# Patient Record
Sex: Female | Born: 1950
Health system: Southern US, Community
[De-identification: ages and names within clinical notes are randomized; demographics above are authoritative.]

## PROBLEM LIST (undated history)

## (undated) DIAGNOSIS — K5730 Diverticulosis of large intestine without perforation or abscess without bleeding: Secondary | ICD-10-CM

## (undated) DIAGNOSIS — F411 Generalized anxiety disorder: Secondary | ICD-10-CM

## (undated) DIAGNOSIS — R7989 Other specified abnormal findings of blood chemistry: Secondary | ICD-10-CM

## (undated) DIAGNOSIS — I341 Nonrheumatic mitral (valve) prolapse: Secondary | ICD-10-CM

## (undated) DIAGNOSIS — M2550 Pain in unspecified joint: Secondary | ICD-10-CM

## (undated) DIAGNOSIS — F3289 Other specified depressive episodes: Secondary | ICD-10-CM

## (undated) DIAGNOSIS — G4700 Insomnia, unspecified: Secondary | ICD-10-CM

## (undated) DIAGNOSIS — E7800 Pure hypercholesterolemia, unspecified: Secondary | ICD-10-CM

## (undated) DIAGNOSIS — M545 Low back pain, unspecified: Secondary | ICD-10-CM

## (undated) DIAGNOSIS — M1990 Unspecified osteoarthritis, unspecified site: Secondary | ICD-10-CM

## (undated) DIAGNOSIS — S82409A Unspecified fracture of shaft of unspecified fibula, initial encounter for closed fracture: Secondary | ICD-10-CM

## (undated) DIAGNOSIS — F329 Major depressive disorder, single episode, unspecified: Secondary | ICD-10-CM

## (undated) DIAGNOSIS — R609 Edema, unspecified: Secondary | ICD-10-CM

## (undated) DIAGNOSIS — M7660 Achilles tendinitis, unspecified leg: Secondary | ICD-10-CM

## (undated) DIAGNOSIS — K279 Peptic ulcer, site unspecified, unspecified as acute or chronic, without hemorrhage or perforation: Secondary | ICD-10-CM

## (undated) DIAGNOSIS — M797 Fibromyalgia: Secondary | ICD-10-CM

## (undated) DIAGNOSIS — H8109 Meniere's disease, unspecified ear: Secondary | ICD-10-CM

## (undated) HISTORY — DX: Unspecified fracture of shaft of unspecified fibula, initial encounter for closed fracture: S82.409A

## (undated) HISTORY — DX: Other specified depressive episodes: F32.89

## (undated) HISTORY — DX: Major depressive disorder, single episode, unspecified: F32.9

## (undated) HISTORY — DX: Generalized anxiety disorder: F41.1

## (undated) HISTORY — PX: SPINE SURGERY: SHX786

## (undated) HISTORY — DX: Insomnia, unspecified: G47.00

## (undated) HISTORY — DX: Meniere's disease, unspecified ear: H81.09

## (undated) HISTORY — DX: Pure hypercholesterolemia, unspecified: E78.00

## (undated) HISTORY — PX: SALPINGECTOMY: SHX328

## (undated) HISTORY — DX: Edema, unspecified: R60.9

## (undated) HISTORY — DX: Other specified abnormal findings of blood chemistry: R79.89

## (undated) HISTORY — PX: BACK SURGERY: SHX140

## (undated) HISTORY — DX: Low back pain: M54.5

## (undated) HISTORY — DX: Unspecified osteoarthritis, unspecified site: M19.90

## (undated) HISTORY — DX: Pain in unspecified joint: M25.50

## (undated) HISTORY — PX: TONSILLECTOMY: SUR1361

## (undated) HISTORY — DX: Diverticulosis of large intestine without perforation or abscess without bleeding: K57.30

## (undated) HISTORY — PX: OOPHORECTOMY: SHX86

## (undated) HISTORY — DX: Low back pain, unspecified: M54.50

## (undated) HISTORY — DX: Achilles tendinitis, unspecified leg: M76.60

## (undated) HISTORY — DX: Fibromyalgia: M79.7

## (undated) HISTORY — DX: Nonrheumatic mitral (valve) prolapse: I34.1

## (undated) HISTORY — DX: Peptic ulcer, site unspecified, unspecified as acute or chronic, without hemorrhage or perforation: K27.9

---

## 1980-03-08 HISTORY — PX: ABDOMINAL HYSTERECTOMY: SHX81

## 1984-03-08 DIAGNOSIS — H8109 Meniere's disease, unspecified ear: Secondary | ICD-10-CM

## 1984-03-08 HISTORY — DX: Meniere's disease, unspecified ear: H81.09

## 2004-03-08 LAB — HM COLONOSCOPY

## 2005-11-11 ENCOUNTER — Ambulatory Visit: Payer: Self-pay | Admitting: Internal Medicine

## 2005-11-16 ENCOUNTER — Ambulatory Visit: Payer: Self-pay | Admitting: Internal Medicine

## 2006-01-11 ENCOUNTER — Ambulatory Visit: Payer: Self-pay | Admitting: General Surgery

## 2007-03-26 ENCOUNTER — Encounter: Admission: RE | Admit: 2007-03-26 | Discharge: 2007-03-26 | Payer: Self-pay | Admitting: Neurological Surgery

## 2007-05-09 ENCOUNTER — Observation Stay (HOSPITAL_COMMUNITY): Admission: RE | Admit: 2007-05-09 | Discharge: 2007-05-10 | Payer: Self-pay | Admitting: Neurological Surgery

## 2008-06-12 ENCOUNTER — Ambulatory Visit: Payer: Self-pay | Admitting: Otolaryngology

## 2009-03-08 LAB — HM PAP SMEAR

## 2010-02-26 ENCOUNTER — Emergency Department: Payer: Self-pay | Admitting: Emergency Medicine

## 2010-07-24 NOTE — Op Note (Signed)
NAMESOLACE, MANWARREN NO.:  [id number]   MEDICAL RECORD NO.:  [id number]          PATIENT TYPE:  OBV   LOCATION:  3534                         FACILITY:  [facility]   PHYSICIAN:  Stefani Dama, M.D.  DATE OF BIRTH:  1950/09/27   DATE OF PROCEDURE:  05/25/2007  DATE OF DISCHARGE:  05/10/2007                               OPERATIVE REPORT   PREOPERATIVE DIAGNOSIS:  Cervical spondylosis with myelopathy and  cervical radiculopathy C5-C6.   POSTOPERATIVE DIAGNOSIS:  Cervical spondylosis with myelopathy and  cervical radiculopathy C5-C6.   PROCEDURE:  Anterior cervical decompression C5-C6, arthrodesis with  structural allograft and Alphatec plate fixation with a Tressel plate.   SURGEON:  Stefani Dama, M.D.   FIRST ASSISTANT:  Payton Doughty, M.D.   ANESTHESIA:  General endotracheal.   INDICATIONS:  Tracy Estrada is a 60 year old individual who has had  significant problems with neck, shoulder and arm pain and weakness in  the proximal upper extremities and the biceps. She was found to have a  large extruded fragment of disc material at C5-C6 causing spinal cord  compromise and some intrinsic cord changes.  She was advised regarding  the need for urgent surgery.   DESCRIPTION OF PROCEDURE:  The patient was brought to the operating room  and placed on the table in a supine position. After the smooth induction  of general endotracheal anesthesia, she was placed in 5 pounds of halter  traction and the neck was then prepped with alcohol and DuraPrep and  draped in a sterile fashion.  An incision was made on the left side in  the transverse plane.  Dissection was carried down through the platysma.  The plane between the sternocleidomastoid and strap muscles was  dissected bluntly until the first prevertebral space was reached.  This  was confirmed on radiograph as being C5-C6. Then, by dissecting the  longus coli muscle off either side of the midline, self-retaining  Caspar  retractor was placed in the wound and a discectomy was performed using a  15 blade to open the anterior longitudinal ligament and a combination of  curettes and rongeurs to remove significant quantity of severely  degenerated and desiccated material.  As the region of the posterior  longitudinal ligament was reached, there was noted to be a breech in the  ligament which was thickened and this breech was taken up with a 2 mm  Kerrison punch and yielded some significant free fragments of disc  material from the central portion of the canal. Dissection was carried  out laterally and some uncinate spurring was encountered, more on the  right side than on the left side.  This was removed. Dissection was  carried out completely to release any ingrowth onto the exiting nerve  roots at C6 at this level.  Once both nerve roots were well  decompressed, hemostasis in the soft tissues and the prevertebral space  was achieved with some small pledgets of Gelfoam soaked in thrombin  which were later irrigated away. The endplates of the disc space were  then smoothed with a 5 mm oval  bur.  Once this was prepared, then an 8  mm transgraft was prepared by shaving the ends to appropriate size and  dimension and filling the interspace with some demineralized bone  matrix. The graft was then inserted into the interspace.  Traction was  relieved and then a small 16 mm plate was affixed to the ventral aspect  of the vertebral bodies with four locking 4 x 14 mm variable angle  screws.  Hemostasis in the soft tissues was obtained.  The area was  irrigated copiously with antibiotic  irrigating solution.  The platysma was closed with 3-0 Vicryl in an  interrupted fashion, 3-0 Vicryl was used subcuticularly to close the  skin.  The patient tolerated the procedure well and was returned to the  recovery room in stable condition.      Stefani Dama, M.D.  Electronically Signed     HJE/MEDQ  D:   05/25/2007  T:  05/25/2007  Job:  [id number]

## 2010-11-27 LAB — CBC
HCT: 41.5
Hemoglobin: 14.2
MCHC: 34.2
MCV: 97.1
RBC: 4.27
RDW: 12

## 2010-11-30 LAB — C-REACTIVE PROTEIN: CRP: 0.3 — ABNORMAL LOW (ref <0.6)

## 2011-01-14 ENCOUNTER — Ambulatory Visit: Payer: Self-pay | Admitting: Family Medicine

## 2011-09-13 LAB — HM MAMMOGRAPHY: HM Mammogram: NEGATIVE

## 2011-11-30 ENCOUNTER — Encounter: Payer: Self-pay | Admitting: *Deleted

## 2011-11-30 ENCOUNTER — Encounter: Payer: Self-pay | Admitting: Family Medicine

## 2013-06-06 DIAGNOSIS — M1710 Unilateral primary osteoarthritis, unspecified knee: Secondary | ICD-10-CM | POA: Diagnosis not present

## 2013-06-06 DIAGNOSIS — IMO0002 Reserved for concepts with insufficient information to code with codable children: Secondary | ICD-10-CM | POA: Diagnosis not present

## 2013-06-11 DIAGNOSIS — E559 Vitamin D deficiency, unspecified: Secondary | ICD-10-CM | POA: Diagnosis not present

## 2013-06-11 DIAGNOSIS — M1710 Unilateral primary osteoarthritis, unspecified knee: Secondary | ICD-10-CM | POA: Diagnosis not present

## 2013-06-11 DIAGNOSIS — M461 Sacroiliitis, not elsewhere classified: Secondary | ICD-10-CM | POA: Diagnosis not present

## 2013-06-11 DIAGNOSIS — R5383 Other fatigue: Secondary | ICD-10-CM | POA: Diagnosis not present

## 2013-06-11 DIAGNOSIS — IMO0001 Reserved for inherently not codable concepts without codable children: Secondary | ICD-10-CM | POA: Diagnosis not present

## 2013-06-11 DIAGNOSIS — Z79899 Other long term (current) drug therapy: Secondary | ICD-10-CM | POA: Diagnosis not present

## 2013-06-11 DIAGNOSIS — R5381 Other malaise: Secondary | ICD-10-CM | POA: Diagnosis not present

## 2013-06-16 ENCOUNTER — Ambulatory Visit (INDEPENDENT_AMBULATORY_CARE_PROVIDER_SITE_OTHER): Payer: Medicare Other | Admitting: Family Medicine

## 2013-06-16 VITALS — BP 118/62 | HR 70 | Temp 98.0°F | Resp 18 | Ht 61.0 in | Wt 125.8 lb

## 2013-06-16 DIAGNOSIS — R102 Pelvic and perineal pain: Secondary | ICD-10-CM

## 2013-06-16 DIAGNOSIS — N949 Unspecified condition associated with female genital organs and menstrual cycle: Secondary | ICD-10-CM

## 2013-06-16 DIAGNOSIS — R55 Syncope and collapse: Secondary | ICD-10-CM | POA: Diagnosis not present

## 2013-06-16 DIAGNOSIS — R110 Nausea: Secondary | ICD-10-CM | POA: Diagnosis not present

## 2013-06-16 DIAGNOSIS — R300 Dysuria: Secondary | ICD-10-CM | POA: Diagnosis not present

## 2013-06-16 LAB — POCT CBC
GRANULOCYTE PERCENT: 63 % (ref 37–80)
HCT, POC: 40.4 % (ref 37.7–47.9)
Hemoglobin: 12.8 g/dL (ref 12.2–16.2)
Lymph, poc: 1.3 (ref 0.6–3.4)
MCH: 32 pg — AB (ref 27–31.2)
MCHC: 31.7 g/dL — AB (ref 31.8–35.4)
MCV: 101 fL — AB (ref 80–97)
MID (CBC): 0.3 (ref 0–0.9)
MPV: 10.4 fL (ref 0–99.8)
PLATELET COUNT, POC: 188 10*3/uL (ref 142–424)
POC Granulocyte: 2.8 (ref 2–6.9)
POC LYMPH PERCENT: 29.4 %L (ref 10–50)
POC MID %: 7.6 % (ref 0–12)
RBC: 4 M/uL — AB (ref 4.04–5.48)
RDW, POC: 12.9 %
WBC: 4.4 10*3/uL — AB (ref 4.6–10.2)

## 2013-06-16 LAB — POCT URINALYSIS DIPSTICK
Bilirubin, UA: NEGATIVE
Blood, UA: NEGATIVE
Glucose, UA: NEGATIVE
Ketones, UA: NEGATIVE
Nitrite, UA: NEGATIVE
PH UA: 5.5
PROTEIN UA: NEGATIVE
UROBILINOGEN UA: 0.2

## 2013-06-16 LAB — POCT UA - MICROSCOPIC ONLY
CASTS, UR, LPF, POC: NEGATIVE
CRYSTALS, UR, HPF, POC: NEGATIVE
Mucus, UA: POSITIVE
YEAST UA: NEGATIVE

## 2013-06-16 LAB — COMPREHENSIVE METABOLIC PANEL
ALK PHOS: 39 U/L (ref 39–117)
ALT: 15 U/L (ref 0–35)
AST: 16 U/L (ref 0–37)
Albumin: 4.1 g/dL (ref 3.5–5.2)
BILIRUBIN TOTAL: 0.5 mg/dL (ref 0.2–1.2)
BUN: 18 mg/dL (ref 6–23)
CO2: 28 meq/L (ref 19–32)
CREATININE: 0.63 mg/dL (ref 0.50–1.10)
Calcium: 9.9 mg/dL (ref 8.4–10.5)
Chloride: 103 mEq/L (ref 96–112)
Glucose, Bld: 77 mg/dL (ref 70–99)
Potassium: 4.3 mEq/L (ref 3.5–5.3)
SODIUM: 139 meq/L (ref 135–145)
TOTAL PROTEIN: 6.4 g/dL (ref 6.0–8.3)

## 2013-06-16 LAB — GLUCOSE, POCT (MANUAL RESULT ENTRY): POC Glucose: 77 mg/dl (ref 70–99)

## 2013-06-16 MED ORDER — CIPROFLOXACIN HCL 500 MG PO TABS: 500.0000 mg | ORAL_TABLET | Freq: Two times a day (BID) | ORAL | Status: DC

## 2013-06-16 NOTE — Patient Instructions (Signed)
Syncope  Syncope is a fainting spell. This means the person loses consciousness and drops to the ground. The person is generally unconscious for less than 5 minutes. The person may have some muscle twitches for up to 15 seconds before waking up and returning to normal. Syncope occurs more often in elderly people, but it can happen to anyone. While most causes of syncope are not dangerous, syncope can be a sign of a serious medical problem. It is important to seek medical care.   CAUSES   Syncope is caused by a sudden decrease in blood flow to the brain. The specific cause is often not determined. Factors that can trigger syncope include:   Taking medicines that lower blood pressure.   Sudden changes in posture, such as standing up suddenly.   Taking more medicine than prescribed.   Standing in one place for too long.   Seizure disorders.   Dehydration and excessive exposure to heat.   Low blood sugar (hypoglycemia).   Straining to have a bowel movement.   Heart disease, irregular heartbeat, or other circulatory problems.   Fear, emotional distress, seeing blood, or severe pain.  SYMPTOMS   Right before fainting, you may:   Feel dizzy or lightheaded.   Feel nauseous.   See all white or all black in your field of vision.   Have cold, clammy skin.  DIAGNOSIS   Your caregiver will ask about your symptoms, perform a physical exam, and perform electrocardiography (ECG) to record the electrical activity of your heart. Your caregiver may also perform other heart or blood tests to determine the cause of your syncope.  TREATMENT   In most cases, no treatment is needed. Depending on the cause of your syncope, your caregiver may recommend changing or stopping some of your medicines.  HOME CARE INSTRUCTIONS   Have someone stay with you until you feel stable.   Do not drive, operate machinery, or play sports until your caregiver says it is okay.   Keep all follow-up appointments as directed by your  caregiver.   Lie down right away if you start feeling like you might faint. Breathe deeply and steadily. Wait until all the symptoms have passed.   Drink enough fluids to keep your urine clear or pale yellow.   If you are taking blood pressure or heart medicine, get up slowly, taking several minutes to sit and then stand. This can reduce dizziness.  SEEK IMMEDIATE MEDICAL CARE IF:    You have a severe headache.   You have unusual pain in the chest, abdomen, or back.   You are bleeding from the mouth or rectum, or you have black or tarry stool.   You have an irregular or very fast heartbeat.   You have pain with breathing.   You have repeated fainting or seizure-like jerking during an episode.   You faint when sitting or lying down.   You have confusion.   You have difficulty walking.   You have severe weakness.   You have vision problems.  If you fainted, call your local emergency services (911 in [place] Do not drive yourself to the hospital.   MAKE SURE YOU:   Understand these instructions.   Will watch your condition.   Will get help right away if you are not doing well or get worse.  Document Released: 02/22/2005 Document Revised: 08/24/2011 Document Reviewed: 04/23/2011  [employer] Patient Information 2014 [employer].

## 2013-06-16 NOTE — Progress Notes (Addendum)
Subjective:    Patient ID: Breland Elders, female    DOB: 1950-10-16, 63 y.o.   MRN: [id number]  Loss of Consciousness Associated symptoms include headaches. Pertinent negatives include no abdominal pain, chest pain, diaphoresis, dizziness, fever, light-headedness, nausea, palpitations, vomiting or weakness.  Urinary Frequency  Associated symptoms include frequency. Pertinent negatives include no chills, hematuria, nausea, urgency or vomiting.   Chief Complaint  Patient presents with  . Loss of Consciousness  . Urinary Frequency    burning and odor   This chart was scribed for Wardell Honour, MD by Thea Alken, ED Scribe. This patient was seen in room 14 and the patient's care was started at 2:34 PM.  HPI Comments: Bethany Hirt is a 63 y.o. female who presents to the [facility] complaining of syncope onset 2 day ago. Pt reports that she was using the restroom when she past out. Pt reports that she felt fine prior to syncope episode. Pt reports HA and wooziness prior to syncope episode. She denies hitting her head. Pt denies dizziness, numbness, tingling, CP, SOB, or nausea prior/post syncope episode. Pt denies congestion and sinus pressure. Pt was driven to clinic by husband.  Pt reports that she has suffered syncope in the past.  Pt also complains of a UTI with associted minor dysuria onset 1 month. Pt reports left groin pain onset 2 weeks ago. Pt reports that her urine has an odor. Pt denies blood in urine. Pt reports that she has minor vaginal discharge. Pt states that she has about 3 BM a day.   Pt has h/o hysterectomy. Pt reports she stills has her ovaries. Pt reports mother is 69. Pt reports that her mother has difficulty walking. She denies her mother having HTN. Pt reports father died at 55 from MI. Pt reports that he passed in 1995 and had his first MI in 1969. Pt reports sister who passed from breast cancer. Pt denies brother. Pt denies smoking but states she is  a social drinker.     Past Medical History  Diagnosis Date  . Lumbago   . Edema   . Pure hypercholesterolemia   . Closed fracture of unspecified part of fibula   . Insomnia, unspecified   . Anxiety state, unspecified   . Depressive disorder, not elsewhere classified   . Achilles bursitis or tendinitis   . Pain in joint, site unspecified   . MVP (mitral valve prolapse)   . Fibromyalgia   . Peptic ulcer     no testing  . Arthritis   . Meniere's disease 1986  . Diverticulosis of colon (without mention of hemorrhage)   . TSH elevation    Allergies  Allergen Reactions  . Demerol [Meperidine] Nausea And Vomiting   Prior to Admission medications   Medication Sig Start Date End Date Taking? Authorizing Provider  buPROPion (WELLBUTRIN XL) 150 MG 24 hr tablet Take 150 mg by mouth daily.   Yes Historical Provider, MD  calcium-vitamin D (OSCAL WITH D) 500-200 MG-UNIT per tablet Take 2 tablets by mouth daily.   Yes Historical Provider, MD  cholecalciferol (VITAMIN D) 1000 UNITS tablet Take 1,000 Units by mouth 2 (two) times daily.   Yes Historical Provider, MD  cyclobenzaprine (FLEXERIL) 10 MG tablet Take 10 mg by mouth daily.   Yes Historical Provider, MD  diclofenac sodium (VOLTAREN) 1 % GEL Apply topically 4 (four) times daily.   Yes Historical Provider, MD  etodolac (LODINE) 300 MG capsule Take 300 mg  by mouth every 8 (eight) hours.   Yes Historical Provider, MD  fluticasone (FLONASE) 50 MCG/ACT nasal spray Place 2 sprays into the nose daily.   Yes Historical Provider, MD  Ginger, Zingiber officinalis, (GINGER ROOT) 500 MG CAPS Take 500 mg by mouth.   Yes Historical Provider, MD  meloxicam (MOBIC) 15 MG tablet Take 15 mg by mouth daily.   Yes Historical Provider, MD  Misc Natural Products (TART CHERRY ADVANCED PO) Take by mouth.   Yes Historical Provider, MD  Multiple Vitamins-Minerals (MULTIVITAMIN WITH MINERALS) tablet Take 1 tablet by mouth daily.   Yes Historical Provider, MD    Omega-3 Fatty Acids (FISH OIL) 1000 MG CAPS Take 1,000 mg by mouth.   Yes Historical Provider, MD  omeprazole (PRILOSEC) 40 MG capsule Take 40 mg by mouth daily.   Yes Historical Provider, MD  traZODone (DESYREL) 50 MG tablet Take 50 mg by mouth at bedtime.   Yes Historical Provider, MD   Review of Systems  Constitutional: Negative for fever, chills, diaphoresis, activity change, appetite change, fatigue and unexpected weight change.  HENT: Negative for congestion, ear pain, rhinorrhea, sinus pressure and sore throat.   Eyes: Negative for photophobia and visual disturbance.  Respiratory: Negative for cough, shortness of breath, wheezing and stridor.   Cardiovascular: Positive for syncope. Negative for chest pain, palpitations and leg swelling.  Gastrointestinal: Negative for nausea, vomiting, abdominal pain, diarrhea, constipation, blood in stool, abdominal distention, anal bleeding and rectal pain.  Genitourinary: Positive for dysuria, frequency and vaginal discharge. Negative for urgency, hematuria, vaginal bleeding and vaginal pain.  Skin: Negative for color change and rash.  Neurological: Positive for syncope and headaches. Negative for dizziness, tremors, seizures, facial asymmetry, speech difficulty, weakness, light-headedness and numbness.      Objective:   Physical Exam  Constitutional: She is oriented to person, place, and time. She appears well-developed and well-nourished. No distress.  HENT:  Head: Normocephalic and atraumatic.  Right Ear: External ear normal.  Left Ear: External ear normal.  Nose: Nose normal.  Mouth/Throat: Oropharynx is clear and moist.  Eyes: Conjunctivae and EOM are normal. Pupils are equal, round, and reactive to light.  Neck: Normal range of motion. Neck supple. Carotid bruit is not present. No thyromegaly present.  Cardiovascular: Normal rate, regular rhythm, normal heart sounds and intact distal pulses.  Exam reveals no gallop and no friction rub.    No murmur heard. Pulmonary/Chest: Effort normal and breath sounds normal. No respiratory distress. She has no wheezes. She has no rales.  Abdominal: Soft. Bowel sounds are normal. She exhibits no distension and no mass. There is tenderness in the suprapubic area and left lower quadrant. There is no rebound and no guarding.  Genitourinary: There is no rash, tenderness or lesion on the right labia. There is no rash, tenderness or lesion on the left labia. Right adnexum displays no mass, no tenderness and no fullness. Left adnexum displays no mass, no tenderness and no fullness. Vaginal discharge found.  Very minimal vaginal discharge.  Musculoskeletal: Normal range of motion.  Lymphadenopathy:    She has no cervical adenopathy.  Neurological: She is alert and oriented to person, place, and time. She has normal reflexes. No cranial nerve deficit. She exhibits normal muscle tone. Coordination normal.  Skin: Skin is warm and dry. No rash noted. She is not diaphoretic. No erythema. No pallor.  Psychiatric: She has a normal mood and affect. Her behavior is normal. Judgment and thought content normal.  Nursing note and  vitals reviewed.  Results for orders placed in visit on 06/16/13  POCT CBC      Result Value Ref Range   WBC 4.4 (*) 4.6 - 10.2 K/uL   Lymph, poc 1.3  0.6 - 3.4   POC LYMPH PERCENT 29.4  10 - 50 %L   MID (cbc) 0.3  0 - 0.9   POC MID % 7.6  0 - 12 %M   POC Granulocyte 2.8  2 - 6.9   Granulocyte percent 63.0  37 - 80 %G   RBC 4.00 (*) 4.04 - 5.48 M/uL   Hemoglobin 12.8  12.2 - 16.2 g/dL   HCT, POC 40.4  37.7 - 47.9 %   MCV 101.0 (*) 80 - 97 fL   MCH, POC 32.0 (*) 27 - 31.2 pg   MCHC 31.7 (*) 31.8 - 35.4 g/dL   RDW, POC 12.9     Platelet Count, POC 188  142 - 424 K/uL   MPV 10.4  0 - 99.8 fL  GLUCOSE, POCT (MANUAL RESULT ENTRY)      Result Value Ref Range   POC Glucose 77  70 - 99 mg/dl  POCT URINALYSIS DIPSTICK      Result Value Ref Range   Color, UA yellow     Clarity,  UA slightly cloudy     Glucose, UA neg     Bilirubin, UA neg     Ketones, UA neg     Spec Grav, UA >=1.030     Blood, UA neg     pH, UA 5.5     Protein, UA neg     Urobilinogen, UA 0.2     Nitrite, UA neg     Leukocytes, UA Trace    POCT UA - MICROSCOPIC ONLY      Result Value Ref Range   WBC, Ur, HPF, POC 4-8     RBC, urine, microscopic 2-3     Bacteria, U Microscopic 4+     Mucus, UA pos     Epithelial cells, urine per micros 1-2     Crystals, Ur, HPF, POC neg     Casts, Ur, LPF, POC neg     Yeast, UA neg     EKG: NSR; no arrhythmia; no ST changes.    Assessment & Plan:    1. Syncope   2. Dysuria   3. Nausea alone   4. Acute pelvic pain, female     1. Syncopal event:  New. No associated symptom; most consistent with vasovagal event.  Due to age, refer to cardiology to rule out arrhythmia or primary cardiac etiology.  To ED for recurrence.   Encourage hydration due to concentrated urine. 2.  Dysuria: New. Send urine culture; treat with Cipro while awaiting results. 3.  Nausea: New.  Obtain labs; BRAT diet, hydration.  If persists or worsens, RTC. 4.   Pelvic pain: New.  Associated with dysuria; contact office if persists after treating UTI.   Meds ordered this encounter  Medications  . Misc Natural Products (TART CHERRY ADVANCED PO)    Sig: Take by mouth.  Marland Kitchen omeprazole (PRILOSEC) 40 MG capsule    Sig: Take 40 mg by mouth daily.  . diclofenac sodium (VOLTAREN) 1 % GEL    Sig: Apply topically 4 (four) times daily.  Marland Kitchen etodolac (LODINE) 300 MG capsule    Sig: Take 300 mg by mouth every 8 (eight) hours.  . Ginger, Zingiber officinalis, (GINGER ROOT) 500 MG CAPS  Sig: Take 500 mg by mouth.  . Omega-3 Fatty Acids (FISH OIL) 1000 MG CAPS    Sig: Take 1,000 mg by mouth.  . ciprofloxacin (CIPRO) 500 MG tablet    Sig: Take 1 tablet (500 mg total) by mouth 2 (two) times daily.    Dispense:  20 tablet    Refill:  0   I personally performed the services described in  this documentation, which was scribed in my presence.  The recorded information has been reviewed and is accurate.  [name], M.D.  [facility] [address] [place], [place]  [address] [phone] phone [phone] fax

## 2013-06-19 LAB — URINE CULTURE: Colony Count: >=100000

## 2013-06-21 ENCOUNTER — Encounter: Payer: Self-pay | Admitting: Family Medicine

## 2013-06-21 DIAGNOSIS — R55 Syncope and collapse: Secondary | ICD-10-CM | POA: Diagnosis not present

## 2013-06-22 ENCOUNTER — Telehealth: Payer: Self-pay | Admitting: Family Medicine

## 2013-06-22 NOTE — Telephone Encounter (Signed)
Patient states that MyChart indicates that she overdue for a shingles shot however patient states that she had that done in July 2013 (former [place] patient). Please update if necessary.

## 2013-06-22 NOTE — Telephone Encounter (Signed)
Updated Zostavax in patient's chart.

## 2013-07-09 DIAGNOSIS — R079 Chest pain, unspecified: Secondary | ICD-10-CM | POA: Diagnosis not present

## 2013-07-10 DIAGNOSIS — R079 Chest pain, unspecified: Secondary | ICD-10-CM | POA: Diagnosis not present

## 2013-09-11 ENCOUNTER — Encounter: Payer: Self-pay | Admitting: Family Medicine

## 2013-09-11 MED ORDER — BUPROPION HCL ER (XL) 150 MG PO TB24: 150.0000 mg | ORAL_TABLET | Freq: Every day | ORAL | Status: DC

## 2013-10-29 DIAGNOSIS — L821 Other seborrheic keratosis: Secondary | ICD-10-CM | POA: Diagnosis not present

## 2013-10-29 DIAGNOSIS — D485 Neoplasm of uncertain behavior of skin: Secondary | ICD-10-CM | POA: Diagnosis not present

## 2013-10-29 DIAGNOSIS — L570 Actinic keratosis: Secondary | ICD-10-CM | POA: Diagnosis not present

## 2013-11-19 DIAGNOSIS — Z23 Encounter for immunization: Secondary | ICD-10-CM | POA: Diagnosis not present

## 2014-01-08 DIAGNOSIS — M25562 Pain in left knee: Secondary | ICD-10-CM | POA: Diagnosis not present

## 2014-01-08 DIAGNOSIS — E559 Vitamin D deficiency, unspecified: Secondary | ICD-10-CM | POA: Diagnosis not present

## 2014-01-08 DIAGNOSIS — Z79899 Other long term (current) drug therapy: Secondary | ICD-10-CM | POA: Diagnosis not present

## 2014-01-08 DIAGNOSIS — M797 Fibromyalgia: Secondary | ICD-10-CM | POA: Diagnosis not present

## 2014-01-08 DIAGNOSIS — M170 Bilateral primary osteoarthritis of knee: Secondary | ICD-10-CM | POA: Diagnosis not present

## 2014-01-08 DIAGNOSIS — M25561 Pain in right knee: Secondary | ICD-10-CM | POA: Diagnosis not present

## 2014-01-08 DIAGNOSIS — R5381 Other malaise: Secondary | ICD-10-CM | POA: Diagnosis not present

## 2014-01-08 DIAGNOSIS — R300 Dysuria: Secondary | ICD-10-CM | POA: Diagnosis not present

## 2014-02-07 ENCOUNTER — Ambulatory Visit (INDEPENDENT_AMBULATORY_CARE_PROVIDER_SITE_OTHER): Payer: Medicare Other

## 2014-02-07 ENCOUNTER — Ambulatory Visit (INDEPENDENT_AMBULATORY_CARE_PROVIDER_SITE_OTHER): Payer: Medicare Other | Admitting: Family Medicine

## 2014-02-07 ENCOUNTER — Encounter: Payer: Self-pay | Admitting: Family Medicine

## 2014-02-07 VITALS — BP 116/70 | HR 68 | Temp 98.1°F | Resp 16 | Ht 63.5 in | Wt 126.8 lb

## 2014-02-07 DIAGNOSIS — M545 Low back pain, unspecified: Secondary | ICD-10-CM

## 2014-02-07 DIAGNOSIS — L237 Allergic contact dermatitis due to plants, except food: Secondary | ICD-10-CM

## 2014-02-07 DIAGNOSIS — Z87440 Personal history of urinary (tract) infections: Secondary | ICD-10-CM

## 2014-02-07 DIAGNOSIS — Z1239 Encounter for other screening for malignant neoplasm of breast: Secondary | ICD-10-CM

## 2014-02-07 LAB — POCT UA - MICROSCOPIC ONLY
BACTERIA, U MICROSCOPIC: NEGATIVE
CRYSTALS, UR, HPF, POC: NEGATIVE
Casts, Ur, LPF, POC: NEGATIVE
MUCUS UA: NEGATIVE
RBC, URINE, MICROSCOPIC: NEGATIVE
WBC, UR, HPF, POC: NEGATIVE
Yeast, UA: NEGATIVE

## 2014-02-07 LAB — POCT URINALYSIS DIPSTICK
BILIRUBIN UA: NEGATIVE
Blood, UA: NEGATIVE
Glucose, UA: NEGATIVE
KETONES UA: NEGATIVE
LEUKOCYTES UA: NEGATIVE
Nitrite, UA: NEGATIVE
PH UA: 5.5
PROTEIN UA: NEGATIVE
SPEC GRAV UA: 1.02
Urobilinogen, UA: 0.2

## 2014-02-07 MED ORDER — CIPROFLOXACIN HCL 500 MG PO TABS: 500.0000 mg | ORAL_TABLET | Freq: Two times a day (BID) | ORAL | Status: DC

## 2014-02-07 MED ORDER — PREDNISONE 20 MG PO TABS: ORAL_TABLET | ORAL | Status: DC

## 2014-02-07 NOTE — Progress Notes (Signed)
Subjective:    Patient ID: Tracy Estrada, female    DOB: 07/28/1950, 63 y.o.   MRN: [id number]  02/07/2014  Rash and Back Pain   HPI This 63 y.o. female presents for evaluation of rash.  Developed two days ago.  B anterior neck, hands, forearms.    Lower back:  OA hands, knees, feet.  Has been taking AZO with improvement in lower back pain.  Onset one month ago.  Worse with movement and lying down at night.  No dysuria, urgency, hematuria, nocturia.  No fever/chill/sweats. +nausea but no vomiting.   No previous lumbar spine xrays by [facility].  Known fibromyalgia and known OA in multiple joints; concerned about DDD lumbar spine. Denies radiation into legs; no n/t/w.  No saddle paresthesias; no b/b dysfunction.  No recurrent syncopal events since last visit.  Requesting referral for  Mammogram.   Review of Systems  Constitutional: Negative for fever, chills, diaphoresis and fatigue.  Respiratory: Negative for cough and shortness of breath.   Gastrointestinal: Positive for nausea. Negative for vomiting, abdominal pain, diarrhea and constipation.  Genitourinary: Positive for flank pain. Negative for dysuria, urgency, frequency, hematuria, vaginal discharge, enuresis, difficulty urinating, genital sores, vaginal pain and dyspareunia.  Musculoskeletal: Positive for back pain.  Skin: Positive for rash.  Neurological: Negative for weakness and numbness.    Past Medical History  Diagnosis Date  . Lumbago   . Edema   . Pure hypercholesterolemia   . Closed fracture of unspecified part of fibula   . Insomnia, unspecified   . Anxiety state, unspecified   . Depressive disorder, not elsewhere classified   . Achilles bursitis or tendinitis   . Pain in joint, site unspecified   . MVP (mitral valve prolapse)   . Fibromyalgia   . Peptic ulcer     no testing  . Arthritis   . Meniere's disease 1986  . Diverticulosis of colon (without mention of hemorrhage)   . TSH elevation    Past  Surgical History  Procedure Laterality Date  . Abdominal hysterectomy  03/08/1980    fibroids; ovaries intact.   Allergies  Allergen Reactions  . Demerol [Meperidine] Nausea And Vomiting   Current Outpatient Prescriptions  Medication Sig Dispense Refill  . buPROPion (WELLBUTRIN XL) 150 MG 24 hr tablet Take 1 tablet (150 mg total) by mouth daily. 30 tablet 5  . calcium-vitamin D (OSCAL WITH D) 500-200 MG-UNIT per tablet Take 2 tablets by mouth daily.    . diclofenac sodium (VOLTAREN) 1 % GEL Apply topically 4 (four) times daily.    . fluticasone (FLONASE) 50 MCG/ACT nasal spray Place 2 sprays into the nose daily.    . Ginger, Zingiber officinalis, (GINGER ROOT) 500 MG CAPS Take 500 mg by mouth.    . Misc Natural Products (TART CHERRY ADVANCED PO) Take by mouth.    . Multiple Vitamins-Minerals (MULTIVITAMIN WITH MINERALS) tablet Take 1 tablet by mouth daily.    . Omega-3 Fatty Acids (FISH OIL) 1000 MG CAPS Take 1,000 mg by mouth.    . traZODone (DESYREL) 50 MG tablet Take 50 mg by mouth at bedtime.    . cholecalciferol (VITAMIN D) 1000 UNITS tablet Take 1,000 Units by mouth 2 (two) times daily.    . ciprofloxacin (CIPRO) 500 MG tablet Take 1 tablet (500 mg total) by mouth 2 (two) times daily. 14 tablet 0  . predniSONE (DELTASONE) 20 MG tablet Three tablets daily x 2 days, then two tablets daily x 5 days, then one  tablet daily x 3 days 19 tablet 0   No current facility-administered medications for this visit.       Objective:    BP 116/70 mmHg  Pulse 68  Temp(Src) 98.1 F (36.7 C) (Oral)  Resp 16  Ht 5' 3.5" (1.613 m)  Wt 126 lb 12.8 oz (57.516 kg)  BMI 22.11 kg/m2  SpO2 99% Physical Exam  Constitutional: She is oriented to person, place, and time. She appears well-developed and well-nourished. No distress.  HENT:  Head: Normocephalic and atraumatic.  Eyes: Conjunctivae are normal. Pupils are equal, round, and reactive to light.  Neck: Normal range of motion. Neck supple.    Cardiovascular: Normal rate, regular rhythm and normal heart sounds.  Exam reveals no gallop and no friction rub.   No murmur heard. Pulmonary/Chest: Effort normal and breath sounds normal. She has no wheezes. She has no rales.  Abdominal: Soft. Bowel sounds are normal. She exhibits no distension. There is no tenderness. There is no rebound.  Musculoskeletal:       Lumbar back: She exhibits normal range of motion, no tenderness, no bony tenderness, no pain and no spasm.  Lumbar spine: straight leg raises negative; toe and heel walking intact; marching intact.  Non-tender midline or paraspinal muscles B.  Neurological: She is alert and oriented to person, place, and time.  Skin: She is not diaphoretic.  Psychiatric: She has a normal mood and affect. Her behavior is normal.  Nursing note and vitals reviewed.  Results for orders placed or performed in visit on 02/07/14  POCT UA - Microscopic Only  Result Value Ref Range   WBC, Ur, HPF, POC neg    RBC, urine, microscopic neg    Bacteria, U Microscopic neg    Mucus, UA neg    Epithelial cells, urine per micros 0-2    Crystals, Ur, HPF, POC neg    Casts, Ur, LPF, POC neg    Yeast, UA neg   POCT urinalysis dipstick  Result Value Ref Range   Color, UA yellow    Clarity, UA clear    Glucose, UA neg    Bilirubin, UA neg    Ketones, UA neg    Spec Grav, UA 1.020    Blood, UA neg    pH, UA 5.5    Protein, UA neg    Urobilinogen, UA 0.2    Nitrite, UA neg    Leukocytes, UA Negative    [facility] reading (PRIMARY) by  Dr. Tamala Julian.  Lumbar spine: NAD      Assessment & Plan:   1. Bilateral low back pain without sciatica   2. Hx: UTI (urinary tract infection)   3. Breast cancer screening   4. Poison ivy dermatitis     1. B lower back pain: New.  Consistent with musculoskeletal etiology yet patient insists that AZO improved lower back pain.  Rx for Prednisone provided; home exercises provided to perform daily.    2.  History of UTI: with  back pain and improvement with AZO; treat with Cipro for seven days.  3.  Poison Ivy Dermatitis:  New. rx for Prednisone provided.  Supportive care with Benadryl qhs. 4.  Breast cancer screening: refer for mammogram.   Meds ordered this encounter  Medications  . predniSONE (DELTASONE) 20 MG tablet    Sig: Three tablets daily x 2 days, then two tablets daily x 5 days, then one tablet daily x 3 days    Dispense:  19 tablet  Refill:  0  . ciprofloxacin (CIPRO) 500 MG tablet    Sig: Take 1 tablet (500 mg total) by mouth 2 (two) times daily.    Dispense:  14 tablet    Refill:  0    No Follow-up on file.    [name], M.D.  [facility] [address] [place], [place]  [address] [phone] phone [phone] fax

## 2014-02-07 NOTE — Patient Instructions (Signed)
Low Back Sprain with Rehab  A sprain is an injury in which a ligament is torn. The ligaments of the lower back are vulnerable to sprains. However, they are strong and require great force to be injured. These ligaments are important for stabilizing the spinal column. Sprains are classified into three categories. Grade 1 sprains cause pain, but the tendon is not lengthened. Grade 2 sprains include a lengthened ligament, due to the ligament being stretched or partially ruptured. With grade 2 sprains there is still function, although the function may be decreased. Grade 3 sprains involve a complete tear of the tendon or muscle, and function is usually impaired. SYMPTOMS   Severe pain in the lower back.  Sometimes, a feeling of a "pop," "snap," or tear, at the time of injury.  Tenderness and sometimes swelling at the injury site.  Uncommonly, bruising (contusion) within 48 hours of injury.  Muscle spasms in the back. CAUSES  Low back sprains occur when a force is placed on the ligaments that is greater than they can handle. Common causes of injury include:  Performing a stressful act while off-balance.  Repetitive stressful activities that involve movement of the lower back.  Direct hit (trauma) to the lower back. RISK INCREASES WITH:  Contact sports (football, wrestling).  Collisions (major skiing accidents).  Sports that require throwing or lifting (baseball, weightlifting).  Sports involving twisting of the spine (gymnastics, diving, tennis, golf).  Poor strength and flexibility.  Inadequate protection.  Previous back injury or surgery (especially fusion). PREVENTION  Wear properly fitted and padded protective equipment.  Warm up and stretch properly before activity.  Allow for adequate recovery between workouts.  Maintain physical fitness:  Strength, flexibility, and endurance.  Cardiovascular fitness.  Maintain a healthy body weight. PROGNOSIS  If treated  properly, low back sprains usually heal with non-surgical treatment. The length of time for healing depends on the severity of the injury.  RELATED COMPLICATIONS   Recurring symptoms, resulting in a chronic problem.  Chronic inflammation and pain in the low back.  Delayed healing or resolution of symptoms, especially if activity is resumed too soon.  Prolonged impairment.  Unstable or arthritic joints of the low back. TREATMENT  Treatment first involves the use of ice and medicine, to reduce pain and inflammation. The use of strengthening and stretching exercises may help reduce pain with activity. These exercises may be performed at home or with a therapist. Severe injuries may require referral to a therapist for further evaluation and treatment, such as ultrasound. Your caregiver may advise that you wear a back brace or corset, to help reduce pain and discomfort. Often, prolonged bed rest results in greater harm then benefit. Corticosteroid injections may be recommended. However, these should be reserved for the most serious cases. It is important to avoid using your back when lifting objects. At night, sleep on your back on a firm mattress, with a pillow placed under your knees. If non-surgical treatment is unsuccessful, surgery may be needed.  MEDICATION   If pain medicine is needed, nonsteroidal anti-inflammatory medicines (aspirin and ibuprofen), or other minor pain relievers (acetaminophen), are often advised.  Do not take pain medicine for 7 days before surgery.  Prescription pain relievers may be given, if your caregiver thinks they are needed. Use only as directed and only as much as you need.  Ointments applied to the skin may be helpful.  Corticosteroid injections may be given by your caregiver. These injections should be reserved for the most serious cases,   because they may only be given a certain number of times. HEAT AND COLD  Cold treatment (icing) should be applied for 10  to 15 minutes every 2 to 3 hours for inflammation and pain, and immediately after activity that aggravates your symptoms. Use ice packs or an ice massage.  Heat treatment may be used before performing stretching and strengthening activities prescribed by your caregiver, physical therapist, or athletic trainer. Use a heat pack or a warm water soak. SEEK MEDICAL CARE IF:   Symptoms get worse or do not improve in 2 to 4 weeks, despite treatment.  You develop numbness or weakness in either leg.  You lose bowel or bladder function.  Any of the following occur after surgery: fever, increased pain, swelling, redness, drainage of fluids, or bleeding in the affected area.  New, unexplained symptoms develop. (Drugs used in treatment may produce side effects.) EXERCISES  RANGE OF MOTION (ROM) AND STRETCHING EXERCISES - Low Back Sprain Most people with lower back pain will find that their symptoms get worse with excessive bending forward (flexion) or arching at the lower back (extension). The exercises that will help resolve your symptoms will focus on the opposite motion.  Your physician, physical therapist or athletic trainer will help you determine which exercises will be most helpful to resolve your lower back pain. Do not complete any exercises without first consulting with your caregiver. Discontinue any exercises which make your symptoms worse, until you speak to your caregiver. If you have pain, numbness or tingling which travels down into your buttocks, leg or foot, the goal of the therapy is for these symptoms to move closer to your back and eventually resolve. Sometimes, these leg symptoms will get better, but your lower back pain may worsen. This is often an indication of progress in your rehabilitation. Be very alert to any changes in your symptoms and the activities in which you participated in the 24 hours prior to the change. Sharing this information with your caregiver will allow him or her to  most efficiently treat your condition. These exercises may help you when beginning to rehabilitate your injury. Your symptoms may resolve with or without further involvement from your physician, physical therapist or athletic trainer. While completing these exercises, remember:   Restoring tissue flexibility helps normal motion to return to the joints. This allows healthier, less painful movement and activity.  An effective stretch should be held for at least 30 seconds.  A stretch should never be painful. You should only feel a gentle lengthening or release in the stretched tissue. FLEXION RANGE OF MOTION AND STRETCHING EXERCISES: STRETCH - Flexion, Single Knee to Chest   Lie on a firm bed or floor with both legs extended in front of you.  Keeping one leg in contact with the floor, bring your opposite knee to your chest. Hold your leg in place by either grabbing behind your thigh or at your knee.  Pull until you feel a gentle stretch in your low back. Hold __________ seconds.  Slowly release your grasp and repeat the exercise with the opposite side. Repeat __________ times. Complete this exercise __________ times per day.  STRETCH - Flexion, Double Knee to Chest  Lie on a firm bed or floor with both legs extended in front of you.  Keeping one leg in contact with the floor, bring your opposite knee to your chest.  Tense your stomach muscles to support your back and then lift your other knee to your chest. Hold your legs   in place by either grabbing behind your thighs or at your knees.  Pull both knees toward your chest until you feel a gentle stretch in your low back. Hold __________ seconds.  Tense your stomach muscles and slowly return one leg at a time to the floor. Repeat __________ times. Complete this exercise __________ times per day.  STRETCH - Low Trunk Rotation  Lie on a firm bed or floor. Keeping your legs in front of you, bend your knees so they are both pointed toward the  ceiling and your feet are flat on the floor.  Extend your arms out to the side. This will stabilize your upper body by keeping your shoulders in contact with the floor.  Gently and slowly drop both knees together to one side until you feel a gentle stretch in your low back. Hold for __________ seconds.  Tense your stomach muscles to support your lower back as you bring your knees back to the starting position. Repeat the exercise to the other side. Repeat __________ times. Complete this exercise __________ times per day  EXTENSION RANGE OF MOTION AND FLEXIBILITY EXERCISES: STRETCH - Extension, Prone on Elbows   Lie on your stomach on the floor, a bed will be too soft. Place your palms about shoulder width apart and at the height of your head.  Place your elbows under your shoulders. If this is too painful, stack pillows under your chest.  Allow your body to relax so that your hips drop lower and make contact more completely with the floor.  Hold this position for __________ seconds.  Slowly return to lying flat on the floor. Repeat __________ times. Complete this exercise __________ times per day.  RANGE OF MOTION - Extension, Prone Press Ups  Lie on your stomach on the floor, a bed will be too soft. Place your palms about shoulder width apart and at the height of your head.  Keeping your back as relaxed as possible, slowly straighten your elbows while keeping your hips on the floor. You may adjust the placement of your hands to maximize your comfort. As you gain motion, your hands will come more underneath your shoulders.  Hold this position __________ seconds.  Slowly return to lying flat on the floor. Repeat __________ times. Complete this exercise __________ times per day.  RANGE OF MOTION- Quadruped, Neutral Spine   Assume a hands and knees position on a firm surface. Keep your hands under your shoulders and your knees under your hips. You may place padding under your knees for  comfort.  Drop your head and point your tailbone toward the ground below you. This will round out your lower back like an angry cat. Hold this position for __________ seconds.  Slowly lift your head and release your tail bone so that your back sags into a large arch, like an old horse.  Hold this position for __________ seconds.  Repeat this until you feel limber in your low back.  Now, find your "sweet spot." This will be the most comfortable position somewhere between the two previous positions. This is your neutral spine. Once you have found this position, tense your stomach muscles to support your low back.  Hold this position for __________ seconds. Repeat __________ times. Complete this exercise __________ times per day.  STRENGTHENING EXERCISES - Low Back Sprain These exercises may help you when beginning to rehabilitate your injury. These exercises should be done near your "sweet spot." This is the neutral, low-back arch, somewhere between fully rounded   and fully arched, that is your least painful position. When performed in this safe range of motion, these exercises can be used for people who have either a flexion or extension based injury. These exercises may resolve your symptoms with or without further involvement from your physician, physical therapist or athletic trainer. While completing these exercises, remember:   Muscles can gain both the endurance and the strength needed for everyday activities through controlled exercises.  Complete these exercises as instructed by your physician, physical therapist or athletic trainer. Increase the resistance and repetitions only as guided.  You may experience muscle soreness or fatigue, but the pain or discomfort you are trying to eliminate should never worsen during these exercises. If this pain does worsen, stop and make certain you are following the directions exactly. If the pain is still present after adjustments, discontinue the  exercise until you can discuss the trouble with your caregiver. STRENGTHENING - Deep Abdominals, Pelvic Tilt   Lie on a firm bed or floor. Keeping your legs in front of you, bend your knees so they are both pointed toward the ceiling and your feet are flat on the floor.  Tense your lower abdominal muscles to press your low back into the floor. This motion will rotate your pelvis so that your tail bone is scooping upwards rather than pointing at your feet or into the floor. With a gentle tension and even breathing, hold this position for __________ seconds. Repeat __________ times. Complete this exercise __________ times per day.  STRENGTHENING - Abdominals, Crunches   Lie on a firm bed or floor. Keeping your legs in front of you, bend your knees so they are both pointed toward the ceiling and your feet are flat on the floor. Cross your arms over your chest.  Slightly tip your chin down without bending your neck.  Tense your abdominals and slowly lift your trunk high enough to just clear your shoulder blades. Lifting higher can put excessive stress on the lower back and does not further strengthen your abdominal muscles.  Control your return to the starting position. Repeat __________ times. Complete this exercise __________ times per day.  STRENGTHENING - Quadruped, Opposite UE/LE Lift   Assume a hands and knees position on a firm surface. Keep your hands under your shoulders and your knees under your hips. You may place padding under your knees for comfort.  Find your neutral spine and gently tense your abdominal muscles so that you can maintain this position. Your shoulders and hips should form a rectangle that is parallel with the floor and is not twisted.  Keeping your trunk steady, lift your right hand no higher than your shoulder and then your left leg no higher than your hip. Make sure you are not holding your breath. Hold this position for __________ seconds.  Continuing to keep  your abdominal muscles tense and your back steady, slowly return to your starting position. Repeat with the opposite arm and leg. Repeat __________ times. Complete this exercise __________ times per day.  STRENGTHENING - Abdominals and Quadriceps, Straight Leg Raise   Lie on a firm bed or floor with both legs extended in front of you.  Keeping one leg in contact with the floor, bend the other knee so that your foot can rest flat on the floor.  Find your neutral spine, and tense your abdominal muscles to maintain your spinal position throughout the exercise.  Slowly lift your straight leg off the floor about 6 inches for a count   of 15, making sure to not hold your breath.  Still keeping your neutral spine, slowly lower your leg all the way to the floor. Repeat this exercise with each leg __________ times. Complete this exercise __________ times per day. POSTURE AND BODY MECHANICS CONSIDERATIONS - Low Back Sprain Keeping correct posture when sitting, standing or completing your activities will reduce the stress put on different body tissues, allowing injured tissues a chance to heal and limiting painful experiences. The following are general guidelines for improved posture. Your physician or physical therapist will provide you with any instructions specific to your needs. While reading these guidelines, remember:  The exercises prescribed by your provider will help you have the flexibility and strength to maintain correct postures.  The correct posture provides the best environment for your joints to work. All of your joints have less wear and tear when properly supported by a spine with good posture. This means you will experience a healthier, less painful body.  Correct posture must be practiced with all of your activities, especially prolonged sitting and standing. Correct posture is as important when doing repetitive low-stress activities (typing) as it is when doing a single heavy-load  activity (lifting). RESTING POSITIONS Consider which positions are most painful for you when choosing a resting position. If you have pain with flexion-based activities (sitting, bending, stooping, squatting), choose a position that allows you to rest in a less flexed posture. You would want to avoid curling into a fetal position on your side. If your pain worsens with extension-based activities (prolonged standing, working overhead), avoid resting in an extended position such as sleeping on your stomach. Most people will find more comfort when they rest with their spine in a more neutral position, neither too rounded nor too arched. Lying on a non-sagging bed on your side with a pillow between your knees, or on your back with a pillow under your knees will often provide some relief. Keep in mind, being in any one position for a prolonged period of time, no matter how correct your posture, can still lead to stiffness. PROPER SITTING POSTURE In order to minimize stress and discomfort on your spine, you must sit with correct posture. Sitting with good posture should be effortless for a healthy body. Returning to good posture is a gradual process. Many people can work toward this most comfortably by using various supports until they have the flexibility and strength to maintain this posture on their own. When sitting with proper posture, your ears will fall over your shoulders and your shoulders will fall over your hips. You should use the back of the chair to support your upper back. Your lower back will be in a neutral position, just slightly arched. You may place a small pillow or folded towel at the base of your lower back for  support.  When working at a desk, create an environment that supports good, upright posture. Without extra support, muscles tire, which leads to excessive strain on joints and other tissues. Keep these recommendations in mind: CHAIR:  A chair should be able to slide under your desk  when your back makes contact with the back of the chair. This allows you to work closely.  The chair's height should allow your eyes to be level with the upper part of your monitor and your hands to be slightly lower than your elbows. BODY POSITION  Your feet should make contact with the floor. If this is not possible, use a foot rest.  Keep your   ears over your shoulders. This will reduce stress on your neck and low back. INCORRECT SITTING POSTURES  If you are feeling tired and unable to assume a healthy sitting posture, do not slouch or slump. This puts excessive strain on your back tissues, causing more damage and pain. Healthier options include:  Using more support, like a lumbar pillow.  Switching tasks to something that requires you to be upright or walking.  Talking a brief walk.  Lying down to rest in a neutral-spine position. PROLONGED STANDING WHILE SLIGHTLY LEANING FORWARD  When completing a task that requires you to lean forward while standing in one place for a long time, place either foot up on a stationary 2-4 inch high object to help maintain the best posture. When both feet are on the ground, the lower back tends to lose its slight inward curve. If this curve flattens (or becomes too large), then the back and your other joints will experience too much stress, tire more quickly, and can cause pain. CORRECT STANDING POSTURES Proper standing posture should be assumed with all daily activities, even if they only take a few moments, like when brushing your teeth. As in sitting, your ears should fall over your shoulders and your shoulders should fall over your hips. You should keep a slight tension in your abdominal muscles to brace your spine. Your tailbone should point down to the ground, not behind your body, resulting in an over-extended swayback posture.  INCORRECT STANDING POSTURES  Common incorrect standing postures include a forward head, locked knees and/or an excessive  swayback. WALKING Walk with an upright posture. Your ears, shoulders and hips should all line-up. PROLONGED ACTIVITY IN A FLEXED POSITION When completing a task that requires you to bend forward at your waist or lean over a low surface, try to find a way to stabilize 3 out of 4 of your limbs. You can place a hand or elbow on your thigh or rest a knee on the surface you are reaching across. This will provide you more stability, so that your muscles do not tire as quickly. By keeping your knees relaxed, or slightly bent, you will also reduce stress across your lower back. CORRECT LIFTING TECHNIQUES DO :  Assume a wide stance. This will provide you more stability and the opportunity to get as close as possible to the object which you are lifting.  Tense your abdominals to brace your spine. Bend at the knees and hips. Keeping your back locked in a neutral-spine position, lift using your leg muscles. Lift with your legs, keeping your back straight.  Test the weight of unknown objects before attempting to lift them.  Try to keep your elbows locked down at your sides in order get the best strength from your shoulders when carrying an object.  Always ask for help when lifting heavy or awkward objects. INCORRECT LIFTING TECHNIQUES DO NOT:   Lock your knees when lifting, even if it is a small object.  Bend and twist. Pivot at your feet or move your feet when needing to change directions.  Assume that you can safely pick up even a paperclip without proper posture. Document Released: 02/22/2005 Document Revised: 05/17/2011 Document Reviewed: 06/06/2008 [employer] Patient Information 2015 [employer]. This information is not intended to replace advice given to you by your health care provider. Make sure you discuss any questions you have with your health care provider.  

## 2014-02-11 NOTE — Progress Notes (Signed)
Was speaking to patient to schedule a CPE appt with Dr. Tamala Julian in June 2016 and got disconnected.  Called back but reached patient's VM.  Left message for pt to CB to schedule appt.

## 2014-02-12 ENCOUNTER — Encounter: Payer: Self-pay | Admitting: Family Medicine

## 2014-02-16 ENCOUNTER — Telehealth: Payer: Self-pay

## 2014-02-16 MED ORDER — BETAMETHASONE DIPROPIONATE 0.05 % EX OINT: TOPICAL_OINTMENT | Freq: Two times a day (BID) | CUTANEOUS | Status: DC

## 2014-02-16 NOTE — Telephone Encounter (Signed)
Pt notified that this has been sent to pharm.

## 2014-02-16 NOTE — Telephone Encounter (Signed)
Patient was recently here treated for posion ivy. Per patient it has cleared up some but she feels she may have re expose herself again. Stated she believes her dog has it as well and gave it to her again. Patient tried a cream her friend had and states it has helped her a lot. Patient is requesting this creme " Betamethesone biportianate .05%" to be sent to [facility]. Patients call back number is [phone]

## 2014-02-16 NOTE — Telephone Encounter (Signed)
Meds ordered this encounter  Medications  . betamethasone dipropionate (DIPROLENE) 0.05 % ointment    Sig: Apply topically 2 (two) times daily.    Dispense:  50 g    Refill:  0    Order Specific Question:  Supervising Provider    Answer:  DOOLITTLE, ROBERT P [[id number]]

## 2014-02-20 DIAGNOSIS — L239 Allergic contact dermatitis, unspecified cause: Secondary | ICD-10-CM | POA: Diagnosis not present

## 2014-03-14 ENCOUNTER — Other Ambulatory Visit: Payer: Self-pay | Admitting: Family Medicine

## 2014-03-14 NOTE — Telephone Encounter (Signed)
Dr Tamala Julian, you saw pt in Dec and had her sched a CPE in June, but I don't see depression/anx discussed. Do you want to RF until then?

## 2014-03-16 NOTE — Telephone Encounter (Signed)
Refill approved for six months.

## 2014-04-10 DIAGNOSIS — M170 Bilateral primary osteoarthritis of knee: Secondary | ICD-10-CM | POA: Diagnosis not present

## 2014-04-17 DIAGNOSIS — M170 Bilateral primary osteoarthritis of knee: Secondary | ICD-10-CM | POA: Diagnosis not present

## 2014-04-24 DIAGNOSIS — M170 Bilateral primary osteoarthritis of knee: Secondary | ICD-10-CM | POA: Diagnosis not present

## 2014-04-29 DIAGNOSIS — L821 Other seborrheic keratosis: Secondary | ICD-10-CM | POA: Diagnosis not present

## 2014-04-29 DIAGNOSIS — Z1283 Encounter for screening for malignant neoplasm of skin: Secondary | ICD-10-CM | POA: Diagnosis not present

## 2014-04-29 DIAGNOSIS — D1801 Hemangioma of skin and subcutaneous tissue: Secondary | ICD-10-CM | POA: Diagnosis not present

## 2014-04-29 DIAGNOSIS — Z872 Personal history of diseases of the skin and subcutaneous tissue: Secondary | ICD-10-CM | POA: Diagnosis not present

## 2014-05-01 DIAGNOSIS — M170 Bilateral primary osteoarthritis of knee: Secondary | ICD-10-CM | POA: Diagnosis not present

## 2014-05-08 DIAGNOSIS — M170 Bilateral primary osteoarthritis of knee: Secondary | ICD-10-CM | POA: Diagnosis not present

## 2014-08-09 ENCOUNTER — Encounter: Payer: Self-pay | Admitting: *Deleted

## 2014-08-19 ENCOUNTER — Telehealth: Payer: Self-pay

## 2014-08-19 ENCOUNTER — Encounter: Admitting: Family Medicine

## 2014-08-19 NOTE — Telephone Encounter (Signed)
Referrals? Im not sure who to note this to.

## 2014-08-19 NOTE — Telephone Encounter (Signed)
Pt states she received a call regarding her mammogram and she wanted it noted she is to have one with [facility] next week. Please call [phone] if needed

## 2014-08-26 DIAGNOSIS — Z1231 Encounter for screening mammogram for malignant neoplasm of breast: Secondary | ICD-10-CM | POA: Diagnosis not present

## 2014-08-26 DIAGNOSIS — Z803 Family history of malignant neoplasm of breast: Secondary | ICD-10-CM | POA: Diagnosis not present

## 2014-08-29 NOTE — Telephone Encounter (Signed)
Phoned [facility] & patient completed mammo 08/26/14 & is faxing me report.  Will update health maintenance once received.

## 2014-09-02 ENCOUNTER — Telehealth: Payer: Self-pay

## 2014-09-02 NOTE — Telephone Encounter (Signed)
Pt needs an order to [facility] for a DEXA scan. Her appt is on July 6 at 12:30 but needs an order faxed to them. Their fax number is [phone]

## 2014-09-11 ENCOUNTER — Other Ambulatory Visit: Payer: Self-pay

## 2014-09-11 DIAGNOSIS — Z13820 Encounter for screening for osteoporosis: Secondary | ICD-10-CM

## 2014-09-11 DIAGNOSIS — M810 Age-related osteoporosis without current pathological fracture: Secondary | ICD-10-CM | POA: Diagnosis not present

## 2014-09-11 DIAGNOSIS — Z1231 Encounter for screening mammogram for malignant neoplasm of breast: Secondary | ICD-10-CM

## 2014-09-12 ENCOUNTER — Other Ambulatory Visit: Payer: Self-pay

## 2014-09-12 DIAGNOSIS — E2839 Other primary ovarian failure: Secondary | ICD-10-CM

## 2014-09-17 ENCOUNTER — Encounter: Payer: Self-pay | Admitting: *Deleted

## 2014-10-11 ENCOUNTER — Encounter: Payer: Self-pay | Admitting: Family Medicine

## 2014-11-06 ENCOUNTER — Encounter: Payer: Self-pay | Admitting: Family Medicine

## 2014-11-06 ENCOUNTER — Ambulatory Visit (INDEPENDENT_AMBULATORY_CARE_PROVIDER_SITE_OTHER): Payer: Medicare Other | Admitting: Family Medicine

## 2014-11-06 VITALS — BP 112/66 | HR 86 | Temp 98.3°F | Resp 16 | Ht 61.5 in | Wt 129.0 lb

## 2014-11-06 DIAGNOSIS — Z1211 Encounter for screening for malignant neoplasm of colon: Secondary | ICD-10-CM

## 2014-11-06 DIAGNOSIS — Z23 Encounter for immunization: Secondary | ICD-10-CM

## 2014-11-06 DIAGNOSIS — E785 Hyperlipidemia, unspecified: Secondary | ICD-10-CM

## 2014-11-06 DIAGNOSIS — Z0000 Encounter for general adult medical examination without abnormal findings: Secondary | ICD-10-CM | POA: Diagnosis not present

## 2014-11-06 DIAGNOSIS — M797 Fibromyalgia: Secondary | ICD-10-CM | POA: Diagnosis not present

## 2014-11-06 DIAGNOSIS — Z131 Encounter for screening for diabetes mellitus: Secondary | ICD-10-CM | POA: Diagnosis not present

## 2014-11-06 DIAGNOSIS — Z1159 Encounter for screening for other viral diseases: Secondary | ICD-10-CM | POA: Diagnosis not present

## 2014-11-06 DIAGNOSIS — J301 Allergic rhinitis due to pollen: Secondary | ICD-10-CM

## 2014-11-06 DIAGNOSIS — F32A Depression, unspecified: Secondary | ICD-10-CM

## 2014-11-06 DIAGNOSIS — F329 Major depressive disorder, single episode, unspecified: Secondary | ICD-10-CM

## 2014-11-06 DIAGNOSIS — Z803 Family history of malignant neoplasm of breast: Secondary | ICD-10-CM | POA: Diagnosis not present

## 2014-11-06 DIAGNOSIS — F419 Anxiety disorder, unspecified: Secondary | ICD-10-CM

## 2014-11-06 DIAGNOSIS — G4700 Insomnia, unspecified: Secondary | ICD-10-CM | POA: Diagnosis not present

## 2014-11-06 DIAGNOSIS — F418 Other specified anxiety disorders: Secondary | ICD-10-CM

## 2014-11-06 LAB — COMPREHENSIVE METABOLIC PANEL
ALBUMIN: 4.9 g/dL (ref 3.6–5.1)
ALT: 16 U/L (ref 6–29)
AST: 17 U/L (ref 10–35)
Alkaline Phosphatase: 49 U/L (ref 33–130)
BUN: 18 mg/dL (ref 7–25)
CALCIUM: 10.2 mg/dL (ref 8.6–10.4)
CHLORIDE: 101 mmol/L (ref 98–110)
CO2: 31 mmol/L (ref 20–31)
Creat: 0.6 mg/dL (ref 0.50–0.99)
Glucose, Bld: 86 mg/dL (ref 65–99)
Potassium: 4.3 mmol/L (ref 3.5–5.3)
Sodium: 140 mmol/L (ref 135–146)
Total Bilirubin: 0.6 mg/dL (ref 0.2–1.2)
Total Protein: 7 g/dL (ref 6.1–8.1)

## 2014-11-06 LAB — LIPID PANEL
CHOL/HDL RATIO: 3.6 ratio (ref <=5.0)
Cholesterol: 218 mg/dL — ABNORMAL HIGH (ref 125–200)
HDL: 60 mg/dL (ref >=46)
LDL CALC: 139 mg/dL — AB (ref <130)
TRIGLYCERIDES: 93 mg/dL (ref <150)
VLDL: 19 mg/dL (ref <30)

## 2014-11-06 LAB — CBC WITH DIFFERENTIAL/PLATELET
Basophils Absolute: 0 10*3/uL (ref 0.0–0.1)
Basophils Relative: 0 % (ref 0–1)
EOS PCT: 1 % (ref 0–5)
Eosinophils Absolute: 0.1 10*3/uL (ref 0.0–0.7)
HEMATOCRIT: 42.4 % (ref 36.0–46.0)
HEMOGLOBIN: 14.4 g/dL (ref 12.0–15.0)
Lymphocytes Relative: 30 % (ref 12–46)
Lymphs Abs: 1.7 10*3/uL (ref 0.7–4.0)
MCH: 33 pg (ref 26.0–34.0)
MCHC: 34 g/dL (ref 30.0–36.0)
MCV: 97.2 fL (ref 78.0–100.0)
MONOS PCT: 8 % (ref 3–12)
MPV: 11.3 fL (ref 8.6–12.4)
Monocytes Absolute: 0.4 10*3/uL (ref 0.1–1.0)
NEUTROS ABS: 3.4 10*3/uL (ref 1.7–7.7)
Neutrophils Relative %: 61 % (ref 43–77)
Platelets: 223 10*3/uL (ref 150–400)
RBC: 4.36 MIL/uL (ref 3.87–5.11)
RDW: 12.7 % (ref 11.5–15.5)
WBC: 5.5 10*3/uL (ref 4.0–10.5)

## 2014-11-06 LAB — POCT URINALYSIS DIPSTICK
Bilirubin, UA: NEGATIVE
Glucose, UA: NEGATIVE
Nitrite, UA: NEGATIVE
Protein, UA: NEGATIVE
RBC UA: NEGATIVE
Spec Grav, UA: 1.015
Urobilinogen, UA: 0.2
pH, UA: 7

## 2014-11-06 MED ORDER — BUPROPION HCL ER (XL) 300 MG PO TB24: 300.0000 mg | ORAL_TABLET | Freq: Every day | ORAL | Status: DC

## 2014-11-06 MED ORDER — TRAZODONE HCL 50 MG PO TABS: 50.0000 mg | ORAL_TABLET | Freq: Every evening | ORAL | Status: DC | PRN

## 2014-11-06 NOTE — Patient Instructions (Signed)

## 2014-11-06 NOTE — Progress Notes (Signed)
Subjective:    Patient ID: Tracy Estrada, female    DOB: [birth year], 64 y.o.   MRN: [id number]  11/06/2014  Annual Exam; Anxiety; and Hyperlipidemia   HPI This 64 y.o. female presents for Annual Wellness Examination.  Last physical:  2013 Pap smear:  2013 Mammogram:  08-26-14 Colonoscopy:  2006; due for colonoscopy.  Wants to see Medoff.  Fecal incontinence.   Bone density:  2016 TDAP: 2013 Pneumovax:never Zostavax:  2013 Influenza: requesting Eye exam: due; +glasses Dental exam:  Every six months.  Hypercholesterolemia:   Anxiety and depression: Patient reports good compliance with medication, good tolerance to medication, and good symptom control.   Admits to increased stressors for the past several months; mother with dementia; pt is only child at this point since sister died of breast cancer.  Mind races at night. Not sleeping well.  No SI/HI.  Fibromyalgia:  Followed by Deveschwar.  OA in knees, hands now. S/p five injections by [facility]eschwar with persistent pain.  Insomnia: taking Trazodone 50mg  one at bedtime.  Does not take every night.  Not working well; still wakes up several times per night.   Review of Systems  Constitutional: Positive for fatigue. Negative for fever, chills, diaphoresis, activity change, appetite change and unexpected weight change.  HENT: Positive for hearing loss. Negative for congestion, dental problem, drooling, ear discharge, ear pain, facial swelling, mouth sores, nosebleeds, postnasal drip, rhinorrhea, sinus pressure, sneezing, sore throat, tinnitus, trouble swallowing and voice change.   Eyes: Negative for photophobia, pain, discharge, redness, itching and visual disturbance.  Respiratory: Negative for apnea, cough, choking, chest tightness, shortness of breath, wheezing and stridor.   Cardiovascular: Negative for chest pain, palpitations and leg swelling.  Gastrointestinal: Negative for nausea, vomiting, abdominal pain, diarrhea, constipation,  blood in stool, abdominal distention, anal bleeding and rectal pain.  Endocrine: Negative for cold intolerance, heat intolerance, polydipsia, polyphagia and polyuria.  Genitourinary: Negative for dysuria, urgency, frequency, hematuria, flank pain, decreased urine volume, vaginal bleeding, vaginal discharge, enuresis, difficulty urinating, genital sores, vaginal pain, menstrual problem, pelvic pain and dyspareunia.  Musculoskeletal: Positive for myalgias and arthralgias. Negative for back pain, joint swelling, gait problem, neck pain and neck stiffness.  Skin: Negative for color change, pallor, rash and wound.  Allergic/Immunologic: Positive for environmental allergies. Negative for food allergies and immunocompromised state.  Neurological: Negative for dizziness, tremors, seizures, syncope, facial asymmetry, speech difficulty, weakness, light-headedness, numbness and headaches.  Hematological: Negative for adenopathy. Does not bruise/bleed easily.  Psychiatric/Behavioral: Positive for sleep disturbance, dysphoric mood and agitation. Negative for suicidal ideas, hallucinations, behavioral problems, confusion, self-injury and decreased concentration. The patient is not nervous/anxious and is not hyperactive.     Past Medical History  Diagnosis Date  . Lumbago   . Edema   . Pure hypercholesterolemia   . Closed fracture of unspecified part of fibula   . Insomnia, unspecified   . Anxiety state, unspecified   . Depressive disorder, not elsewhere classified   . Achilles bursitis or tendinitis   . Pain in joint, site unspecified   . MVP (mitral valve prolapse)   . Fibromyalgia   . Peptic ulcer     no testing  . Arthritis   . Meniere's disease 1986  . Diverticulosis of colon (without mention of hemorrhage)   . TSH elevation    Past Surgical History  Procedure Laterality Date  . Abdominal hysterectomy  03/08/1980    fibroids; ovaries intact.   Allergies  Allergen Reactions  . Demerol  [Meperidine] Nausea  And Vomiting    Social History   Social History  . Marital Status: Married    Spouse Name: N/A  . Number of Children: N/A  . Years of Education: N/A   Occupational History  . retired    Social History Main Topics  . Smoking status: Never Smoker   . Smokeless tobacco: Never Used  . Alcohol Use: 0.0 oz/week    0 Standard drinks or equivalent per week     Comment: 1 beers per week or wine  . Drug Use: No  . Sexual Activity: Yes   Other Topics Concern  . Not on file   Social History Narrative   Marital status:  Married x 13 years; second marriage;happily married,husband has PTSD.     Children: 2 daughters [name], [name]); 7 grandchildren; 2 gg      Lives: with husband      Tobacco: never      Alcohol: weekends; 3-4 glasses of wine per week or beer.      Drugs:  None      Exercise: walking 45 minutes per day.      Seatbelt: 100%      Guns: unloaded.     Caffeine not every day. 7 grandchildren and 2 GG. Organ donor NO.      Family History  Problem Relation Age of Onset  . Hyperlipidemia Mother   . Hypertension Mother   . Colon polyps Mother   . Cancer Sister     breast cancer  . Coronary artery disease Father      AMI age 20  . Heart disease Father 14    AMI x 2; first AMI in 76s  . Stroke Maternal Grandmother   . Heart disease Paternal Grandfather        Objective:    BP 112/66 mmHg  Pulse 86  Temp(Src) 98.3 F (36.8 C) (Oral)  Resp 16  Ht 5' 1.5" (1.562 m)  Wt 129 lb (58.514 kg)  BMI 23.98 kg/m2 Physical Exam  Constitutional: She is oriented to person, place, and time. She appears well-developed and well-nourished. No distress.  HENT:  Head: Normocephalic and atraumatic.  Right Ear: External ear normal.  Left Ear: External ear normal.  Nose: Nose normal.  Mouth/Throat: Oropharynx is clear and moist.  Eyes: Conjunctivae and EOM are normal. Pupils are equal, round, and reactive to light.  Neck: Normal range of motion and full  passive range of motion without pain. Neck supple. No JVD present. Carotid bruit is not present. No thyromegaly present.  Cardiovascular: Normal rate, regular rhythm and normal heart sounds.  Exam reveals no gallop and no friction rub.   No murmur heard. Pulmonary/Chest: Effort normal and breath sounds normal. She has no wheezes. She has no rales. Right breast exhibits no inverted nipple, no mass, no nipple discharge, no skin change and no tenderness. Left breast exhibits no inverted nipple, no mass, no nipple discharge, no skin change and no tenderness. Breasts are symmetrical.  Abdominal: Soft. Bowel sounds are normal. She exhibits no distension and no mass. There is no tenderness. There is no rebound and no guarding.  Genitourinary: Vagina normal. There is no rash, tenderness or lesion on the right labia. There is no rash, tenderness or lesion on the left labia. Right adnexum displays no mass, no tenderness and no fullness. Left adnexum displays no mass, no tenderness and no fullness.  Musculoskeletal:       Right shoulder: Normal.       Left  shoulder: Normal.       Cervical back: Normal.  Lymphadenopathy:    She has no cervical adenopathy.  Neurological: She is alert and oriented to person, place, and time. She has normal reflexes. No cranial nerve deficit. She exhibits normal muscle tone. Coordination normal.  Skin: Skin is warm and dry. No rash noted. She is not diaphoretic. No erythema. No pallor.  Psychiatric: She has a normal mood and affect. Her behavior is normal. Judgment and thought content normal.  Nursing note and vitals reviewed.  Results for orders placed or performed in visit on 11/06/14  POCT urinalysis dipstick  Result Value Ref Range   Color, UA yellow    Clarity, UA clear    Glucose, UA neg    Bilirubin, UA neg    Ketones, UA trace    Spec Grav, UA 1.015    Blood, UA neg    pH, UA 7.0    Protein, UA neg    Urobilinogen, UA 0.2    Nitrite, UA neg    Leukocytes, UA  small (1+) (A) Negative   PREVNAR-13 AND INFLUENZA VACCINES ADMINISTERED.    Assessment & Plan:   1. Encounter for Medicare annual wellness exam   2. Hyperlipidemia   3. Fibromyalgia   4. Allergic rhinitis due to pollen   5. Screening for diabetes mellitus   6. Need for hepatitis C screening test   7. Colon cancer screening   8. Family history of breast cancer in sister   74. Anxiety and depression   10. Insomnia     1.  Annual Wellness Examination; anticipatory guidance --- exercise, weight maintenance, ASA 81mg  daily, 1200mg  calcium daily.  No longer warrants pap smears due to hysterectomy status.  Mammogram and bone density UTD. Due for colonoscopy; referral placed.  Immunizations updated; s/p Prevnar-13 and influenza vaccines.  Independent with ADLs. Undergoing treatment for depression.  Low fall risk.  Hearing loss chronic in R ear.  No urinary incontinence.   2.  Hyperlipidemia: stable; obtain labs. 3.  Fibromyalgia: stable; followed by Deveschwar. 4.  Allergic Rhinitis: stable; continue medication. 5.  Screening DMII: obtain glucose. 6.  Colon cancer screening: refer to GI for colonoscopy. 7.  Need for Hepatitis C screening: obtain Hepatitis C ab. 8.  Family history of breast cancer in sister at age 19: refer for genetics testing. 9.  Anxiety and depression: worsening; increase Wellbutrin XL to 300mg  daily. 10. Insomnia: uncontrolled; increase Trazodone 50mg  to two qhs.   Orders Placed This Encounter  Procedures  . Pneumococcal conjugate vaccine 13-valent IM  . Flu Vaccine QUAD 36+ mos IM  . CBC with Differential/Platelet  . Comprehensive metabolic panel    Order Specific Question:  Has the patient fasted?    Answer:  Yes  . Lipid panel    Order Specific Question:  Has the patient fasted?    Answer:  Yes  . Hepatitis C antibody  . Ambulatory referral to Gastroenterology    Referral Priority:  Routine    Referral Type:  Consultation    Referral Reason:  Specialty  Services Required    Number of Visits Requested:  1  . Ambulatory referral to Genetics    Referral Priority:  Routine    Referral Type:  Consultation    Referral Reason:  Specialty Services Required    Number of Visits Requested:  1  . POCT urinalysis dipstick   Meds ordered this encounter  Medications  . OVER THE COUNTER MEDICATION  Sig: daily.  Marland Kitchen MAGNESIUM PO    Sig: Take 50 mg by mouth daily.  . TURMERIC PO    Sig: Take 720 mg by mouth daily.  Marland Kitchen OVER THE COUNTER MEDICATION    Sig: 1,000 mg daily.  Marland Kitchen buPROPion (WELLBUTRIN XL) 300 MG 24 hr tablet    Sig: Take 1 tablet (300 mg total) by mouth daily.    Dispense:  30 tablet    Refill:  11  . traZODone (DESYREL) 50 MG tablet    Sig: Take 1-2 tablets (50-100 mg total) by mouth at bedtime as needed for sleep.    Dispense:  60 tablet    Refill:  11    Return in about 6 months (around 05/06/2015) for recheck mood, insomnia.    Jerita Wimbush Elayne Guerin, M.D. [facility] [address] [place], [place]  [address] [phone] phone [phone] fax

## 2014-11-07 LAB — HEPATITIS C ANTIBODY: HCV AB: NEGATIVE

## 2014-11-11 ENCOUNTER — Encounter: Payer: Self-pay | Admitting: Family Medicine

## 2014-11-26 ENCOUNTER — Encounter: Payer: Self-pay | Admitting: Family Medicine

## 2014-11-26 ENCOUNTER — Other Ambulatory Visit: Payer: Self-pay | Admitting: Family Medicine

## 2014-11-26 MED ORDER — CEPHALEXIN 500 MG PO CAPS: 500.0000 mg | ORAL_CAPSULE | Freq: Three times a day (TID) | ORAL | Status: DC

## 2014-11-27 DIAGNOSIS — M79672 Pain in left foot: Secondary | ICD-10-CM | POA: Diagnosis not present

## 2014-11-27 DIAGNOSIS — S92515A Nondisplaced fracture of proximal phalanx of left lesser toe(s), initial encounter for closed fracture: Secondary | ICD-10-CM | POA: Diagnosis not present

## 2014-12-18 DIAGNOSIS — M79672 Pain in left foot: Secondary | ICD-10-CM | POA: Diagnosis not present

## 2014-12-18 DIAGNOSIS — S92515G Nondisplaced fracture of proximal phalanx of left lesser toe(s), subsequent encounter for fracture with delayed healing: Secondary | ICD-10-CM | POA: Diagnosis not present

## 2014-12-25 DIAGNOSIS — M19041 Primary osteoarthritis, right hand: Secondary | ICD-10-CM | POA: Diagnosis not present

## 2014-12-25 DIAGNOSIS — M7552 Bursitis of left shoulder: Secondary | ICD-10-CM | POA: Diagnosis not present

## 2014-12-25 DIAGNOSIS — M5030 Other cervical disc degeneration, unspecified cervical region: Secondary | ICD-10-CM | POA: Diagnosis not present

## 2014-12-25 DIAGNOSIS — R5381 Other malaise: Secondary | ICD-10-CM | POA: Diagnosis not present

## 2014-12-25 DIAGNOSIS — M791 Myalgia: Secondary | ICD-10-CM | POA: Diagnosis not present

## 2015-01-14 ENCOUNTER — Telehealth: Payer: Self-pay

## 2015-01-14 NOTE — Telephone Encounter (Signed)
pts mom just passed away and she has developed a cough,discolored drainage etc,wants  Dr Tamala Julian to call her in an antibiotic and cough med,cannot come in right now since she is In the midst of funeral arrangements  Best phone [phone]   [facility]

## 2015-01-16 MED ORDER — DM-GUAIFENESIN ER 30-600 MG PO TB12: 1.0000 | ORAL_TABLET | Freq: Two times a day (BID) | ORAL | Status: DC

## 2015-01-16 MED ORDER — AMOXICILLIN-POT CLAVULANATE 875-125 MG PO TABS: 1.0000 | ORAL_TABLET | Freq: Two times a day (BID) | ORAL | Status: DC

## 2015-01-16 NOTE — Telephone Encounter (Signed)
LMOM for patient to pick up medication at pharmacy and come in for OV for the SOB.

## 2015-01-16 NOTE — Telephone Encounter (Signed)
Call --- please advise patient that I am so sorry to hear about her mother.  I have sent in Augmentin and Mucinex DM for patient.  Please have her present to office for shortness of breath or worsening symptoms.

## 2015-02-05 ENCOUNTER — Telehealth: Payer: Self-pay

## 2015-02-05 DIAGNOSIS — Z803 Family history of malignant neoplasm of breast: Secondary | ICD-10-CM

## 2015-02-05 NOTE — Telephone Encounter (Signed)
The patient called to inquire about a referral placed at her last OV with Dr Tamala Julian.  The referral had been placed due to the patient's family history of breast cancer, and the referral was sent to the [facility].  It appears that the facility cancelled the referral because the patient's listed phone number was disconnected at that time.  This removed it from the work queue, so it wasn't able to be followed up on.  I can reopen the referral and make sure the patient's contact information is updated; however, I wanted to make sure that it still should be sent to the [facility] or if it should be sent to a genetics specialist.  Please advise, thank you.  Patient CB#: [phone] (phone # verified by pt)

## 2015-02-05 NOTE — Telephone Encounter (Signed)
Im thinking [facility]. Dr Tamala Julian?

## 2015-02-06 NOTE — Telephone Encounter (Signed)
I really wanted genetics but the EPIC referral for genetics only allows for pediatric genetics thus I chose oncology within the genetics referral.  Very confusing.

## 2015-02-20 ENCOUNTER — Other Ambulatory Visit: Payer: Self-pay

## 2015-02-20 ENCOUNTER — Encounter: Payer: Self-pay | Admitting: Genetic Counselor

## 2015-02-25 ENCOUNTER — Telehealth: Payer: Self-pay | Admitting: Genetic Counselor

## 2015-02-25 NOTE — Telephone Encounter (Signed)
Tracy Estrada called with some questions regarding her appt on 12/22.  I advised her that, yes, she can eat prior to having her blood work for genetic testing, since she has no other blood work that needs to be done that day for which she would need to be fasting.  Discussed that the hour-long appointment is the genetic counseling session which we always do prior to genetic testing.  Explained what this session would entail.  Discussed that she should come about 15-20 minutes early to check in prior to her appointment.  Tracy Estrada voiced her understanding and I will see her on December 22nd.

## 2015-02-27 ENCOUNTER — Other Ambulatory Visit: Payer: Medicare Other

## 2015-02-27 ENCOUNTER — Ambulatory Visit (HOSPITAL_BASED_OUTPATIENT_CLINIC_OR_DEPARTMENT_OTHER): Payer: Medicare Other | Admitting: Genetic Counselor

## 2015-02-27 ENCOUNTER — Encounter: Payer: Self-pay | Admitting: Genetic Counselor

## 2015-02-27 DIAGNOSIS — Z315 Encounter for genetic counseling: Secondary | ICD-10-CM

## 2015-02-27 DIAGNOSIS — Z800 Family history of malignant neoplasm of digestive organs: Secondary | ICD-10-CM | POA: Diagnosis not present

## 2015-02-27 DIAGNOSIS — Z801 Family history of malignant neoplasm of trachea, bronchus and lung: Secondary | ICD-10-CM

## 2015-02-27 DIAGNOSIS — Z803 Family history of malignant neoplasm of breast: Secondary | ICD-10-CM

## 2015-02-27 DIAGNOSIS — Z8041 Family history of malignant neoplasm of ovary: Secondary | ICD-10-CM | POA: Diagnosis not present

## 2015-02-27 NOTE — Progress Notes (Signed)
REFERRING PROVIDER: Wardell Honour, MD [address] [place], [place] [address]  PRIMARY PROVIDER:  [name], MD  PRIMARY REASON FOR VISIT:  1. Family history of breast cancer in sister   2. Family history of ovarian cancer   3. Family history of colon cancer   4. Family history of stomach cancer   5. Family history of lung cancer      HISTORY OF PRESENT ILLNESS:   Tracy Estrada, a 64 y.o. female, was seen for a [facility] genetics consultation at the request of Dr. Tamala Julian due to a family history of early-onset breast cancer, ovarian cancer, and other cancers.  Tracy Estrada presents to clinic today with her husband to discuss the possibility of a hereditary predisposition to cancer, genetic testing, and to further clarify her future cancer risks, as well as potential cancer risks for family members.   Tracy Estrada is a 64 y.o. female with no personal history of cancer.    CANCER HISTORY:   No history exists.     HORMONAL RISK FACTORS:  Menarche was at age 52.  First live birth at age 54.  OCP use for approximately 3 years.  Ovaries intact: yes.  Hysterectomy: yes - partial for cyst/fibroids. Menopausal status: postmenopausal.  HRT use: 0 years. Colonoscopy: yes; reports some polyps, but not many; is on a 10-year schedule. Mammogram within the last year: yes. Number of breast biopsies: 0; but has history of breast cyst aspiration Up to date with pelvic exams:  Yes; this that she had this year will be her last. Any excessive radiation exposure in the past:  no, but reports some secondhand smoke exposure  Past Medical History  Diagnosis Date  . Lumbago   . Edema   . Pure hypercholesterolemia   . Closed fracture of unspecified part of fibula   . Insomnia, unspecified   . Anxiety state, unspecified   . Depressive disorder, not elsewhere classified   . Achilles bursitis or tendinitis   . Pain in joint, site unspecified   . MVP (mitral valve prolapse)   .  Fibromyalgia   . Peptic ulcer     no testing  . Arthritis   . Meniere's disease 1986  . Diverticulosis of colon (without mention of hemorrhage)   . TSH elevation     Past Surgical History  Procedure Laterality Date  . Abdominal hysterectomy  03/08/1980    fibroids; ovaries intact.    Social History   Social History  . Marital Status: Married    Spouse Name: N/A  . Number of Children: N/A  . Years of Education: N/A   Occupational History  . retired    Social History Main Topics  . Smoking status: Never Smoker   . Smokeless tobacco: Never Used  . Alcohol Use: 0.0 oz/week    0 Standard drinks or equivalent per week     Comment: 3-4 beers per week or wine  . Drug Use: No  . Sexual Activity: Yes   Other Topics Concern  . None   Social History Narrative   Marital status:  Married x 13 years; second marriage;happily married,husband has PTSD.     Children: 2 daughters [name], [name]); 7 grandchildren; 2 gg      Lives: with husband      Tobacco: never      Alcohol: weekends; 3-4 glasses of wine per week or beer.      Drugs:  None      Exercise: walking 45 minutes per  day.      Seatbelt: 100%      Guns: unloaded.     Caffeine not every day. 7 grandchildren and 2 GG. Organ donor NO.        FAMILY HISTORY:  We obtained a detailed, 4-generation family history.  Significant diagnoses are listed below: Family History  Problem Relation Age of Onset  . Hyperlipidemia Mother   . Hypertension Mother   . Colon polyps Mother   . Breast cancer Sister 64    with metastasis to bone; smoker  . Coronary artery disease Father      AMI age 52  . Heart disease Father 70    AMI x 2; first AMI in 83s  . Stroke Maternal Grandmother   . Heart disease Paternal Grandfather   . Heart attack Paternal Grandfather   . Colon cancer Maternal Uncle     dx. >50  . Colon cancer Paternal Aunt     dx. 47s  . Heart attack Paternal Uncle   . Alzheimer's disease Paternal Grandmother   . Breast  cancer Cousin   . Ovarian cancer Cousin     dx. late 70s/dx. 60s  . Lung cancer Cousin     dx. 22s  . Stomach cancer Other     Tracy Estrada has two daughters--ages 8 and 22, who have never had cancer.  One of her daughters has a history of abnormal mammogram findings that have had normal follow-ups.  Her daughters are especially anxious to have a genetic test result.  Tracy Estrada has one full sister who died of metastatic breast cancer at 53.  She was initially diagnosed at the age of 72.    Tracy Estrada's mother recently passed away at the age of 40; she never had a colon cancer, but did have a history of an unspecified number of colon polyps (not requiring surgery).  Tracy Estrada mother had one full brother who has passed away.  He had a history of colon cancer, diagnosed at a age over the age of 16.  He has two daughters and one son.  One daughter has passed away, but none of these cousins have every been diagnosed with cancer.  Tracy Estrada maternal grandparents passed away in their 19s.  She has no further information for any maternal great aunts/uncles or great grandparents.  Tracy Estrada father had two full sisters and six full brothers.  One sister died of colon cancer in her 40s.  No other siblings had cancer--most passed away in their 44s or older.  One brother died of a heart attack in his 33s.  Several paternal first cousins have had cancer.  One (daughter of an uncle) was diagnosed with breast cancer at an unspecified age.  She has one sister who is a smoker and has a history of lung cancer.  Another paternal first cousin (the only daughter of another uncle) died of ovarian cancer in her 28s.  Another paternal first cousin had ovarian cancer in her late 40s (only daughter of an aunt).  Another paternal first cousin died of lung cancer in her 52s and she was also likely a smoker.  Tracy Estrada paternal grandmother died of Alzheimer's in her late 82s-early 80s.  Her paternal  grandfather died of a heart attack in his 25s.  She has no further information for any paternal great aunts/uncles or great grandparents.  Patient's maternal and paternal ancestors are of Caucasian descent. There is no reported Ashkenazi Jewish ancestry. There is no known  consanguinity.  GENETIC COUNSELING ASSESSMENT: Navil Kole is a 64 y.o. female with a family history of breast and ovarian cancer which is somewhat suggestive of a hereditary breast/ovarian cancer syndrome and predisposition to cancer. We, therefore, discussed and recommended the following at today's visit.   DISCUSSION: We reviewed the characteristics, features and inheritance patterns of hereditary cancer syndromes. We also discussed genetic testing, including the appropriate family members to test, the process of testing, insurance coverage and turn-around-time for results. We discussed the implications of a negative, positive and/or variant of uncertain significant result. We recommended Ms. Dirks pursue genetic testing for the 20-gene Breast/Ovarian Cancer Panel through [facility].  The Breast/Ovarian Cancer Panel offered by [facility] Hope Pigeon, MD) includes sequencing and deletion/duplication analysis for the following 19 genes:  ATM, BARD1, BRCA1, BRCA2, BRIP1, CDH1, CHEK2, FANCC, MLH1, MSH2, MSH6, NBN, PALB2, PMS2, PTEN, RAD51C, RAD51D, TP53, and XRCC2.  This panel also includes deletion/duplication analysis (without sequencing) for one gene, EPCAM.  Based on Ms. Leite's family history of cancer, she meets medical criteria for genetic testing. Despite that she meets criteria, she may still have an out of pocket cost. We discussed that if her out of pocket cost for testing is over $100, the laboratory will call and confirm whether she wants to proceed with testing.  If the out of pocket cost of testing is less than $100 she will be billed by the genetic testing laboratory.  Furthermore, we discussed  Medicare lack of coverage for genetic testing when the patient is unaffected.  If this is a problem for Ms. Brod's out-of-pocket cost, we discussed other affordable self-pay options, such as those offered by [employer] and [employer].  Regardless, Ms. Foglemen will be contacted by the lab if her OOP is over $100, and, at that point/if need be, we can reconsider our testing order.  PLAN: After considering the risks, benefits, and limitations, Ms. Fedor  provided informed consent to pursue genetic testing and the blood sample was sent to [facility] for analysis of the 20-gene Breast/Ovarian Cancer Panel test. Results should be available within approximately 2-3 weeks' time, at which point they will be disclosed by telephone to Ms. Glasby, as will any additional recommendations warranted by these results. Ms. Reliford will receive a summary of her genetic counseling visit and a copy of her results once available. This information will also be available in Epic. We encouraged Ms. Zeiger to remain in contact with cancer genetics annually so that we can continuously update the family history and inform her of any changes in cancer genetics and testing that may be of benefit for her family. Ms. Buccellato questions were answered to her satisfaction today. Our contact information was provided should additional questions or concerns arise.  Thank you for the referral and allowing [place] to share in the care of your patient.   Jeanine Luz, MS Genetic Counselor [email or web address] Phone: [phone]  The patient was seen for a total of 55 minutes in face-to-face genetic counseling.  This patient was discussed with Drs. Magrinat, Lindi Adie and/or Burr Medico who agrees with the above.    _______________________________________________________________________ For Office Staff:  Number of people involved in session: 2 Was an Intern/ student involved with case: no

## 2015-03-04 DIAGNOSIS — H6121 Impacted cerumen, right ear: Secondary | ICD-10-CM | POA: Diagnosis not present

## 2015-03-04 DIAGNOSIS — H93291 Other abnormal auditory perceptions, right ear: Secondary | ICD-10-CM | POA: Diagnosis not present

## 2015-03-08 ENCOUNTER — Encounter: Payer: Self-pay | Admitting: Family Medicine

## 2015-03-09 HISTORY — PX: COLONOSCOPY: SHX174

## 2015-03-21 ENCOUNTER — Telehealth: Payer: Self-pay | Admitting: Genetic Counselor

## 2015-03-21 ENCOUNTER — Ambulatory Visit: Payer: Self-pay | Admitting: Genetic Counselor

## 2015-03-21 DIAGNOSIS — Z800 Family history of malignant neoplasm of digestive organs: Secondary | ICD-10-CM

## 2015-03-21 DIAGNOSIS — Z1379 Encounter for other screening for genetic and chromosomal anomalies: Secondary | ICD-10-CM | POA: Insufficient documentation

## 2015-03-21 DIAGNOSIS — Z8041 Family history of malignant neoplasm of ovary: Secondary | ICD-10-CM

## 2015-03-21 DIAGNOSIS — Z803 Family history of malignant neoplasm of breast: Secondary | ICD-10-CM

## 2015-03-21 NOTE — Telephone Encounter (Signed)
Discussed with [name] that her genetic test results were negative for mutations within any of 20 genes that would cause her to be at an increased risk for breast, ovarian, or Lynch syndrome cancers.  Additionally, no variants of uncertain significance (VUSes) were found.  Discussed that this negative result, while potentially reassuring for [name]'s own cancer risks, is somewhat less reassuring for the familial cancer risks, since [name] herself has not been diagnosed with cancer.  It would be helpful to have other affected relatives genetically tested, however, the only living relative who has had breast/ovarian cancer is a paternal cousin who is in her 45s and has dementia, so this likely will not be possible.  Discussed that [name]'s nephew, whose mother was diagnosed with breast cancer at 53 could also likely have genetic counseling and testing.  He lives in [place], so [name] will let him know that he can use the [employer] website to look up a cancer genetic counselor in his area.  One thing that is reassuring in regards to this negative result for [name]'s cancer risks, is that she is currently 46 and has never been diagnosed with cancer.  We discussed that she should continue to follow her doctor's recommendations for breast and other cancer screening, and that her daughters should continue to have annual mammograms as well.  Encouraged her to call and update [place] if anyone in the family is diagnosed with cancer or if anyone else has genetic testing in the family.  [name] is welcome to call or email with any questions.

## 2015-03-21 NOTE — Progress Notes (Signed)
GENETIC TEST RESULT  HPI: Ms. Winslow was previously seen in the [facility] due to a family history of early-onset breast cancer, family history of ovarian cancer, colon, and other cancers and concerns regarding a hereditary predisposition to cancer. Please refer to our prior cancer genetics clinic note from February 27, 2015 for more information regarding Ms. Raimondi's medical, social and family histories, and our assessment and recommendations, at the time. Ms. Betterton recent genetic test results were disclosed to her, as were recommendations warranted by these results. These results and recommendations are discussed in more detail below.  GENETIC TEST RESULTS: At the time of Ms. Macdonnell's visit on 02/27/2015, we recommended she pursue genetic testing of the 20-gene Breast/Ovarian Cancer Panel with MSH2 Exons 1-7 Inversion Analysis through [employer].  The Breast/Ovarian Cancer Panel offered by [facility] Hope Pigeon, MD) includes sequencing and deletion/duplication analysis for the following 19 genes:  ATM, BARD1, BRCA1, BRCA2, BRIP1, CDH1, CHEK2, FANCC, MLH1, MSH2, MSH6, NBN, PALB2, PMS2, PTEN, RAD51C, RAD51D, TP53, and XRCC2.  This panel also includes deletion/duplication analysis (without sequencing) for one gene, EPCAM.  Those results are now back, the report dates for which are March 19, 2015 (Breast/Ovarian Cancer Panel) and March 08, 2015 (MSH2 Exons 1-7 Inversion Analysis).  Genetic testing was normal, and did not reveal a deleterious mutation in these genes.  Additionally, no variants of uncertain significance (VUSes) were found.  The test report will be scanned into EPIC and will be located under the Results Review tab in the Pathology>Molecular Pathology section.   We discussed with Ms. Cutshaw that since the current genetic testing is not perfect, it is possible there may be a gene mutation in one of these genes that current testing  cannot detect, but that chance is small. We also discussed, that it is possible that another gene that has not yet been discovered, or that we have not yet tested, is responsible for the cancer diagnoses in the family, and it is, therefore, important to remain in touch with cancer genetics in the future so that we can continue to offer Ms. Cortez the most up-to-date genetic testing.   CANCER SCREENING RECOMMENDATIONS: Thus, we still do not have an explanation for the family history of cancer, although this normal result likely is reassuring and indicates that Ms. Calderone does not likely have an increased risk of cancer due to a mutation in one of these genes.  Additionally, Ms. Leckey is 65 and has never been diagnosed with cancer.  We, therefore, recommended  Ms. Kessenich continue to follow the cancer screening guidelines provided by her primary healthcare providers.  Further familial genetic testing will be helpful to further elucidate the personal and familial cancer risks, particularly for the paternal family members.    RECOMMENDATIONS FOR FAMILY MEMBERS: Women in this family might be at some increased risk of developing cancer, over the general population risk, simply due to the family history of cancer. We recommended women in this family have a yearly mammogram beginning at age 25, or 78 years younger than the earliest onset of cancer, an an annual clinical breast exam, and perform monthly breast self-exams.  Ms. Wuellner daughters should continue to have annual mammogram screening.  Women in this family should also have a gynecological exam as recommended by their primary provider. All family members should have a colonoscopy by age 47.  All family members should make their primary care providers aware of the family history of cancer, so that they may  continue to receive the most appropriate cancer screening in the future, especially as screening guidelines continue to evolve.    Based on  Ms. Frayre's family history, we recommended her paternal first cousin, who was diagnosed with either breast or ovarian cancer, have genetic counseling and testing.  However, Ms. Bertagnolli reports that this cousin has dementia, so this may not be a possibility.  There are no additional affected paternal relatives still living who could undergo genetic testing.  We discussed that Ms. Duchemin's nephew, whose mother was diagnosed with breast cancer at 51, have genetic counseling and testing, if interested.  This nephew lives in [place], but Ms. Convery will pass along the website for the [employer] ([employer]), where he can search for a cancer genetic counselor by zipcode.  Ms. Turley will further let us know if we can be of any assistance in coordinating genetic counseling and/or testing for these family members.   FOLLOW-UP: Lastly, we discussed with Ms. Brundage that cancer genetics is a rapidly advancing field and it is possible that new genetic tests will be appropriate for her and/or her family members in the future. We encouraged her to remain in contact with cancer genetics on an annual basis so we can update her personal and family histories and let her know of advances in cancer genetics that may benefit this family.   Our contact number was provided. Ms. Amato questions were answered to her satisfaction, and she knows she is welcome to call us at anytime with additional questions or concerns.   Jeanine Luz, MS Genetic Counselor [email or web address] Phone: [phone]

## 2015-04-07 ENCOUNTER — Encounter: Payer: Self-pay | Admitting: Family Medicine

## 2015-04-07 DIAGNOSIS — K641 Second degree hemorrhoids: Secondary | ICD-10-CM | POA: Diagnosis not present

## 2015-04-07 DIAGNOSIS — K5730 Diverticulosis of large intestine without perforation or abscess without bleeding: Secondary | ICD-10-CM | POA: Diagnosis not present

## 2015-04-07 DIAGNOSIS — K648 Other hemorrhoids: Secondary | ICD-10-CM | POA: Diagnosis not present

## 2015-04-07 DIAGNOSIS — Z1211 Encounter for screening for malignant neoplasm of colon: Secondary | ICD-10-CM | POA: Diagnosis not present

## 2015-04-07 LAB — HM COLONOSCOPY

## 2015-04-21 ENCOUNTER — Encounter: Payer: Self-pay | Admitting: Family Medicine

## 2015-04-30 DIAGNOSIS — L738 Other specified follicular disorders: Secondary | ICD-10-CM | POA: Diagnosis not present

## 2015-04-30 DIAGNOSIS — R58 Hemorrhage, not elsewhere classified: Secondary | ICD-10-CM | POA: Diagnosis not present

## 2015-04-30 DIAGNOSIS — D1801 Hemangioma of skin and subcutaneous tissue: Secondary | ICD-10-CM | POA: Diagnosis not present

## 2015-04-30 DIAGNOSIS — Z872 Personal history of diseases of the skin and subcutaneous tissue: Secondary | ICD-10-CM | POA: Diagnosis not present

## 2015-04-30 DIAGNOSIS — D485 Neoplasm of uncertain behavior of skin: Secondary | ICD-10-CM | POA: Diagnosis not present

## 2015-04-30 DIAGNOSIS — Z1283 Encounter for screening for malignant neoplasm of skin: Secondary | ICD-10-CM | POA: Diagnosis not present

## 2015-04-30 DIAGNOSIS — L821 Other seborrheic keratosis: Secondary | ICD-10-CM | POA: Diagnosis not present

## 2015-05-06 DIAGNOSIS — H2513 Age-related nuclear cataract, bilateral: Secondary | ICD-10-CM | POA: Diagnosis not present

## 2015-05-07 DIAGNOSIS — K641 Second degree hemorrhoids: Secondary | ICD-10-CM | POA: Diagnosis not present

## 2015-05-09 ENCOUNTER — Ambulatory Visit: Payer: Self-pay | Admitting: Family Medicine

## 2015-05-20 ENCOUNTER — Ambulatory Visit: Payer: Medicare Other | Admitting: Family Medicine

## 2015-05-28 DIAGNOSIS — K641 Second degree hemorrhoids: Secondary | ICD-10-CM | POA: Diagnosis not present

## 2015-06-11 DIAGNOSIS — K641 Second degree hemorrhoids: Secondary | ICD-10-CM | POA: Diagnosis not present

## 2015-09-16 ENCOUNTER — Ambulatory Visit (INDEPENDENT_AMBULATORY_CARE_PROVIDER_SITE_OTHER): Payer: Medicare Other | Admitting: Family Medicine

## 2015-09-16 ENCOUNTER — Encounter: Payer: Self-pay | Admitting: Family Medicine

## 2015-09-16 VITALS — BP 126/62 | HR 75 | Temp 98.6°F | Resp 16 | Ht 61.25 in | Wt 126.8 lb

## 2015-09-16 DIAGNOSIS — Z8041 Family history of malignant neoplasm of ovary: Secondary | ICD-10-CM | POA: Diagnosis not present

## 2015-09-16 DIAGNOSIS — G4700 Insomnia, unspecified: Secondary | ICD-10-CM | POA: Diagnosis not present

## 2015-09-16 DIAGNOSIS — M797 Fibromyalgia: Secondary | ICD-10-CM | POA: Insufficient documentation

## 2015-09-16 DIAGNOSIS — Z800 Family history of malignant neoplasm of digestive organs: Secondary | ICD-10-CM | POA: Diagnosis not present

## 2015-09-16 DIAGNOSIS — E7800 Pure hypercholesterolemia, unspecified: Secondary | ICD-10-CM | POA: Diagnosis not present

## 2015-09-16 DIAGNOSIS — F418 Other specified anxiety disorders: Secondary | ICD-10-CM

## 2015-09-16 DIAGNOSIS — Z803 Family history of malignant neoplasm of breast: Secondary | ICD-10-CM | POA: Diagnosis not present

## 2015-09-16 DIAGNOSIS — F329 Major depressive disorder, single episode, unspecified: Secondary | ICD-10-CM

## 2015-09-16 DIAGNOSIS — F419 Anxiety disorder, unspecified: Secondary | ICD-10-CM

## 2015-09-16 DIAGNOSIS — F32A Depression, unspecified: Secondary | ICD-10-CM | POA: Insufficient documentation

## 2015-09-16 LAB — CBC WITH DIFFERENTIAL/PLATELET
BASOS ABS: 0 {cells}/uL (ref 0–200)
Basophils Relative: 0 %
EOS PCT: 1 %
Eosinophils Absolute: 59 cells/uL (ref 15–500)
HCT: 42.7 % (ref 35.0–45.0)
HEMOGLOBIN: 14.5 g/dL (ref 11.7–15.5)
LYMPHS ABS: 1475 {cells}/uL (ref 850–3900)
Lymphocytes Relative: 25 %
MCH: 33.3 pg — AB (ref 27.0–33.0)
MCHC: 34 g/dL (ref 32.0–36.0)
MCV: 97.9 fL (ref 80.0–100.0)
MONOS PCT: 9 %
MPV: 11.5 fL (ref 7.5–12.5)
Monocytes Absolute: 531 cells/uL (ref 200–950)
NEUTROS PCT: 65 %
Neutro Abs: 3835 cells/uL (ref 1500–7800)
PLATELETS: 207 10*3/uL (ref 140–400)
RBC: 4.36 MIL/uL (ref 3.80–5.10)
RDW: 12.8 % (ref 11.0–15.0)
WBC: 5.9 10*3/uL (ref 3.8–10.8)

## 2015-09-16 MED ORDER — BUPROPION HCL ER (XL) 150 MG PO TB24: 150.0000 mg | ORAL_TABLET | Freq: Every day | ORAL | Status: DC

## 2015-09-16 MED ORDER — MELOXICAM 7.5 MG PO TABS: 7.5000 mg | ORAL_TABLET | Freq: Every day | ORAL | Status: DC | PRN

## 2015-09-16 MED ORDER — DICLOFENAC SODIUM 1 % TD GEL: 2.0000 g | Freq: Four times a day (QID) | TRANSDERMAL | Status: DC

## 2015-09-16 MED ORDER — TRAZODONE HCL 50 MG PO TABS: 50.0000 mg | ORAL_TABLET | Freq: Every evening | ORAL | Status: DC | PRN

## 2015-09-16 NOTE — Progress Notes (Signed)
Subjective:    Patient ID: Tracy Estrada, female    DOB: 23-May-1950, 65 y.o.   MRN: [id number]  09/16/2015  Insomnia   HPI This 65 y.o. female presents for ten month follow-up of the following chronic medical conditions:   Insomnia: taking Trazodone two at bedtime.  Sleeping well.  Groggy in morning.    Anxiety and depression:  Not taking Wellbutrin.  Got so depressed that stopped everything.  Mohter died age 79 in Feb 05, 2015.  Mother had been declining.  Stayed with mother for two years ago; dementia at the end.   Was severely depressed; now much less depressed.  Had Fourth of July celebration every year.   Husband with PTSD; sees psychiatrist at [place].    Fibromyalgia: seeing [facility]; only believes in supplements.  Joints are really bad; pain moves.  When sleeping and gets up, very stiff every morning; get in tub of water every morning with a lot of relief.     Review of Systems  Constitutional: Negative for fever, chills, diaphoresis and fatigue.  Eyes: Negative for visual disturbance.  Respiratory: Negative for cough and shortness of breath.   Cardiovascular: Negative for chest pain, palpitations and leg swelling.  Gastrointestinal: Negative for nausea, vomiting, abdominal pain, diarrhea and constipation.  Endocrine: Negative for cold intolerance, heat intolerance, polydipsia, polyphagia and polyuria.  Musculoskeletal: Positive for arthralgias, joint swelling and myalgias.  Neurological: Negative for dizziness, tremors, seizures, syncope, facial asymmetry, speech difficulty, weakness, light-headedness, numbness and headaches.  Psychiatric/Behavioral: Positive for dysphoric mood. Negative for sleep disturbance. The patient is nervous/anxious.     Past Medical History:  Diagnosis Date  . Achilles bursitis or tendinitis   . Anxiety state, unspecified   . Arthritis   . Closed fracture of unspecified part of fibula   . Depressive disorder, not elsewhere classified   .  Diverticulosis of colon (without mention of hemorrhage)   . Edema   . Fibromyalgia   . Insomnia, unspecified   . Lumbago   . Meniere's disease 1986  . MVP (mitral valve prolapse)   . Pain in joint, site unspecified   . Peptic ulcer    no testing  . Pure hypercholesterolemia   . TSH elevation    Past Surgical History:  Procedure Laterality Date  . ABDOMINAL HYSTERECTOMY  03/08/1980   fibroids; ovaries intact.   Allergies  Allergen Reactions  . Demerol [Meperidine] Nausea And Vomiting    Social History   Social History  . Marital status: Married    Spouse name: N/A  . Number of children: N/A  . Years of education: N/A   Occupational History  . retired    Social History Main Topics  . Smoking status: Never Smoker  . Smokeless tobacco: Never Used  . Alcohol use 0.0 oz/week     Comment: 3-4 beers per week or wine  . Drug use: No  . Sexual activity: Yes   Other Topics Concern  . Not on file   Social History Narrative   Marital status:  Married x 13 years; second marriage;happily married,husband has PTSD.     Children: 2 daughters [name], [name]); 7 grandchildren; 2 gg      Lives: with husband      Tobacco: never      Alcohol: weekends; 3-4 glasses of wine per week or beer.      Drugs:  None      Exercise: walking 45 minutes per day.      Seatbelt: 100%  Guns: unloaded.     Caffeine not every day. 7 grandchildren and 2 GG. Organ donor NO.      Family History  Problem Relation Age of Onset  . Hyperlipidemia Mother   . Hypertension Mother   . Colon polyps Mother   . Breast cancer Sister 47    with metastasis to bone; smoker  . Coronary artery disease Father      AMI age 12  . Heart disease Father 5    AMI x 2; first AMI in 86s  . Stroke Maternal Grandmother   . Heart disease Paternal Grandfather   . Heart attack Paternal Grandfather   . Colon cancer Maternal Uncle     dx. >50  . Colon cancer Paternal Aunt     dx. 70s  . Heart attack Paternal Uncle     . Alzheimer's disease Paternal Grandmother   . Breast cancer Cousin   . Ovarian cancer Cousin     dx. late 70s/dx. 60s  . Lung cancer Cousin     dx. 51s  . Stomach cancer Other        Objective:    BP 126/62   Pulse 75   Temp 98.6 F (37 C) (Oral)   Resp 16   Ht 5' 1.25" (1.556 m)   Wt 126 lb 12.8 oz (57.5 kg)   SpO2 98%   BMI 23.76 kg/m  Physical Exam  Constitutional: She is oriented to person, place, and time. She appears well-developed and well-nourished. No distress.  HENT:  Head: Normocephalic and atraumatic.  Right Ear: External ear normal.  Left Ear: External ear normal.  Nose: Nose normal.  Mouth/Throat: Oropharynx is clear and moist.  Eyes: Conjunctivae and EOM are normal. Pupils are equal, round, and reactive to light.  Neck: Normal range of motion. Neck supple. Carotid bruit is not present. No thyromegaly present.  Cardiovascular: Normal rate, regular rhythm, normal heart sounds and intact distal pulses.  Exam reveals no gallop and no friction rub.   No murmur heard. Pulmonary/Chest: Effort normal and breath sounds normal. She has no wheezes. She has no rales.  Abdominal: Soft. Bowel sounds are normal. She exhibits no distension and no mass. There is no tenderness. There is no rebound and no guarding.  Lymphadenopathy:    She has no cervical adenopathy.  Neurological: She is alert and oriented to person, place, and time. No cranial nerve deficit.  Skin: Skin is warm and dry. No rash noted. She is not diaphoretic. No erythema. No pallor.  Psychiatric: She has a normal mood and affect. Her behavior is normal.  Nursing note and vitals reviewed.       Assessment & Plan:   1. Pure hypercholesterolemia   2. Fibromyalgia   3. Anxiety and depression   4. Insomnia   5. Family history of ovarian cancer   6. Family history of breast cancer in sister   42. Family history of colon cancer    -stable. -obtain labs. -refill of medications; rx for Diclofenac gel  and Mobic provided for PRN use for arthritic and fibromyalgia exacerbations. -refer to rheumatology per patient request. -restart Wellbutrin for anxiety and depression  Orders Placed This Encounter  Procedures  . Lipid panel    Order Specific Question:   Has the patient fasted?    Answer:   Yes  . Comprehensive metabolic panel    Order Specific Question:   Has the patient fasted?    Answer:   Yes  . CBC with  Differential/Platelet  . Ambulatory referral to Rheumatology    Referral Priority:   Routine    Referral Type:   Consultation    Referral Reason:   Specialty Services Required    Requested Specialty:   Rheumatology    Number of Visits Requested:   1   Meds ordered this encounter  Medications  . buPROPion (WELLBUTRIN XL) 150 MG 24 hr tablet    Sig: Take 1 tablet (150 mg total) by mouth daily.    Dispense:  90 tablet    Refill:  1  . traZODone (DESYREL) 50 MG tablet    Sig: Take 1-2 tablets (50-100 mg total) by mouth at bedtime as needed for sleep.    Dispense:  60 tablet    Refill:  11  . meloxicam (MOBIC) 7.5 MG tablet    Sig: Take 1 tablet (7.5 mg total) by mouth daily as needed for pain.    Dispense:  30 tablet    Refill:  1  . DISCONTD: diclofenac sodium (VOLTAREN) 1 % GEL    Sig: Apply 2 g topically 4 (four) times daily.    Dispense:  100 g    Refill:  1    Return in about 6 months (around 03/18/2016) for complete physical examiniation.    Galia Rahm Elayne Guerin, M.D. [facility] [address] [place], [place]  [address] [phone] phone [phone] fax

## 2015-09-16 NOTE — Patient Instructions (Signed)
     IF you received an x-ray today, you will receive an invoice from [facility]. Please contact [facility] at [phone] with questions or concerns regarding your invoice.   IF you received labwork today, you will receive an invoice from [facility]. Please contact [facility] at [phone] with questions or concerns regarding your invoice.   Our billing staff will not be able to assist you with questions regarding bills from these companies.  You will be contacted with the lab results as soon as they are available. The fastest way to get your results is to activate your My Chart account. Instructions are located on the last page of this paperwork. If you have not heard from us regarding the results in 2 weeks, please contact this office.      

## 2015-09-17 LAB — LIPID PANEL
CHOLESTEROL: 214 mg/dL — AB (ref 125–200)
HDL: 65 mg/dL (ref >=46)
LDL CALC: 132 mg/dL — AB (ref <130)
TRIGLYCERIDES: 85 mg/dL (ref <150)
Total CHOL/HDL Ratio: 3.3 Ratio (ref <=5.0)
VLDL: 17 mg/dL (ref <30)

## 2015-09-17 LAB — COMPREHENSIVE METABOLIC PANEL
ALK PHOS: 55 U/L (ref 33–130)
ALT: 23 U/L (ref 6–29)
AST: 24 U/L (ref 10–35)
Albumin: 4.6 g/dL (ref 3.6–5.1)
BILIRUBIN TOTAL: 0.6 mg/dL (ref 0.2–1.2)
BUN: 13 mg/dL (ref 7–25)
CALCIUM: 9.7 mg/dL (ref 8.6–10.4)
CO2: 27 mmol/L (ref 20–31)
Chloride: 102 mmol/L (ref 98–110)
Creat: 0.62 mg/dL (ref 0.50–0.99)
GLUCOSE: 79 mg/dL (ref 65–99)
POTASSIUM: 4 mmol/L (ref 3.5–5.3)
Sodium: 142 mmol/L (ref 135–146)
TOTAL PROTEIN: 6.9 g/dL (ref 6.1–8.1)

## 2015-09-22 ENCOUNTER — Encounter: Payer: Self-pay | Admitting: Family Medicine

## 2015-09-25 ENCOUNTER — Ambulatory Visit (INDEPENDENT_AMBULATORY_CARE_PROVIDER_SITE_OTHER): Payer: Medicare Other

## 2015-09-25 ENCOUNTER — Ambulatory Visit (INDEPENDENT_AMBULATORY_CARE_PROVIDER_SITE_OTHER): Payer: Medicare Other | Admitting: Family Medicine

## 2015-09-25 VITALS — BP 130/62 | HR 77 | Temp 98.9°F | Resp 16 | Ht 62.0 in | Wt 126.0 lb

## 2015-09-25 DIAGNOSIS — M5431 Sciatica, right side: Secondary | ICD-10-CM

## 2015-09-25 DIAGNOSIS — M5136 Other intervertebral disc degeneration, lumbar region: Secondary | ICD-10-CM | POA: Diagnosis not present

## 2015-09-25 MED ORDER — PREDNISONE 20 MG PO TABS: ORAL_TABLET | ORAL | Status: DC

## 2015-09-25 MED ORDER — TIZANIDINE HCL 4 MG PO CAPS: 4.0000 mg | ORAL_CAPSULE | Freq: Three times a day (TID) | ORAL | Status: DC | PRN

## 2015-09-25 NOTE — Progress Notes (Signed)
Subjective:    Patient ID: Tracy Estrada, female    DOB: 08-26-1950, 65 y.o.   MRN: [id number]  09/25/2015  Leg Pain (Right leg x 5 days); Buttocks pain (Right sided); and Numbness (Right leg)   HPI This 65 y.o. female presents for evaluation of leg pain, buttocks pain, numbness.  Onset five days ago. No injury; no overuse; no change in activity.  R leg pain that radiates into buttocks and into foot.  +numbness in R leg.  No saddle paresthesias. No bowel or bladder dysfunction.     Review of Systems  Constitutional: Negative for fever, chills, diaphoresis and fatigue.  Eyes: Negative for visual disturbance.  Respiratory: Negative for cough and shortness of breath.   Cardiovascular: Negative for chest pain, palpitations and leg swelling.  Gastrointestinal: Negative for nausea, vomiting, abdominal pain, diarrhea and constipation.  Endocrine: Negative for cold intolerance, heat intolerance, polydipsia, polyphagia and polyuria.  Genitourinary: Negative for decreased urine volume and difficulty urinating.  Musculoskeletal: Positive for back pain and myalgias. Negative for gait problem.  Neurological: Positive for numbness. Negative for dizziness, tremors, seizures, syncope, facial asymmetry, speech difficulty, weakness, light-headedness and headaches.    Past Medical History:  Diagnosis Date  . Achilles bursitis or tendinitis   . Anxiety state, unspecified   . Arthritis   . Closed fracture of unspecified part of fibula   . Depressive disorder, not elsewhere classified   . Diverticulosis of colon (without mention of hemorrhage)   . Edema   . Fibromyalgia   . Insomnia, unspecified   . Lumbago   . Meniere's disease 1986  . MVP (mitral valve prolapse)   . Pain in joint, site unspecified   . Peptic ulcer    no testing  . Pure hypercholesterolemia   . TSH elevation    Past Surgical History:  Procedure Laterality Date  . ABDOMINAL HYSTERECTOMY  03/08/1980   fibroids; ovaries intact.    Allergies  Allergen Reactions  . Demerol [Meperidine] Nausea And Vomiting    Social History   Social History  . Marital status: Married    Spouse name: N/A  . Number of children: N/A  . Years of education: N/A   Occupational History  . retired    Social History Main Topics  . Smoking status: Never Smoker  . Smokeless tobacco: Never Used  . Alcohol use 0.0 oz/week     Comment: 3-4 beers per week or wine  . Drug use: No  . Sexual activity: Yes   Other Topics Concern  . Not on file   Social History Narrative   Marital status:  Married x 13 years; second marriage;happily married,husband has PTSD.     Children: 2 daughters [name], [name]); 7 grandchildren; 2 gg      Lives: with husband      Tobacco: never      Alcohol: weekends; 3-4 glasses of wine per week or beer.      Drugs:  None      Exercise: walking 45 minutes per day.      Seatbelt: 100%      Guns: unloaded.     Caffeine not every day. 7 grandchildren and 2 GG. Organ donor NO.      Family History  Problem Relation Age of Onset  . Hyperlipidemia Mother   . Hypertension Mother   . Colon polyps Mother   . Breast cancer Sister 32    with metastasis to bone; smoker  . Coronary artery disease Father  AMI age 47  . Heart disease Father 15    AMI x 2; first AMI in 70s  . Stroke Maternal Grandmother   . Heart disease Paternal Grandfather   . Heart attack Paternal Grandfather   . Colon cancer Maternal Uncle     dx. >50  . Colon cancer Paternal Aunt     dx. 28s  . Heart attack Paternal Uncle   . Alzheimer's disease Paternal Grandmother   . Breast cancer Cousin   . Ovarian cancer Cousin     dx. late 70s/dx. 60s  . Lung cancer Cousin     dx. 87s  . Stomach cancer Other        Objective:    BP 130/62   Pulse 77   Temp 98.9 F (37.2 C) (Oral)   Resp 16   Ht 5\' 2"  (1.575 m)   Wt 126 lb (57.2 kg)   SpO2 97%   BMI 23.05 kg/m  Physical Exam  Constitutional: She is oriented to person, place, and  time. She appears well-developed and well-nourished. No distress.  HENT:  Head: Normocephalic and atraumatic.  Right Ear: External ear normal.  Left Ear: External ear normal.  Nose: Nose normal.  Mouth/Throat: Oropharynx is clear and moist.  Eyes: Conjunctivae and EOM are normal. Pupils are equal, round, and reactive to light.  Neck: Normal range of motion. Neck supple. Carotid bruit is not present. No thyromegaly present.  Cardiovascular: Normal rate, regular rhythm, normal heart sounds and intact distal pulses.  Exam reveals no gallop and no friction rub.   No murmur heard. Pulmonary/Chest: Effort normal and breath sounds normal. She has no wheezes. She has no rales.  Abdominal: Soft. Bowel sounds are normal. She exhibits no distension and no mass. There is no tenderness. There is no rebound and no guarding.  Musculoskeletal:       Right hip: Normal. She exhibits normal range of motion, normal strength, no tenderness and no bony tenderness.       Right knee: Normal. She exhibits normal range of motion. No tenderness found.       Right ankle: Normal. She exhibits normal range of motion. No tenderness. No lateral malleolus tenderness found.       Lumbar back: She exhibits pain. She exhibits normal range of motion, no tenderness, no bony tenderness, no spasm and normal pulse.  Lymphadenopathy:    She has no cervical adenopathy.  Neurological: She is alert and oriented to person, place, and time. No cranial nerve deficit.  Skin: Skin is warm and dry. No rash noted. She is not diaphoretic. No erythema. No pallor.  Psychiatric: She has a normal mood and affect. Her behavior is normal.        Assessment & Plan:   1. Right sided sciatica    -New. -Rx for Prednisone, Zanaflex. -home exercise program provided. -If no improvement in two weeks, call for ortho referral and/or PT referral.   Orders Placed This Encounter  Procedures  . DG Lumbar Spine Complete    Standing Status:   Future      Number of Occurrences:   1    Standing Expiration Date:   09/24/2016    Order Specific Question:   Reason for Exam (SYMPTOM  OR DIAGNOSIS REQUIRED)    Answer:   r sciatica    Order Specific Question:   Preferred imaging location?    Answer:   External   Meds ordered this encounter  Medications  . predniSONE (DELTASONE) 20  MG tablet    Sig: Three tablets daily x 2 days then two tablets daily x 5 days then one tablet daily x 5 days    Dispense:  21 tablet    Refill:  0  . tiZANidine (ZANAFLEX) 4 MG capsule    Sig: Take 1 capsule (4 mg total) by mouth 3 (three) times daily as needed for muscle spasms.    Dispense:  45 capsule    Refill:  0    No Follow-up on file.    Jerney Baksh Elayne Guerin, M.D. [facility] [address] [place], [place]  [address] [phone] phone [phone] fax

## 2015-09-25 NOTE — Patient Instructions (Addendum)
   IF you received an x-ray today, you will receive an invoice from [facility]. Please contact  Radiology at [phone] with questions or concerns regarding your invoice.   IF you received labwork today, you will receive an invoice from [facility]. Please contact [facility] at [phone] with questions or concerns regarding your invoice.   Our billing staff will not be able to assist you with questions regarding bills from these companies.  You will be contacted with the lab results as soon as they are available. The fastest way to get your results is to activate your My Chart account. Instructions are located on the last page of this paperwork. If you have not heard from us regarding the results in 2 weeks, please contact this office.      Sciatica With Rehab The sciatic nerve runs from the back down the leg and is responsible for sensation and control of the muscles in the back (posterior) side of the thigh, lower leg, and foot. Sciatica is a condition that is characterized by inflammation of this nerve.  SYMPTOMS   Signs of nerve damage, including numbness and/or weakness along the posterior side of the lower extremity.  Pain in the back of the thigh that may also travel down the leg.  Pain that worsens when sitting for long periods of time.  Occasionally, pain in the back or buttock. CAUSES  Inflammation of the sciatic nerve is the cause of sciatica. The inflammation is due to something irritating the nerve. Common sources of irritation include:  Sitting for long periods of time.  Direct trauma to the nerve.  Arthritis of the spine.  Herniated or ruptured disk.  Slipping of the vertebrae (spondylolisthesis).  Pressure from soft tissues, such as muscles or ligament-like tissue (fascia). RISK INCREASES WITH:  Sports that place pressure or stress on the spine (football or weightlifting).  Poor strength and  flexibility.  Failure to warm up properly before activity.  Family history of low back pain or disk disorders.  Previous back injury or surgery.  Poor body mechanics, especially when lifting, or poor posture. PREVENTION   Warm up and stretch properly before activity.  Maintain physical fitness:  Strength, flexibility, and endurance.  Cardiovascular fitness.  Learn and use proper technique, especially with posture and lifting. When possible, have coach correct improper technique.  Avoid activities that place stress on the spine. PROGNOSIS If treated properly, then sciatica usually resolves within 6 weeks. However, occasionally surgery is necessary.  RELATED COMPLICATIONS   Permanent nerve damage, including pain, numbness, tingle, or weakness.  Chronic back pain.  Risks of surgery: infection, bleeding, nerve damage, or damage to surrounding tissues. TREATMENT Treatment initially involves resting from any activities that aggravate your symptoms. The use of ice and medication may help reduce pain and inflammation. The use of strengthening and stretching exercises may help reduce pain with activity. These exercises may be performed at home or with referral to a therapist. A therapist may recommend further treatments, such as transcutaneous electronic nerve stimulation (TENS) or ultrasound. Your caregiver may recommend corticosteroid injections to help reduce inflammation of the sciatic nerve. If symptoms persist despite non-surgical (conservative) treatment, then surgery may be recommended. MEDICATION  If pain medication is necessary, then nonsteroidal anti-inflammatory medications, such as aspirin and ibuprofen, or other minor pain relievers, such as acetaminophen, are often recommended.  Do not take pain medication for 7 days before surgery.  Prescription pain relievers may be given if deemed necessary by your   caregiver. Use only as directed and only as much as you  need.  Ointments applied to the skin may be helpful.  Corticosteroid injections may be given by your caregiver. These injections should be reserved for the most serious cases, because they may only be given a certain number of times. HEAT AND COLD  Cold treatment (icing) relieves pain and reduces inflammation. Cold treatment should be applied for 10 to 15 minutes every 2 to 3 hours for inflammation and pain and immediately after any activity that aggravates your symptoms. Use ice packs or massage the area with a piece of ice (ice massage).  Heat treatment may be used prior to performing the stretching and strengthening activities prescribed by your caregiver, physical therapist, or athletic trainer. Use a heat pack or soak the injury in warm water. SEEK MEDICAL CARE IF:  Treatment seems to offer no benefit, or the condition worsens.  Any medications produce adverse side effects. EXERCISES  RANGE OF MOTION (ROM) AND STRETCHING EXERCISES - Sciatica Most people with sciatic will find that their symptoms worsen with either excessive bending forward (flexion) or arching at the low back (extension). The exercises which will help resolve your symptoms will focus on the opposite motion. Your physician, physical therapist or athletic trainer will help you determine which exercises will be most helpful to resolve your low back pain. Do not complete any exercises without first consulting with your clinician. Discontinue any exercises which worsen your symptoms until you speak to your clinician. If you have pain, numbness or tingling which travels down into your buttocks, leg or foot, the goal of the therapy is for these symptoms to move closer to your back and eventually resolve. Occasionally, these leg symptoms will get better, but your low back pain may worsen; this is typically an indication of progress in your rehabilitation. Be certain to be very alert to any changes in your symptoms and the activities in  which you participated in the 24 hours prior to the change. Sharing this information with your clinician will allow him/her to most efficiently treat your condition. These exercises may help you when beginning to rehabilitate your injury. Your symptoms may resolve with or without further involvement from your physician, physical therapist or athletic trainer. While completing these exercises, remember:   Restoring tissue flexibility helps normal motion to return to the joints. This allows healthier, less painful movement and activity.  An effective stretch should be held for at least 30 seconds.  A stretch should never be painful. You should only feel a gentle lengthening or release in the stretched tissue. FLEXION RANGE OF MOTION AND STRETCHING EXERCISES: STRETCH - Flexion, Single Knee to Chest   Lie on a firm bed or floor with both legs extended in front of you.  Keeping one leg in contact with the floor, bring your opposite knee to your chest. Hold your leg in place by either grabbing behind your thigh or at your knee.  Pull until you feel a gentle stretch in your low back. Hold __________ seconds.  Slowly release your grasp and repeat the exercise with the opposite side. Repeat __________ times. Complete this exercise __________ times per day.  STRETCH - Flexion, Double Knee to Chest  Lie on a firm bed or floor with both legs extended in front of you.  Keeping one leg in contact with the floor, bring your opposite knee to your chest.  Tense your stomach muscles to support your back and then lift your other knee to   your chest. Hold your legs in place by either grabbing behind your thighs or at your knees.  Pull both knees toward your chest until you feel a gentle stretch in your low back. Hold __________ seconds.  Tense your stomach muscles and slowly return one leg at a time to the floor. Repeat __________ times. Complete this exercise __________ times per day.  STRETCH - Low Trunk  Rotation   Lie on a firm bed or floor. Keeping your legs in front of you, bend your knees so they are both pointed toward the ceiling and your feet are flat on the floor.  Extend your arms out to the side. This will stabilize your upper body by keeping your shoulders in contact with the floor.  Gently and slowly drop both knees together to one side until you feel a gentle stretch in your low back. Hold for __________ seconds.  Tense your stomach muscles to support your low back as you bring your knees back to the starting position. Repeat the exercise to the other side. Repeat __________ times. Complete this exercise __________ times per day  EXTENSION RANGE OF MOTION AND FLEXIBILITY EXERCISES: STRETCH - Extension, Prone on Elbows  Lie on your stomach on the floor, a bed will be too soft. Place your palms about shoulder width apart and at the height of your head.  Place your elbows under your shoulders. If this is too painful, stack pillows under your chest.  Allow your body to relax so that your hips drop lower and make contact more completely with the floor.  Hold this position for __________ seconds.  Slowly return to lying flat on the floor. Repeat __________ times. Complete this exercise __________ times per day.  RANGE OF MOTION - Extension, Prone Press Ups  Lie on your stomach on the floor, a bed will be too soft. Place your palms about shoulder width apart and at the height of your head.  Keeping your back as relaxed as possible, slowly straighten your elbows while keeping your hips on the floor. You may adjust the placement of your hands to maximize your comfort. As you gain motion, your hands will come more underneath your shoulders.  Hold this position __________ seconds.  Slowly return to lying flat on the floor. Repeat __________ times. Complete this exercise __________ times per day.  STRENGTHENING EXERCISES - Sciatica  These exercises may help you when beginning to  rehabilitate your injury. These exercises should be done near your "sweet spot." This is the neutral, low-back arch, somewhere between fully rounded and fully arched, that is your least painful position. When performed in this safe range of motion, these exercises can be used for people who have either a flexion or extension based injury. These exercises may resolve your symptoms with or without further involvement from your physician, physical therapist or athletic trainer. While completing these exercises, remember:   Muscles can gain both the endurance and the strength needed for everyday activities through controlled exercises.  Complete these exercises as instructed by your physician, physical therapist or athletic trainer. Progress with the resistance and repetition exercises only as your caregiver advises.  You may experience muscle soreness or fatigue, but the pain or discomfort you are trying to eliminate should never worsen during these exercises. If this pain does worsen, stop and make certain you are following the directions exactly. If the pain is still present after adjustments, discontinue the exercise until you can discuss the trouble with your clinician. STRENGTHENING - Deep   Abdominals, Pelvic Tilt   Lie on a firm bed or floor. Keeping your legs in front of you, bend your knees so they are both pointed toward the ceiling and your feet are flat on the floor.  Tense your lower abdominal muscles to press your low back into the floor. This motion will rotate your pelvis so that your tail bone is scooping upwards rather than pointing at your feet or into the floor.  With a gentle tension and even breathing, hold this position for __________ seconds. Repeat __________ times. Complete this exercise __________ times per day.  STRENGTHENING - Abdominals, Crunches   Lie on a firm bed or floor. Keeping your legs in front of you, bend your knees so they are both pointed toward the ceiling and  your feet are flat on the floor. Cross your arms over your chest.  Slightly tip your chin down without bending your neck.  Tense your abdominals and slowly lift your trunk high enough to just clear your shoulder blades. Lifting higher can put excessive stress on the low back and does not further strengthen your abdominal muscles.  Control your return to the starting position. Repeat __________ times. Complete this exercise __________ times per day.  STRENGTHENING - Quadruped, Opposite UE/LE Lift  Assume a hands and knees position on a firm surface. Keep your hands under your shoulders and your knees under your hips. You may place padding under your knees for comfort.  Find your neutral spine and gently tense your abdominal muscles so that you can maintain this position. Your shoulders and hips should form a rectangle that is parallel with the floor and is not twisted.  Keeping your trunk steady, lift your right hand no higher than your shoulder and then your left leg no higher than your hip. Make sure you are not holding your breath. Hold this position __________ seconds.  Continuing to keep your abdominal muscles tense and your back steady, slowly return to your starting position. Repeat with the opposite arm and leg. Repeat __________ times. Complete this exercise __________ times per day.  STRENGTHENING - Abdominals and Quadriceps, Straight Leg Raise   Lie on a firm bed or floor with both legs extended in front of you.  Keeping one leg in contact with the floor, bend the other knee so that your foot can rest flat on the floor.  Find your neutral spine, and tense your abdominal muscles to maintain your spinal position throughout the exercise.  Slowly lift your straight leg off the floor about 6 inches for a count of 15, making sure to not hold your breath.  Still keeping your neutral spine, slowly lower your leg all the way to the floor. Repeat this exercise with each leg __________  times. Complete this exercise __________ times per day. POSTURE AND BODY MECHANICS CONSIDERATIONS - Sciatica Keeping correct posture when sitting, standing or completing your activities will reduce the stress put on different body tissues, allowing injured tissues a chance to heal and limiting painful experiences. The following are general guidelines for improved posture. Your physician or physical therapist will provide you with any instructions specific to your needs. While reading these guidelines, remember:  The exercises prescribed by your provider will help you have the flexibility and strength to maintain correct postures.  The correct posture provides the optimal environment for your joints to work. All of your joints have less wear and tear when properly supported by a spine with good posture. This means you will experience   a healthier, less painful body.  Correct posture must be practiced with all of your activities, especially prolonged sitting and standing. Correct posture is as important when doing repetitive low-stress activities (typing) as it is when doing a single heavy-load activity (lifting). RESTING POSITIONS Consider which positions are most painful for you when choosing a resting position. If you have pain with flexion-based activities (sitting, bending, stooping, squatting), choose a position that allows you to rest in a less flexed posture. You would want to avoid curling into a fetal position on your side. If your pain worsens with extension-based activities (prolonged standing, working overhead), avoid resting in an extended position such as sleeping on your stomach. Most people will find more comfort when they rest with their spine in a more neutral position, neither too rounded nor too arched. Lying on a non-sagging bed on your side with a pillow between your knees, or on your back with a pillow under your knees will often provide some relief. Keep in mind, being in any one  position for a prolonged period of time, no matter how correct your posture, can still lead to stiffness. PROPER SITTING POSTURE In order to minimize stress and discomfort on your spine, you must sit with correct posture Sitting with good posture should be effortless for a healthy body. Returning to good posture is a gradual process. Many people can work toward this most comfortably by using various supports until they have the flexibility and strength to maintain this posture on their own. When sitting with proper posture, your ears will fall over your shoulders and your shoulders will fall over your hips. You should use the back of the chair to support your upper back. Your low back will be in a neutral position, just slightly arched. You may place a small pillow or folded towel at the base of your low back for support.  When working at a desk, create an environment that supports good, upright posture. Without extra support, muscles fatigue and lead to excessive strain on joints and other tissues. Keep these recommendations in mind: CHAIR:   A chair should be able to slide under your desk when your back makes contact with the back of the chair. This allows you to work closely.  The chair's height should allow your eyes to be level with the upper part of your monitor and your hands to be slightly lower than your elbows. BODY POSITION  Your feet should make contact with the floor. If this is not possible, use a foot rest.  Keep your ears over your shoulders. This will reduce stress on your neck and low back. INCORRECT SITTING POSTURES   If you are feeling tired and unable to assume a healthy sitting posture, do not slouch or slump. This puts excessive strain on your back tissues, causing more damage and pain. Healthier options include:  Using more support, like a lumbar pillow.  Switching tasks to something that requires you to be upright or walking.  Talking a brief walk.  Lying down to  rest in a neutral-spine position. PROLONGED STANDING WHILE SLIGHTLY LEANING FORWARD  When completing a task that requires you to lean forward while standing in one place for a long time, place either foot up on a stationary 2-4 inch high object to help maintain the best posture. When both feet are on the ground, the low back tends to lose its slight inward curve. If this curve flattens (or becomes too large), then the back and your other   joints will experience too much stress, fatigue more quickly and can cause pain.  CORRECT STANDING POSTURES Proper standing posture should be assumed with all daily activities, even if they only take a few moments, like when brushing your teeth. As in sitting, your ears should fall over your shoulders and your shoulders should fall over your hips. You should keep a slight tension in your abdominal muscles to brace your spine. Your tailbone should point down to the ground, not behind your body, resulting in an over-extended swayback posture.  INCORRECT STANDING POSTURES  Common incorrect standing postures include a forward head, locked knees and/or an excessive swayback. WALKING Walk with an upright posture. Your ears, shoulders and hips should all line-up. PROLONGED ACTIVITY IN A FLEXED POSITION When completing a task that requires you to bend forward at your waist or lean over a low surface, try to find a way to stabilize 3 of 4 of your limbs. You can place a hand or elbow on your thigh or rest a knee on the surface you are reaching across. This will provide you more stability so that your muscles do not fatigue as quickly. By keeping your knees relaxed, or slightly bent, you will also reduce stress across your low back. CORRECT LIFTING TECHNIQUES DO :   Assume a wide stance. This will provide you more stability and the opportunity to get as close as possible to the object which you are lifting.  Tense your abdominals to brace your spine; then bend at the knees and  hips. Keeping your back locked in a neutral-spine position, lift using your leg muscles. Lift with your legs, keeping your back straight.  Test the weight of unknown objects before attempting to lift them.  Try to keep your elbows locked down at your sides in order get the best strength from your shoulders when carrying an object.  Always ask for help when lifting heavy or awkward objects. INCORRECT LIFTING TECHNIQUES DO NOT:   Lock your knees when lifting, even if it is a small object.  Bend and twist. Pivot at your feet or move your feet when needing to change directions.  Assume that you cannot safely pick up a paperclip without proper posture.   This information is not intended to replace advice given to you by your health care provider. Make sure you discuss any questions you have with your health care provider.   Document Released: 02/22/2005 Document Revised: 07/09/2014 Document Reviewed: 06/06/2008 [employer] Interactive Patient Education 2016 [employer]  

## 2015-10-13 DIAGNOSIS — Z872 Personal history of diseases of the skin and subcutaneous tissue: Secondary | ICD-10-CM | POA: Diagnosis not present

## 2015-10-13 DIAGNOSIS — Z1283 Encounter for screening for malignant neoplasm of skin: Secondary | ICD-10-CM | POA: Diagnosis not present

## 2015-10-13 DIAGNOSIS — L821 Other seborrheic keratosis: Secondary | ICD-10-CM | POA: Diagnosis not present

## 2015-10-15 ENCOUNTER — Ambulatory Visit (INDEPENDENT_AMBULATORY_CARE_PROVIDER_SITE_OTHER): Payer: Medicare Other | Admitting: Family Medicine

## 2015-10-15 VITALS — BP 126/82 | HR 126 | Temp 98.6°F | Resp 18 | Ht 62.0 in | Wt 131.0 lb

## 2015-10-15 DIAGNOSIS — M5431 Sciatica, right side: Secondary | ICD-10-CM | POA: Diagnosis not present

## 2015-10-15 MED ORDER — GABAPENTIN 100 MG PO CAPS: 100.0000 mg | ORAL_CAPSULE | Freq: Four times a day (QID) | ORAL | 0 refills | Status: DC

## 2015-10-15 MED ORDER — TRAMADOL HCL 50 MG PO TABS: 50.0000 mg | ORAL_TABLET | Freq: Four times a day (QID) | ORAL | 0 refills | Status: DC | PRN

## 2015-10-15 NOTE — Progress Notes (Signed)
Patient ID: Tracy Estrada, female   DOB: Apr 25, 1950, 65 y.o.   MRN: [id number]   Subjective:  By signing my name below, I, Tracy Estrada, attest that this documentation has been prepared under the direction and in the presence of Tracy Forts, MD. Electronically Signed: Moises Estrada, [place]. 10/15/2015, 4:52 PM .  Patient was seen in Room 2 .   Patient ID: Tracy Estrada, female    DOB: December 28, 1950, 65 y.o.   MRN: [id number]  10/15/2015  Follow-up ([facility])   HPI  I saw patient on 7/20 for right sciatica. Patient had xray of L-spine, which showed mild degenerative disc space narrowing centered at L1-2 and L2-3 without compression fracture or spondylolisthesis. She was prescribed prednisone and zanaflex, as well as exercises to do at home.   Patient returns to the clinic for continuous right sciatic pain with some tingling and burning. She's taken all of her prednisone that was prescribed without relief. She states the zanaflex made her really sleepy. She's tried to do some of the exercises advised, but not every day. She states it hurts to stand, hurts to sit, hurt to lay down and difficult to turn over in bed. She's also been applying voltaren gel over the area, as well as taking her meloxicam. She denies any urinary symptoms, diarrhea, constipation, numbness or weakness.   She's planning to travel to [place] with her family next week.   Review of Systems  Constitutional: Negative for chills, fatigue and fever.  Gastrointestinal: Negative for constipation, diarrhea, nausea and vomiting.  Genitourinary: Negative for decreased urine volume, difficulty urinating, dysuria and frequency.  Musculoskeletal: Positive for arthralgias, back pain and myalgias.  Skin: Negative for rash and wound.  Neurological: Negative for weakness and numbness.    Past Medical History:  Diagnosis Date  . Achilles bursitis or tendinitis   . Anxiety state, unspecified   . Arthritis   . Closed fracture of  unspecified part of fibula   . Depressive disorder, not elsewhere classified   . Diverticulosis of colon (without mention of hemorrhage)   . Edema   . Fibromyalgia   . Insomnia, unspecified   . Lumbago   . Meniere's disease 1986  . MVP (mitral valve prolapse)   . Pain in joint, site unspecified   . Peptic ulcer    no testing  . Pure hypercholesterolemia   . TSH elevation    Past Surgical History:  Procedure Laterality Date  . ABDOMINAL HYSTERECTOMY  03/08/1980   fibroids; ovaries intact.   Allergies  Allergen Reactions  . Demerol [Meperidine] Nausea And Vomiting    Social History   Social History  . Marital status: Married    Spouse name: N/A  . Number of children: N/A  . Years of education: N/A   Occupational History  . retired    Social History Main Topics  . Smoking status: Never Smoker  . Smokeless tobacco: Never Used  . Alcohol use 0.0 oz/week     Comment: 3-4 beers per week or wine  . Drug use: No  . Sexual activity: Yes   Other Topics Concern  . Not on file   Social History Narrative   Marital status:  Married x 13 years; second marriage;happily married,husband has PTSD.     Children: 2 daughters [name], [name]); 7 grandchildren; 2 gg      Lives: with husband      Tobacco: never      Alcohol: weekends; 3-4 glasses of wine per week or beer.  Drugs:  None      Exercise: walking 45 minutes per day.      Seatbelt: 100%      Guns: unloaded.     Caffeine not every day. 7 grandchildren and 2 GG. Organ donor NO.      Family History  Problem Relation Age of Onset  . Hyperlipidemia Mother   . Hypertension Mother   . Colon polyps Mother   . Breast cancer Sister 78    with metastasis to bone; smoker  . Coronary artery disease Father      AMI age 67  . Heart disease Father 16    AMI x 2; first AMI in 68s  . Stroke Maternal Grandmother   . Heart disease Paternal Grandfather   . Heart attack Paternal Grandfather   . Colon cancer Maternal Uncle     dx.  >50  . Colon cancer Paternal Aunt     dx. 44s  . Heart attack Paternal Uncle   . Alzheimer's disease Paternal Grandmother   . Breast cancer Cousin   . Ovarian cancer Cousin     dx. late 70s/dx. 60s  . Lung cancer Cousin     dx. 22s  . Stomach cancer Other        Objective:    BP 126/82 (BP Location: Right Arm, Patient Position: Sitting, Cuff Size: Small)   Pulse (!) 126   Temp 98.6 F (37 C) (Oral)   Resp 18   Ht 5\' 2"  (1.575 m)   Wt 131 lb (59.4 kg)   SpO2 95%   BMI 23.96 kg/m   Physical Exam  Constitutional: She is oriented to person, place, and time. She appears well-developed and well-nourished. No distress.  HENT:  Head: Normocephalic and atraumatic.  Eyes: Conjunctivae and EOM are normal. Pupils are equal, round, and reactive to light.  Neck: Normal range of motion. Neck supple.  Cardiovascular: Normal rate, regular rhythm and normal heart sounds.  Exam reveals no gallop and no friction rub.   No murmur heard. Pulmonary/Chest: Effort normal and breath sounds normal. No respiratory distress. She has no wheezes. She has no rales.  Musculoskeletal: Normal range of motion.  Full ROM of lumbar spine, pain reproducible in all directions; marching was intact and heel-toe walk intact; motor 5/5. Right hip without pain; full ROM hip.  Neurological: She is alert and oriented to person, place, and time.  Skin: Skin is warm and dry. She is not diaphoretic.  Psychiatric: She has a normal mood and affect. Her behavior is normal.  Nursing note and vitals reviewed.       Assessment & Plan:   1. Right sided sciatica    -persistent. -refer to ortho. -rx for Gabapentin 100mg  qid and Tramadol provided. -continue home exercise program but increase to daily.   Orders Placed This Encounter  Procedures  . Ambulatory referral to Orthopedic Surgery    Referral Priority:   Routine    Referral Type:   Surgical    Referral Reason:   Specialty Services Required    Requested  Specialty:   Orthopedic Surgery    Number of Visits Requested:   1   Meds ordered this encounter  Medications  . gabapentin (NEURONTIN) 100 MG capsule    Sig: Take 1 capsule (100 mg total) by mouth 4 (four) times daily.    Dispense:  90 capsule    Refill:  0  . traMADol (ULTRAM) 50 MG tablet    Sig: Take 1 tablet (50  mg total) by mouth every 6 (six) hours as needed.    Dispense:  45 tablet    Refill:  0    No Follow-up on file.   I personally performed the services described in this documentation, which was scribed in my presence. The recorded information has been reviewed and considered.  Kristi Elayne Guerin, M.D. [facility] [address] [place], [place]  [address] [phone] phone [phone] fax

## 2015-10-15 NOTE — Patient Instructions (Signed)
     IF you received an x-ray today, you will receive an invoice from [facility]. Please contact [facility] at [phone] with questions or concerns regarding your invoice.   IF you received labwork today, you will receive an invoice from [facility]. Please contact [facility] at [phone] with questions or concerns regarding your invoice.   Our billing staff will not be able to assist you with questions regarding bills from these companies.  You will be contacted with the lab results as soon as they are available. The fastest way to get your results is to activate your My Chart account. Instructions are located on the last page of this paperwork. If you have not heard from us regarding the results in 2 weeks, please contact this office.      

## 2015-10-17 ENCOUNTER — Telehealth: Payer: Self-pay

## 2015-10-17 DIAGNOSIS — M5431 Sciatica, right side: Secondary | ICD-10-CM

## 2015-10-17 NOTE — Telephone Encounter (Signed)
Pt is wanting to go ahead with referral to [facility] in [place] number [phone]

## 2015-10-20 ENCOUNTER — Telehealth: Payer: Self-pay

## 2015-10-20 DIAGNOSIS — M5431 Sciatica, right side: Secondary | ICD-10-CM

## 2015-10-20 NOTE — Telephone Encounter (Signed)
Did you guys talk about this at the last visit?

## 2015-10-20 NOTE — Telephone Encounter (Signed)
The patient was given an orthopedic referral for a consult for a nerve block with Dr Marry Guan at [facility].  She does not want this referral at this time.  Instead she wants to know if she can get a cortisone shot from Dr Tamala Julian, or a referral to someone who does them.  The patient would also like to get an MRI order placed because she wants to know the root of the issue before going to orthopedics or PT.  Please advise.  CB#: [phone]

## 2015-10-21 ENCOUNTER — Encounter: Payer: Self-pay | Admitting: Family Medicine

## 2015-10-21 NOTE — Telephone Encounter (Signed)
Yes.  I gave her an option for referral to physical therapy and/or orthopedics.  I have placed order for physical therapy.  Referrals, please refer to [facility] in [place].

## 2015-10-23 ENCOUNTER — Encounter: Payer: Self-pay | Admitting: Family Medicine

## 2015-10-24 NOTE — Telephone Encounter (Signed)
Please call patient --- 1.  She has an upcoming appointment with physical therapy.  2. I have also referred her to Dr. Mina Marble of [facility] who can order MRI of lumbar spine and also performs injections if indicated.

## 2015-10-24 NOTE — Telephone Encounter (Signed)
Spoke with pt, advised her of Dr. Thompson Caul message. Pt understood. She states she will try the PT first and if that doesn't work then she will get the injection.

## 2015-10-24 NOTE — Addendum Note (Signed)
Addended by: Wardell Honour on: 10/24/2015 12:48 AM   Modules accepted: Orders

## 2015-10-29 DIAGNOSIS — M545 Low back pain: Secondary | ICD-10-CM | POA: Diagnosis not present

## 2015-10-31 ENCOUNTER — Telehealth: Payer: Self-pay | Admitting: Family Medicine

## 2015-10-31 DIAGNOSIS — M545 Low back pain: Secondary | ICD-10-CM | POA: Diagnosis not present

## 2015-10-31 DIAGNOSIS — M5431 Sciatica, right side: Secondary | ICD-10-CM

## 2015-10-31 DIAGNOSIS — M5432 Sciatica, left side: Secondary | ICD-10-CM

## 2015-10-31 NOTE — Telephone Encounter (Signed)
Pt calling to let Dr Tamala Julian know that the PT didn't help with her pain and if you could please schedule her an MRI please call patient

## 2015-11-03 ENCOUNTER — Other Ambulatory Visit: Payer: Self-pay | Admitting: Family Medicine

## 2015-11-03 ENCOUNTER — Telehealth: Payer: Self-pay | Admitting: Family Medicine

## 2015-11-03 NOTE — Telephone Encounter (Signed)
Pt calling to see if [facility] had faxed any information on her about not doing well with the pain and not doing the injection

## 2015-11-05 NOTE — Telephone Encounter (Signed)
Rx for Gabapentin approved.

## 2015-11-05 NOTE — Telephone Encounter (Signed)
Do you want to refill? 

## 2015-11-12 ENCOUNTER — Ambulatory Visit (HOSPITAL_COMMUNITY)
Admission: RE | Admit: 2015-11-12 | Discharge: 2015-11-12 | Disposition: A | Discharge status: Home / Self Care | Payer: Medicare Other | Source: Ambulatory Visit | Attending: Family Medicine | Admitting: Family Medicine

## 2015-11-12 DIAGNOSIS — M5126 Other intervertebral disc displacement, lumbar region: Secondary | ICD-10-CM | POA: Diagnosis not present

## 2015-11-12 DIAGNOSIS — M5136 Other intervertebral disc degeneration, lumbar region: Secondary | ICD-10-CM | POA: Insufficient documentation

## 2015-11-12 DIAGNOSIS — M5431 Sciatica, right side: Secondary | ICD-10-CM | POA: Diagnosis not present

## 2015-11-12 DIAGNOSIS — M4806 Spinal stenosis, lumbar region: Secondary | ICD-10-CM | POA: Diagnosis not present

## 2015-11-12 DIAGNOSIS — M5127 Other intervertebral disc displacement, lumbosacral region: Secondary | ICD-10-CM | POA: Insufficient documentation

## 2015-11-13 ENCOUNTER — Telehealth: Payer: Self-pay

## 2015-11-13 ENCOUNTER — Ambulatory Visit (HOSPITAL_COMMUNITY): Payer: Medicare Other

## 2015-11-13 ENCOUNTER — Telehealth: Payer: Self-pay | Admitting: *Deleted

## 2015-11-13 NOTE — Telephone Encounter (Signed)
Patient would like for [place] to release MRI results to my chart, but they have not been reviewed.  Can you review and release?

## 2015-11-13 NOTE — Telephone Encounter (Signed)
Patient received email from [facility] stating that her results are ready from a test on 9/6. Was unable to view results & is requesting a call to deliver the results when possible. Please advise.  [phone]

## 2015-11-13 NOTE — Telephone Encounter (Signed)
Looks like patient has reviewed.

## 2015-11-17 ENCOUNTER — Other Ambulatory Visit: Payer: Self-pay | Admitting: Family Medicine

## 2015-11-17 DIAGNOSIS — M5126 Other intervertebral disc displacement, lumbar region: Secondary | ICD-10-CM

## 2015-11-17 DIAGNOSIS — M5431 Sciatica, right side: Secondary | ICD-10-CM

## 2015-11-19 NOTE — Telephone Encounter (Signed)
MRI results released and communication via MyChart with patient. Referral to [facility] placed due to worsening MRI lumbar spine findings.

## 2015-11-26 ENCOUNTER — Encounter: Payer: Self-pay | Admitting: Family Medicine

## 2015-11-28 ENCOUNTER — Encounter: Payer: Self-pay | Admitting: Family Medicine

## 2015-11-28 DIAGNOSIS — M5126 Other intervertebral disc displacement, lumbar region: Secondary | ICD-10-CM | POA: Diagnosis not present

## 2015-11-28 DIAGNOSIS — I10 Essential (primary) hypertension: Secondary | ICD-10-CM | POA: Diagnosis not present

## 2015-12-01 ENCOUNTER — Other Ambulatory Visit: Payer: Self-pay | Admitting: Family Medicine

## 2015-12-01 MED ORDER — MELOXICAM 7.5 MG PO TABS: 7.5000 mg | ORAL_TABLET | Freq: Every day | ORAL | 1 refills | Status: DC | PRN

## 2015-12-05 DIAGNOSIS — M5416 Radiculopathy, lumbar region: Secondary | ICD-10-CM | POA: Diagnosis not present

## 2015-12-05 DIAGNOSIS — M4726 Other spondylosis with radiculopathy, lumbar region: Secondary | ICD-10-CM | POA: Diagnosis not present

## 2015-12-05 DIAGNOSIS — M5136 Other intervertebral disc degeneration, lumbar region: Secondary | ICD-10-CM | POA: Diagnosis not present

## 2015-12-05 DIAGNOSIS — M4806 Spinal stenosis, lumbar region: Secondary | ICD-10-CM | POA: Diagnosis not present

## 2015-12-22 DIAGNOSIS — R030 Elevated blood-pressure reading, without diagnosis of hypertension: Secondary | ICD-10-CM | POA: Diagnosis not present

## 2015-12-22 DIAGNOSIS — M5126 Other intervertebral disc displacement, lumbar region: Secondary | ICD-10-CM | POA: Diagnosis not present

## 2015-12-26 DIAGNOSIS — M5116 Intervertebral disc disorders with radiculopathy, lumbar region: Secondary | ICD-10-CM | POA: Diagnosis not present

## 2015-12-26 DIAGNOSIS — M4726 Other spondylosis with radiculopathy, lumbar region: Secondary | ICD-10-CM | POA: Diagnosis not present

## 2016-01-17 DIAGNOSIS — Z23 Encounter for immunization: Secondary | ICD-10-CM | POA: Diagnosis not present

## 2016-03-20 ENCOUNTER — Other Ambulatory Visit: Payer: Self-pay | Admitting: Family Medicine

## 2016-04-07 ENCOUNTER — Ambulatory Visit (INDEPENDENT_AMBULATORY_CARE_PROVIDER_SITE_OTHER): Payer: Medicare Other | Admitting: Family Medicine

## 2016-04-07 ENCOUNTER — Encounter: Payer: Self-pay | Admitting: Family Medicine

## 2016-04-07 ENCOUNTER — Ambulatory Visit (INDEPENDENT_AMBULATORY_CARE_PROVIDER_SITE_OTHER): Payer: Medicare Other

## 2016-04-07 VITALS — BP 126/78 | HR 84 | Temp 98.2°F | Resp 16 | Ht 62.0 in | Wt 131.0 lb

## 2016-04-07 DIAGNOSIS — A09 Infectious gastroenteritis and colitis, unspecified: Secondary | ICD-10-CM | POA: Diagnosis not present

## 2016-04-07 DIAGNOSIS — Z131 Encounter for screening for diabetes mellitus: Secondary | ICD-10-CM | POA: Diagnosis not present

## 2016-04-07 DIAGNOSIS — F5104 Psychophysiologic insomnia: Secondary | ICD-10-CM | POA: Diagnosis not present

## 2016-04-07 DIAGNOSIS — R102 Pelvic and perineal pain: Secondary | ICD-10-CM

## 2016-04-07 DIAGNOSIS — M797 Fibromyalgia: Secondary | ICD-10-CM | POA: Diagnosis not present

## 2016-04-07 DIAGNOSIS — E7800 Pure hypercholesterolemia, unspecified: Secondary | ICD-10-CM | POA: Diagnosis not present

## 2016-04-07 DIAGNOSIS — F419 Anxiety disorder, unspecified: Secondary | ICD-10-CM

## 2016-04-07 DIAGNOSIS — Z800 Family history of malignant neoplasm of digestive organs: Secondary | ICD-10-CM

## 2016-04-07 DIAGNOSIS — M25552 Pain in left hip: Secondary | ICD-10-CM

## 2016-04-07 DIAGNOSIS — F329 Major depressive disorder, single episode, unspecified: Secondary | ICD-10-CM

## 2016-04-07 DIAGNOSIS — F418 Other specified anxiety disorders: Secondary | ICD-10-CM

## 2016-04-07 DIAGNOSIS — Z8041 Family history of malignant neoplasm of ovary: Secondary | ICD-10-CM

## 2016-04-07 DIAGNOSIS — Z0000 Encounter for general adult medical examination without abnormal findings: Secondary | ICD-10-CM

## 2016-04-07 DIAGNOSIS — R1032 Left lower quadrant pain: Secondary | ICD-10-CM

## 2016-04-07 DIAGNOSIS — F32A Depression, unspecified: Secondary | ICD-10-CM

## 2016-04-07 DIAGNOSIS — Z23 Encounter for immunization: Secondary | ICD-10-CM | POA: Diagnosis not present

## 2016-04-07 DIAGNOSIS — R197 Diarrhea, unspecified: Secondary | ICD-10-CM

## 2016-04-07 DIAGNOSIS — Z803 Family history of malignant neoplasm of breast: Secondary | ICD-10-CM | POA: Diagnosis not present

## 2016-04-07 LAB — POCT URINALYSIS DIP (MANUAL ENTRY)
BILIRUBIN UA: NEGATIVE
BILIRUBIN UA: NEGATIVE
Glucose, UA: NEGATIVE
Leukocytes, UA: NEGATIVE
NITRITE UA: NEGATIVE
PH UA: 7.5
Protein Ur, POC: NEGATIVE
RBC UA: NEGATIVE
SPEC GRAV UA: 1.015
Urobilinogen, UA: 0.2

## 2016-04-07 MED ORDER — MELOXICAM 7.5 MG PO TABS: 7.5000 mg | ORAL_TABLET | Freq: Every day | ORAL | 1 refills | Status: DC | PRN

## 2016-04-07 MED ORDER — TRAZODONE HCL 50 MG PO TABS: 50.0000 mg | ORAL_TABLET | Freq: Every evening | ORAL | 3 refills | Status: DC | PRN

## 2016-04-07 MED ORDER — BUPROPION HCL ER (XL) 150 MG PO TB24: 150.0000 mg | ORAL_TABLET | Freq: Every day | ORAL | 3 refills | Status: DC

## 2016-04-07 NOTE — Patient Instructions (Signed)
PLEASE BRING DR. Steffanie Dunn Avangeline Stockburger A COPY OF YOUR LIVING WILL.    IF you received an x-ray today, you will receive an invoice from [facility]. Please contact [facility] at [phone] with questions or concerns regarding your invoice.   IF you received labwork today, you will receive an invoice from [facility]. Please contact [facility] at [phone] with questions or concerns regarding your invoice.   Our billing staff will not be able to assist you with questions regarding bills from these companies.  You will be contacted with the lab results as soon as they are available. The fastest way to get your results is to activate your My Chart account. Instructions are located on the last page of this paperwork. If you have not heard from [place] regarding the results in 2 weeks, please contact this office.     Keeping You Healthy  Get These Tests  Blood Pressure- Have your blood pressure checked by your healthcare provider at least once a year.  Normal blood pressure is 120/80.  Weight- Have your body mass index (BMI) calculated to screen for obesity.  BMI is a measure of body fat based on height and weight.  You can calculate your own BMI at [employer].it  Cholesterol- Have your cholesterol checked every year.  Diabetes- Have your blood sugar checked every year if you have high blood pressure, high cholesterol, a family history of diabetes or if you are overweight.  Pap Test - Have a pap test every 1 to 5 years if you have been sexually active.  If you are older than 65 and recent pap tests have been normal you may not need additional pap tests.  In addition, if you have had a hysterectomy  for benign disease additional pap tests are not necessary.  Mammogram-Yearly mammograms are essential for early detection of breast cancer  Screening for Colon Cancer- Colonoscopy starting at age 62. Screening may begin sooner depending on your family history and other health  conditions.  Follow up colonoscopy as directed by your Gastroenterologist.  Screening for Osteoporosis- Screening begins at age 100 with bone density scanning, sooner if you are at higher risk for developing Osteoporosis.  Get these medicines  Calcium with Vitamin D- Your body requires 1200-1500 mg of Calcium a day and [phone] IU of Vitamin D a day.  You can only absorb 500 mg of Calcium at a time therefore Calcium must be taken in 2 or 3 separate doses throughout the day.  Hormones- Hormone therapy has been associated with increased risk for certain cancers and heart disease.  Talk to your healthcare provider about if you need relief from menopausal symptoms.  Aspirin- Ask your healthcare provider about taking Aspirin to prevent Heart Disease and Stroke.  Get these Immuniztions  Flu shot- Every fall  Pneumonia shot- Once after the age of 16; if you are younger ask your healthcare provider if you need a pneumonia shot.  Tetanus- Every ten years.  Zostavax- Once after the age of 31 to prevent shingles.  Take these steps  Don't smoke- Your healthcare provider can help you quit. For tips on how to quit, ask your healthcare provider or go to www.smokefree.gov or call 1-800 QUIT-NOW.  Be physically active- Exercise 5 days a week for a minimum of 30 minutes.  If you are not already physically active, start slow and gradually work up to 30 minutes of moderate physical activity.  Try walking, dancing, bike riding, swimming, etc.  Eat a healthy diet- Eat a  variety of healthy foods such as fruits, vegetables, whole grains, low fat milk, low fat cheeses, yogurt, lean meats, chicken, fish, eggs, dried beans, tofu, etc.  For more information go to www.thenutritionsource.org  Dental visit- Brush and floss teeth twice daily; visit your dentist twice a year.  Eye exam- Visit your Optometrist or Ophthalmologist yearly.  Drink alcohol in moderation- Limit alcohol intake to one drink or less a day.   Never drink and drive.  Depression- Your emotional health is as important as your physical health.  If you're feeling down or losing interest in things you normally enjoy, please talk to your healthcare provider.  Seat Belts- can save your life; always wear one  Smoke/Carbon Monoxide detectors- These detectors need to be installed on the appropriate level of your home.  Replace batteries at least once a year.  Violence- If anyone is threatening or hurting you, please tell your healthcare provider.  Living Will/ Health care power of attorney- Discuss with your healthcare provider and family.

## 2016-04-07 NOTE — Progress Notes (Signed)
Subjective:    Patient ID: Tracy Estrada, female    DOB: 1950-06-13, 66 y.o.   MRN: [id number]  04/07/2016  Flank Pain (left thigh and groin and radiates to her back)   HPI This 66 y.o. female presents for Annual Wellness Examination and evaluation of L flank pain radiating to L thigh and groin and back.  [place] three days after being released by surgeon; released January 14, 2016.  [place] on January 18, 2016; rollled ankle; landed on L knee. Now having L groin pain and lateral hip.  Ankle hurt in November.  Gradual pain in L groin region for past month. [place] last weekend as well; careless; went over dogs gate; had boots on and buckle got caught on gait.Pain does radiate into back a little bit.  Has appointment on 04-15-16.  L groin pain; L hip region/swelling; worried about hernia. Pain with walking; some days pain is less. No radiation into leg.  Pain isolated to groin but can radiate to lower back.  Also hurts top of leg as well.  No limping.  No nightime awakening with Trazodone 100mg  qhs; sleeps well; goes to bed at 8:30pm.  Wakes up at 5:00am.  No n/v/d; worried about IBS; will have diarrhea; no constipation; has been having diarrhea since L groin pain developed. S/p colonoscopy in 03/2015.    S/p lumbar surgery; R sided pain improved.  Last physical:  11-06-2014 Pap smear:  hysterectomy Mammogram: 2016 Colonoscopy: 2017 Bone density:  2016 Eye exam:  overdue Dental exam:  overdue  Immunization History  Administered Date(s) Administered  . Influenza,inj,Quad PF,36+ Mos 11/06/2014  . Influenza-Unspecified 01/07/2016  . Pneumococcal Conjugate-13 11/06/2014  . Pneumococcal Polysaccharide-23 04/07/2016  . Zoster 09/06/2011   BP Readings from Last 3 Encounters:  04/07/16 126/78  10/15/15 126/82  09/25/15 130/62   Wt Readings from Last 3 Encounters:  04/07/16 131 lb (59.4 kg)  10/15/15 131 lb (59.4 kg)  09/25/15 126 lb (57.2 kg)    Review of Systems  Constitutional: Negative for  activity change, appetite change, chills, diaphoresis, fatigue, fever and unexpected weight change.  HENT: Negative for congestion, dental problem, drooling, ear discharge, ear pain, facial swelling, hearing loss, mouth sores, nosebleeds, postnasal drip, rhinorrhea, sinus pressure, sneezing, sore throat, tinnitus, trouble swallowing and voice change.   Eyes: Negative for photophobia, pain, discharge, redness, itching and visual disturbance.  Respiratory: Negative for apnea, cough, choking, chest tightness, shortness of breath, wheezing and stridor.   Cardiovascular: Negative for chest pain, palpitations and leg swelling.  Gastrointestinal: Positive for diarrhea. Negative for abdominal distention, abdominal pain, anal bleeding, blood in stool, constipation, nausea, rectal pain and vomiting.  Endocrine: Negative for cold intolerance, heat intolerance, polydipsia, polyphagia and polyuria.  Genitourinary: Negative for decreased urine volume, difficulty urinating, dyspareunia, dysuria, enuresis, flank pain, frequency, genital sores, hematuria, menstrual problem, pelvic pain, urgency, vaginal bleeding, vaginal discharge and vaginal pain.  Musculoskeletal: Positive for back pain and myalgias. Negative for arthralgias, gait problem, joint swelling, neck pain and neck stiffness.  Skin: Negative for color change, pallor, rash and wound.  Allergic/Immunologic: Negative for environmental allergies, food allergies and immunocompromised state.  Neurological: Negative for dizziness, tremors, seizures, syncope, facial asymmetry, speech difficulty, weakness, light-headedness, numbness and headaches.  Hematological: Negative for adenopathy. Does not bruise/bleed easily.  Psychiatric/Behavioral: Negative for agitation, behavioral problems, confusion, decreased concentration, dysphoric mood, hallucinations, self-injury, sleep disturbance and suicidal ideas. The patient is not nervous/anxious and is not hyperactive.      Past Medical History:  Diagnosis Date  . Achilles bursitis or tendinitis   . Anxiety state, unspecified   . Arthritis   . Closed fracture of unspecified part of fibula   . Depressive disorder, not elsewhere classified   . Diverticulosis of colon (without mention of hemorrhage)   . Edema   . Fibromyalgia   . Insomnia, unspecified   . Lumbago   . Meniere's disease 1986  . MVP (mitral valve prolapse)   . Pain in joint, site unspecified   . Peptic ulcer    no testing  . Pure hypercholesterolemia   . TSH elevation    Past Surgical History:  Procedure Laterality Date  . ABDOMINAL HYSTERECTOMY  03/08/1980   fibroids; ovaries intact.  Marland Kitchen SPINE SURGERY     lumbar spine 11/2015; cervical spine 2009. [facility].   Allergies  Allergen Reactions  . Demerol [Meperidine] Nausea And Vomiting    Social History   Social History  . Marital status: Married    Spouse name: Neila Holts  . Number of children: 2  . Years of education: N/A   Occupational History  . retired     Network engineer   Social History Main Topics  . Smoking status: Never Smoker  . Smokeless tobacco: Never Used  . Alcohol use 0.0 oz/week     Comment: 3-4 beers per week or wine  . Drug use: No  . Sexual activity: Yes    Birth control/ protection: Surgical   Other Topics Concern  . Not on file   Social History Narrative   Marital status:  Married x 14 years; second marriage;happily married,husband has PTSD.     Children: 2 daughters [name], [name]); 7 grandchildren; 2 gg      Lives: with husband, dog/beagle      Employment: retired since 2013; Network engineer      Tobacco: never      Alcohol: weekends; 1-2 glasses of wine per week or beer.      Drugs:  None      Exercise: none in 2018      Seatbelt: 100%      Guns: unloaded.     Caffeine not every day. 7 grandchildren and 2 GG. Organ donor NO.      ADLs: independent with ADLs; drives      Advanced Directives: YES; FULL CODE no prolonged measures.      Family  History  Problem Relation Age of Onset  . Hyperlipidemia Mother   . Hypertension Mother   . Colon polyps Mother   . Breast cancer Sister 56    with metastasis to bone; smoker  . Cancer Sister 28    breast cancer  . Coronary artery disease Father      AMI age 41  . Heart disease Father 36    AMI x 2; first AMI in 64s  . Stroke Maternal Grandmother   . Heart disease Paternal Grandfather   . Heart attack Paternal Grandfather   . Colon cancer Maternal Uncle     dx. >50  . Colon cancer Paternal Aunt     dx. 58s  . Heart attack Paternal Uncle   . Alzheimer's disease Paternal Grandmother   . Breast cancer Cousin   . Ovarian cancer Cousin     dx. late 70s/dx. 60s  . Lung cancer Cousin     dx. 55s  . Stomach cancer Other        Objective:    BP 126/78 (BP Location: Right Arm, Patient Position: Sitting, Cuff Size:  Small)   Pulse 84   Temp 98.2 F (36.8 C) (Oral)   Resp 16   Ht 5\' 2"  (1.575 m)   Wt 131 lb (59.4 kg)   SpO2 96%   BMI 23.96 kg/m  Physical Exam  Constitutional: She is oriented to person, place, and time. She appears well-developed and well-nourished. No distress.  HENT:  Head: Normocephalic and atraumatic.  Right Ear: External ear normal.  Left Ear: External ear normal.  Nose: Nose normal.  Mouth/Throat: Oropharynx is clear and moist.  Eyes: Conjunctivae and EOM are normal. Pupils are equal, round, and reactive to light.  Neck: Normal range of motion. Neck supple. Carotid bruit is not present. No thyromegaly present.  Cardiovascular: Normal rate, regular rhythm, normal heart sounds and intact distal pulses.  Exam reveals no gallop and no friction rub.   No murmur heard. Pulmonary/Chest: Effort normal and breath sounds normal. She has no wheezes. She has no rales. Right breast exhibits no inverted nipple, no mass, no nipple discharge, no skin change and no tenderness. Left breast exhibits no inverted nipple, no mass, no nipple discharge, no skin change and no  tenderness. Breasts are symmetrical.  Abdominal: Soft. Bowel sounds are normal. She exhibits no distension and no mass. There is tenderness in the left lower quadrant. There is no rebound and no guarding. Hernia confirmed negative in the right inguinal area and confirmed negative in the left inguinal area.  Genitourinary: Vagina normal. There is no rash, tenderness, lesion or injury on the right labia. There is no rash, tenderness, lesion or injury on the left labia. Right adnexum displays no mass, no tenderness and no fullness. Left adnexum displays no mass and no tenderness.  Musculoskeletal:       Left hip: Normal. She exhibits normal range of motion, normal strength and no swelling.       Lumbar back: Normal. She exhibits normal range of motion, no tenderness, no bony tenderness, no swelling, no pain, no spasm and normal pulse.  Lymphadenopathy:    She has no cervical adenopathy.       Right: No inguinal adenopathy present.       Left: No inguinal adenopathy present.  Neurological: She is alert and oriented to person, place, and time. No cranial nerve deficit.  Skin: Skin is warm and dry. No rash noted. She is not diaphoretic. No erythema. No pallor.  Psychiatric: She has a normal mood and affect. Her behavior is normal.   Depression screen [facility] 2/9 04/07/2016 10/15/2015 09/25/2015 09/16/2015 11/06/2014  Decreased Interest 0 0 0 0 0  Down, Depressed, Hopeless 0 0 0 0 0  PHQ - 2 Score 0 0 0 0 0   Fall Risk  04/07/2016 10/15/2015 09/25/2015 09/16/2015 11/06/2014  Falls in the past year? Yes No No No Yes  Number falls in past yr: 1 - - - 1  Injury with Fall? No - - - No   Functional Status Survey: Is the patient deaf or have difficulty hearing?: No Does the patient have difficulty seeing, even when wearing glasses/contacts?: No Does the patient have difficulty concentrating, remembering, or making decisions?: No Does the patient have difficulty walking or climbing stairs?: No Does the patient have  difficulty dressing or bathing?: No Does the patient have difficulty doing errands alone such as visiting a doctor's office or shopping?: No      Assessment & Plan:   1. Encounter for Medicare annual wellness exam   2. Left hip pain   3.  Acute pain in female pelvis   4. Diarrhea of presumed infectious origin   5. LLQ pain   6. Need for prophylactic vaccination against Streptococcus pneumoniae (pneumococcus)   7. Family history of breast cancer in sister   46. Family history of ovarian cancer   9. Family history of colon cancer   10. Fibromyalgia   11. Anxiety and depression   12. Psychophysiological insomnia   13. Pure hypercholesterolemia   14. Screening for diabetes mellitus    -anticipatory guidance --- exercise, weight loss, low calorie food choices. -no hearing loss; no evidence of depression or anxiety.  Independent with ADLs. Discussed advanced directives. -moderate fall risk due to fibromyalgia and DDD lumbar. -new onset L hip pain, L pelvic pain, and diarrhea; obtain L hip xray; also obtain CT abd/pelvis. -obtain labs for chronic medical conditions as well as acute issues;  -refills of medications provided.   Orders Placed This Encounter  Procedures  . DG HIP UNILAT W OR W/O PELVIS 2-3 VIEWS LEFT    Standing Status:   Future    Number of Occurrences:   1    Standing Expiration Date:   04/07/2017    Order Specific Question:   Reason for Exam (SYMPTOM  OR DIAGNOSIS REQUIRED)    Answer:   L hip pain after falls    Order Specific Question:   Preferred imaging location?    Answer:   External  . CT Abdomen Pelvis W Contrast    Standing Status:   Future    Standing Expiration Date:   07/05/2017    Order Specific Question:   If indicated for the ordered procedure, I authorize the administration of contrast media per Radiology protocol    Answer:   Yes    Order Specific Question:   Reason for Exam (SYMPTOM  OR DIAGNOSIS REQUIRED)    Answer:   L pelvis/groin pain with  diarrhea    Order Specific Question:   Preferred imaging location?    Answer:   [facility]  . Pneumococcal polysaccharide vaccine 23-valent greater than or equal to 2yo subcutaneous/IM  . CBC with Differential/Platelet  . Comprehensive metabolic panel    Order Specific Question:   Has the patient fasted?    Answer:   Yes  . Lipid panel    Order Specific Question:   Has the patient fasted?    Answer:   Yes  . POCT urinalysis dipstick   Meds ordered this encounter  Medications  . co-enzyme Q-10 30 MG capsule    Sig: Take 60 mg by mouth 3 (three) times daily.  Javier Docker Oil 500 MG CAPS    Sig: Take 500 mg by mouth daily.  Marland Kitchen buPROPion (WELLBUTRIN XL) 150 MG 24 hr tablet    Sig: Take 1 tablet (150 mg total) by mouth daily.    Dispense:  90 tablet    Refill:  3  . traZODone (DESYREL) 50 MG tablet    Sig: Take 1-2 tablets (50-100 mg total) by mouth at bedtime as needed for sleep.    Dispense:  180 tablet    Refill:  3  . meloxicam (MOBIC) 7.5 MG tablet    Sig: Take 1 tablet (7.5 mg total) by mouth daily as needed for pain.    Dispense:  90 tablet    Refill:  1    No Follow-up on file.   Kristi Elayne Guerin, M.D. Primary Care at [facility] [place] [address] , [place]  [address] [phone] phone [phone] fax  

## 2016-04-08 LAB — LIPID PANEL
CHOL/HDL RATIO: 3.7 ratio (ref 0.0–4.4)
Cholesterol, Total: 190 mg/dL (ref 100–199)
HDL: 52 mg/dL (ref >39)
LDL Calculated: 119 mg/dL — ABNORMAL HIGH (ref 0–99)
Triglycerides: 93 mg/dL (ref 0–149)
VLDL Cholesterol Cal: 19 mg/dL (ref 5–40)

## 2016-04-08 LAB — CBC WITH DIFFERENTIAL/PLATELET
BASOS: 0 %
Basophils Absolute: 0 10*3/uL (ref 0.0–0.2)
EOS (ABSOLUTE): 0 10*3/uL (ref 0.0–0.4)
EOS: 1 %
HEMATOCRIT: 39.3 % (ref 34.0–46.6)
HEMOGLOBIN: 12.9 g/dL (ref 11.1–15.9)
IMMATURE GRANS (ABS): 0 10*3/uL (ref 0.0–0.1)
IMMATURE GRANULOCYTES: 0 %
LYMPHS: 38 %
Lymphocytes Absolute: 1.5 10*3/uL (ref 0.7–3.1)
MCH: 32 pg (ref 26.6–33.0)
MCHC: 32.8 g/dL (ref 31.5–35.7)
MCV: 98 fL — AB (ref 79–97)
Monocytes Absolute: 0.3 10*3/uL (ref 0.1–0.9)
Monocytes: 7 %
NEUTROS ABS: 2 10*3/uL (ref 1.4–7.0)
Neutrophils: 54 %
Platelets: 214 10*3/uL (ref 150–379)
RBC: 4.03 x10E6/uL (ref 3.77–5.28)
RDW: 12.4 % (ref 12.3–15.4)
WBC: 3.8 10*3/uL (ref 3.4–10.8)

## 2016-04-08 LAB — COMPREHENSIVE METABOLIC PANEL
A/G RATIO: 2 (ref 1.2–2.2)
ALBUMIN: 4.5 g/dL (ref 3.6–4.8)
ALT: 19 IU/L (ref 0–32)
AST: 16 IU/L (ref 0–40)
Alkaline Phosphatase: 54 IU/L (ref 39–117)
BILIRUBIN TOTAL: 0.4 mg/dL (ref 0.0–1.2)
BUN / CREAT RATIO: 27 (ref 12–28)
BUN: 16 mg/dL (ref 8–27)
CALCIUM: 9.7 mg/dL (ref 8.7–10.3)
CHLORIDE: 101 mmol/L (ref 96–106)
CO2: 29 mmol/L (ref 18–29)
Creatinine, Ser: 0.6 mg/dL (ref 0.57–1.00)
GFR, EST AFRICAN AMERICAN: 109 mL/min/{1.73_m2} (ref >59)
GFR, EST NON AFRICAN AMERICAN: 95 mL/min/{1.73_m2} (ref >59)
GLOBULIN, TOTAL: 2.2 g/dL (ref 1.5–4.5)
Glucose: 80 mg/dL (ref 65–99)
POTASSIUM: 4.3 mmol/L (ref 3.5–5.2)
SODIUM: 141 mmol/L (ref 134–144)
TOTAL PROTEIN: 6.7 g/dL (ref 6.0–8.5)

## 2016-04-10 ENCOUNTER — Telehealth: Payer: Self-pay

## 2016-04-10 NOTE — Telephone Encounter (Signed)
Pt called to ask about scheduling CT scan. Asked for [facility]. Faxed order 2/3

## 2016-04-14 ENCOUNTER — Encounter: Payer: Self-pay | Admitting: Family Medicine

## 2016-04-14 DIAGNOSIS — R102 Pelvic and perineal pain: Secondary | ICD-10-CM | POA: Diagnosis not present

## 2016-04-14 DIAGNOSIS — K5730 Diverticulosis of large intestine without perforation or abscess without bleeding: Secondary | ICD-10-CM | POA: Diagnosis not present

## 2016-04-14 DIAGNOSIS — N9489 Other specified conditions associated with female genital organs and menstrual cycle: Secondary | ICD-10-CM | POA: Diagnosis not present

## 2016-04-14 DIAGNOSIS — K5750 Diverticulosis of both small and large intestine without perforation or abscess without bleeding: Secondary | ICD-10-CM | POA: Diagnosis not present

## 2016-04-19 ENCOUNTER — Telehealth: Payer: Self-pay

## 2016-04-19 DIAGNOSIS — R102 Pelvic and perineal pain: Secondary | ICD-10-CM

## 2016-04-19 DIAGNOSIS — N83202 Unspecified ovarian cyst, left side: Secondary | ICD-10-CM

## 2016-04-19 NOTE — Telephone Encounter (Signed)
PT WANTS TO KNOW HER RESULTS OF HER CT SCAN  PLEASE ADVISE: [phone] OR [phone]

## 2016-04-20 NOTE — Telephone Encounter (Signed)
Report in your mailbox and patient would like a call today as she is leaving out of town tomorrow and still has pain.

## 2016-04-20 NOTE — Telephone Encounter (Signed)
[facility] is where scan was done

## 2016-04-21 NOTE — Telephone Encounter (Signed)
Pt advised  Pt does still have diarrhea and wants antibiotic to her pharmacy  Also would like u/s order so I can fax to [facility] and shell call to schedule.

## 2016-04-21 NOTE — Telephone Encounter (Signed)
Yes; I have reviewed the report.  CT scan shows: 2.7cm ovarian cyst.  Also minimal inflammation on L colon to suggest a possible mild diverticulitis.  Is she still having diarrhea?   Pelvic ultrasound recommended to better visualize the ovarian cyst.  If still having diarrhea, recommend starting antibiotic to treat an underlying diverticulitis.

## 2016-04-21 NOTE — Telephone Encounter (Signed)
Fy: Impression:  Minimal pericolonic stranding surrounding the sigmoid colon diverticulosis, question diverticulitis, no evidence of perferation. 2.7cm left cystic adnexal lesion,likely ovarian, with adjacent 66mm density which may represent a calcification or contrast filled colonic diverticulum. Recommend pelvic u/s for further characterization of the cystic lesion in this post menopausal patient

## 2016-04-21 NOTE — Telephone Encounter (Signed)
[facility]

## 2016-04-22 ENCOUNTER — Telehealth: Payer: Self-pay | Admitting: Emergency Medicine

## 2016-04-22 MED ORDER — CIPROFLOXACIN HCL 500 MG PO TABS: 500.0000 mg | ORAL_TABLET | Freq: Two times a day (BID) | ORAL | 0 refills | Status: DC

## 2016-04-22 NOTE — Telephone Encounter (Signed)
Cipro sent in to pharmacy.  Pelvic US ordered.

## 2016-04-22 NOTE — Telephone Encounter (Signed)
Verbal order given per Dr. Tamala Julian to call in Cipro 500 mg tab to take BID x 10 days  [facility] pt preference.

## 2016-04-24 NOTE — Telephone Encounter (Signed)
Faxed to [phone] [facility]

## 2016-04-26 ENCOUNTER — Other Ambulatory Visit: Payer: Self-pay | Admitting: Emergency Medicine

## 2016-04-26 DIAGNOSIS — N83202 Unspecified ovarian cyst, left side: Secondary | ICD-10-CM

## 2016-05-03 ENCOUNTER — Ambulatory Visit
Admission: RE | Admit: 2016-05-03 | Discharge: 2016-05-03 | Disposition: A | Discharge status: Home / Self Care | Payer: Medicare Other | Source: Ambulatory Visit | Attending: Family Medicine | Admitting: Family Medicine

## 2016-05-03 DIAGNOSIS — Z9889 Other specified postprocedural states: Secondary | ICD-10-CM | POA: Diagnosis not present

## 2016-05-03 DIAGNOSIS — Z8041 Family history of malignant neoplasm of ovary: Secondary | ICD-10-CM | POA: Insufficient documentation

## 2016-05-03 DIAGNOSIS — R102 Pelvic and perineal pain: Secondary | ICD-10-CM

## 2016-05-03 DIAGNOSIS — N83202 Unspecified ovarian cyst, left side: Secondary | ICD-10-CM

## 2016-05-05 NOTE — Telephone Encounter (Signed)
Patient is looking for results of ultrasound done on 05/03/16.  She states she is still feeling bad her stomach is swollen and she is in pain.

## 2016-05-07 ENCOUNTER — Encounter: Payer: Self-pay | Admitting: Family Medicine

## 2016-05-07 NOTE — Telephone Encounter (Signed)
MyChart message sent to patient with pelvic ultrasound results.  Please call pt to review results and confirm that she has checked her mychart message.

## 2016-05-07 NOTE — Telephone Encounter (Signed)
LMVM that Dr. Tamala Julian left her results in a my chart message.  She can access my chart to get the results.  If she has any further questions, please call us back

## 2016-05-09 ENCOUNTER — Encounter: Payer: Self-pay | Admitting: Family Medicine

## 2016-05-10 ENCOUNTER — Other Ambulatory Visit: Payer: Self-pay | Admitting: Family Medicine

## 2016-05-10 DIAGNOSIS — N83202 Unspecified ovarian cyst, left side: Secondary | ICD-10-CM

## 2016-05-10 DIAGNOSIS — Z8041 Family history of malignant neoplasm of ovary: Secondary | ICD-10-CM

## 2016-05-14 ENCOUNTER — Telehealth: Payer: Self-pay | Admitting: Emergency Medicine

## 2016-05-14 NOTE — Telephone Encounter (Signed)
-----   Message from Wardell Honour, MD sent at 05/07/2016 10:39 AM EST ----- Pelvic ultrasound shows 2.6cm complex cyst on LEFT ovary.  MRI recommended to further evaluate.  I also recommend consultation by a gynecologist considering your family history of ovarian cancer.   Who would you like to see?

## 2016-05-18 ENCOUNTER — Ambulatory Visit
Admission: RE | Admit: 2016-05-18 | Discharge: 2016-05-18 | Disposition: A | Discharge status: Home / Self Care | Payer: Medicare Other | Source: Ambulatory Visit | Attending: Family Medicine | Admitting: Family Medicine

## 2016-05-18 DIAGNOSIS — K5730 Diverticulosis of large intestine without perforation or abscess without bleeding: Secondary | ICD-10-CM | POA: Insufficient documentation

## 2016-05-18 DIAGNOSIS — N83292 Other ovarian cyst, left side: Secondary | ICD-10-CM | POA: Insufficient documentation

## 2016-05-18 DIAGNOSIS — R102 Pelvic and perineal pain: Secondary | ICD-10-CM | POA: Diagnosis not present

## 2016-05-18 DIAGNOSIS — N83202 Unspecified ovarian cyst, left side: Secondary | ICD-10-CM

## 2016-05-18 DIAGNOSIS — Z8041 Family history of malignant neoplasm of ovary: Secondary | ICD-10-CM

## 2016-05-18 MED ORDER — GADOBENATE DIMEGLUMINE 529 MG/ML IV SOLN
12.0000 mL | Freq: Once | INTRAVENOUS | Status: AC | PRN
Start: 2016-05-18 — End: 2016-05-18
  Administered 2016-05-18: 12 mL via INTRAVENOUS

## 2016-05-19 ENCOUNTER — Encounter: Payer: Self-pay | Admitting: Family Medicine

## 2016-05-26 ENCOUNTER — Encounter: Payer: Self-pay | Admitting: Obstetrics and Gynecology

## 2016-05-26 ENCOUNTER — Ambulatory Visit (INDEPENDENT_AMBULATORY_CARE_PROVIDER_SITE_OTHER): Payer: Medicare Other | Admitting: Obstetrics and Gynecology

## 2016-05-26 VITALS — BP 124/70 | Ht 61.0 in | Wt 130.0 lb

## 2016-05-26 DIAGNOSIS — R1909 Other intra-abdominal and pelvic swelling, mass and lump: Secondary | ICD-10-CM

## 2016-05-26 DIAGNOSIS — N839 Noninflammatory disorder of ovary, fallopian tube and broad ligament, unspecified: Secondary | ICD-10-CM

## 2016-05-26 DIAGNOSIS — N838 Other noninflammatory disorders of ovary, fallopian tube and broad ligament: Secondary | ICD-10-CM

## 2016-05-26 NOTE — Progress Notes (Signed)
Obstetrics & Gynecology Office Visit   Chief Complaint  Patient presents with  . Pelvic Pain  . Ovarian Cyst    History of Present Illness: The patient is a 66 y.o. female presenting for consultation at the request of Dr. [name] at [facility], concerning a incidental left adnexal cyst.  Initial presentation was prompted by pelvic pain.  Previous CT initially, followed by an MRI and pelvic ultrasound imaging demonstrated dimensions of 2.4cm x 2.3cm x 2.3cm.  Appearance was notable for complex appearance with a few thin internal septations, with a 36mm hypodense nodule that on ultrasound did not appear to have internal vascular flow. The patient endorses associated symptoms of pelvic pain.  The patient denies associated symptoms of  early satiety, weight gain, weight loss, bloating, constipation.  There is a notable family history of ovarian cancer, uterine cancer, breast cancer, or colon cancer.  However, she has undergone genetic testing for a panel of gene mutation in [place] (associated with ovarian and breast cancers) that was negative.    Review of Systems:  Review of Systems  Constitutional: Negative.  Negative for weight loss.  HENT: Negative.   Eyes: Negative.   Respiratory: Negative.   Cardiovascular: Negative.   Gastrointestinal: Positive for abdominal pain. Negative for blood in stool, constipation, melena, nausea and vomiting.  Genitourinary: Negative.  Negative for dysuria.  Musculoskeletal: Negative.   Skin: Negative.   Neurological: Negative.  Negative for weakness.  Psychiatric/Behavioral: Negative.    Past Medical History:  Diagnosis Date  . Achilles bursitis or tendinitis   . Anxiety state, unspecified   . Arthritis   . Closed fracture of unspecified part of fibula   . Depressive disorder, not elsewhere classified   . Diverticulosis of colon (without mention of hemorrhage)   . Edema   . Fibromyalgia   . Insomnia, unspecified   . Lumbago    . Meniere's disease 1986  . MVP (mitral valve prolapse)   . Pain in joint, site unspecified   . Peptic ulcer    no testing  . Pure hypercholesterolemia   . TSH elevation    Past Surgical History:  Procedure Laterality Date  . ABDOMINAL HYSTERECTOMY  03/08/1980   fibroids; ovaries intact.  [facility]land [facility] SPINE SURGERY     lumbar spine 11/2015; cervical spine 2009. [facility].    Gynecologic History: No LMP recorded. Patient has had a hysterectomy.  Obstetric History: No obstetric history on file.  Family History  Problem Relation Age of Onset  . Hyperlipidemia Mother   . Hypertension Mother   . Colon polyps Mother   . Breast cancer Sister 36    with metastasis to bone; smoker  . Cancer Sister 43    breast cancer  . Coronary artery disease Father      AMI age 42  . Heart disease Father 47    AMI x 2; first AMI in 72s  . Stroke Maternal Grandmother   . Heart disease Paternal Grandfather   . Heart attack Paternal Grandfather   . Colon cancer Maternal Uncle     dx. >50  . Colon cancer Paternal Aunt     dx. 75s  . Heart attack Paternal Uncle   . Alzheimer's disease Paternal Grandmother   . Breast cancer Cousin   . Ovarian cancer Cousin     dx. late 70s/dx. 60s  . Lung cancer Cousin     dx. 58s  . Stomach cancer Other    Social History  Social History  . Marital status: Married    Spouse name: Madlyn Crosby  . Number of children: 2  . Years of education: N/A   Occupational History  . retired     Network engineer   Social History Main Topics  . Smoking status: Never Smoker  . Smokeless tobacco: Never Used  . Alcohol use 0.0 oz/week     Comment: 3-4 beers per week or wine  . Drug use: No  . Sexual activity: Yes    Birth control/ protection: Surgical   Other Topics Concern  . Not on file   Social History Narrative   Marital status:  Married x 14 years; second marriage;happily married,husband has PTSD.     Children: 2 daughters [name], [name]); 7 grandchildren; 2 gg      Lives:  with husband, dog/beagle      Employment: retired since 2013; Network engineer      Tobacco: never      Alcohol: weekends; 1-2 glasses of wine per week or beer.      Drugs:  None      Exercise: none in 2018      Seatbelt: 100%      Guns: unloaded.     Caffeine not every day. 7 grandchildren and 2 GG. Organ donor NO.      ADLs: independent with ADLs; drives      Advanced Directives: YES; FULL CODE no prolonged measures.      Allergies  Allergen Reactions  . Demerol [Meperidine] Nausea And Vomiting   Medications:   Medication Sig Start Date End Date Taking? Authorizing Provider  buPROPion (WELLBUTRIN XL) 150 MG 24 hr tablet Take 1 tablet (150 mg total) by mouth daily. 04/07/16  Yes Wardell Honour, MD  calcium-vitamin D (OSCAL WITH D) 500-200 MG-UNIT per tablet Take 2 tablets by mouth daily.   Yes Historical Provider, MD  cholecalciferol (VITAMIN D) 1000 UNITS tablet Take 1,000 Units by mouth 2 (two) times daily.   Yes Historical Provider, MD  co-enzyme Q-10 30 MG capsule Take 60 mg by mouth 3 (three) times daily.   Yes Historical Provider, MD  fluticasone (FLONASE) 50 MCG/ACT nasal spray Place 2 sprays into the nose daily.   Yes Historical Provider, MD  Javier Docker Oil 500 MG CAPS Take 500 mg by mouth daily.   Yes Historical Provider, MD  meloxicam (MOBIC) 7.5 MG tablet Take 1 tablet (7.5 mg total) by mouth daily as needed for pain. 04/07/16  Yes Wardell Honour, MD  Multiple Vitamins-Minerals (MULTIVITAMIN WITH MINERALS) tablet Take 1 tablet by mouth daily.   Yes Historical Provider, MD  traZODone (DESYREL) 50 MG tablet Take 1-2 tablets (50-100 mg total) by mouth at bedtime as needed for sleep. 04/07/16  Yes Wardell Honour, MD  betamethasone dipropionate (DIPROLENE) 0.05 % ointment Apply topically 2 (two) times daily. 02/16/14   Harrison Mons, PA-C   Physical Exam BP 124/70   Ht 5\' 1"  (1.549 m)   Wt 130 lb (59 kg)   BMI 24.56 kg/m   No LMP recorded. Patient has had a hysterectomy.  General:  NAD HEENT: normocephalic, anicteric Thyroid: no enlargement, no palpable nodules Pulmonary: No increased work of breathing Cardiovascular: RRR, distal pulses 2+ Abdomen: NABS, soft, non-tender, non-distended.  Umbilicus without lesions.  No hepatomegaly, splenomegaly or masses palpable. No evidence of hernia  Genitourinary:  External: Normal external female genitalia.  Normal urethral meatus, normal  Bartholin's and Skene's glands.    Vagina: Normal vaginal mucosa, no evidence of  prolapse.    Cervix: Surgically absent  Uterus: surgically absent  Adnexa: ovaries non-enlarged, no adnexal fullness appreciated, mild left adnexal tenderness  Rectal: deferred  Lymphatic: no evidence of inguinal lymphadenopathy Extremities: no edema, erythema, or tenderness Neurologic: Grossly intact Psychiatric: mood appropriate, affect full  Female chaperone present for pelvic and breast  portions of the physical exam  Assessment: 66 y.o. female with a left ovarian mass with some complex features.  Plan: 1) POSTMENOPAUSAL The incidence and implication of adnexal masses and ovarian cysts were discussed with the patient in detail.  Prior imaging if available was reviewed at today's visit.  While adnexal masses and cysts are a less common imaging finding in postmenopausal women as compared to premenopausal women, the vast majority of these lesions are still benign.  Follow up imaging to determine stability in size and appearance is reasonable in order to provide additional reassurance.  In some cases symptoms, family history, or indeterminate or concerning findings may warrant surgical evaluation and referral to a Gynecology-Oncologist., or serum tumor markers.  After long discussion, patient prefers removal, surgically.  Give likely benign appearance of lesion will remove and if returns at malignant, she understands she may require additional surgery with Gyn-Oncology for further staging.  Will schedule for as soon  as possible.   2) CA-125 ordered  3) IOTA score showing a risk of 7.5% of malignancy.   Prentice Docker, MD 05/26/2016 5:16 PM    CC: Wardell Honour, MD [address] [place], [place] [address]

## 2016-05-27 ENCOUNTER — Telehealth: Payer: Self-pay

## 2016-05-27 LAB — CA 125: CA 125: 8.6 U/mL (ref 0.0–38.1)

## 2016-05-27 NOTE — Telephone Encounter (Signed)
Pt is calling to follow up about lab result please call back.

## 2016-05-27 NOTE — Telephone Encounter (Signed)
Called patient, phone rang three times, then got dial tone.  If she calls back tomorrow, I will of course return her call.

## 2016-05-27 NOTE — Telephone Encounter (Signed)
Pt calling for results of CA125 that was drawn yesterday.

## 2016-05-27 NOTE — Telephone Encounter (Signed)
Pt aware via vm SDJ will call with results when they are back

## 2016-05-28 ENCOUNTER — Telehealth: Payer: Self-pay | Admitting: Obstetrics and Gynecology

## 2016-05-28 NOTE — Telephone Encounter (Signed)
-----   Message from Will Bonnet, MD sent at 05/26/2016  5:29 PM EDT ----- Regarding: Schedule Surgery Surgery Booking Request Patient Full Name: Tracy Estrada MRN: [id number]   DOB: 1951/01/05   Surgeon: Prentice Docker, MD  Requested Surgery Date and Time: next few weeks Primary Diagnosis and Code:  1) other intra-abdominal mass (R19.09) 2) mass of left ovary (N83.9)  Secondary Diagnosis and Code: none Surgical Procedure: 1) laparoscopic left salpingo-oophorectomy, 2) possible cystoscopy Admission Status: same day surgery Length of Surgery: 1 hour Special Case Needs: none H&P: tbd (date) Phone Interview or Office Pre-Admit: tbd Interpreter: n/a Language: n/a Medical Clearance: n/a Special Scheduling Instructions: none

## 2016-05-28 NOTE — Telephone Encounter (Signed)
Patient returned your call,  She will be leaving to go out of town at lunch time.

## 2016-05-28 NOTE — Telephone Encounter (Signed)
Patient is aware of H&P on 06/01/16 @ 4:30pm with Pre-Admit to be scheduled, and OR on 06/18/16. Patient was given my ext.

## 2016-05-28 NOTE — Telephone Encounter (Signed)
Gave patient normal results and answered questions regarding the utility of CA125 in the setting of an ovarian cyst.

## 2016-06-01 ENCOUNTER — Ambulatory Visit: Payer: Medicare Other

## 2016-06-01 ENCOUNTER — Ambulatory Visit (INDEPENDENT_AMBULATORY_CARE_PROVIDER_SITE_OTHER): Payer: Medicare Other | Admitting: Obstetrics and Gynecology

## 2016-06-01 VITALS — BP 122/74 | Ht 61.0 in | Wt 136.0 lb

## 2016-06-01 DIAGNOSIS — R1909 Other intra-abdominal and pelvic swelling, mass and lump: Secondary | ICD-10-CM

## 2016-06-01 DIAGNOSIS — N838 Other noninflammatory disorders of ovary, fallopian tube and broad ligament: Secondary | ICD-10-CM

## 2016-06-01 DIAGNOSIS — N839 Noninflammatory disorder of ovary, fallopian tube and broad ligament, unspecified: Secondary | ICD-10-CM

## 2016-06-01 HISTORY — DX: Other noninflammatory disorders of ovary, fallopian tube and broad ligament: N83.8

## 2016-06-01 HISTORY — DX: Other intra-abdominal and pelvic swelling, mass and lump: R19.09

## 2016-06-01 NOTE — Progress Notes (Signed)
Preoperative History and Physical  Tracy Estrada is a 66 y.o. female here for preoperative visit for management of a left ovarian cyst.   History of Present Illness: The patient has had a CT scan, MRI, and ultrasound.  The size of the cyst is 2.4 x 2.3 x 2.3 cm with a few thin internal septations.  There is also a 82mm hypodense nodule that on ultrasound did not appear to have color doppler flow.  She has pelvic pain associated with her pain.   No significant preoperative concerns.  CA-125 one week ago was 8.6.   Proposed surgery: laparoscopic bilateral salpingo-oophorectomy, possible cystoscopy  Past Medical History:  Diagnosis Date  . Achilles bursitis or tendinitis   . Anxiety state, unspecified   . Arthritis   . Closed fracture of unspecified part of fibula   . Depressive disorder, not elsewhere classified   . Diverticulosis of colon (without mention of hemorrhage)   . Edema   . Fibromyalgia   . Insomnia, unspecified   . Lumbago   . Meniere's disease 1986  . MVP (mitral valve prolapse)   . Pain in joint, site unspecified   . Peptic ulcer    no testing  . Pure hypercholesterolemia   . TSH elevation    Past Surgical History:  Procedure Laterality Date  . ABDOMINAL HYSTERECTOMY  03/08/1980   fibroids; ovaries intact.  [facility] SPINE SURGERY     lumbar spine 11/2015; cervical spine 2009. [facility].   OB History  No data available  Patient denies any other pertinent gynecologic issues.   Current Outpatient Prescriptions on File Prior to Visit  Medication Sig Dispense Refill  . buPROPion (WELLBUTRIN XL) 300 MG 24 hr tablet Take 300 mg by mouth daily.    . Calcium Citrate (CITRACAL PO) Take 2 tablets by mouth daily.    . cholecalciferol (VITAMIN D) 1000 UNITS tablet Take 1,000 Units by mouth daily.     . Coenzyme Q10 60 MG TABS Take 60 mg by mouth daily.    . fluticasone (FLONASE) 50 MCG/ACT nasal spray Place 2 sprays into the nose daily as needed for allergies.     . Manganese 50 MG  TABS Take 50 mg by mouth daily.    . meloxicam (MOBIC) 7.5 MG tablet Take 1 tablet (7.5 mg total) by mouth daily as needed for pain. 90 tablet 1  . Multiple Vitamins-Minerals (MULTIVITAMIN WITH MINERALS) tablet Take 1 tablet by mouth daily. Centrum Silver for Women    . Omega-3 Fatty Acids (FISH OIL) 500 MG CAPS Take 500 mg by mouth daily.    . traZODone (DESYREL) 50 MG tablet Take 50-100 mg by mouth at bedtime as needed. for sleep  3   No current facility-administered medications on file prior to visit.    Allergies  Allergen Reactions  . Demerol [Meperidine] Nausea And Vomiting  . Kenalog [Triamcinolone Acetonide] Other (See Comments)    Cause vertigo/dizziness  . Adhesive [Tape] Rash and Other (See Comments)    PAPER TAPE OK    Social History:   reports that she has never smoked. She has never used smokeless tobacco. She reports that she drinks alcohol. She reports that she does not use drugs.  Family History  Problem Relation Age of Onset  . Hyperlipidemia Mother   . Hypertension Mother   . Colon polyps Mother   . Breast cancer Sister 55    with metastasis to bone; smoker  . Cancer Sister 24    breast cancer  .  Coronary artery disease Father      AMI age 49  . Heart disease Father 5    AMI x 2; first AMI in 78s  . Stroke Maternal Grandmother   . Heart disease Paternal Grandfather   . Heart attack Paternal Grandfather   . Colon cancer Maternal Uncle     dx. >50  . Colon cancer Paternal Aunt     dx. 18s  . Heart attack Paternal Uncle   . Alzheimer's disease Paternal Grandmother   . Breast cancer Cousin   . Ovarian cancer Cousin     dx. late 70s/dx. 60s  . Lung cancer Cousin     dx. 52s  . Stomach cancer Other     Review of Systems: Review of Systems  Constitutional: Negative.   HENT: Negative.   Eyes: Negative.   Respiratory: Negative.   Cardiovascular: Negative.   Gastrointestinal: Negative.   Genitourinary: Negative.   Musculoskeletal: Negative.   Skin:  Negative.   Neurological: Negative.   Psychiatric/Behavioral: Negative.      PHYSICAL EXAM: Blood pressure 122/74, height 5\' 1"  (1.549 m), weight 136 lb (61.7 kg). CONSTITUTIONAL: Well-developed, well-nourished female in no acute distress.  HENT:  Normocephalic, atraumatic, External right and left ear normal. Oropharynx is clear and moist EYES: Conjunctivae and EOM are normal. Pupils are equal, round, and reactive to light. No scleral icterus.  NECK: Normal range of motion, supple, no masses SKIN: Skin is warm and dry. No rash noted. Not diaphoretic. No erythema. No pallor. [place]: Alert and oriented to person, place, and time. Normal reflexes, muscle tone coordination. No cranial nerve deficit noted. PSYCHIATRIC: Normal mood and affect. Normal behavior. Normal judgment and thought content. CARDIOVASCULAR: Normal heart rate noted, regular rhythm RESPIRATORY: Effort and breath sounds normal, no problems with respiration noted ABDOMEN: Soft, nontender, nondistended. PELVIC: Deferred MUSCULOSKELETAL: Normal range of motion. No edema and no tenderness. 2+ distal pulses.  Labs: Results for orders placed or performed in visit on 05/26/16 (from the past 336 hour(s))  CA 125   Collection Time: 05/26/16  3:23 PM  Result Value Ref Range   CA 125 8.6 0.0 - 38.1 U/mL    Imaging Studies: Mr Pelvis W Wo Contrast  Result Date: 05/18/2016 CLINICAL DATA:  Pelvic pain for 2 months. Complex left adnexal cystic lesion seen on recent ultrasound. Family history of ovarian carcinoma . EXAM: MRI PELVIS WITHOUT AND WITH CONTRAST TECHNIQUE: Multiplanar multisequence MR imaging of the pelvis was performed both before and after administration of intravenous contrast. CONTRAST:  1mL MULTIHANCE GADOBENATE DIMEGLUMINE 529 MG/ML IV SOLN COMPARISON:  Ultrasound on 05/03/2016 FINDINGS: Urinary Tract: Unremarkable urinary bladder. Bowel: Sigmoid colonic diverticulosis noted, without evidence of diverticulitis.  Vascular/Lymphatic: No pathologically enlarged lymph nodes or other significant abnormality. Reproductive: Uterus:  Surgically absent. Right ovary: Appears normal.  No mass identified. Left ovary: A cystic lesion is seen in the left adnexa measuring 2.4 x 2.3 x 2.3 cm. This lesion shows a few thin internal septations, with a 7 mm T2 hypointense nodule without definite contrast enhancement. These characteristics are indeterminate but probably benign. Other: No abnormal free fluid. Musculoskeletal:  Unremarkable. IMPRESSION: 2.4 cm complex cystic lesion in left adnexa, with indeterminate but probably benign characteristics. This is suspicious for a cystic ovarian neoplasm in a postmenopausal female. Consider surgical evaluation or continued imaging followup with ultrasound in 6 months. Sigmoid diverticulosis. No radiographic evidence of diverticulitis . Electronically Signed   By: Earle Gell M.D.   On: 05/18/2016 10:10  US Transvaginal Non-ob  Result Date: 05/03/2016 CLINICAL DATA:  History of left ovarian cyst on previous CT with left lower quadrant pain EXAM: TRANSABDOMINAL AND TRANSVAGINAL ULTRASOUND OF PELVIS TECHNIQUE: Both transabdominal and transvaginal ultrasound examinations of the pelvis were performed. Transabdominal technique was performed for global imaging of the pelvis including uterus, ovaries, adnexal regions, and pelvic cul-de-sac. It was necessary to proceed with endovaginal exam following the transabdominal exam to visualize the ovaries. COMPARISON:  None FINDINGS: Uterus Surgically removed Right ovary Not visualized Left ovary Left ovary is not well visualized. There is a 2.6 cm complex cystic area in the left adnexa with some calcifications in nodularity. Other findings No abnormal free fluid. IMPRESSION: Postsurgical changes. 2.6 cm complex cystic area in the region of the left adnexae. No normal ovarian tissue is noted. This would correspond to that seen on prior CT by report. Given  patient's family history of ovarian carcinoma further characterization with MRI would be helpful. Electronically Signed   By: Inez Catalina M.D.   On: 05/03/2016 11:51   US Pelvis Complete  Result Date: 05/03/2016 CLINICAL DATA:  History of left ovarian cyst on previous CT with left lower quadrant pain EXAM: TRANSABDOMINAL AND TRANSVAGINAL ULTRASOUND OF PELVIS TECHNIQUE: Both transabdominal and transvaginal ultrasound examinations of the pelvis were performed. Transabdominal technique was performed for global imaging of the pelvis including uterus, ovaries, adnexal regions, and pelvic cul-de-sac. It was necessary to proceed with endovaginal exam following the transabdominal exam to visualize the ovaries. COMPARISON:  None FINDINGS: Uterus Surgically removed Right ovary Not visualized Left ovary Left ovary is not well visualized. There is a 2.6 cm complex cystic area in the left adnexa with some calcifications in nodularity. Other findings No abnormal free fluid. IMPRESSION: Postsurgical changes. 2.6 cm complex cystic area in the region of the left adnexae. No normal ovarian tissue is noted. This would correspond to that seen on prior CT by report. Given patient's family history of ovarian carcinoma further characterization with MRI would be helpful. Electronically Signed   By: Inez Catalina M.D.   On: 05/03/2016 11:51    Assessment: Left ovarian mass with complex features, pelvic pain  Plan: Patient will undergo surgical management with laparoscopic bilateral salpingo-oophorectomy, possible cystoscopy.   The risks of surgery were discussed in detail with the patient including but not limited to: bleeding which may require transfusion or reoperation; infection which may require antibiotics; injury to surrounding organs which may involve bowel, bladder, ureters ; need for additional procedures including laparoscopy or laparotomy; thromboembolic phenomenon, surgical site problems and other  postoperative/anesthesia complications. Likelihood of success in alleviating the patient's condition was discussed. Routine postoperative instructions will be reviewed with the patient and her family in detail after surgery.  The patient concurred with the proposed plan, giving informed written consent for the surgery.  Patient has been NPO since last night she will remain NPO for procedure.  Anesthesia and OR aware.  Preoperative prophylactic antibiotics and SCDs ordered on call to the OR.    Prentice Docker, MD 06/01/2016 4:56 PM

## 2016-06-03 ENCOUNTER — Encounter
Admission: RE | Admit: 2016-06-03 | Discharge: 2016-06-03 | Disposition: A | Discharge status: Home / Self Care | Payer: Medicare Other | Source: Ambulatory Visit | Attending: Obstetrics and Gynecology | Admitting: Obstetrics and Gynecology

## 2016-06-03 DIAGNOSIS — I498 Other specified cardiac arrhythmias: Secondary | ICD-10-CM | POA: Insufficient documentation

## 2016-06-03 DIAGNOSIS — Z01812 Encounter for preprocedural laboratory examination: Secondary | ICD-10-CM | POA: Diagnosis not present

## 2016-06-03 DIAGNOSIS — N839 Noninflammatory disorder of ovary, fallopian tube and broad ligament, unspecified: Secondary | ICD-10-CM | POA: Diagnosis not present

## 2016-06-03 DIAGNOSIS — R1909 Other intra-abdominal and pelvic swelling, mass and lump: Secondary | ICD-10-CM | POA: Diagnosis not present

## 2016-06-03 DIAGNOSIS — Z01810 Encounter for preprocedural cardiovascular examination: Secondary | ICD-10-CM | POA: Insufficient documentation

## 2016-06-03 LAB — CBC
HEMATOCRIT: 39.4 % (ref 35.0–47.0)
Hemoglobin: 13.6 g/dL (ref 12.0–16.0)
MCH: 33.1 pg (ref 26.0–34.0)
MCHC: 34.6 g/dL (ref 32.0–36.0)
MCV: 95.6 fL (ref 80.0–100.0)
PLATELETS: 185 10*3/uL (ref 150–440)
RBC: 4.12 MIL/uL (ref 3.80–5.20)
RDW: 12.5 % (ref 11.5–14.5)
WBC: 3.9 10*3/uL (ref 3.6–11.0)

## 2016-06-03 LAB — TYPE AND SCREEN
ABO/RH(D): B POS
Antibody Screen: NEGATIVE

## 2016-06-03 LAB — COMPREHENSIVE METABOLIC PANEL
ALBUMIN: 4.4 g/dL (ref 3.5–5.0)
ALT: 18 U/L (ref 14–54)
AST: 22 U/L (ref 15–41)
Alkaline Phosphatase: 45 U/L (ref 38–126)
Anion gap: 5 (ref 5–15)
BILIRUBIN TOTAL: 0.8 mg/dL (ref 0.3–1.2)
BUN: 14 mg/dL (ref 6–20)
CHLORIDE: 105 mmol/L (ref 101–111)
CO2: 30 mmol/L (ref 22–32)
Calcium: 10 mg/dL (ref 8.9–10.3)
Creatinine, Ser: 0.55 mg/dL (ref 0.44–1.00)
GFR calc Af Amer: >60 mL/min (ref >60)
Glucose, Bld: 71 mg/dL (ref 65–99)
POTASSIUM: 3.8 mmol/L (ref 3.5–5.1)
Sodium: 140 mmol/L (ref 135–145)
TOTAL PROTEIN: 7.1 g/dL (ref 6.5–8.1)

## 2016-06-03 NOTE — Patient Instructions (Signed)
Your procedure is scheduled on: 06/08/16 Tues Report to Same Day Surgery 2nd floor medical mall [facility] Entrance-take elevator on left to 2nd floor.  Check in with surgery information desk.) To find out your arrival time please call [phone] between 1PM - 3PM on 06/07/16 Mon  Remember: Instructions that are not followed completely may result in serious medical risk, up to and including death, or upon the discretion of your surgeon and anesthesiologist your surgery may need to be rescheduled.    _x___ 1. Do not eat food or drink liquids after midnight. No gum chewing or                              hard candies.     __x__ 2. No Alcohol for 24 hours before or after surgery.   __x__3. No Smoking for 24 prior to surgery.   ____  4. Bring all medications with you on the day of surgery if instructed.    __x__ 5. Notify your doctor if there is any change in your medical condition     (cold, fever, infections).     Do not wear jewelry, make-up, hairpins, clips or nail polish.  Do not wear lotions, powders, or perfumes. You may wear deodorant.  Do not shave 48 hours prior to surgery. Men may shave face and neck.  Do not bring valuables to the hospital.    [facility] is not responsible for any belongings or valuables.               Contacts, dentures or bridgework may not be worn into surgery.  Leave your suitcase in the car. After surgery it may be brought to your room.  For patients admitted to the hospital, discharge time is determined by your                       treatment team.   Patients discharged the day of surgery will not be allowed to drive home.  You will need someone to drive you home and stay with you the night of your procedure.    Please read over the following fact sheets that you were given:   [facility] Preparing for Surgery and or MRSA Information   _x___ Take anti-hypertensive (unless it includes a diuretic), cardiac, seizure, asthma,     anti-reflux and  psychiatric medicines. These include:  1. buPROPion (WELLBUTRIN XL  2.  3.  4.  5.  6.  ____Fleets enema or Magnesium Citrate as directed.   _x___ Use CHG Soap or sage wipes as directed on instruction sheet   ____ Use inhalers on the day of surgery and bring to hospital day of surgery  ____ Stop Metformin and Janumet 2 days prior to surgery.    ____ Take 1/2 of usual insulin dose the night before surgery and none on the morning     surgery.   _x___ Follow recommendations from Cardiologist, Pulmonologist or PCP regarding          stopping Aspirin, Coumadin, Pllavix ,Eliquis, Effient, or Pradaxa, and Pletal.  X____Stop Anti-inflammatories such as Advil, Aleve, Ibuprofen, Motrin, Naproxen, Naprosyn, Goodies powders or aspirin products. Stopped Meloxicam last week OK to take Tylenol and                          Celebrex.   _x___ Stop supplements until after surgery. Stopped Fish  oil last week.  But may continue Vitamin D, Vitamin B,       and multivitamin.   ____ Bring C-Pap to the hospital.

## 2016-06-08 ENCOUNTER — Ambulatory Visit
Admission: RE | Admit: 2016-06-08 | Discharge: 2016-06-08 | Disposition: A | Discharge status: Home / Self Care | Payer: Medicare Other | Source: Ambulatory Visit | Attending: Obstetrics and Gynecology | Admitting: Obstetrics and Gynecology

## 2016-06-08 ENCOUNTER — Encounter: Payer: Self-pay | Admitting: Anesthesiology

## 2016-06-08 ENCOUNTER — Encounter
Admission: RE | Disposition: A | Discharge status: Home / Self Care | Payer: Self-pay | Source: Ambulatory Visit | Attending: Obstetrics and Gynecology

## 2016-06-08 ENCOUNTER — Ambulatory Visit: Payer: Medicare Other | Admitting: Anesthesiology

## 2016-06-08 DIAGNOSIS — F329 Major depressive disorder, single episode, unspecified: Secondary | ICD-10-CM | POA: Diagnosis not present

## 2016-06-08 DIAGNOSIS — I341 Nonrheumatic mitral (valve) prolapse: Secondary | ICD-10-CM | POA: Diagnosis not present

## 2016-06-08 DIAGNOSIS — F419 Anxiety disorder, unspecified: Secondary | ICD-10-CM | POA: Diagnosis not present

## 2016-06-08 DIAGNOSIS — Z7951 Long term (current) use of inhaled steroids: Secondary | ICD-10-CM | POA: Insufficient documentation

## 2016-06-08 DIAGNOSIS — N83202 Unspecified ovarian cyst, left side: Secondary | ICD-10-CM | POA: Diagnosis not present

## 2016-06-08 DIAGNOSIS — Z8041 Family history of malignant neoplasm of ovary: Secondary | ICD-10-CM | POA: Diagnosis not present

## 2016-06-08 DIAGNOSIS — N838 Other noninflammatory disorders of ovary, fallopian tube and broad ligament: Secondary | ICD-10-CM | POA: Diagnosis present

## 2016-06-08 DIAGNOSIS — R1909 Other intra-abdominal and pelvic swelling, mass and lump: Secondary | ICD-10-CM | POA: Diagnosis not present

## 2016-06-08 DIAGNOSIS — Z79899 Other long term (current) drug therapy: Secondary | ICD-10-CM | POA: Diagnosis not present

## 2016-06-08 DIAGNOSIS — R1900 Intra-abdominal and pelvic swelling, mass and lump, unspecified site: Secondary | ICD-10-CM | POA: Diagnosis not present

## 2016-06-08 DIAGNOSIS — D271 Benign neoplasm of left ovary: Secondary | ICD-10-CM | POA: Diagnosis not present

## 2016-06-08 HISTORY — PX: CYSTOSCOPY: SHX5120

## 2016-06-08 HISTORY — PX: LAPAROSCOPIC SALPINGO OOPHERECTOMY: SHX5927

## 2016-06-08 HISTORY — PX: LAPAROSCOPIC LYSIS OF ADHESIONS: SHX5905

## 2016-06-08 LAB — ABO/RH: ABO/RH(D): B POS

## 2016-06-08 SURGERY — SALPINGO-OOPHORECTOMY, LAPAROSCOPIC
Anesthesia: General

## 2016-06-08 MED ORDER — ONDANSETRON HCL 4 MG/2ML IJ SOLN
INTRAMUSCULAR | Status: AC
  Filled 2016-06-08: qty 2

## 2016-06-08 MED ORDER — FENTANYL CITRATE (PF) 100 MCG/2ML IJ SOLN
INTRAMUSCULAR | Status: AC
  Filled 2016-06-08: qty 2

## 2016-06-08 MED ORDER — ONDANSETRON HCL 4 MG/2ML IJ SOLN
INTRAMUSCULAR | Status: AC
  Administered 2016-06-08: 4 mg via INTRAVENOUS
  Filled 2016-06-08: qty 2

## 2016-06-08 MED ORDER — MIDAZOLAM HCL 2 MG/2ML IJ SOLN
INTRAMUSCULAR | Status: AC
  Filled 2016-06-08: qty 2

## 2016-06-08 MED ORDER — FENTANYL CITRATE (PF) 100 MCG/2ML IJ SOLN
INTRAMUSCULAR | Status: DC | PRN
  Administered 2016-06-08 (×4): 25 ug via INTRAVENOUS
  Administered 2016-06-08: 50 ug via INTRAVENOUS

## 2016-06-08 MED ORDER — PROPOFOL 10 MG/ML IV BOLUS
INTRAVENOUS | Status: AC
  Filled 2016-06-08: qty 20

## 2016-06-08 MED ORDER — PROMETHAZINE HCL 25 MG/ML IJ SOLN
6.2500 mg | Freq: Once | INTRAMUSCULAR | Status: AC
  Administered 2016-06-08: 6.25 mg via INTRAVENOUS

## 2016-06-08 MED ORDER — NEOSTIGMINE METHYLSULFATE 5 MG/5ML IV SOSY
PREFILLED_SYRINGE | INTRAVENOUS | Status: AC
  Filled 2016-06-08: qty 5

## 2016-06-08 MED ORDER — FENTANYL CITRATE (PF) 100 MCG/2ML IJ SOLN: 25.0000 ug | INTRAMUSCULAR | Status: DC | PRN

## 2016-06-08 MED ORDER — PROPOFOL 10 MG/ML IV BOLUS
INTRAVENOUS | Status: DC | PRN
  Administered 2016-06-08: 20 mg via INTRAVENOUS
  Administered 2016-06-08: 150 mg via INTRAVENOUS

## 2016-06-08 MED ORDER — HYDROCODONE-ACETAMINOPHEN 5-325 MG PO TABS: 1.0000 | ORAL_TABLET | ORAL | 0 refills | Status: DC | PRN

## 2016-06-08 MED ORDER — FAMOTIDINE 20 MG PO TABS
20.0000 mg | ORAL_TABLET | Freq: Once | ORAL | Status: AC
  Administered 2016-06-08: 20 mg via ORAL

## 2016-06-08 MED ORDER — MIDAZOLAM HCL 2 MG/2ML IJ SOLN
INTRAMUSCULAR | Status: DC | PRN
  Administered 2016-06-08: 1 mg via INTRAVENOUS
  Administered 2016-06-08 (×2): 0.5 mg via INTRAVENOUS

## 2016-06-08 MED ORDER — SEVOFLURANE IN SOLN
RESPIRATORY_TRACT | Status: AC
  Filled 2016-06-08: qty 250

## 2016-06-08 MED ORDER — LACTATED RINGERS IV SOLN: INTRAVENOUS | Status: DC

## 2016-06-08 MED ORDER — OXYCODONE HCL 5 MG PO TABS: 5.0000 mg | ORAL_TABLET | Freq: Once | ORAL | Status: DC | PRN

## 2016-06-08 MED ORDER — ONDANSETRON HCL 4 MG/2ML IJ SOLN
INTRAMUSCULAR | Status: DC | PRN
  Administered 2016-06-08: 4 mg via INTRAVENOUS

## 2016-06-08 MED ORDER — LACTATED RINGERS IV SOLN
INTRAVENOUS | Status: DC
  Administered 2016-06-08 (×3): via INTRAVENOUS

## 2016-06-08 MED ORDER — NEOSTIGMINE METHYLSULFATE 10 MG/10ML IV SOLN
INTRAVENOUS | Status: DC | PRN
  Administered 2016-06-08: 3 mg via INTRAVENOUS

## 2016-06-08 MED ORDER — BUPIVACAINE HCL (PF) 0.5 % IJ SOLN
INTRAMUSCULAR | Status: DC | PRN
  Administered 2016-06-08: 10 mL

## 2016-06-08 MED ORDER — FAMOTIDINE 20 MG PO TABS
ORAL_TABLET | ORAL | Status: AC
  Filled 2016-06-08: qty 1

## 2016-06-08 MED ORDER — ROCURONIUM BROMIDE 50 MG/5ML IV SOLN
INTRAVENOUS | Status: AC
  Filled 2016-06-08: qty 1

## 2016-06-08 MED ORDER — GLYCOPYRROLATE 0.2 MG/ML IJ SOLN
INTRAMUSCULAR | Status: AC
  Filled 2016-06-08: qty 2

## 2016-06-08 MED ORDER — ONDANSETRON HCL 4 MG/2ML IJ SOLN
4.0000 mg | Freq: Once | INTRAMUSCULAR | Status: AC
  Administered 2016-06-08: 4 mg via INTRAVENOUS

## 2016-06-08 MED ORDER — BUPIVACAINE HCL (PF) 0.5 % IJ SOLN
INTRAMUSCULAR | Status: AC
  Filled 2016-06-08: qty 30

## 2016-06-08 MED ORDER — LIDOCAINE HCL (PF) 2 % IJ SOLN
INTRAMUSCULAR | Status: AC
  Filled 2016-06-08: qty 2

## 2016-06-08 MED ORDER — ROCURONIUM BROMIDE 100 MG/10ML IV SOLN
INTRAVENOUS | Status: DC | PRN
  Administered 2016-06-08: 10 mg via INTRAVENOUS
  Administered 2016-06-08: 30 mg via INTRAVENOUS
  Administered 2016-06-08 (×2): 10 mg via INTRAVENOUS

## 2016-06-08 MED ORDER — IBUPROFEN 600 MG PO TABS: 600.0000 mg | ORAL_TABLET | Freq: Four times a day (QID) | ORAL | 0 refills | Status: DC | PRN

## 2016-06-08 MED ORDER — PROMETHAZINE HCL 25 MG/ML IJ SOLN
INTRAMUSCULAR | Status: AC
  Administered 2016-06-08: 6.25 mg via INTRAVENOUS
  Filled 2016-06-08: qty 1

## 2016-06-08 MED ORDER — SUCCINYLCHOLINE CHLORIDE 20 MG/ML IJ SOLN
INTRAMUSCULAR | Status: AC
  Filled 2016-06-08: qty 1

## 2016-06-08 MED ORDER — OXYCODONE HCL 5 MG/5ML PO SOLN: 5.0000 mg | Freq: Once | ORAL | Status: DC | PRN

## 2016-06-08 MED ORDER — GLYCOPYRROLATE 0.2 MG/ML IJ SOLN
INTRAMUSCULAR | Status: DC | PRN
  Administered 2016-06-08: .5 mg via INTRAVENOUS

## 2016-06-08 MED ORDER — LIDOCAINE HCL (CARDIAC) 20 MG/ML IV SOLN
INTRAVENOUS | Status: DC | PRN
  Administered 2016-06-08: 30 mg via INTRAVENOUS

## 2016-06-08 SURGICAL SUPPLY — 56 items
ANCHOR TIS RET SYS 235ML (MISCELLANEOUS) ×4 IMPLANT
APPLICATOR ARISTA FLEXITIP XL (MISCELLANEOUS) ×4 IMPLANT
BAG URO DRAIN 2000ML W/SPOUT (MISCELLANEOUS) ×4 IMPLANT
BLADE SURG SZ11 CARB STEEL (BLADE) ×4 IMPLANT
CANISTER SUCT 1200ML W/VALVE (MISCELLANEOUS) ×4 IMPLANT
CATH FOL 2WAY LX 16X5 (CATHETERS) ×4 IMPLANT
CATH FOLEY 2WAY  5CC 16FR (CATHETERS) ×1
CATH ROBINSON RED A/P 16FR (CATHETERS) ×4 IMPLANT
CATH URTH 16FR FL 2W BLN LF (CATHETERS) ×3 IMPLANT
CHLORAPREP W/TINT 26ML (MISCELLANEOUS) ×4 IMPLANT
DEFOGGER SCOPE WARMER CLEARIFY (MISCELLANEOUS) ×4 IMPLANT
DRAPE LEGGINS SURG 28X43 STRL (DRAPES) ×4 IMPLANT
DRAPE SHEET LG 3/4 BI-LAMINATE (DRAPES) ×4 IMPLANT
DRAPE UNDER BUTTOCK W/FLU (DRAPES) ×4 IMPLANT
GLOVE BIO SURGEON STRL SZ7 (GLOVE) ×4 IMPLANT
GLOVE BIOGEL PI IND STRL 7.5 (GLOVE) ×3 IMPLANT
GLOVE BIOGEL PI INDICATOR 7.5 (GLOVE) ×1
GOWN STRL REUS W/ TWL LRG LVL3 (GOWN DISPOSABLE) ×9 IMPLANT
GOWN STRL REUS W/ TWL XL LVL3 (GOWN DISPOSABLE) ×3 IMPLANT
GOWN STRL REUS W/TWL LRG LVL3 (GOWN DISPOSABLE) ×3
GOWN STRL REUS W/TWL XL LVL3 (GOWN DISPOSABLE) ×1
HEMOSTAT ARISTA ABSORB 3G PWDR (MISCELLANEOUS) ×4 IMPLANT
IRRIGATION STRYKERFLOW (MISCELLANEOUS) ×3 IMPLANT
IRRIGATOR STRYKERFLOW (MISCELLANEOUS) ×4
IV LACTATED RINGERS 1000ML (IV SOLUTION) ×4 IMPLANT
KIT RM TURNOVER CYSTO AR (KITS) ×4 IMPLANT
LABEL OR SOLS (LABEL) ×4 IMPLANT
LIGASURE BLUNT 5MM 37CM (INSTRUMENTS) ×4 IMPLANT
LIQUID BAND (GAUZE/BANDAGES/DRESSINGS) ×4 IMPLANT
NEEDLE HYPO 25GX1X1/2 BEV (NEEDLE) ×4 IMPLANT
NS IRRIG 500ML POUR BTL (IV SOLUTION) ×4 IMPLANT
PACK LAP CHOLECYSTECTOMY (MISCELLANEOUS) ×4 IMPLANT
PAD OB MATERNITY 4.3X12.25 (PERSONAL CARE ITEMS) ×4 IMPLANT
PAD PREP 24X41 OB/GYN DISP (PERSONAL CARE ITEMS) ×4 IMPLANT
POUCH ENDO CATCH 10MM SPEC (MISCELLANEOUS) ×4 IMPLANT
SCISSORS METZENBAUM CVD 33 (INSTRUMENTS) ×4 IMPLANT
SET CYSTO W/LG BORE CLAMP LF (SET/KITS/TRAYS/PACK) ×4 IMPLANT
SHEARS HARMONIC ACE PLUS 36CM (ENDOMECHANICALS) IMPLANT
SLEEVE ENDOPATH XCEL 5M (ENDOMECHANICALS) ×8 IMPLANT
SOL PREP PVP 2OZ (MISCELLANEOUS) ×4
SOLUTION PREP PVP 2OZ (MISCELLANEOUS) ×3 IMPLANT
SPONGE XRAY 4X4 16PLY STRL (MISCELLANEOUS) IMPLANT
SURGILUBE 2OZ TUBE FLIPTOP (MISCELLANEOUS) ×4 IMPLANT
SUT MNCRL 3-0 UNDYED SH (SUTURE) IMPLANT
SUT MONOCRYL 3-0 UNDYED (SUTURE)
SUT VIC AB 0 CT1 36 (SUTURE) ×4 IMPLANT
SUT VIC AB 0 CT2 27 (SUTURE) ×4 IMPLANT
SUT VIC AB 2-0 UR6 27 (SUTURE) IMPLANT
SUT VIC AB 4-0 FS2 27 (SUTURE) ×4 IMPLANT
SUT VIC AB 4-0 PS2 18 (SUTURE) ×8 IMPLANT
SYR 50ML LL SCALE MARK (SYRINGE) ×4 IMPLANT
TOWEL OR 17X26 4PK STRL BLUE (TOWEL DISPOSABLE) ×4 IMPLANT
TROCAR ENDO BLADELESS 11MM (ENDOMECHANICALS) ×4 IMPLANT
TROCAR XCEL NON-BLD 5MMX100MML (ENDOMECHANICALS) ×4 IMPLANT
TUBING INSUFFLATOR HI FLOW (MISCELLANEOUS) ×4 IMPLANT
WATER STERILE IRR 3000ML UROMA (IV SOLUTION) IMPLANT

## 2016-06-08 NOTE — Anesthesia Procedure Notes (Signed)
Procedure Name: Intubation Date/Time: 06/08/2016 7:40 AM Performed by: Courtney Paris Pre-anesthesia Checklist: Patient identified, Patient being monitored, Timeout performed, Emergency Drugs available and Suction available Patient Re-evaluated:Patient Re-evaluated prior to inductionOxygen Delivery Method: Circle system utilized Preoxygenation: Pre-oxygenation with 100% oxygen Intubation Type: IV induction and Combination inhalational/ intravenous induction Ventilation: Mask ventilation without difficulty Laryngoscope Size: 3 and Miller Grade View: Grade II Tube type: Oral Tube size: 7.0 mm Number of attempts: 1 Airway Equipment and Method: Stylet Placement Confirmation: ETT inserted through vocal cords under direct vision,  positive ETCO2,  breath sounds checked- equal and bilateral and CO2 detector Secured at: 21 cm Tube secured with: Tape Dental Injury: Teeth and Oropharynx as per pre-operative assessment

## 2016-06-08 NOTE — H&P (Signed)
History and Physical Interval Note:  Tracy Estrada  has presented today for surgery, with the diagnosis of other intraabdominal mass,mass of left ovary  The various methods of treatment have been discussed with the patient and family. After consideration of risks, benefits and other options for treatment, the patient has consented to  Procedure(s): LAPAROSCOPIC SALPINGO OOPHORECTOMY (Bilateral) CYSTOSCOPY (N/A) as a surgical intervention .  The patient's history has been reviewed, patient examined, no change in status, stable for surgery.  I have reviewed the patient's chart and labs.  Questions were answered to the patient's satisfaction.    Prentice Docker, MD 06/08/2016 7:28 AM

## 2016-06-08 NOTE — Transfer of Care (Signed)
Immediate Anesthesia Transfer of Care Note  Patient: Tracy Estrada  Procedure(s) Performed: Procedure(s): LAPAROSCOPIC SALPINGO OOPHORECTOMY (Left) CYSTOSCOPY (N/A) LAPAROSCOPIC LYSIS OF ADHESIONS  Patient Location: PACU  Anesthesia Type:General  Level of Consciousness: awake and patient cooperative  Airway & Oxygen Therapy: Patient Spontanous Breathing and Patient connected to face mask oxygen  Post-op Assessment: Report given to RN and Post -op Vital signs reviewed and stable  Post vital signs: Reviewed and stable  Last Vitals:  Vitals:   06/08/16 0634  BP: (!) 140/96  Pulse: 87  Resp: 16  Temp: 36.2 C    Last Pain:  Vitals:   06/08/16 0634  TempSrc: Tympanic  PainSc: 2          Complications: No apparent anesthesia complications

## 2016-06-08 NOTE — Anesthesia Preprocedure Evaluation (Signed)
Anesthesia Evaluation  Patient identified by MRN, date of birth, ID band Patient awake    Reviewed: Allergy & Precautions, H&P , NPO status , Patient's Chart, lab work & pertinent test results  History of Anesthesia Complications Negative for: history of anesthetic complications  Airway Mallampati: III  TM Distance: >3 FB Neck ROM: limited    Dental  (+) Poor Dentition, Chipped, Caps   Pulmonary neg pulmonary ROS, neg shortness of breath,    Pulmonary exam normal breath sounds clear to auscultation       Cardiovascular Exercise Tolerance: Good (-) angina(-) Past MI and (-) DOE negative cardio ROS Normal cardiovascular exam Rhythm:regular Rate:Normal     Neuro/Psych PSYCHIATRIC DISORDERS Anxiety Depression negative neurological ROS     GI/Hepatic Neg liver ROS, PUD,   Endo/Other  negative endocrine ROS  Renal/GU      Musculoskeletal  (+) Arthritis , Fibromyalgia -  Abdominal   Peds  Hematology negative hematology ROS (+)   Anesthesia Other Findings Past Medical History: No date: Achilles bursitis or tendinitis No date: Anxiety state, unspecified No date: Arthritis No date: Closed fracture of unspecified part of fibula No date: Depressive disorder, not elsewhere classified No date: Diverticulosis of colon (without mention of he* No date: Edema No date: Fibromyalgia No date: Insomnia, unspecified No date: Lumbago 1986: Meniere's disease No date: MVP (mitral valve prolapse) No date: Pain in joint, site unspecified No date: Peptic ulcer     Comment: no testing No date: Pure hypercholesterolemia No date: TSH elevation  Past Surgical History: 03/08/1980: ABDOMINAL HYSTERECTOMY     Comment: fibroids; ovaries intact. No date: BACK SURGERY     Comment: x2 Cervical and lumbar No date: SPINE SURGERY     Comment: lumbar spine 11/2015; cervical spine 2009.               [facility].  BMI    Body Mass Index:  23.43  kg/m      Reproductive/Obstetrics negative OB ROS                             Anesthesia Physical Anesthesia Plan  ASA: III  Anesthesia Plan: General ETT   Post-op Pain Management:    Induction:   Airway Management Planned:   Additional Equipment:   Intra-op Plan:   Post-operative Plan:   Informed Consent: I have reviewed the patients History and Physical, chart, labs and discussed the procedure including the risks, benefits and alternatives for the proposed anesthesia with the patient or authorized representative who has indicated his/her understanding and acceptance.   Dental Advisory Given  Plan Discussed with: Anesthesiologist, CRNA and Surgeon  Anesthesia Plan Comments:         Anesthesia Quick Evaluation

## 2016-06-08 NOTE — Op Note (Signed)
Operative Note    Pre-Op Diagnosis: left ovarian mass  Post-Op Diagnosis: left ovarian mass  Procedures:  1. Operative laparoscopy 2. Lysis of adhesions (> 60 minutes) 3. Left salpingo-oophorectomy 4. Cystoscopy  Primary Surgeon: Prentice Docker, MD   EBL: 150 mL   IVF: 1,200  mL   Specimens:  1) pelvic washings 2) left ovary, fallopian tube, and mass  Drains: none  Complications: None   Disposition: PACU   Condition: Stable   Findings:   1) Dense adhesions of left and right ovary and fallopian tube to pelvic wall and bowels 2) no evidence of bladder injury and there was normal efflux of urine from the bilateral ureteral orifices 3) left ovarian cystic mass  Procedure Summary:  The patient was taken to the operating room where general anesthesia was administered and found to be adequate. She was placed in the dorsal supine lithotomy position in [place] stirrups and prepped and draped in usual sterile fashion. After a timeout was called an indwelling catheter was placed in her bladder.   Attention was turned to the abdomen where after injection of local anesthetic, a 5 mm infraumbilical incision was made with the scalpel. Entry into the abdomen was obtained via Optiview trocar technique (a blunt entry technique with camera visualization through the obturator upon entry). Verification of entry into the abdomen was obtained using opening pressures. The abdomen was insufflated with CO2. The camera was introduced through the trocar with verification of atraumatic entry. A survey of the pelvis was undertaken with the above-noted findings. Pelvic washings were taken at this time. There were adhesions of the mesentery and epiploica of the bowel to the right lower quadrant anterior abdominal wall. These were taken down carefully so that a 5 mm right lower quadrant port site could be created under direct intra-abdominal camera visualization. This occurred without incident. An 11 mm  suprapubic port site was created under direct intra-abdominal camera visualization without difficulty. The left ovary was visualized along with the cystic mass. Given the dense adhesions of bowel to ovary and difficulty in locating the infundibulopelvic ligament, the bowel was freed from the left pelvic sidewall at the pelvic inlet. The retroperitoneal space was entered and developed parallel and lateral to the infundibulopelvic ligament. The retroperitoneal space was developed in the pelvis until the ureter was identified and found to be well away from the area of interest. The ovary and fallopian tube were carefully dissected away from the bowel and pelvic wall structures until they were freed and the IP ligament could be freed, as well. The IP ligament was taken above the pelvic inlet with hemostasis noted and the fallopian tube ovary and cystic mass were removed through an Endo Catch bag through the suprapubic port.  The retroperitoneal space and the IP ligament was again verified to be hemostatic. In a similar fashion the right retroperitoneal space was opened and developed. However the right ovary was entombed in adhesions of bowel and pelvic sidewall. Attempt was made to identify the ureter on the right side. However, given the dense adhesions of the bowel to the right pelvic brim that could not be removed the ureter could not be definitively identified. Given that I could not definitely identify the right ureter, I could not continue to develop the retroperitoneal space and free the right fallopian tube and ovary and IP ligament. Given there was no concern for pathology on the right side and the right ovary and fallopian tube were to be taken as a prophylactic measure,  the benefit of leaving the ovary and fallopian tube in place was deemed to outweigh the risk of damage to nearby surgical structures.   At this point attention was turned to the cystoscopy. The cystoscope was introduced through the urethra  after removal of the catheter. The bladder was infused with 300 mL's of sterile saline. Bladder was found to be intact and reflux of urine was noted from each ureteral orifice. There was no evidence of bleeding or disruption to the bladder wall. The cystoscope was removed and the catheter was replaced.  Attention was returned to the laparoscopic portion of the case where the abdomen had been desufflated of CO2. The abdomen was reinsufflated and hemostasis was verified. As a prophylactic measure, 3 g of Arista was placed in the retroperitoneal space. The suprapubic trocar was removed and using a fascial closure device and a 0 Vicryl stitch the fascia was reapproximated. The abdomen was desufflated of CO2 and 5 deep breaths were given by anesthesia. The trochars were removed without difficulty. The subcutaneous tissue was reapproximated to reduce closure tension on the skin on all sites except the suprapubic. The skin was closed at all port sites using surgical skin glue. The suprapubic port skin was reapproximated using 4-0 Vicryl and then with surgical skin glue.  The catheter was then removed from the bladder.   The patient tolerated the procedure well.  Sponge, lap, needle, and instrument counts were correct x 2.  VTE prophylaxis: SCDs. Antibiotic prophylaxis: none indicated and none given. She was awakened in the operating room and was taken to the PACU in stable condition.   Prentice Docker, MD 06/08/2016 11:09 AM

## 2016-06-08 NOTE — Anesthesia Post-op Follow-up Note (Cosign Needed)
Anesthesia QCDR form completed.        

## 2016-06-08 NOTE — Discharge Instructions (Signed)
For medication usage, hold Meloxicam until you no longer need ibuprofen for surgical pain     AMBULATORY SURGERY  DISCHARGE INSTRUCTIONS   1) The drugs that you were given will stay in your system until tomorrow so for the next 24 hours you should not:  A) Drive an automobile B) Make any legal decisions C) Drink any alcoholic beverage   2) You may resume regular meals tomorrow.  Today it is better to start with liquids and gradually work up to solid foods.  You may eat anything you prefer, but it is better to start with liquids, then soup and crackers, and gradually work up to solid foods.   3) Please notify your doctor immediately if you have any unusual bleeding, trouble breathing, redness and pain at the surgery site, drainage, fever, or pain not relieved by medication.    4) Additional Instructions:        Please contact your physician with any problems or Same Day Surgery at [phone], Monday through Friday 6 am to 4 pm, or [place] at [facility] number at [phone].

## 2016-06-09 ENCOUNTER — Encounter: Payer: Self-pay | Admitting: Obstetrics and Gynecology

## 2016-06-09 LAB — CYTOLOGY - NON PAP

## 2016-06-09 LAB — SURGICAL PATHOLOGY

## 2016-06-10 NOTE — Anesthesia Postprocedure Evaluation (Signed)
Anesthesia Post Note  Patient: Tracy Estrada  Procedure(s) Performed: Procedure(s) (LRB): LAPAROSCOPIC SALPINGO OOPHORECTOMY (Left) CYSTOSCOPY (N/A) LAPAROSCOPIC LYSIS OF ADHESIONS  Patient location during evaluation: PACU Anesthesia Type: General Level of consciousness: awake and alert Pain management: pain level controlled Vital Signs Assessment: post-procedure vital signs reviewed and stable Respiratory status: spontaneous breathing, nonlabored ventilation, respiratory function stable and patient connected to nasal cannula oxygen Cardiovascular status: blood pressure returned to baseline and stable Postop Assessment: no signs of nausea or vomiting Anesthetic complications: no     Last Vitals:  Vitals:   06/08/16 1216 06/08/16 1344  BP: (!) 152/69 134/62  Pulse: 74   Resp: 18   Temp: (!) 36.1 C     Last Pain:  Vitals:   06/09/16 0819  TempSrc:   PainSc: 0-No pain                 Martha Clan

## 2016-06-15 ENCOUNTER — Ambulatory Visit (INDEPENDENT_AMBULATORY_CARE_PROVIDER_SITE_OTHER): Payer: Medicare Other | Admitting: Obstetrics and Gynecology

## 2016-06-15 ENCOUNTER — Encounter: Payer: Self-pay | Admitting: Obstetrics and Gynecology

## 2016-06-15 VITALS — BP 118/70 | Ht 61.0 in | Wt 130.0 lb

## 2016-06-15 DIAGNOSIS — N839 Noninflammatory disorder of ovary, fallopian tube and broad ligament, unspecified: Secondary | ICD-10-CM

## 2016-06-15 DIAGNOSIS — R1909 Other intra-abdominal and pelvic swelling, mass and lump: Secondary | ICD-10-CM

## 2016-06-15 DIAGNOSIS — N838 Other noninflammatory disorders of ovary, fallopian tube and broad ligament: Secondary | ICD-10-CM

## 2016-06-15 NOTE — Progress Notes (Signed)
   Postoperative Follow-up Patient presents post op from a left salpingo-oophrectomy 1weeks ago for adnexal mass.  Subjective: Patient reports  improvement in her preop symptoms. Eating a regular diet without difficulty. The patient is not having any pain.  Activity: normal activities of daily living. Denies fevers, chills, nausea, vomiting. Is voiding and having bowel movements without issues. Denies issues with incisions.   Objective: Vitals:   06/15/16 1347  BP: 118/70   Vital Signs: BP 118/70   Ht 5\' 1"  (1.549 m)   Wt 130 lb (59 kg)   BMI 24.56 kg/m  Constitutional: Well nourished, well developed female in no acute distress.  HEENT: normal Skin: Warm and dry.  Extremity: no edema  Abdomen: Soft, non-tender, normal bowel sounds; no bruits, organomegaly or masses. all incisions clean, dry, and intact.  No erythema, induration, warmth, or tenderness.    Assessment: 66 y.o. s/p laparoscopic left salpingo-oophorectomy  doing well  Plan: Patient has done well after surgery with no apparent complications.  I have discussed the post-operative course to date, and the expected progress moving forward.  The patient understands what complications to be concerned about.  I will see the patient in routine follow up, or sooner if needed.    Discussed pathology findings of serous cystadenofibroma and that this is a benign finding.   Activity plan: increase slowly as tolerated.  Prentice Docker 06/15/2016, 2:25 PM   CC: Wardell Honour, MD [address] [place], [place] [address]

## 2016-07-16 DIAGNOSIS — H2513 Age-related nuclear cataract, bilateral: Secondary | ICD-10-CM | POA: Diagnosis not present

## 2016-07-19 ENCOUNTER — Encounter: Payer: Self-pay | Admitting: Family Medicine

## 2016-07-19 DIAGNOSIS — Z1231 Encounter for screening mammogram for malignant neoplasm of breast: Secondary | ICD-10-CM | POA: Diagnosis not present

## 2016-07-19 DIAGNOSIS — Z803 Family history of malignant neoplasm of breast: Secondary | ICD-10-CM | POA: Diagnosis not present

## 2016-07-19 DIAGNOSIS — M810 Age-related osteoporosis without current pathological fracture: Secondary | ICD-10-CM | POA: Diagnosis not present

## 2016-07-19 DIAGNOSIS — M8589 Other specified disorders of bone density and structure, multiple sites: Secondary | ICD-10-CM | POA: Diagnosis not present

## 2016-07-30 ENCOUNTER — Telehealth: Payer: Self-pay | Admitting: Family Medicine

## 2016-07-30 NOTE — Telephone Encounter (Signed)
Pt is looking for the bone density test results she states that it has been two weeks now   Best number [phone]

## 2016-08-02 NOTE — Telephone Encounter (Signed)
I donot see this? Have you?

## 2016-08-09 MED ORDER — ALENDRONATE SODIUM 70 MG PO TABS: 70.0000 mg | ORAL_TABLET | ORAL | 11 refills | Status: DC

## 2016-08-09 NOTE — Addendum Note (Signed)
Addended by: Wardell Honour on: 08/09/2016 10:49 AM   Modules accepted: Orders

## 2016-08-25 ENCOUNTER — Encounter: Payer: Self-pay | Admitting: Family Medicine

## 2016-09-02 DIAGNOSIS — M5126 Other intervertebral disc displacement, lumbar region: Secondary | ICD-10-CM | POA: Diagnosis not present

## 2016-09-03 ENCOUNTER — Other Ambulatory Visit: Payer: Self-pay | Admitting: Neurological Surgery

## 2016-09-03 DIAGNOSIS — M5126 Other intervertebral disc displacement, lumbar region: Secondary | ICD-10-CM

## 2016-09-07 ENCOUNTER — Ambulatory Visit
Admission: RE | Admit: 2016-09-07 | Discharge: 2016-09-07 | Disposition: A | Discharge status: Home / Self Care | Payer: Medicare Other | Source: Ambulatory Visit | Attending: Neurological Surgery | Admitting: Neurological Surgery

## 2016-09-07 DIAGNOSIS — M5126 Other intervertebral disc displacement, lumbar region: Secondary | ICD-10-CM | POA: Diagnosis not present

## 2016-09-07 DIAGNOSIS — M5136 Other intervertebral disc degeneration, lumbar region: Secondary | ICD-10-CM | POA: Insufficient documentation

## 2016-09-07 DIAGNOSIS — M48061 Spinal stenosis, lumbar region without neurogenic claudication: Secondary | ICD-10-CM | POA: Insufficient documentation

## 2016-09-07 LAB — POCT I-STAT CREATININE: Creatinine, Ser: 0.6 mg/dL (ref 0.44–1.00)

## 2016-09-07 MED ORDER — GADOBENATE DIMEGLUMINE 529 MG/ML IV SOLN
10.0000 mL | Freq: Once | INTRAVENOUS | Status: AC | PRN
  Administered 2016-09-07: 10 mL via INTRAVENOUS

## 2016-10-06 DIAGNOSIS — M5126 Other intervertebral disc displacement, lumbar region: Secondary | ICD-10-CM | POA: Diagnosis not present

## 2016-10-18 DIAGNOSIS — M5116 Intervertebral disc disorders with radiculopathy, lumbar region: Secondary | ICD-10-CM | POA: Diagnosis not present

## 2016-10-18 DIAGNOSIS — Z9889 Other specified postprocedural states: Secondary | ICD-10-CM | POA: Diagnosis not present

## 2016-10-18 DIAGNOSIS — M5126 Other intervertebral disc displacement, lumbar region: Secondary | ICD-10-CM | POA: Diagnosis not present

## 2016-12-13 DIAGNOSIS — Z23 Encounter for immunization: Secondary | ICD-10-CM | POA: Diagnosis not present

## 2017-01-10 DIAGNOSIS — L570 Actinic keratosis: Secondary | ICD-10-CM | POA: Diagnosis not present

## 2017-01-10 DIAGNOSIS — L578 Other skin changes due to chronic exposure to nonionizing radiation: Secondary | ICD-10-CM | POA: Diagnosis not present

## 2017-01-10 DIAGNOSIS — L918 Other hypertrophic disorders of the skin: Secondary | ICD-10-CM | POA: Diagnosis not present

## 2017-01-10 DIAGNOSIS — L821 Other seborrheic keratosis: Secondary | ICD-10-CM | POA: Diagnosis not present

## 2017-01-10 DIAGNOSIS — L853 Xerosis cutis: Secondary | ICD-10-CM | POA: Diagnosis not present

## 2017-01-10 DIAGNOSIS — D1800 Hemangioma unspecified site: Secondary | ICD-10-CM | POA: Diagnosis not present

## 2017-02-14 ENCOUNTER — Ambulatory Visit: Payer: Medicare Other | Admitting: Primary Care

## 2017-06-06 ENCOUNTER — Telehealth: Payer: Self-pay | Admitting: Family Medicine

## 2017-06-06 NOTE — Telephone Encounter (Unsigned)
Copied from [place] [id number]. Topic: Quick Communication - Rx Refill/Question >> Jun 06, 2017  3:38 PM Carolyn Stare wrote: Medication  buPROPion (WELLBUTRIN XL) 300 MG 24 hr tablet   pt said she has been taking the 150 and that is what she prefers    Preferred Pharmacy    [facility]   Agent: Please be advised that RX refills may take up to 3 business days. We ask that you follow-up with your pharmacy.

## 2017-06-07 NOTE — Telephone Encounter (Signed)
Pt requesting bupropion. She has the 300 mg 24 hr tablet ordered. She is requesting a 150 mg tab.  Provider:  [name], MD  LOV  03/20/16 NOV  11/02/17  Pharm[facility] [place] [place]  Please review.

## 2017-06-07 NOTE — Telephone Encounter (Signed)
Pt has not been seen in over a year. Needs OV.

## 2017-06-08 NOTE — Telephone Encounter (Signed)
Called pt to let her know we would need to see her. She said that she had some of the 300 mg, she was just wanting the 150mg  so she could take 2 if needed. She said that she would be fine and would see Dr. Tamala Julian in August

## 2017-08-02 ENCOUNTER — Encounter: Payer: Self-pay | Admitting: Family Medicine

## 2017-08-16 DIAGNOSIS — L237 Allergic contact dermatitis due to plants, except food: Secondary | ICD-10-CM | POA: Diagnosis not present

## 2017-11-02 ENCOUNTER — Ambulatory Visit: Payer: Medicare Other | Admitting: Family Medicine

## 2017-12-09 DIAGNOSIS — Z23 Encounter for immunization: Secondary | ICD-10-CM | POA: Diagnosis not present

## 2017-12-13 ENCOUNTER — Telehealth: Payer: Self-pay | Admitting: Primary Care

## 2017-12-13 ENCOUNTER — Ambulatory Visit (INDEPENDENT_AMBULATORY_CARE_PROVIDER_SITE_OTHER): Payer: Medicare Other | Admitting: Primary Care

## 2017-12-13 ENCOUNTER — Encounter: Payer: Self-pay | Admitting: Primary Care

## 2017-12-13 VITALS — BP 134/80 | HR 71 | Temp 98.1°F | Ht 61.0 in | Wt 130.4 lb

## 2017-12-13 DIAGNOSIS — Z8249 Family history of ischemic heart disease and other diseases of the circulatory system: Secondary | ICD-10-CM

## 2017-12-13 DIAGNOSIS — Z1239 Encounter for other screening for malignant neoplasm of breast: Secondary | ICD-10-CM | POA: Diagnosis not present

## 2017-12-13 DIAGNOSIS — Z803 Family history of malignant neoplasm of breast: Secondary | ICD-10-CM

## 2017-12-13 DIAGNOSIS — F419 Anxiety disorder, unspecified: Secondary | ICD-10-CM

## 2017-12-13 DIAGNOSIS — F32A Depression, unspecified: Secondary | ICD-10-CM

## 2017-12-13 DIAGNOSIS — F329 Major depressive disorder, single episode, unspecified: Secondary | ICD-10-CM | POA: Diagnosis not present

## 2017-12-13 DIAGNOSIS — Z23 Encounter for immunization: Secondary | ICD-10-CM

## 2017-12-13 DIAGNOSIS — M797 Fibromyalgia: Secondary | ICD-10-CM

## 2017-12-13 DIAGNOSIS — F5104 Psychophysiologic insomnia: Secondary | ICD-10-CM

## 2017-12-13 LAB — LIPID PANEL
CHOL/HDL RATIO: 4
Cholesterol: 172 mg/dL (ref 0–200)
HDL: 44.9 mg/dL (ref >39.00)
LDL CALC: 107 mg/dL — AB (ref 0–99)
NONHDL: 127.33
Triglycerides: 102 mg/dL (ref 0.0–149.0)
VLDL: 20.4 mg/dL (ref 0.0–40.0)

## 2017-12-13 LAB — COMPREHENSIVE METABOLIC PANEL
ALBUMIN: 4.5 g/dL (ref 3.5–5.2)
ALT: 16 U/L (ref 0–35)
AST: 18 U/L (ref 0–37)
Alkaline Phosphatase: 39 U/L (ref 39–117)
BUN: 17 mg/dL (ref 6–23)
CHLORIDE: 102 meq/L (ref 96–112)
CO2: 32 meq/L (ref 19–32)
CREATININE: 0.6 mg/dL (ref 0.40–1.20)
Calcium: 10.2 mg/dL (ref 8.4–10.5)
GFR: 105.22 mL/min (ref >60.00)
GLUCOSE: 89 mg/dL (ref 70–99)
POTASSIUM: 3.6 meq/L (ref 3.5–5.1)
SODIUM: 140 meq/L (ref 135–145)
Total Bilirubin: 0.4 mg/dL (ref 0.2–1.2)
Total Protein: 7.4 g/dL (ref 6.0–8.3)

## 2017-12-13 MED ORDER — BUPROPION HCL ER (XL) 150 MG PO TB24: 150.0000 mg | ORAL_TABLET | Freq: Every day | ORAL | 0 refills | Status: DC

## 2017-12-13 MED ORDER — ZOSTER VAC RECOMB ADJUVANTED 50 MCG/0.5ML IM SUSR: 0.5000 mL | Freq: Once | INTRAMUSCULAR | 1 refills | Status: AC

## 2017-12-13 NOTE — Progress Notes (Signed)
Subjective:    Patient ID: Geet Hosking, female    DOB: 30-Nov-1950, 67 y.o.   MRN: [id number]  HPI  Ms. Revere is a 67 year old female who presents today to establish care and discuss the problems mentioned below. Will obtain old records.  1) Anxiety, Depression, Insomnia: Currently managed on Trazodone 50 mg, bupropion XL 300 mg. She mostly takes 150 mg of Wellbutrin daily but will increase to 300 mg as "needed". Her husband has a history of PTSD and so she finds that she needs extra doses during increased periods of stress.   2) Osteoporosis: Diagnosed in May 2018 and prescribed alendronate 70 mg weekly. She took one dose which caused vomiting so she's taking calcium and vitamin tablets and increasing intake of dietary calcium. She is interested in Prolia injections.   3) Fibromyalgia/Chronic Back Pain: Currently managed on diclofenac 1% gel for which she uses intermittently to her lower back. She experiences pain to the joints of her back, shoulders. She does wake up in the morning with lower extremity cramping. She does follow with rheumatology.    Review of Systems  Respiratory: Negative for shortness of breath.   Cardiovascular: Negative for chest pain.  Musculoskeletal:       Chronic myalgias and arthralgias   Psychiatric/Behavioral: Negative for sleep disturbance.       Feels well managed on Wellbutrin        Past Medical History:  Diagnosis Date  . Achilles bursitis or tendinitis   . Anxiety state, unspecified   . Arthritis   . Closed fracture of unspecified part of fibula   . Depressive disorder, not elsewhere classified   . Diverticulosis of colon (without mention of hemorrhage)   . Edema   . Fibromyalgia   . Insomnia, unspecified   . Lumbago   . Meniere's disease 1986  . MVP (mitral valve prolapse)   . Pain in joint, site unspecified   . Peptic ulcer    no testing  . Pure hypercholesterolemia   . TSH elevation      Social History   Socioeconomic  History  . Marital status: Married    Spouse name: Caelin Rosen  . Number of children: 2  . Years of education: Not on file  . Highest education level: Not on file  Occupational History  . Occupation: retired    Comment: Network engineer  Social Needs  . Financial resource strain: Not on file  . Food insecurity:    Worry: Not on file    Inability: Not on file  . Transportation needs:    Medical: Not on file    Non-medical: Not on file  Tobacco Use  . Smoking status: Never Smoker  . Smokeless tobacco: Never Used  Substance and Sexual Activity  . Alcohol use: Yes    Alcohol/week: 0.0 standard drinks    Comment: 3-4 beers per week or wine  . Drug use: No  . Sexual activity: Yes    Birth control/protection: Surgical  Lifestyle  . Physical activity:    Days per week: Not on file    Minutes per session: Not on file  . Stress: Not on file  Relationships  . Social connections:    Talks on phone: Not on file    Gets together: Not on file    Attends religious service: Not on file    Active member of club or organization: Not on file    Attends meetings of clubs or organizations: Not on file  Relationship status: Not on file  . Intimate partner violence:    Fear of current or ex partner: Not on file    Emotionally abused: Not on file    Physically abused: Not on file    Forced sexual activity: Not on file  Other Topics Concern  . Not on file  Social History Narrative   Marital status:  Married x 14 years; second marriage;happily married,husband has PTSD.     Children: 2 daughters [name], [name]); 7 grandchildren; 2 gg      Lives: with husband, dog/beagle      Employment: retired since 2013; Network engineer      Tobacco: never      Alcohol: weekends; 1-2 glasses of wine per week or beer.      Drugs:  None      Exercise: none in 2018      Seatbelt: 100%      Guns: unloaded.     Caffeine not every day. 7 grandchildren and 2 GG. Organ donor NO.      ADLs: independent with ADLs; drives        Advanced Directives: YES; FULL CODE no prolonged measures.    Past Surgical History:  Procedure Laterality Date  . ABDOMINAL HYSTERECTOMY  03/08/1980   fibroids; ovaries intact.  Marland Kitchen BACK SURGERY     x2 Cervical and lumbar  . CYSTOSCOPY N/A 06/08/2016   Procedure: CYSTOSCOPY;  Surgeon: Will Bonnet, MD;  Location: [facility] OR[facility];  Service: Gynecology;  Laterality: N/A;  . LAPAROSCOPIC LYSIS OF ADHESIONS  06/08/2016   Procedure: LAPAROSCOPIC LYSIS OF ADHESIONS;  Surgeon: Will Bonnet, MD;  Location: [facility] OR[facility];  Service: Gynecology;;  . LAPAROSCOPIC SALPINGO OOPHERECTOMY Left 06/08/2016   Procedure: LAPAROSCOPIC SALPINGO OOPHORECTOMY;  Surgeon: Will Bonnet, MD;  Location: [facility] OR[facility];  Service: Gynecology;  Laterality: Left;  . OOPHORECTOMY     left ovary  . SALPINGECTOMY     left  . SPINE SURGERY     lumbar spine 11/2015; cervical spine 2009. [facility].    Family History  Problem Relation Age of Onset  . Hyperlipidemia Mother   . Hypertension Mother   . Colon polyps Mother   . Breast cancer Sister 55       with metastasis to bone; smoker  . Cancer Sister 51       breast cancer  . Coronary artery disease Father         AMI age 50  . Heart disease Father 41       AMI x 2; first AMI in 82s  . Stroke Maternal Grandmother   . Heart disease Paternal Grandfather   . Heart attack Paternal Grandfather   . Colon cancer Maternal Uncle        dx. >50  . Colon cancer Paternal Aunt        dx. 72s  . Heart attack Paternal Uncle   . Alzheimer's disease Paternal Grandmother   . Breast cancer Cousin   . Ovarian cancer Cousin        dx. late 70s/dx. 60s  . Lung cancer Cousin        dx. 43s  . Stomach cancer Other     Allergies  Allergen Reactions  . Demerol [Meperidine] Nausea And Vomiting  . Kenalog [Triamcinolone Acetonide] Other (See Comments)    Cause vertigo/dizziness  . Adhesive [Tape] Rash and Other (See Comments)    PAPER TAPE OK    Current Outpatient Medications on  File Prior to Visit  Medication Sig Dispense Refill  . Calcium Citrate (CITRACAL PO) Take 2 tablets by mouth 2 (two) times daily.     . Cholecalciferol (VITAMIN D-1000 MAX ST) 1000 units tablet Take by mouth.    . diclofenac sodium (VOLTAREN) 1 % GEL Apply topically.    . fluticasone (FLONASE) 50 MCG/ACT nasal spray Place 2 sprays into the nose daily as needed for allergies.     Marland Kitchen ibuprofen (ADVIL,MOTRIN) 600 MG tablet Take 1 tablet (600 mg total) by mouth every 6 (six) hours as needed for mild pain or cramping. 30 tablet 0  . Multiple Vitamins-Minerals (MULTIVITAMIN WITH MINERALS) tablet Take 1 tablet by mouth daily. Centrum Silver for Women    . Omega-3 Fatty Acids (FISH OIL) 500 MG CAPS Take 500 mg by mouth daily.    . traZODone (DESYREL) 50 MG tablet Take 50-100 mg by mouth at bedtime as needed. for sleep  3   No current facility-administered medications on file prior to visit.     BP 134/80 (BP Location: Right Arm, Patient Position: Sitting, Cuff Size: Normal)   Pulse 71   Temp 98.1 F (36.7 C) (Oral)   Ht 5\' 1"  (1.549 m)   Wt 130 lb 6.4 oz (59.1 kg)   SpO2 96%   BMI 24.64 kg/m    Objective:   Physical Exam  Constitutional: She appears well-nourished.  Neck: Neck supple.  Cardiovascular: Normal rate and regular rhythm.  Respiratory: Effort normal and breath sounds normal.  Skin: Skin is warm and dry.  Psychiatric: She has a normal mood and affect.           Assessment & Plan:

## 2017-12-13 NOTE — Assessment & Plan Note (Signed)
Doing well on Trazodone, continue same. 

## 2017-12-13 NOTE — Assessment & Plan Note (Signed)
Chronic, following with rheumatology.

## 2017-12-13 NOTE — Patient Instructions (Addendum)
Increase intake of fiber to 25-30 grams daily. This can be in the form of pills, food, etc. Look around at the pharmacy.   Call the [facility] to schedule your mammogram.   We will be in touch regarding Prolia treatment.   Stop by the lab prior to leaving today. I will notify you of your results once received.   It was a pleasure to meet you today! Please don't hesitate to call or message me with any questions. Welcome to [facility]!

## 2017-12-13 NOTE — Assessment & Plan Note (Signed)
Repeat mammogram pending.

## 2017-12-13 NOTE — Assessment & Plan Note (Signed)
Doing well on Wellbutrin 150 mg dose, refill sent to pharmacy. Okay to use extra tablet when needed as she's done well on this in the past.

## 2017-12-13 NOTE — Telephone Encounter (Signed)
Patient would like to start on Prolia for osteoporosis. Bone density scan from May 2018 in chart. Calcium level pending. Will you please help patient get connected? Thanks!

## 2017-12-15 NOTE — Telephone Encounter (Signed)
Information has been submitted to pts insurance for verification of benefits. Awaiting response for coverage  

## 2018-01-09 DIAGNOSIS — Z1231 Encounter for screening mammogram for malignant neoplasm of breast: Secondary | ICD-10-CM | POA: Diagnosis not present

## 2018-01-09 DIAGNOSIS — Z803 Family history of malignant neoplasm of breast: Secondary | ICD-10-CM | POA: Diagnosis not present

## 2018-01-09 LAB — HM MAMMOGRAPHY

## 2018-01-10 ENCOUNTER — Encounter: Payer: Self-pay | Admitting: Primary Care

## 2018-01-16 DIAGNOSIS — Z872 Personal history of diseases of the skin and subcutaneous tissue: Secondary | ICD-10-CM | POA: Diagnosis not present

## 2018-01-16 DIAGNOSIS — Z1283 Encounter for screening for malignant neoplasm of skin: Secondary | ICD-10-CM | POA: Diagnosis not present

## 2018-01-16 DIAGNOSIS — L720 Epidermal cyst: Secondary | ICD-10-CM | POA: Diagnosis not present

## 2018-01-16 DIAGNOSIS — B372 Candidiasis of skin and nail: Secondary | ICD-10-CM | POA: Diagnosis not present

## 2018-01-16 DIAGNOSIS — L570 Actinic keratosis: Secondary | ICD-10-CM | POA: Diagnosis not present

## 2018-01-16 DIAGNOSIS — L821 Other seborrheic keratosis: Secondary | ICD-10-CM | POA: Diagnosis not present

## 2018-01-16 DIAGNOSIS — L578 Other skin changes due to chronic exposure to nonionizing radiation: Secondary | ICD-10-CM | POA: Diagnosis not present

## 2018-01-25 ENCOUNTER — Other Ambulatory Visit: Payer: Self-pay

## 2018-01-31 NOTE — Telephone Encounter (Signed)
Verification of benefits have been processed and an approval has been received for pts prolia injection. Pts estimated cost are appx $250. This is only an estimate and cannot be confirmed until benefits are paid. Please advise pt and schedule if needed. If scheduled, once the injection is received, pls contact me back with the date it was received so that I am able to update prolia folder. thanks  

## 2018-01-31 NOTE — Telephone Encounter (Signed)
Noted  

## 2018-01-31 NOTE — Telephone Encounter (Signed)
Spoke to pt who states she will contact office back with a decision once she has had a chance to speak with her husband regarding the costs.

## 2018-03-06 ENCOUNTER — Other Ambulatory Visit: Payer: Self-pay | Admitting: Primary Care

## 2018-03-06 DIAGNOSIS — F329 Major depressive disorder, single episode, unspecified: Secondary | ICD-10-CM

## 2018-03-06 DIAGNOSIS — F419 Anxiety disorder, unspecified: Principal | ICD-10-CM

## 2018-03-06 DIAGNOSIS — F32A Depression, unspecified: Secondary | ICD-10-CM

## 2018-04-13 ENCOUNTER — Ambulatory Visit (INDEPENDENT_AMBULATORY_CARE_PROVIDER_SITE_OTHER): Payer: Medicare Other | Admitting: Family Medicine

## 2018-04-13 ENCOUNTER — Encounter: Payer: Self-pay | Admitting: Family Medicine

## 2018-04-13 VITALS — BP 114/78 | HR 77 | Temp 98.1°F | Ht 61.0 in

## 2018-04-13 DIAGNOSIS — R21 Rash and other nonspecific skin eruption: Secondary | ICD-10-CM | POA: Diagnosis not present

## 2018-04-13 MED ORDER — HYDROXYZINE HCL 10 MG PO TABS: 10.0000 mg | ORAL_TABLET | Freq: Three times a day (TID) | ORAL | 0 refills | Status: DC | PRN

## 2018-04-13 MED ORDER — PREDNISONE 20 MG PO TABS: ORAL_TABLET | ORAL | 0 refills | Status: DC

## 2018-04-13 NOTE — Patient Instructions (Signed)
Stay cool  Avoid harsh chemicals/hot conditions  Keep nails short   Continue scent free products   Take prednisone as directed  Hydroxyzine as needed for itch with caution of sedation

## 2018-04-13 NOTE — Progress Notes (Signed)
Subjective:    Patient ID: Tracy Estrada, female    DOB: [birth year], 68 y.o.   MRN: [id number]  HPI 68 yo pt of NP Clark with rash   Saturday started on L hand   Has itched everywhere except face (except scalp)   Whelps all over   No new foods No new medicines  No new otc things   Was on a trip and she used a hotel soap last week  No concern of bed bugs (husband is not itching and has no rash)   No swelling in throat or mouth No swelling of eyes No wheezing   Soap-dove for sens  No perfumes  Detergent- all free and clear   Never had hives before   Patient Active Problem List   Diagnosis Date Noted  . Rash and nonspecific skin eruption 04/13/2018  . Other intra-abdominal and pelvic swelling, mass and lump 06/01/2016  . Mass of left ovary 06/01/2016  . Fibromyalgia 09/16/2015  . Anxiety and depression 09/16/2015  . Insomnia 09/16/2015  . Genetic testing 03/21/2015  . Family history of breast cancer in sister 02/27/2015  . Family history of ovarian cancer 02/27/2015  . Family history of colon cancer 02/27/2015   Past Medical History:  Diagnosis Date  . Achilles bursitis or tendinitis   . Anxiety state, unspecified   . Arthritis   . Closed fracture of unspecified part of fibula   . Depressive disorder, not elsewhere classified   . Diverticulosis of colon (without mention of hemorrhage)   . Edema   . Fibromyalgia   . Insomnia, unspecified   . Lumbago   . Meniere's disease 1986  . MVP (mitral valve prolapse)   . Pain in joint, site unspecified   . Peptic ulcer    no testing  . Pure hypercholesterolemia   . TSH elevation    Past Surgical History:  Procedure Laterality Date  . ABDOMINAL HYSTERECTOMY  03/08/1980   fibroids; ovaries intact.  Marland Kitchen BACK SURGERY     x2 Cervical and lumbar  . CYSTOSCOPY N/A 06/08/2016   Procedure: CYSTOSCOPY;  Surgeon: Will Bonnet, MD;  Location: [facility] ORS;  Service: Gynecology;  Laterality: N/A;  . LAPAROSCOPIC LYSIS OF  ADHESIONS  06/08/2016   Procedure: LAPAROSCOPIC LYSIS OF ADHESIONS;  Surgeon: Will Bonnet, MD;  Location: [facility] OR[facility];  Service: Gynecology;;  . LAPAROSCOPIC SALPINGO OOPHERECTOMY Left 06/08/2016   Procedure: LAPAROSCOPIC SALPINGO OOPHORECTOMY;  Surgeon: Will Bonnet, MD;  Location: [facility] OR[facility];  Service: Gynecology;  Laterality: Left;  . OOPHORECTOMY     left ovary  . SALPINGECTOMY     left  . SPINE SURGERY     lumbar spine 11/2015; cervical spine 2009. [facility].   Social History   Tobacco Use  . Smoking status: Never Smoker  . Smokeless tobacco: Never Used  Substance Use Topics  . Alcohol use: Yes    Alcohol/week: 0.0 standard drinks    Comment: 3-4 beers per week or wine  . Drug use: No   Family History  Problem Relation Age of Onset  . Hyperlipidemia Mother   . Hypertension Mother   . Colon polyps Mother   . Breast cancer Sister 63       with metastasis to bone; smoker  . Cancer Sister 13       breast cancer  . Coronary artery disease Father         AMI age 75  . Heart disease Father 75  AMI x 2; first AMI in 46s  . Stroke Maternal Grandmother   . Heart disease Paternal Grandfather   . Heart attack Paternal Grandfather   . Colon cancer Maternal Uncle        dx. >50  . Colon cancer Paternal Aunt        dx. 26s  . Heart attack Paternal Uncle   . Alzheimer's disease Paternal Grandmother   . Breast cancer Cousin   . Ovarian cancer Cousin        dx. late 70s/dx. 60s  . Lung cancer Cousin        dx. 13s  . Stomach cancer Other    Allergies  Allergen Reactions  . Demerol [Meperidine] Nausea And Vomiting  . Kenalog [Triamcinolone Acetonide] Other (See Comments)    Cause vertigo/dizziness  . Adhesive [Tape] Rash and Other (See Comments)    PAPER TAPE OK   Current Outpatient Medications on File Prior to Visit  Medication Sig Dispense Refill  . acetaminophen (TYLENOL) 325 MG tablet Take 650 mg by mouth every 6 (six) hours as needed.    Marland Kitchen buPROPion (WELLBUTRIN  XL) 150 MG 24 hr tablet TAKE 1-2 TABLETS (150-300 MG TOTAL) BY MOUTH DAILY. 180 tablet 1  . Calcium Citrate (CITRACAL PO) Take 2 tablets by mouth 2 (two) times daily.     . Cholecalciferol (VITAMIN D-1000 MAX ST) 1000 units tablet Take by mouth.    . diclofenac sodium (VOLTAREN) 1 % GEL Apply topically.    . fluticasone (FLONASE) 50 MCG/ACT nasal spray Place 2 sprays into the nose daily as needed for allergies.     . Multiple Vitamins-Minerals (MULTIVITAMIN WITH MINERALS) tablet Take 1 tablet by mouth daily. Centrum Silver for Women    . Omega-3 Fatty Acids (FISH OIL) 500 MG CAPS Take 500 mg by mouth daily.    . traZODone (DESYREL) 50 MG tablet Take 50-100 mg by mouth at bedtime as needed. for sleep  3   No current facility-administered medications on file prior to visit.       Review of Systems  Constitutional: Negative for activity change, appetite change, fatigue, fever and unexpected weight change.  HENT: Negative for congestion, ear pain, facial swelling, rhinorrhea, sinus pressure and sore throat.   Eyes: Negative for pain, redness, itching and visual disturbance.       No eye swelling   Respiratory: Negative for cough, shortness of breath and wheezing.   Cardiovascular: Negative for chest pain and palpitations.  Gastrointestinal: Negative for abdominal pain, blood in stool, constipation and diarrhea.  Endocrine: Negative for polydipsia and polyuria.  Genitourinary: Negative for dysuria, frequency and urgency.  Musculoskeletal: Negative for arthralgias, back pain and myalgias.  Skin: Positive for rash. Negative for pallor.       Rash with itching   Allergic/Immunologic: Negative for environmental allergies.  Neurological: Negative for dizziness, syncope and headaches.  Hematological: Negative for adenopathy. Does not bruise/bleed easily.  Psychiatric/Behavioral: Negative for decreased concentration and dysphoric mood. The patient is not nervous/anxious.        Objective:    Physical Exam Constitutional:      General: She is not in acute distress.    Appearance: Normal appearance. She is normal weight. She is not ill-appearing.  HENT:     Head: Normocephalic and atraumatic.     Comments: No facial swelling  No rash on face    Nose: Nose normal. No rhinorrhea.     Mouth/Throat:     Mouth: Mucous membranes  are moist.     Pharynx: Oropharynx is clear.     Comments: No swelling of throat or mouth Eyes:     General: No scleral icterus.       Right eye: No discharge.        Left eye: No discharge.     Conjunctiva/sclera: Conjunctivae normal.     Pupils: Pupils are equal, round, and reactive to light.  Neck:     Musculoskeletal: Normal range of motion.  Cardiovascular:     Rate and Rhythm: Normal rate and regular rhythm.     Heart sounds: Normal heart sounds.  Pulmonary:     Effort: Pulmonary effort is normal. No respiratory distress.     Breath sounds: Normal breath sounds. No stridor. No wheezing.  Lymphadenopathy:     Cervical: No cervical adenopathy.  Skin:    General: Skin is warm and dry.     Findings: Rash present.     Comments: Diffuse rash sparing head  Whelps of different sizes/some confluent and they do not blanche  ? Few healed vesicles on fingers  Rash does include palms and soles Worst on back  No excoriations    Neurological:     Mental Status: She is alert.     Cranial Nerves: No cranial nerve deficit.     Coordination: Coordination normal.     Deep Tendon Reflexes: Reflexes normal.  Psychiatric:        Mood and Affect: Mood normal.           Assessment & Plan:   Problem List Items Addressed This Visit      Musculoskeletal and Integument   Rash and nonspecific skin eruption - Primary    Rash resembles whelps - suspect allergic rxn (no angioedema) Pt states she had once before Px hydroxyzine for itch with caution of sedation  Prednisone taper Stay cool/avoid colors and fragrances Consider allergy eval if no imp or  recurrent  Meds ordered this encounter  Medications  . predniSONE (DELTASONE) 20 MG tablet    Sig: Take 3 pills by mouth once daily for 3 days, then 2 pills daily for 3 days then 1 pill daily for one day then stop    Dispense:  18 tablet    Refill:  0  . hydrOXYzine (ATARAX/VISTARIL) 10 MG tablet    Sig: Take 1 tablet (10 mg total) by mouth 3 (three) times daily as needed for itching. Caution of sedation    Dispense:  30 tablet    Refill:  0

## 2018-04-13 NOTE — Assessment & Plan Note (Signed)
Rash resembles whelps - suspect allergic rxn (no angioedema) Pt states she had once before Px hydroxyzine for itch with caution of sedation  Prednisone taper Stay cool/avoid colors and fragrances Consider allergy eval if no imp or recurrent  Meds ordered this encounter  Medications  . predniSONE (DELTASONE) 20 MG tablet    Sig: Take 3 pills by mouth once daily for 3 days, then 2 pills daily for 3 days then 1 pill daily for one day then stop    Dispense:  18 tablet    Refill:  0  . hydrOXYzine (ATARAX/VISTARIL) 10 MG tablet    Sig: Take 1 tablet (10 mg total) by mouth 3 (three) times daily as needed for itching. Caution of sedation    Dispense:  30 tablet    Refill:  0

## 2018-07-14 ENCOUNTER — Telehealth: Payer: Self-pay | Admitting: Primary Care

## 2018-07-14 NOTE — Telephone Encounter (Signed)
Noted. Please have her remind me, she's tried Fosamax, what reaction did she have? Has she tried the once monthly Boniva? Also, make sure she is taking calcium 1200 mg and vitmain D daily.

## 2018-07-14 NOTE — Telephone Encounter (Signed)
Noted  

## 2018-07-14 NOTE — Telephone Encounter (Signed)
Spoken to patient, she stated that yes, she did try Fosamax and the reaction was nausea/vomit. She did try Boniva but can not remember much about it because it was a long time ago. Yes, patient is taking calcium and vitamin D. Patient want to do Prolia but not now. She will call back when she is ready.

## 2018-07-14 NOTE — Telephone Encounter (Signed)
Spoke w/pt. She has discussed w/her husband and she would like to do Prolia but not at this time.  She has been anywhere since the start of the Covid virus and doesn't want to go out anytime soon.  She thinks she may need to have back surgery and wants to stay safe should she need to do surgery. Pt will c/b when she decides she wants to start Prolia.

## 2018-09-05 ENCOUNTER — Other Ambulatory Visit: Payer: Self-pay | Admitting: Primary Care

## 2018-09-05 DIAGNOSIS — F32A Depression, unspecified: Secondary | ICD-10-CM

## 2018-09-05 DIAGNOSIS — F329 Major depressive disorder, single episode, unspecified: Secondary | ICD-10-CM

## 2018-09-05 DIAGNOSIS — F419 Anxiety disorder, unspecified: Secondary | ICD-10-CM

## 2018-10-13 ENCOUNTER — Telehealth: Payer: Self-pay | Admitting: Primary Care

## 2018-10-13 NOTE — Telephone Encounter (Signed)
Called to check w/pt to see if she is ready to restart Prolia.  Pt states she is busy and can't talk right now.  She will c/b.

## 2018-11-24 ENCOUNTER — Telehealth: Payer: Self-pay | Admitting: Primary Care

## 2018-11-24 NOTE — Telephone Encounter (Signed)
Prolia benefit verification request sent to [employer] for Prolia.

## 2018-12-09 DIAGNOSIS — Z23 Encounter for immunization: Secondary | ICD-10-CM | POA: Diagnosis not present

## 2018-12-29 ENCOUNTER — Telehealth: Payer: Self-pay

## 2018-12-29 NOTE — Telephone Encounter (Signed)
Discussed Prolia benefits w/pt.  Pt would owe approximately $0.  Pt understands and agrees.  She wants to discuss Prolia@her  annual OV 01-25-2019.  Labs to be done 01-17-2019.  Will see what calcium level is then.

## 2019-01-15 ENCOUNTER — Other Ambulatory Visit: Payer: Self-pay | Admitting: Primary Care

## 2019-01-15 ENCOUNTER — Encounter: Payer: Self-pay | Admitting: Primary Care

## 2019-01-15 DIAGNOSIS — Z90710 Acquired absence of both cervix and uterus: Secondary | ICD-10-CM | POA: Diagnosis not present

## 2019-01-15 DIAGNOSIS — Z0000 Encounter for general adult medical examination without abnormal findings: Secondary | ICD-10-CM

## 2019-01-15 DIAGNOSIS — Z1231 Encounter for screening mammogram for malignant neoplasm of breast: Secondary | ICD-10-CM | POA: Diagnosis not present

## 2019-01-15 DIAGNOSIS — M810 Age-related osteoporosis without current pathological fracture: Secondary | ICD-10-CM | POA: Diagnosis not present

## 2019-01-15 DIAGNOSIS — Z803 Family history of malignant neoplasm of breast: Secondary | ICD-10-CM | POA: Diagnosis not present

## 2019-01-15 LAB — HM MAMMOGRAPHY

## 2019-01-16 ENCOUNTER — Telehealth: Payer: Self-pay

## 2019-01-16 DIAGNOSIS — M2550 Pain in unspecified joint: Secondary | ICD-10-CM

## 2019-01-16 NOTE — Addendum Note (Signed)
Addended by: [name] on: 01/16/2019 03:50 PM   Modules accepted: Orders

## 2019-01-16 NOTE — Telephone Encounter (Signed)
Spoken and notified patient of Kate Clark's comments. Patient verbalized understanding.  

## 2019-01-16 NOTE — Telephone Encounter (Signed)
Please notify patient that I ordered labs per her request. We can discuss her symptoms in greater detail next week.

## 2019-01-16 NOTE — Telephone Encounter (Signed)
Patient contacted our office saying that she has a lab appt scheduled tomorrow and a CPE next week with [name], and she is wanting to be tested for rheumatoid arthritis. Patient states that has a diagnoses of fibromyalgia which causes a lot of her pain, but she is wondering if she has RA because her joints and body feel very stiff and achey in the mornings - and she would like to be tested prior to her CPE.  [name], please advise.

## 2019-01-17 ENCOUNTER — Other Ambulatory Visit (INDEPENDENT_AMBULATORY_CARE_PROVIDER_SITE_OTHER): Payer: Medicare Other

## 2019-01-17 ENCOUNTER — Encounter: Payer: Self-pay | Admitting: Primary Care

## 2019-01-17 DIAGNOSIS — Z0000 Encounter for general adult medical examination without abnormal findings: Secondary | ICD-10-CM | POA: Diagnosis not present

## 2019-01-17 DIAGNOSIS — M2550 Pain in unspecified joint: Secondary | ICD-10-CM | POA: Diagnosis not present

## 2019-01-17 LAB — COMPREHENSIVE METABOLIC PANEL
ALT: 13 U/L (ref 0–35)
AST: 15 U/L (ref 0–37)
Albumin: 4.5 g/dL (ref 3.5–5.2)
Alkaline Phosphatase: 48 U/L (ref 39–117)
BUN: 20 mg/dL (ref 6–23)
CO2: 33 mEq/L — ABNORMAL HIGH (ref 19–32)
Calcium: 9.8 mg/dL (ref 8.4–10.5)
Chloride: 103 mEq/L (ref 96–112)
Creatinine, Ser: 0.76 mg/dL (ref 0.40–1.20)
GFR: 75.12 mL/min (ref >60.00)
Glucose, Bld: 89 mg/dL (ref 70–99)
Potassium: 3.7 mEq/L (ref 3.5–5.1)
Sodium: 140 mEq/L (ref 135–145)
Total Bilirubin: 0.6 mg/dL (ref 0.2–1.2)
Total Protein: 7.1 g/dL (ref 6.0–8.3)

## 2019-01-17 LAB — LIPID PANEL
Cholesterol: 210 mg/dL — ABNORMAL HIGH (ref 0–200)
HDL: 53.1 mg/dL (ref >39.00)
LDL Cholesterol: 137 mg/dL — ABNORMAL HIGH (ref 0–99)
NonHDL: 157.03
Total CHOL/HDL Ratio: 4
Triglycerides: 101 mg/dL (ref 0.0–149.0)
VLDL: 20.2 mg/dL (ref 0.0–40.0)

## 2019-01-17 LAB — CBC
HCT: 40 % (ref 36.0–46.0)
Hemoglobin: 13.7 g/dL (ref 12.0–15.0)
MCHC: 34.1 g/dL (ref 30.0–36.0)
MCV: 99.8 fl (ref 78.0–100.0)
Platelets: 191 10*3/uL (ref 150.0–400.0)
RBC: 4.01 Mil/uL (ref 3.87–5.11)
RDW: 12.3 % (ref 11.5–15.5)
WBC: 3.9 10*3/uL — ABNORMAL LOW (ref 4.0–10.5)

## 2019-01-17 LAB — SEDIMENTATION RATE: Sed Rate: 5 mm/hr (ref 0–30)

## 2019-01-17 LAB — C-REACTIVE PROTEIN: CRP: <1 mg/dL (ref 0.5–20.0)

## 2019-01-18 DIAGNOSIS — L918 Other hypertrophic disorders of the skin: Secondary | ICD-10-CM | POA: Diagnosis not present

## 2019-01-18 DIAGNOSIS — Z872 Personal history of diseases of the skin and subcutaneous tissue: Secondary | ICD-10-CM | POA: Diagnosis not present

## 2019-01-18 DIAGNOSIS — D1801 Hemangioma of skin and subcutaneous tissue: Secondary | ICD-10-CM | POA: Diagnosis not present

## 2019-01-18 DIAGNOSIS — L821 Other seborrheic keratosis: Secondary | ICD-10-CM | POA: Diagnosis not present

## 2019-01-18 DIAGNOSIS — L720 Epidermal cyst: Secondary | ICD-10-CM | POA: Diagnosis not present

## 2019-01-18 DIAGNOSIS — L578 Other skin changes due to chronic exposure to nonionizing radiation: Secondary | ICD-10-CM | POA: Diagnosis not present

## 2019-01-18 DIAGNOSIS — L570 Actinic keratosis: Secondary | ICD-10-CM | POA: Diagnosis not present

## 2019-01-18 LAB — RHEUMATOID FACTOR: Rheumatoid fact SerPl-aCnc: <14 IU/mL (ref <14)

## 2019-01-18 LAB — CYCLIC CITRUL PEPTIDE ANTIBODY, IGG: Cyclic Citrullin Peptide Ab: <16 UNITS

## 2019-01-25 ENCOUNTER — Telehealth: Payer: Self-pay

## 2019-01-25 ENCOUNTER — Encounter: Payer: Self-pay | Admitting: Primary Care

## 2019-01-25 ENCOUNTER — Other Ambulatory Visit: Payer: Self-pay

## 2019-01-25 ENCOUNTER — Ambulatory Visit (INDEPENDENT_AMBULATORY_CARE_PROVIDER_SITE_OTHER): Payer: Medicare Other | Admitting: Primary Care

## 2019-01-25 VITALS — BP 120/82 | HR 72 | Temp 97.9°F | Ht 61.0 in | Wt 129.0 lb

## 2019-01-25 DIAGNOSIS — Z0000 Encounter for general adult medical examination without abnormal findings: Secondary | ICD-10-CM | POA: Diagnosis not present

## 2019-01-25 DIAGNOSIS — F419 Anxiety disorder, unspecified: Secondary | ICD-10-CM | POA: Diagnosis not present

## 2019-01-25 DIAGNOSIS — F32A Depression, unspecified: Secondary | ICD-10-CM

## 2019-01-25 DIAGNOSIS — M797 Fibromyalgia: Secondary | ICD-10-CM

## 2019-01-25 DIAGNOSIS — F329 Major depressive disorder, single episode, unspecified: Secondary | ICD-10-CM | POA: Diagnosis not present

## 2019-01-25 DIAGNOSIS — F5104 Psychophysiologic insomnia: Secondary | ICD-10-CM

## 2019-01-25 DIAGNOSIS — M810 Age-related osteoporosis without current pathological fracture: Secondary | ICD-10-CM | POA: Insufficient documentation

## 2019-01-25 DIAGNOSIS — E785 Hyperlipidemia, unspecified: Secondary | ICD-10-CM | POA: Diagnosis not present

## 2019-01-25 MED ORDER — ROSUVASTATIN CALCIUM 5 MG PO TABS: 5.0000 mg | ORAL_TABLET | Freq: Every evening | ORAL | 3 refills | Status: DC

## 2019-01-25 NOTE — Addendum Note (Signed)
Addended by: Jacqualin Combes on: 01/25/2019 12:24 PM   Modules accepted: Orders

## 2019-01-25 NOTE — Assessment & Plan Note (Signed)
Recent LDL of 137 with ASCVD risk score of 8.6%. Discussed risks for CVD and stroke, she would like to proceed with statin therapy.  Rx for Crestor 5 mg sent to pharmacy. Repeat lipids and LFT's in 6 weeks.

## 2019-01-25 NOTE — Telephone Encounter (Signed)
LMOM.  Already discussed Prolia benefits.  Ok to schedule pt for Prolia injection when pt calls back.

## 2019-01-25 NOTE — Assessment & Plan Note (Signed)
Doing well on Wellbutrin XL, denies SI/HI.  Continue same.

## 2019-01-25 NOTE — Progress Notes (Addendum)
Patient ID: Tracy Estrada, female   DOB: 05-23-1950, 68 y.o.   MRN: [id number]  HPI: Tracy Estrada is a 68 year old female who presents today for [place] and follow up of chronic conditions.  Past Medical History:  Diagnosis Date  . Achilles bursitis or tendinitis   . Anxiety state, unspecified   . Arthritis   . Closed fracture of unspecified part of fibula   . Depressive disorder, not elsewhere classified   . Diverticulosis of colon (without mention of hemorrhage)   . Edema   . Fibromyalgia   . Insomnia, unspecified   . Lumbago   . Meniere's disease 1986  . MVP (mitral valve prolapse)   . Pain in joint, site unspecified   . Peptic ulcer    no testing  . Pure hypercholesterolemia   . TSH elevation     Current Outpatient Medications  Medication Sig Dispense Refill  . acetaminophen (TYLENOL) 325 MG tablet Take 650 mg by mouth every 6 (six) hours as needed.    Marland Kitchen buPROPion (WELLBUTRIN XL) 150 MG 24 hr tablet TAKE 1-2 TABLETS (150-300 MG TOTAL) BY MOUTH DAILY. 180 tablet 1  . Calcium Citrate (CITRACAL PO) Take 2 tablets by mouth 2 (two) times daily.     . Cholecalciferol (VITAMIN D-1000 MAX ST) 1000 units tablet Take by mouth.    . diclofenac sodium (VOLTAREN) 1 % GEL Apply topically.    . fluticasone (FLONASE) 50 MCG/ACT nasal spray Place 2 sprays into the nose daily as needed for allergies.     . hydrOXYzine (ATARAX/VISTARIL) 10 MG tablet Take 1 tablet (10 mg total) by mouth 3 (three) times daily as needed for itching. Caution of sedation 30 tablet 0  . Multiple Vitamins-Minerals (MULTIVITAMIN WITH MINERALS) tablet Take 1 tablet by mouth daily. Centrum Silver for Women    . Omega-3 Fatty Acids (FISH OIL) 500 MG CAPS Take 500 mg by mouth daily.    . traZODone (DESYREL) 50 MG tablet Take 50-100 mg by mouth at bedtime as needed. for sleep  3   No current facility-administered medications for this visit.     Allergies  Allergen Reactions  . Demerol [Meperidine] Nausea And Vomiting  .  Kenalog [Triamcinolone Acetonide] Other (See Comments)    Cause vertigo/dizziness  . Adhesive [Tape] Rash and Other (See Comments)    PAPER TAPE OK    Family History  Problem Relation Age of Onset  . Hyperlipidemia Mother   . Hypertension Mother   . Colon polyps Mother   . Breast cancer Sister 44       with metastasis to bone; smoker  . Cancer Sister 29       breast cancer  . Coronary artery disease Father         AMI age 60  . Heart disease Father 62       AMI x 2; first AMI in 67s  . Stroke Maternal Grandmother   . Heart disease Paternal Grandfather   . Heart attack Paternal Grandfather   . Colon cancer Maternal Uncle        dx. >50  . Colon cancer Paternal Aunt        dx. 53s  . Heart attack Paternal Uncle   . Alzheimer's disease Paternal Grandmother   . Breast cancer Cousin   . Ovarian cancer Cousin        dx. late 70s/dx. 60s  . Lung cancer Cousin        dx. 11s  . Stomach  cancer Other     Social History   Socioeconomic History  . Marital status: Married    Spouse name: Jocelin Barnes  . Number of children: 2  . Years of education: Not on file  . Highest education level: Not on file  Occupational History  . Occupation: retired    Comment: Network engineer  Social Needs  . Financial resource strain: Not on file  . Food insecurity    Worry: Not on file    Inability: Not on file  . Transportation needs    Medical: Not on file    Non-medical: Not on file  Tobacco Use  . Smoking status: Never Smoker  . Smokeless tobacco: Never Used  Substance and Sexual Activity  . Alcohol use: Yes    Alcohol/week: 0.0 standard drinks    Comment: 3-4 beers per week or wine  . Drug use: No  . Sexual activity: Yes    Birth control/protection: Surgical  Lifestyle  . Physical activity    Days per week: Not on file    Minutes per session: Not on file  . Stress: Not on file  Relationships  . Social Herbalist on phone: Not on file    Gets together: Not on file     Attends religious service: Not on file    Active member of club or organization: Not on file    Attends meetings of clubs or organizations: Not on file    Relationship status: Not on file  . Intimate partner violence    Fear of current or ex partner: Not on file    Emotionally abused: Not on file    Physically abused: Not on file    Forced sexual activity: Not on file  Other Topics Concern  . Not on file  Social History Narrative   Marital status:  Married x 14 years; second marriage;happily married,husband has PTSD.     Children: 2 daughters [name], [name]); 7 grandchildren; 2 gg      Lives: with husband, dog/beagle      Employment: retired since 2013; Network engineer      Tobacco: never      Alcohol: weekends; 1-2 glasses of wine per week or beer.      Drugs:  None      Exercise: none in 2018      Seatbelt: 100%      Guns: unloaded.     Caffeine not every day. 7 grandchildren and 2 GG. Organ donor NO.      ADLs: independent with ADLs; drives      Advanced Directives: YES; FULL CODE no prolonged measures.    Hospitiliaztions: None  Health Maintenance:    Flu: Completed this season   Pneumovax: Completed in 2018  Prevnar: Completed in 2016  Zostavax: Completed in 2013  Shingrix: Completed course  Bone Density: Completed in 2020, osteoporosis   Colonoscopy: Completed in 2017, due in 2027  Eye Doctor: Completes annually   Dental Exam: Completes semi-annually   Mammogram: Completed in 202  Hep C Screening: Negative     Providers: Alma Friendly, PCP; Eye doctor, Dentist, Dermatology.    I have personally reviewed and have noted: 1. The patient's medical and social history 2. Their use of alcohol, tobacco or illicit drugs 3. Their current medications and supplements 4. The patient's functional ability including ADL's, fall risks, home  safety risks and hearing or visual impairment. 5. Diet and physical activities 6. Evidence for depression or mood disorder  Subjective:  Review of Systems:   Constitutional: Denies fever, malaise, fatigue, headache or abrupt weight changes.  HEENT: Denies eye pain, eye redness, ear pain, ringing in the ears, wax buildup, runny nose, nasal congestion, bloody nose, or sore throat. Respiratory: Denies difficulty breathing, shortness of breath, cough or sputum production.   Cardiovascular: Denies chest pain, chest tightness, palpitations or swelling in the hands or feet.  Gastrointestinal: Denies abdominal pain, bloating, constipation, diarrhea or blood in the stool.  GU: Denies urgency, frequency, pain with urination, burning sensation, blood in urine, odor or discharge. Musculoskeletal: Joint aches with stiffness to entire body when waking. Improved once moving and going about her day.  Diagnosed with fibromyalgia.  Skin: Denies redness, rashes, lesions or ulcercations.  Neurological: Denies dizziness, difficulty with memory, difficulty with speech or problems with balance and coordination.  Psychiatric: Denies concerns for anxiety or depression.   No other specific complaints in a complete review of systems (except as listed in HPI above).  Objective:  PE:   BP 120/82   Pulse 72   Temp 97.9 F (36.6 C) (Temporal)   Ht 5\' 1"  (1.549 m)   Wt 129 lb (58.5 kg)   SpO2 95%   BMI 24.37 kg/m  Wt Readings from Last 3 Encounters:  01/25/19 129 lb (58.5 kg)  12/13/17 130 lb 6.4 oz (59.1 kg)  06/15/16 130 lb (59 kg)    General: Appears their stated age, well developed, well nourished in NAD. Skin: Warm, dry and intact. No rashes, lesions or ulcerations noted. HEENT: Head: normal shape and size; Eyes: sclera white, no icterus, conjunctiva pink, PERRLA and EOMs intact; Ears: Tm's gray and intact, normal light reflex; Nose: mucosa pink and moist, septum midline; Throat/Mouth: Teeth present, mucosa pink and moist, no exudate, lesions or ulcerations noted.  Neck: Normal range of motion. Neck supple, trachea midline. No massses,  lumps or thyromegaly present.  Cardiovascular: Normal rate and rhythm. S1,S2 noted.  No murmur, rubs or gallops noted. No JVD or BLE edema. No carotid bruits noted. Pulmonary/Chest: Normal effort and positive vesicular breath sounds. No respiratory distress. No wheezes, rales or ronchi noted.  Abdomen: Soft and nontender. Normal bowel sounds, no bruits noted. No distention or masses noted. Liver, spleen and kidneys non palpable. Musculoskeletal: Normal range of motion. No signs of joint swelling. No difficulty with gait.  Neurological: Alert and oriented. Cranial nerves II-XII intact. Coordination normal. +DTRs bilaterally. Psychiatric: Mood and affect normal. Behavior is normal. Judgment and thought content normal.    BMET    Component Value Date/Time   NA 140 01/17/2019 1050   NA 141 04/07/2016 1348   K 3.7 01/17/2019 1050   CL 103 01/17/2019 1050   CO2 33 (H) 01/17/2019 1050   GLUCOSE 89 01/17/2019 1050   BUN 20 01/17/2019 1050   BUN 16 04/07/2016 1348   CREATININE 0.76 01/17/2019 1050   CREATININE 0.62 09/16/2015 1245   CALCIUM 9.8 01/17/2019 1050   GFRNONAA >60 06/03/2016 1101   GFRAA >60 06/03/2016 1101    Lipid Panel     Component Value Date/Time   CHOL 210 (H) 01/17/2019 1050   CHOL 190 04/07/2016 1348   TRIG 101.0 01/17/2019 1050   HDL 53.10 01/17/2019 1050   HDL 52 04/07/2016 1348   CHOLHDL 4 01/17/2019 1050   VLDL 20.2 01/17/2019 1050   LDLCALC 137 (H) 01/17/2019 1050   LDLCALC 119 (H) 04/07/2016 1348    CBC    Component Value Date/Time   WBC 3.9 (L)  01/17/2019 1050   RBC 4.01 01/17/2019 1050   HGB 13.7 01/17/2019 1050   HGB 12.9 04/07/2016 1348   HCT 40.0 01/17/2019 1050   HCT 39.3 04/07/2016 1348   PLT 191.0 01/17/2019 1050   PLT 214 04/07/2016 1348   MCV 99.8 01/17/2019 1050   MCV 98 (H) 04/07/2016 1348   MCH 33.1 06/03/2016 1101   MCHC 34.1 01/17/2019 1050   RDW 12.3 01/17/2019 1050   RDW 12.4 04/07/2016 1348   LYMPHSABS 1.5 04/07/2016 1348    MONOABS 531 09/16/2015 1245   EOSABS 0.0 04/07/2016 1348   BASOSABS 0.0 04/07/2016 1348    Hgb A1C No results found for: HGBA1C  The 10-year ASCVD risk score Mikey Bussing DC Jr., et al., 2013) is: 8.6%   Values used to calculate the score:     Age: 22 years     Sex: Female     Is Non-Hispanic African American: No     Diabetic: No     Tobacco smoker: No     Systolic Blood Pressure: 123456 mmHg     Is BP treated: No     HDL Cholesterol: 53.1 mg/dL     Total Cholesterol: 210 mg/dL   Assessment and Plan:   Medicare Annual Wellness Visit:  Diet: She endorses a healthy diet.  Physical activity: Active in her home, no regular exercise.  Depression/mood screen: Negative Hearing: Intact to whispered voice Visual acuity: Grossly normal, performs annual eye exam  ADLs: Capable Fall risk: None Home safety: Good Cognitive evaluation: Intact to orientation, naming, recall and repetition EOL planning: Adv directives completed.   Preventative Medicine: Immunizations UTD. Bone density scan and mammogram UTD. Colonoscopy UTD, due in 2027. Encouraged weight bearing exercise, healthy diet. Exam today unremarkable. Labs reviewed. All recommendations provided at end of visit.  Next appointment: One year

## 2019-01-25 NOTE — Assessment & Plan Note (Signed)
Immunizations UTD. Bone density scan and mammogram UTD. Colonoscopy UTD, due in 2027. Encouraged weight bearing exercise, healthy diet. Exam today unremarkable. Labs reviewed. All recommendations provided at end of visit.  I have personally reviewed and have noted: 1. The patient's medical and social history 2. Their use of alcohol, tobacco or illicit drugs 3. Their current medications and supplements 4. The patient's functional ability including ADL's, fall risks, home  safety risks and hearing or visual impairment. 5. Diet and physical activities 6. Evidence for depression or mood disorder

## 2019-01-25 NOTE — Assessment & Plan Note (Signed)
She's describing more arthritis type symptoms rather than fibromyalgia. Continue Tylenol PRN.

## 2019-01-25 NOTE — Assessment & Plan Note (Signed)
Noted on recent Dexa scan. Patient opts for Prolia injections. Recent calcium WNL. Will refer her to [facility] to get her set up for injections.

## 2019-01-25 NOTE — Patient Instructions (Addendum)
Start rosuvastatin (Crestor) 5 mg tablets at bedtime for cholesterol.   We will contact you regarding the Prolia injections for osteoporosis.  Continue exercising. You should be getting 150 minutes of moderate intensity exercise weekly.  Continue to eat a healthy diet. Ensure you are consuming 64 ounces of water daily.  Schedule a lab only appointment for 6 weeks to recheck cholesterol.  It was a pleasure to see you today!

## 2019-01-25 NOTE — Assessment & Plan Note (Signed)
Resolved and no longer on Trazodone. She has transitioned to decaf coffee and is doing much better. Trazodone removed from medication list.

## 2019-01-30 ENCOUNTER — Ambulatory Visit (INDEPENDENT_AMBULATORY_CARE_PROVIDER_SITE_OTHER): Payer: Medicare Other | Admitting: *Deleted

## 2019-01-30 DIAGNOSIS — M810 Age-related osteoporosis without current pathological fracture: Secondary | ICD-10-CM

## 2019-01-30 MED ORDER — DENOSUMAB 60 MG/ML ~~LOC~~ SOSY
60.0000 mg | PREFILLED_SYRINGE | Freq: Once | SUBCUTANEOUS | Status: AC
  Administered 2019-01-30: 60 mg via SUBCUTANEOUS

## 2019-01-30 NOTE — Progress Notes (Signed)
Per orders of Allie Bossier injection of Prolia given by Lauralyn Primes. Patient tolerated injection well.

## 2019-03-13 ENCOUNTER — Other Ambulatory Visit: Payer: Medicare Other

## 2019-03-14 ENCOUNTER — Other Ambulatory Visit: Payer: Self-pay | Admitting: Primary Care

## 2019-03-14 DIAGNOSIS — E785 Hyperlipidemia, unspecified: Secondary | ICD-10-CM

## 2019-03-19 ENCOUNTER — Other Ambulatory Visit (INDEPENDENT_AMBULATORY_CARE_PROVIDER_SITE_OTHER): Payer: Medicare Other

## 2019-03-19 ENCOUNTER — Other Ambulatory Visit: Payer: Self-pay

## 2019-03-19 DIAGNOSIS — E785 Hyperlipidemia, unspecified: Secondary | ICD-10-CM

## 2019-03-19 LAB — HEPATIC FUNCTION PANEL
ALT: 24 U/L (ref 0–35)
AST: 21 U/L (ref 0–37)
Albumin: 4.4 g/dL (ref 3.5–5.2)
Alkaline Phosphatase: 40 U/L (ref 39–117)
Bilirubin, Direct: 0.1 mg/dL (ref 0.0–0.3)
Total Bilirubin: 0.5 mg/dL (ref 0.2–1.2)
Total Protein: 7.1 g/dL (ref 6.0–8.3)

## 2019-03-19 LAB — LIPID PANEL
Cholesterol: 147 mg/dL (ref 0–200)
HDL: 51 mg/dL (ref >39.00)
LDL Cholesterol: 71 mg/dL (ref 0–99)
NonHDL: 96.35
Total CHOL/HDL Ratio: 3
Triglycerides: 125 mg/dL (ref 0.0–149.0)
VLDL: 25 mg/dL (ref 0.0–40.0)

## 2019-06-13 ENCOUNTER — Telehealth: Payer: Self-pay

## 2019-06-13 DIAGNOSIS — M810 Age-related osteoporosis without current pathological fracture: Secondary | ICD-10-CM

## 2019-06-13 NOTE — Telephone Encounter (Signed)
Pt due for next Prolia injection 07-31-19.  Pt has MCR and[facility]ampVa.  Pt would owe approximately $0.

## 2019-06-22 NOTE — Addendum Note (Signed)
Addended by: Randall An on: 06/22/2019 09:51 AM   Modules accepted: Orders

## 2019-06-22 NOTE — Telephone Encounter (Signed)
Contacted pt to schedule for lab apt to check Ca and kidney function before prolia injection. Pt reported she would also like an apt with PCP due to incontinence. This is not a new symptom. Pt denies any other symptoms.  Scheduled pt for 4/30 for PCP apt and labs. Scheduled pt for [place] on 5/27 for prolia injection if all labs are normal.

## 2019-07-06 ENCOUNTER — Encounter: Payer: Self-pay | Admitting: Primary Care

## 2019-07-06 ENCOUNTER — Other Ambulatory Visit: Payer: Self-pay

## 2019-07-06 ENCOUNTER — Ambulatory Visit (INDEPENDENT_AMBULATORY_CARE_PROVIDER_SITE_OTHER): Payer: Medicare Other | Admitting: Primary Care

## 2019-07-06 VITALS — BP 118/70 | HR 78 | Temp 96.2°F | Ht 61.0 in | Wt 126.0 lb

## 2019-07-06 DIAGNOSIS — R300 Dysuria: Secondary | ICD-10-CM

## 2019-07-06 DIAGNOSIS — F419 Anxiety disorder, unspecified: Secondary | ICD-10-CM

## 2019-07-06 DIAGNOSIS — F329 Major depressive disorder, single episode, unspecified: Secondary | ICD-10-CM

## 2019-07-06 DIAGNOSIS — F32A Depression, unspecified: Secondary | ICD-10-CM

## 2019-07-06 LAB — POC URINALSYSI DIPSTICK (AUTOMATED)
Bilirubin, UA: NEGATIVE
Blood, UA: NEGATIVE
Glucose, UA: NEGATIVE
Ketones, UA: NEGATIVE
Leukocytes, UA: NEGATIVE
Nitrite, UA: NEGATIVE
Protein, UA: POSITIVE — AB
Spec Grav, UA: >=1.03 — AB (ref 1.010–1.025)
Urobilinogen, UA: 0.2 E.U./dL
pH, UA: 5.5 (ref 5.0–8.0)

## 2019-07-06 MED ORDER — FLUOXETINE HCL 20 MG PO TABS: 20.0000 mg | ORAL_TABLET | Freq: Every day | ORAL | 0 refills | Status: DC

## 2019-07-06 MED ORDER — BUPROPION HCL ER (XL) 150 MG PO TB24: 150.0000 mg | ORAL_TABLET | Freq: Every day | ORAL | 1 refills | Status: DC

## 2019-07-06 NOTE — Progress Notes (Signed)
Subjective:    Patient ID: Tracy Estrada, female    DOB: Apr 16, 1950, 69 y.o.   MRN: [id number]  HPI  This visit occurred during the SARS-CoV-2 public health emergency.  Safety protocols were in place, including screening questions prior to the visit, additional usage of staff PPE, and extensive cleaning of exam room while observing appropriate contact time as indicated for disinfecting solutions.   Tracy Estrada is a 69 year old female with a history of osteoporosis, fibromyalgia, ovarian mass, hyperlipidemia, hysterectomy, UTI who presents today with a chief complaint of dysuria and depression.  1) Urinary Incontinence/Dysuria: Urinary incontinence that began several months ago which has since improved, but now is having symptoms of incomplete bladder emptying with burning. This began a few days ago. She denies hematuria, vaginal discharge, urinary frequency.  2) Anxiety and Depression: Currently managed on Wellbutrin Xl 150 mg and is taking 300 mg daily. She doesn't feel like the Wellbutrin is effective enough as she will experience symptoms of tearfulness, sadness, anxiety when her husband has episodes of "PTSD". Her husband's episodes occur sporadically and she doesn't know when they will occur. She was once managed on Prozac for which she took along with her Wellbutrin for the same symptoms which helped with anxiety and depression symptoms. She would like to try this again.  Review of Systems  Genitourinary: Positive for difficulty urinating and dysuria. Negative for flank pain, frequency, hematuria and vaginal discharge.       Incomplete bladder emptying  Psychiatric/Behavioral:       See HPI       Past Medical History:  Diagnosis Date  . Achilles bursitis or tendinitis   . Anxiety state, unspecified   . Arthritis   . Closed fracture of unspecified part of fibula   . Depressive disorder, not elsewhere classified   . Diverticulosis of colon (without mention of hemorrhage)   .  Edema   . Fibromyalgia   . Insomnia, unspecified   . Lumbago   . Meniere's disease 1986  . MVP (mitral valve prolapse)   . Other intra-abdominal and pelvic swelling, mass and lump 06/01/2016  . Pain in joint, site unspecified   . Peptic ulcer    no testing  . Pure hypercholesterolemia   . TSH elevation      Social History   Socioeconomic History  . Marital status: Married    Spouse name: Tracy Estrada  . Number of children: 2  . Years of education: Not on file  . Highest education level: Not on file  Occupational History  . Occupation: retired    Comment: Network engineer  Tobacco Use  . Smoking status: Never Smoker  . Smokeless tobacco: Never Used  Substance and Sexual Activity  . Alcohol use: Yes    Alcohol/week: 0.0 standard drinks    Comment: 3-4 beers per week or wine  . Drug use: No  . Sexual activity: Yes    Birth control/protection: Surgical  Other Topics Concern  . Not on file  Social History Narrative   Marital status:  Married x 14 years; second marriage;happily married,husband has PTSD.     Children: 2 daughters Tracy Estrada, Tracy Estrada); 7 grandchildren; 2 gg      Lives: with husband, dog/beagle      Employment: retired since 2013; Network engineer      Tobacco: never      Alcohol: weekends; 1-2 glasses of wine per week or beer.      Drugs:  None      Exercise:  none in 2018      Seatbelt: 100%      Guns: unloaded.     Caffeine not every day. 7 grandchildren and 2 GG. Organ donor NO.      ADLs: independent with ADLs; drives      Advanced Directives: YES; FULL CODE no prolonged measures.   Social Determinants of Health   Financial Resource Strain:   . Difficulty of Paying Living Expenses:   Food Insecurity:   . Worried About Charity fundraiser in the Last Year:   . Arboriculturist in the Last Year:   Transportation Needs:   . Film/video editor (Medical):   Marland Kitchen Lack of Transportation (Non-Medical):   Physical Activity:   . Days of Exercise per Week:   . Minutes of  Exercise per Session:   Stress:   . Feeling of Stress :   Social Connections:   . Frequency of Communication with Friends and Family:   . Frequency of Social Gatherings with Friends and Family:   . Attends Religious Services:   . Active Member of Clubs or Organizations:   . Attends Archivist Meetings:   Marland Kitchen Marital Status:   Intimate Partner Violence:   . Fear of Current or Ex-Partner:   . Emotionally Abused:   Marland Kitchen Physically Abused:   . Sexually Abused:     Past Surgical History:  Procedure Laterality Date  . ABDOMINAL HYSTERECTOMY  03/08/1980   fibroids; ovaries intact.  Marland Kitchen BACK SURGERY     x2 Cervical and lumbar  . CYSTOSCOPY N/A 06/08/2016   Procedure: CYSTOSCOPY;  Surgeon: Will Bonnet, MD;  Location: [facility];  Service: Gynecology;  Laterality: N/A;  . LAPAROSCOPIC LYSIS OF ADHESIONS  06/08/2016   Procedure: LAPAROSCOPIC LYSIS OF ADHESIONS;  Surgeon: Will Bonnet, MD;  Location: [facility];  Service: Gynecology;;  . LAPAROSCOPIC SALPINGO OOPHERECTOMY Left 06/08/2016   Procedure: LAPAROSCOPIC SALPINGO OOPHORECTOMY;  Surgeon: Will Bonnet, MD;  Location: [facility];  Service: Gynecology;  Laterality: Left;  . OOPHORECTOMY     left ovary  . SALPINGECTOMY     left  . SPINE SURGERY     lumbar spine 11/2015; cervical spine 2009. [facility].    Family History  Problem Relation Age of Onset  . Hyperlipidemia Mother   . Hypertension Mother   . Colon polyps Mother   . Breast cancer Sister 34       with metastasis to bone; smoker  . Cancer Sister 75       breast cancer  . Coronary artery disease Father         AMI age 79  . Heart disease Father 84       AMI x 2; first AMI in 67s  . Stroke Maternal Grandmother   . Heart disease Paternal Grandfather   . Heart attack Paternal Grandfather   . Colon cancer Maternal Uncle        dx. >50  . Colon cancer Paternal Aunt        dx. 61s  . Heart attack Paternal Uncle   . Alzheimer's disease Paternal Grandmother   .  Breast cancer Cousin   . Ovarian cancer Cousin        dx. late 70s/dx. 60s  . Lung cancer Cousin        dx. 91s  . Stomach cancer Other     Allergies  Allergen Reactions  . Demerol [Meperidine] Nausea And Vomiting  . Kenalog [Triamcinolone Acetonide]  Other (See Comments)    Cause vertigo/dizziness  . Adhesive [Tape] Rash and Other (See Comments)    PAPER TAPE OK    Current Outpatient Medications on File Prior to Visit  Medication Sig Dispense Refill  . acetaminophen (TYLENOL) 325 MG tablet Take 650 mg by mouth every 6 (six) hours as needed.    . Ascorbic Acid (VITAMIN C PO) Take by mouth.    . Calcium Citrate (CITRACAL PO) Take 2 tablets by mouth 2 (two) times daily.     . Cholecalciferol (VITAMIN D3 PO) Take by mouth.    . diclofenac sodium (VOLTAREN) 1 % GEL Apply topically.    . fluticasone (FLONASE) 50 MCG/ACT nasal spray Place 2 sprays into the nose daily as needed for allergies.     . Multiple Vitamins-Minerals (MULTIVITAMIN WITH MINERALS) tablet Take 1 tablet by mouth daily. Centrum Silver for Women    . Omega-3 Fatty Acids (FISH OIL) 500 MG CAPS Take 500 mg by mouth daily.    Marland Kitchen POTASSIUM PO Take by mouth.    . rosuvastatin (CRESTOR) 5 MG tablet Take 1 tablet (5 mg total) by mouth every evening. For cholesterol. 90 tablet 3   No current facility-administered medications on file prior to visit.    BP 118/70   Pulse 78   Temp (!) 96.2 F (35.7 C) (Temporal)   Ht 5\' 1"  (1.549 m)   Wt 126 lb (57.2 kg)   SpO2 98%   BMI 23.81 kg/m    Objective:   Physical Exam  Constitutional: She appears well-nourished.  Cardiovascular: Normal rate and regular rhythm.  Respiratory: Effort normal and breath sounds normal.  Musculoskeletal:     Cervical back: Neck supple.  Skin: Skin is warm and dry.  Psychiatric: She has a normal mood and affect.           Assessment & Plan:

## 2019-07-06 NOTE — Assessment & Plan Note (Signed)
Also with incomplete bladder emptying x a few days. UA today negative. Culture sent.  Offered pelvic PT vs Urology evaluation for incomplete bladder emptying and urinary incontinence, she kindly declines.   She will monitor symptoms for now, especially given that her incontinence has improved, and will update with any changes.

## 2019-07-06 NOTE — Patient Instructions (Signed)
Please update me regarding your urinary symptoms.  We've added fluoxetine 20 mg to your regimen. Take once daily in the morning. Please update me in 4-6 weeks.  It was a pleasure to see you today!

## 2019-07-06 NOTE — Assessment & Plan Note (Signed)
Deteriorated, reactive to husband's PTSD symptoms. Add in low dose fluoxetine as she's done well on this in the past. Continue Wellbutrin XL 300 mg.  She will update.

## 2019-07-07 LAB — URINE CULTURE
MICRO NUMBER:: 10425949
SPECIMEN QUALITY:: ADEQUATE

## 2019-07-09 NOTE — Telephone Encounter (Signed)
Pt called to report she forgot to get labs at [facility] on 4/30 and has been Rs to 5/5. Advised this office will f/u if any problems with lab results. Pt verbalized understanding.

## 2019-07-11 ENCOUNTER — Other Ambulatory Visit (INDEPENDENT_AMBULATORY_CARE_PROVIDER_SITE_OTHER): Payer: Medicare Other

## 2019-07-11 ENCOUNTER — Other Ambulatory Visit: Payer: Self-pay

## 2019-07-11 DIAGNOSIS — M810 Age-related osteoporosis without current pathological fracture: Secondary | ICD-10-CM | POA: Diagnosis not present

## 2019-07-11 LAB — BASIC METABOLIC PANEL
BUN: 18 mg/dL (ref 6–23)
CO2: 31 mEq/L (ref 19–32)
Calcium: 10.1 mg/dL (ref 8.4–10.5)
Chloride: 103 mEq/L (ref 96–112)
Creatinine, Ser: 0.69 mg/dL (ref 0.40–1.20)
GFR: 83.87 mL/min (ref >60.00)
Glucose, Bld: 87 mg/dL (ref 70–99)
Potassium: 4.2 mEq/L (ref 3.5–5.1)
Sodium: 140 mEq/L (ref 135–145)

## 2019-07-12 NOTE — Telephone Encounter (Signed)
Ca lab normal and CrCl is 67.53. Pt is clear for injection scheduled for 5/27.

## 2019-08-02 ENCOUNTER — Ambulatory Visit (INDEPENDENT_AMBULATORY_CARE_PROVIDER_SITE_OTHER): Payer: Medicare Other | Admitting: *Deleted

## 2019-08-02 DIAGNOSIS — M810 Age-related osteoporosis without current pathological fracture: Secondary | ICD-10-CM

## 2019-08-02 MED ORDER — DENOSUMAB 60 MG/ML ~~LOC~~ SOSY
60.0000 mg | PREFILLED_SYRINGE | Freq: Once | SUBCUTANEOUS | Status: AC
  Administered 2019-08-02: 60 mg via SUBCUTANEOUS

## 2019-08-02 NOTE — Progress Notes (Signed)
Per orders of Dr. Diona Browner (in the absence for Allie Bossier, NP) , injection of denosumab (PROLIA) 60 mg given by Jacqualin Combes. Patient tolerated injection well.

## 2019-09-04 ENCOUNTER — Ambulatory Visit (INDEPENDENT_AMBULATORY_CARE_PROVIDER_SITE_OTHER): Payer: Medicare Other | Admitting: Primary Care

## 2019-09-04 ENCOUNTER — Encounter: Payer: Self-pay | Admitting: Primary Care

## 2019-09-04 ENCOUNTER — Other Ambulatory Visit: Payer: Self-pay

## 2019-09-04 VITALS — BP 120/72 | HR 80 | Temp 96.2°F | Ht 61.0 in | Wt 129.5 lb

## 2019-09-04 DIAGNOSIS — R8290 Unspecified abnormal findings in urine: Secondary | ICD-10-CM

## 2019-09-04 LAB — POC URINALSYSI DIPSTICK (AUTOMATED)
Bilirubin, UA: NEGATIVE
Blood, UA: NEGATIVE
Glucose, UA: NEGATIVE
Ketones, UA: NEGATIVE
Leukocytes, UA: NEGATIVE
Nitrite, UA: POSITIVE
Protein, UA: NEGATIVE
Spec Grav, UA: 1.015 (ref 1.010–1.025)
Urobilinogen, UA: 1 E.U./dL
pH, UA: 7 (ref 5.0–8.0)

## 2019-09-04 NOTE — Patient Instructions (Signed)
Continue to drink plenty of water.  I'll be in touch with your culture results later this week.  It was a pleasure to see you today!

## 2019-09-04 NOTE — Progress Notes (Signed)
Subjective:    Patient ID: Tracy Estrada, female    DOB: [birth year], 69 y.o.   MRN: [id number]  HPI  This visit occurred during the SARS-CoV-2 public health emergency.  Safety protocols were in place, including screening questions prior to the visit, additional usage of staff PPE, and extensive cleaning of exam room while observing appropriate contact time as indicated for disinfecting solutions.   Tracy Estrada is a 69 year old female with a history of ovarian mass, dysuria who presents today with a chief complaint of foul smelling urine.   She denies dysuria, vaginal discharge, vaginal itching, hematuria. Symptoms began about one month ago. She's been wearing panty liners for several months, no recent use.  She endorses drinking water only, has been well hydrated.   Review of Systems  Gastrointestinal: Negative for abdominal pain.  Genitourinary: Negative for dysuria, frequency, hematuria, pelvic pain and vaginal discharge.       Foul smelling urine       Past Medical History:  Diagnosis Date  . Achilles bursitis or tendinitis   . Anxiety state, unspecified   . Arthritis   . Closed fracture of unspecified part of fibula   . Depressive disorder, not elsewhere classified   . Diverticulosis of colon (without mention of hemorrhage)   . Edema   . Fibromyalgia   . Insomnia, unspecified   . Lumbago   . Meniere's disease 1986  . MVP (mitral valve prolapse)   . Other intra-abdominal and pelvic swelling, mass and lump 06/01/2016  . Pain in joint, site unspecified   . Peptic ulcer    no testing  . Pure hypercholesterolemia   . TSH elevation      Social History   Socioeconomic History  . Marital status: Married    Spouse name: Erika Slaby  . Number of children: 2  . Years of education: Not on file  . Highest education level: Not on file  Occupational History  . Occupation: retired    Comment: Network engineer  Tobacco Use  . Smoking status: Never Smoker  . Smokeless tobacco:  Never Used  Substance and Sexual Activity  . Alcohol use: Yes    Alcohol/week: 0.0 standard drinks    Comment: 3-4 beers per week or wine  . Drug use: No  . Sexual activity: Yes    Birth control/protection: Surgical  Other Topics Concern  . Not on file  Social History Narrative   Marital status:  Married x 14 years; second marriage;happily married,husband has PTSD.     Children: 2 daughters [name], [name]); 7 grandchildren; 2 gg      Lives: with husband, dog/beagle      Employment: retired since 2013; Network engineer      Tobacco: never      Alcohol: weekends; 1-2 glasses of wine per week or beer.      Drugs:  None      Exercise: none in 2018      Seatbelt: 100%      Guns: unloaded.     Caffeine not every day. 7 grandchildren and 2 GG. Organ donor NO.      ADLs: independent with ADLs; drives      Advanced Directives: YES; FULL CODE no prolonged measures.   Social Determinants of Health   Financial Resource Strain:   . Difficulty of Paying Living Expenses:   Food Insecurity:   . Worried About Charity fundraiser in the Last Year:   . [place] in the Last  Year:   Transportation Needs:   . Film/video editor (Medical):   Marland Kitchen Lack of Transportation (Non-Medical):   Physical Activity:   . Days of Exercise per Week:   . Minutes of Exercise per Session:   Stress:   . Feeling of Stress :   Social Connections:   . Frequency of Communication with Friends and Family:   . Frequency of Social Gatherings with Friends and Family:   . Attends Religious Services:   . Active Member of Clubs or Organizations:   . Attends Archivist Meetings:   Marland Kitchen Marital Status:   Intimate Partner Violence:   . Fear of Current or Ex-Partner:   . Emotionally Abused:   Marland Kitchen Physically Abused:   . Sexually Abused:     Past Surgical History:  Procedure Laterality Date  . ABDOMINAL HYSTERECTOMY  03/08/1980   fibroids; ovaries intact.  Marland Kitchen BACK SURGERY     x2 Cervical and lumbar  . CYSTOSCOPY N/A  06/08/2016   Procedure: CYSTOSCOPY;  Surgeon: Will Bonnet, MD;  Location: [facility];  Service: Gynecology;  Laterality: N/A;  . LAPAROSCOPIC LYSIS OF ADHESIONS  06/08/2016   Procedure: LAPAROSCOPIC LYSIS OF ADHESIONS;  Surgeon: Will Bonnet, MD;  Location: [facility];  Service: Gynecology;;  . LAPAROSCOPIC SALPINGO OOPHERECTOMY Left 06/08/2016   Procedure: LAPAROSCOPIC SALPINGO OOPHORECTOMY;  Surgeon: Will Bonnet, MD;  Location: [facility];  Service: Gynecology;  Laterality: Left;  . OOPHORECTOMY     left ovary  . SALPINGECTOMY     left  . SPINE SURGERY     lumbar spine 11/2015; cervical spine 2009. [facility].    Family History  Problem Relation Age of Onset  . Hyperlipidemia Mother   . Hypertension Mother   . Colon polyps Mother   . Breast cancer Sister 34       with metastasis to bone; smoker  . Cancer Sister 39       breast cancer  . Coronary artery disease Father         AMI age 89  . Heart disease Father 54       AMI x 2; first AMI in 63s  . Stroke Maternal Grandmother   . Heart disease Paternal Grandfather   . Heart attack Paternal Grandfather   . Colon cancer Maternal Uncle        dx. >50  . Colon cancer Paternal Aunt        dx. 28s  . Heart attack Paternal Uncle   . Alzheimer's disease Paternal Grandmother   . Breast cancer Cousin   . Ovarian cancer Cousin        dx. late 70s/dx. 60s  . Lung cancer Cousin        dx. 13s  . Stomach cancer Other     Allergies  Allergen Reactions  . Demerol [Meperidine] Nausea And Vomiting  . Kenalog [Triamcinolone Acetonide] Other (See Comments)    Cause vertigo/dizziness  . Adhesive [Tape] Rash and Other (See Comments)    PAPER TAPE OK    Current Outpatient Medications on File Prior to Visit  Medication Sig Dispense Refill  . acetaminophen (TYLENOL) 325 MG tablet Take 650 mg by mouth every 6 (six) hours as needed.    . Ascorbic Acid (VITAMIN C PO) Take by mouth.    Marland Kitchen buPROPion (WELLBUTRIN XL) 150 MG 24 hr tablet Take  1-2 tablets (150-300 mg total) by mouth daily. For depression. 180 tablet 1  . Calcium Citrate (CITRACAL PO) Take  2 tablets by mouth 2 (two) times daily.     . Cholecalciferol (VITAMIN D3 PO) Take by mouth.    . diclofenac sodium (VOLTAREN) 1 % GEL Apply topically.    Marland Kitchen FLUoxetine (PROZAC) 20 MG tablet Take 1 tablet (20 mg total) by mouth daily. For depression. 90 tablet 0  . fluticasone (FLONASE) 50 MCG/ACT nasal spray Place 2 sprays into the nose daily as needed for allergies.     . Multiple Vitamins-Minerals (MULTIVITAMIN WITH MINERALS) tablet Take 1 tablet by mouth daily. Centrum Silver for Women    . Omega-3 Fatty Acids (FISH OIL) 500 MG CAPS Take 500 mg by mouth daily.    Marland Kitchen POTASSIUM PO Take by mouth.    . rosuvastatin (CRESTOR) 5 MG tablet Take 1 tablet (5 mg total) by mouth every evening. For cholesterol. 90 tablet 3   No current facility-administered medications on file prior to visit.    BP 120/72   Pulse 80   Temp (!) 96.2 F (35.7 C) (Temporal)   Ht 5\' 1"  (1.549 m)   Wt 129 lb 8 oz (58.7 kg)   SpO2 97%   BMI 24.47 kg/m    Objective:   Physical Exam Cardiovascular:     Rate and Rhythm: Normal rate and regular rhythm.  Pulmonary:     Effort: Pulmonary effort is normal.     Breath sounds: Normal breath sounds.  Abdominal:     Tenderness: There is no right CVA tenderness or left CVA tenderness.  Musculoskeletal:     Cervical back: Neck supple.  Skin:    General: Skin is warm and dry.            Assessment & Plan:

## 2019-09-04 NOTE — Assessment & Plan Note (Signed)
Acute for the last month, no other symptoms. UA today positive for nitrites, otherwise negative.  Given negative prior urine culture, and lack of other symptoms, we will send culture and await results. Treatment to be initiated if necessary.  She is stable for outpatient treatment.

## 2019-09-06 ENCOUNTER — Other Ambulatory Visit: Payer: Self-pay | Admitting: Primary Care

## 2019-09-06 ENCOUNTER — Telehealth: Payer: Self-pay | Admitting: Primary Care

## 2019-09-06 DIAGNOSIS — N3000 Acute cystitis without hematuria: Secondary | ICD-10-CM

## 2019-09-06 LAB — URINE CULTURE
MICRO NUMBER:: 10647422
SPECIMEN QUALITY:: ADEQUATE

## 2019-09-06 MED ORDER — SULFAMETHOXAZOLE-TRIMETHOPRIM 800-160 MG PO TABS: 1.0000 | ORAL_TABLET | Freq: Two times a day (BID) | ORAL | 0 refills | Status: DC

## 2019-09-06 NOTE — Telephone Encounter (Signed)
Patient seen her results and Kate's comments at 2:03 pm 09/06/2019

## 2019-09-06 NOTE — Telephone Encounter (Signed)
Patient called to get the results of her urine culture. Please call patient.

## 2019-09-07 ENCOUNTER — Telehealth: Payer: Self-pay | Admitting: Primary Care

## 2019-09-07 DIAGNOSIS — N3000 Acute cystitis without hematuria: Secondary | ICD-10-CM

## 2019-09-07 MED ORDER — NITROFURANTOIN MONOHYD MACRO 100 MG PO CAPS: 100.0000 mg | ORAL_CAPSULE | Freq: Two times a day (BID) | ORAL | 0 refills | Status: AC

## 2019-09-07 NOTE — Telephone Encounter (Signed)
Noted, new Rx for Macrobid sent to pharmacy.

## 2019-09-07 NOTE — Telephone Encounter (Signed)
Patient called.  Patient started the Bactrim DS last night. She took a pill last night and this morning.  Patient said she feels terrible.  She is very nauseous.  Patient is asking for a different antibiotic to be called in to [facility].

## 2019-09-27 ENCOUNTER — Other Ambulatory Visit: Payer: Self-pay | Admitting: Primary Care

## 2019-09-27 DIAGNOSIS — F32A Depression, unspecified: Secondary | ICD-10-CM

## 2019-11-26 ENCOUNTER — Telehealth: Payer: Self-pay

## 2019-11-26 NOTE — Telephone Encounter (Signed)
Entered in [place]. Results in 5 days.  Last inj 08/02/2019.  Will need labs

## 2019-12-03 ENCOUNTER — Telehealth: Payer: Self-pay | Admitting: Primary Care

## 2019-12-03 NOTE — Telephone Encounter (Signed)
Please advise 

## 2019-12-03 NOTE — Telephone Encounter (Signed)
Pt called wanting to know if you recommend her getting the covid booster shot/  Pfizer Can leave message

## 2019-12-04 NOTE — Telephone Encounter (Signed)
Called and let patient know will call if any questions.

## 2019-12-04 NOTE — Telephone Encounter (Signed)
Yes, absolutely.

## 2019-12-05 DIAGNOSIS — Z23 Encounter for immunization: Secondary | ICD-10-CM | POA: Diagnosis not present

## 2019-12-11 ENCOUNTER — Other Ambulatory Visit: Payer: Self-pay

## 2019-12-11 DIAGNOSIS — M810 Age-related osteoporosis without current pathological fracture: Secondary | ICD-10-CM

## 2019-12-11 NOTE — Telephone Encounter (Signed)
Called patient no out of pocket cost.  Lab app made and nurse visit appointment made.

## 2019-12-14 NOTE — Telephone Encounter (Signed)
Last inj was 08/02/19. Lab scheduled for 01/23/20 and [place] for inj scheduled for 02/05/20.

## 2020-01-09 DIAGNOSIS — Z23 Encounter for immunization: Secondary | ICD-10-CM | POA: Diagnosis not present

## 2020-01-13 ENCOUNTER — Other Ambulatory Visit: Payer: Self-pay | Admitting: Primary Care

## 2020-01-13 DIAGNOSIS — E785 Hyperlipidemia, unspecified: Secondary | ICD-10-CM

## 2020-01-21 LAB — HM MAMMOGRAPHY

## 2020-01-22 DIAGNOSIS — Z872 Personal history of diseases of the skin and subcutaneous tissue: Secondary | ICD-10-CM | POA: Diagnosis not present

## 2020-01-22 DIAGNOSIS — I781 Nevus, non-neoplastic: Secondary | ICD-10-CM | POA: Diagnosis not present

## 2020-01-22 DIAGNOSIS — L578 Other skin changes due to chronic exposure to nonionizing radiation: Secondary | ICD-10-CM | POA: Diagnosis not present

## 2020-01-23 ENCOUNTER — Other Ambulatory Visit (INDEPENDENT_AMBULATORY_CARE_PROVIDER_SITE_OTHER): Payer: Medicare Other

## 2020-01-23 ENCOUNTER — Ambulatory Visit (INDEPENDENT_AMBULATORY_CARE_PROVIDER_SITE_OTHER): Payer: Medicare Other

## 2020-01-23 ENCOUNTER — Other Ambulatory Visit: Payer: Self-pay

## 2020-01-23 DIAGNOSIS — Z0000 Encounter for general adult medical examination without abnormal findings: Secondary | ICD-10-CM | POA: Diagnosis not present

## 2020-01-23 DIAGNOSIS — E785 Hyperlipidemia, unspecified: Secondary | ICD-10-CM | POA: Diagnosis not present

## 2020-01-23 LAB — COMPREHENSIVE METABOLIC PANEL
ALT: 18 U/L (ref 0–35)
AST: 19 U/L (ref 0–37)
Albumin: 4.6 g/dL (ref 3.5–5.2)
Alkaline Phosphatase: 38 U/L — ABNORMAL LOW (ref 39–117)
BUN: 26 mg/dL — ABNORMAL HIGH (ref 6–23)
CO2: 32 mEq/L (ref 19–32)
Calcium: 9.8 mg/dL (ref 8.4–10.5)
Chloride: 104 mEq/L (ref 96–112)
Creatinine, Ser: 0.64 mg/dL (ref 0.40–1.20)
GFR: 88.83 mL/min (ref >60.00)
Glucose, Bld: 89 mg/dL (ref 70–99)
Potassium: 4 mEq/L (ref 3.5–5.1)
Sodium: 141 mEq/L (ref 135–145)
Total Bilirubin: 0.5 mg/dL (ref 0.2–1.2)
Total Protein: 7.2 g/dL (ref 6.0–8.3)

## 2020-01-23 LAB — LIPID PANEL
Cholesterol: 151 mg/dL (ref 0–200)
HDL: 55.5 mg/dL (ref >39.00)
LDL Cholesterol: 80 mg/dL (ref 0–99)
NonHDL: 95.55
Total CHOL/HDL Ratio: 3
Triglycerides: 80 mg/dL (ref 0.0–149.0)
VLDL: 16 mg/dL (ref 0.0–40.0)

## 2020-01-23 LAB — CBC
HCT: 41 % (ref 36.0–46.0)
Hemoglobin: 13.9 g/dL (ref 12.0–15.0)
MCHC: 34 g/dL (ref 30.0–36.0)
MCV: 98.8 fl (ref 78.0–100.0)
Platelets: 156 10*3/uL (ref 150.0–400.0)
RBC: 4.15 Mil/uL (ref 3.87–5.11)
RDW: 12.8 % (ref 11.5–15.5)
WBC: 3.2 10*3/uL — ABNORMAL LOW (ref 4.0–10.5)

## 2020-01-23 NOTE — Patient Instructions (Signed)
[name] , Thank you for taking time to come for your Medicare Wellness Visit. I appreciate your ongoing commitment to your health goals. Please review the following plan we discussed and let me know if I can assist you in the future.   Screening recommendations/referrals: Colonoscopy: Up to date, completed 04/07/2015, due 03/2025 Mammogram: Up to date, completed 01/21/2020, due 01/2021 Bone Density: Up to date, completed 01/15/2019, due 01/2021 Recommended yearly ophthalmology/optometry visit for glaucoma screening and checkup Recommended yearly dental visit for hygiene and checkup  Vaccinations: Influenza vaccine: Up to date, completed 01/09/2020, due 10/2020 Pneumococcal vaccine: Completed series Tdap vaccine: Up to date, completed 08/10/2011, due 08/2021 Shingles vaccine: Completed series   Covid-19:Completed series  Advanced directives: Please bring a copy of your POA (Power of [place]) and/or Living Will to your next appointment.   Conditions/risks identified: hyperlipidemia  Next appointment: Follow up in one year for your annual wellness visit    Preventive Care 65 Years and Older, Female Preventive care refers to lifestyle choices and visits with your health care provider that can promote health and wellness. What does preventive care include?  A yearly physical exam. This is also called an annual well check.  Dental exams once or twice a year.  Routine eye exams. Ask your health care provider how often you should have your eyes checked.  Personal lifestyle choices, including:  Daily care of your teeth and gums.  Regular physical activity.  Eating a healthy diet.  Avoiding tobacco and drug use.  Limiting alcohol use.  Practicing safe sex.  Taking low-dose aspirin every day.  Taking vitamin and mineral supplements as recommended by your health care provider. What happens during an annual well check? The services and screenings done by your health care  provider during your annual well check will depend on your age, overall health, lifestyle risk factors, and family history of disease. Counseling  Your health care provider may ask you questions about your:  Alcohol use.  Tobacco use.  Drug use.  Emotional well-being.  Home and relationship well-being.  Sexual activity.  Eating habits.  History of falls.  Memory and ability to understand (cognition).  Work and work Statistician.  Reproductive health. Screening  You may have the following tests or measurements:  Height, weight, and BMI.  Blood pressure.  Lipid and cholesterol levels. These may be checked every 5 years, or more frequently if you are over 72 years old.  Skin check.  Lung cancer screening. You may have this screening every year starting at age 80 if you have a 30-pack-year history of smoking and currently smoke or have quit within the past 15 years.  Fecal occult blood test (FOBT) of the stool. You may have this test every year starting at age 41.  Flexible sigmoidoscopy or colonoscopy. You may have a sigmoidoscopy every 5 years or a colonoscopy every 10 years starting at age 36.  Hepatitis C blood test.  Hepatitis B blood test.  Sexually transmitted disease (STD) testing.  Diabetes screening. This is done by checking your blood sugar (glucose) after you have not eaten for a while (fasting). You may have this done every 1-3 years.  Bone density scan. This is done to screen for osteoporosis. You may have this done starting at age 65.  Mammogram. This may be done every 1-2 years. Talk to your health care provider about how often you should have regular mammograms. Talk with your health care provider about your test results, treatment options, and if  necessary, the need for more tests. Vaccines  Your health care provider may recommend certain vaccines, such as:  Influenza vaccine. This is recommended every year.  Tetanus, diphtheria, and acellular  pertussis (Tdap, Td) vaccine. You may need a Td booster every 10 years.  Zoster vaccine. You may need this after age 47.  Pneumococcal 13-valent conjugate (PCV13) vaccine. One dose is recommended after age 57.  Pneumococcal polysaccharide (PPSV23) vaccine. One dose is recommended after age 72. Talk to your health care provider about which screenings and vaccines you need and how often you need them. This information is not intended to replace advice given to you by your health care provider. Make sure you discuss any questions you have with your health care provider. Document Released: 03/21/2015 Document Revised: 11/12/2015 Document Reviewed: 12/24/2014 Elsevier Interactive Patient Education  2017 [place] Prevention in the Home Falls can cause injuries. They can happen to people of all ages. There are many things you can do to make your home safe and to help prevent falls. What can I do on the outside of my home?  Regularly fix the edges of walkways and driveways and fix any cracks.  Remove anything that might make you trip as you walk through a door, such as a raised step or threshold.  Trim any bushes or trees on the path to your home.  Use bright outdoor lighting.  Clear any walking paths of anything that might make someone trip, such as rocks or tools.  Regularly check to see if handrails are loose or broken. Make sure that both sides of any steps have handrails.  Any raised decks and porches should have guardrails on the edges.  Have any leaves, snow, or ice cleared regularly.  Use sand or salt on walking paths during winter.  Clean up any spills in your garage right away. This includes oil or grease spills. What can I do in the bathroom?  Use night lights.  Install grab bars by the toilet and in the tub and shower. Do not use towel bars as grab bars.  Use non-skid mats or decals in the tub or shower.  If you need to sit down in the shower, use a plastic,  non-slip stool.  Keep the floor dry. Clean up any water that spills on the floor as soon as it happens.  Remove soap buildup in the tub or shower regularly.  Attach bath mats securely with double-sided non-slip rug tape.  Do not have throw rugs and other things on the floor that can make you trip. What can I do in the bedroom?  Use night lights.  Make sure that you have a light by your bed that is easy to reach.  Do not use any sheets or blankets that are too big for your bed. They should not hang down onto the floor.  Have a firm chair that has side arms. You can use this for support while you get dressed.  Do not have throw rugs and other things on the floor that can make you trip. What can I do in the kitchen?  Clean up any spills right away.  Avoid walking on wet floors.  Keep items that you use a lot in easy-to-reach places.  If you need to reach something above you, use a strong step stool that has a grab bar.  Keep electrical cords out of the way.  Do not use floor polish or wax that makes floors slippery. If you must  use wax, use non-skid floor wax.  Do not have throw rugs and other things on the floor that can make you trip. What can I do with my stairs?  Do not leave any items on the stairs.  Make sure that there are handrails on both sides of the stairs and use them. Fix handrails that are broken or loose. Make sure that handrails are as long as the stairways.  Check any carpeting to make sure that it is firmly attached to the stairs. Fix any carpet that is loose or worn.  Avoid having throw rugs at the top or bottom of the stairs. If you do have throw rugs, attach them to the floor with carpet tape.  Make sure that you have a light switch at the top of the stairs and the bottom of the stairs. If you do not have them, ask someone to add them for you. What else can I do to help prevent falls?  Wear shoes that:  Do not have high heels.  Have rubber  bottoms.  Are comfortable and fit you well.  Are closed at the toe. Do not wear sandals.  If you use a stepladder:  Make sure that it is fully opened. Do not climb a closed stepladder.  Make sure that both sides of the stepladder are locked into place.  Ask someone to hold it for you, if possible.  Clearly mark and make sure that you can see:  Any grab bars or handrails.  First and last steps.  Where the edge of each step is.  Use tools that help you move around (mobility aids) if they are needed. These include:  Canes.  Walkers.  Scooters.  Crutches.  Turn on the lights when you go into a dark area. Replace any light bulbs as soon as they burn out.  Set up your furniture so you have a clear path. Avoid moving your furniture around.  If any of your floors are uneven, fix them.  If there are any pets around you, be aware of where they are.  Review your medicines with your doctor. Some medicines can make you feel dizzy. This can increase your chance of falling. Ask your doctor what other things that you can do to help prevent falls. This information is not intended to replace advice given to you by your health care provider. Make sure you discuss any questions you have with your health care provider. Document Released: 12/19/2008 Document Revised: 07/31/2015 Document Reviewed: 03/29/2014 Elsevier Interactive Patient Education  2017 [employer].

## 2020-01-23 NOTE — Progress Notes (Signed)
Subjective:   Tracy Estrada is a 69 y.o. female who presents for Medicare Annual (Subsequent) preventive examination.  Review of Systems: N/A     I connected with the patient today by telephone and verified that I am speaking with the correct person using two identifiers. Location patient: home Location nurse: work Persons participating in the telephone visit: patient, nurse.   I discussed the limitations, risks, security and privacy concerns of performing an evaluation and management service by telephone and the availability of in person appointments. I also discussed with the patient that there may be a patient responsible charge related to this service. The patient expressed understanding and verbally consented to this telephonic visit.        Cardiac Risk Factors include: advanced age (>59men, >24 women);Other (see comment), Risk factor comments: hyperlipidemia     Objective:    Today's Vitals   01/23/20 1356  PainSc: 5    There is no height or weight on file to calculate BMI.  Advanced Directives 01/23/2020 06/03/2016  Does Patient Have a Medical Advance Directive? Yes Yes  Type of Paramedic of [place];Living will [place];Living will  Copy of [place] in Chart? No - copy requested -    Current Medications (verified) Outpatient Encounter Medications as of 01/23/2020  Medication Sig  . acetaminophen (TYLENOL) 325 MG tablet Take 650 mg by mouth every 6 (six) hours as needed.  Marland Kitchen Apoaequorin (PREVAGEN PO) Take by mouth.  . Ascorbic Acid (VITAMIN C PO) Take by mouth.  Marland Kitchen buPROPion (WELLBUTRIN XL) 150 MG 24 hr tablet Take 1-2 tablets (150-300 mg total) by mouth daily. For depression.  . Calcium Citrate (CITRACAL PO) Take 2 tablets by mouth 2 (two) times daily.   . Cholecalciferol (VITAMIN D3 PO) Take by mouth.  . diclofenac sodium (VOLTAREN) 1 % GEL Apply topically.  Marland Kitchen FLUoxetine (PROZAC) 20 MG tablet TAKE 1  TABLET BY MOUTH EVERY DAY FOR DEPRESSION  . fluticasone (FLONASE) 50 MCG/ACT nasal spray Place 2 sprays into the nose daily as needed for allergies.   . Multiple Vitamins-Minerals (MULTIVITAMIN WITH MINERALS) tablet Take 1 tablet by mouth daily. Centrum Silver for Women  . Omega-3 Fatty Acids (FISH OIL) 500 MG CAPS Take 500 mg by mouth daily.  Marland Kitchen POTASSIUM PO Take by mouth.  . rosuvastatin (CRESTOR) 5 MG tablet Take 1 tablet (5 mg total) by mouth every evening. For cholesterol.   No facility-administered encounter medications on file as of 01/23/2020.    Allergies (verified) Demerol [meperidine], Kenalog [triamcinolone acetonide], and Adhesive [tape]   History: Past Medical History:  Diagnosis Date  . Achilles bursitis or tendinitis   . Anxiety state, unspecified   . Arthritis   . Closed fracture of unspecified part of fibula   . Depressive disorder, not elsewhere classified   . Diverticulosis of colon (without mention of hemorrhage)   . Edema   . Fibromyalgia   . Insomnia, unspecified   . Lumbago   . Meniere's disease 1986  . MVP (mitral valve prolapse)   . Other intra-abdominal and pelvic swelling, mass and lump 06/01/2016  . Pain in joint, site unspecified   . Peptic ulcer    no testing  . Pure hypercholesterolemia   . TSH elevation    Past Surgical History:  Procedure Laterality Date  . ABDOMINAL HYSTERECTOMY  03/08/1980   fibroids; ovaries intact.  Marland Kitchen BACK SURGERY     x2 Cervical and lumbar  . CYSTOSCOPY  N/A 06/08/2016   Procedure: CYSTOSCOPY;  Surgeon: Will Bonnet, MD;  Location: [facility] ORS;  Service: Gynecology;  Laterality: N/A;  . LAPAROSCOPIC LYSIS OF ADHESIONS  06/08/2016   Procedure: LAPAROSCOPIC LYSIS OF ADHESIONS;  Surgeon: Will Bonnet, MD;  Location: [facility] ORS;  Service: Gynecology;;  . LAPAROSCOPIC SALPINGO OOPHERECTOMY Left 06/08/2016   Procedure: LAPAROSCOPIC SALPINGO OOPHORECTOMY;  Surgeon: Will Bonnet, MD;  Location: [facility] ORS;  Service:  Gynecology;  Laterality: Left;  . OOPHORECTOMY     left ovary  . SALPINGECTOMY     left  . SPINE SURGERY     lumbar spine 11/2015; cervical spine 2009. [place].   Family History  Problem Relation Age of Onset  . Hyperlipidemia Mother   . Hypertension Mother   . Colon polyps Mother   . Breast cancer Sister 22       with metastasis to bone; smoker  . Cancer Sister 80       breast cancer  . Coronary artery disease Father         AMI age 35  . Heart disease Father 46       AMI x 2; first AMI in 25s  . Stroke Maternal Grandmother   . Heart disease Paternal Grandfather   . Heart attack Paternal Grandfather   . Colon cancer Maternal Uncle        dx. >50  . Colon cancer Paternal Aunt        dx. 37s  . Heart attack Paternal Uncle   . Alzheimer's disease Paternal Grandmother   . Breast cancer Cousin   . Ovarian cancer Cousin        dx. late 70s/dx. 60s  . Lung cancer Cousin        dx. 61s  . Stomach cancer Other    Social History   Socioeconomic History  . Marital status: Married    Spouse name: Sakia Schrimpf  . Number of children: 2  . Years of education: Not on file  . Highest education level: Not on file  Occupational History  . Occupation: retired    Comment: Network engineer  Tobacco Use  . Smoking status: Never Smoker  . Smokeless tobacco: Never Used  Substance and Sexual Activity  . Alcohol use: Yes    Alcohol/week: 0.0 standard drinks    Comment: 3-4 beers per week or wine  . Drug use: No  . Sexual activity: Yes    Birth control/protection: Surgical  Other Topics Concern  . Not on file  Social History Narrative   Marital status:  Married x 14 years; second marriage;happily married,husband has PTSD.     Children: 2 daughters [name], [name]); 7 grandchildren; 2 gg      Lives: with husband, dog/beagle      Employment: retired since 2013; Network engineer      Tobacco: never      Alcohol: weekends; 1-2 glasses of wine per week or beer.      Drugs:  None      Exercise: none  in 2018      Seatbelt: 100%      Guns: unloaded.     Caffeine not every day. 7 grandchildren and 2 GG. Organ donor NO.      ADLs: independent with ADLs; drives      Advanced Directives: YES; FULL CODE no prolonged measures.   Social Determinants of Health   Financial Resource Strain: Low Risk   . Difficulty of Paying Living Expenses: Not hard at all  Food Insecurity: No Food Insecurity  . Worried About Charity fundraiser in the Last Year: Never true  . Ran Out of Food in the Last Year: Never true  Transportation Needs: No Transportation Needs  . Lack of Transportation (Medical): No  . Lack of Transportation (Non-Medical): No  Physical Activity: Inactive  . Days of Exercise per Week: 0 days  . Minutes of Exercise per Session: 0 min  Stress: No Stress Concern Present  . Feeling of Stress : Not at all  Social Connections:   . Frequency of Communication with Friends and Family: Not on file  . Frequency of Social Gatherings with Friends and Family: Not on file  . Attends Religious Services: Not on file  . Active Member of Clubs or Organizations: Not on file  . Attends Archivist Meetings: Not on file  . Marital Status: Not on file    Tobacco Counseling Counseling given: Not Answered   Clinical Intake:  Pre-visit preparation completed: Yes  Pain : 0-10 Pain Score: 5  Pain Type: Chronic pain Pain Location: Shoulder Pain Orientation: Left, Right Pain Descriptors / Indicators: Aching Pain Onset: More than a month ago Pain Frequency: Intermittent Pain Relieving Factors: tylenol  Pain Relieving Factors: tylenol  Nutritional Risks: None Diabetes: No  How often do you need to have someone help you when you read instructions, pamphlets, or other written materials from your doctor or pharmacy?: 1 - Never What is the last grade level you completed in school?: 12th  Diabetic: No Nutrition Risk Assessment:  Has the patient had any N/V/D within the last 2 months?   No  Does the patient have any non-healing wounds?  No  Has the patient had any unintentional weight loss or weight gain?  No   Diabetes:  Is the patient diabetic?  No  If diabetic, was a CBG obtained today?  N/A Did the patient bring in their glucometer from home?  N/A How often do you monitor your CBG's? N/A.   Financial Strains and Diabetes Management:  Are you having any financial strains with the device, your supplies or your medication? N/A.  Does the patient want to be seen by Chronic Care Management for management of their diabetes?  N/A Would the patient like to be referred to a Nutritionist or for Diabetic Management?  N/A Interpreter Needed?: No  Information entered by :: CJohnson, LPN   Activities of Daily Living In your present state of health, do you have any difficulty performing the following activities: 01/23/2020  Hearing? N  Vision? N  Difficulty concentrating or making decisions? N  Walking or climbing stairs? N  Dressing or bathing? N  Doing errands, shopping? N  Preparing Food and eating ? N  Using the Toilet? N  In the past six months, have you accidently leaked urine? N  Do you have problems with loss of bowel control? N  Managing your Medications? N  Managing your Finances? N  Housekeeping or managing your Housekeeping? N  Some recent data might be hidden    Patient Care Team: [name], NP as PCP - General (Internal Medicine) Cletis Athens, MD (Cardiology) Kristeen Miss, MD (Neurosurgery)  Indicate any recent Medical Services you may have received from other than [facility] providers in the past year (date may be approximate).     Assessment:   This is a routine wellness examination for Samyah.  Hearing/Vision screen  Hearing Screening   125Hz  250Hz  500Hz  1000Hz  2000Hz  3000Hz  4000Hz  6000Hz  8000Hz   Right ear:           Left ear:           Vision Screening Comments: Patient gets annual eye exams   Dietary issues and exercise activities  discussed: Current Exercise Habits: The patient does not participate in regular exercise at present, Exercise limited by: None identified  Goals    . Patient Stated     01/23/2020, I will maintain and continue medications as prescribed.       Depression Screen PHQ 2/9 Scores 01/23/2020 01/25/2019 12/13/2017 04/07/2016 10/15/2015 09/25/2015 09/16/2015  PHQ - 2 Score 0 0 0 0 0 0 0  PHQ- 9 Score 0 - - - - - -    Fall Risk Fall Risk  01/23/2020 01/25/2019 01/25/2018 04/07/2016 10/15/2015  Falls in the past year? 1 - 0 Yes No  Comment - - Emmi Telephone Survey: data to providers prior to load - -  Number falls in past yr: 0 0 - 1 -  Injury with Fall? 1 0 - No -  Comment right shoulder hurting - - - -  Risk for fall due to : Medication side effect - - - -  Follow up Falls evaluation completed;Falls prevention discussed - - - -    Any stairs in or around the home? Yes  If so, are there any without handrails? No  Home free of loose throw rugs in walkways, pet beds, electrical cords, etc? Yes  Adequate lighting in your home to reduce risk of falls? Yes   ASSISTIVE DEVICES UTILIZED TO PREVENT FALLS:  Life alert? No  Use of a cane, walker or w/c? No  Grab bars in the bathroom? Yes  Shower chair or bench in shower? No  Elevated toilet seat or a handicapped toilet? No   TIMED UP AND GO:  Was the test performed? N/A, telephone visit .   Cognitive Function: MMSE - Mini Mental State Exam 01/23/2020  Orientation to time 5  Orientation to Place 5  Registration 3  Attention/ Calculation 5  Recall 3  Language- repeat 1       Mini Cog  Mini-Cog screen was completed. Maximum score is 22. A value of 0 denotes this part of the MMSE was not completed or the patient failed this part of the Mini-Cog screening.  Immunizations Immunization History  Administered Date(s) Administered  . Fluad Quad(high Dose 65+) 01/22/2019, 01/09/2020  . Influenza,inj,Quad PF,6+ Mos 11/06/2014  .  Influenza-Unspecified 01/07/2016, 12/13/2016  . PFIZER SARS-COV-2 Vaccination 04/19/2019, 05/08/2019  . Pneumococcal Conjugate-13 11/06/2014  . Pneumococcal Polysaccharide-23 04/07/2016  . Tdap 08/10/2011  . Zoster 09/06/2011  . Zoster Recombinat (Shingrix) 05/15/2018, 09/16/2018, 01/22/2019    TDAP status: Up to date Flu Vaccine status: Up to date Pneumococcal vaccine status: Up to date Covid-19 vaccine status: Completed vaccines  Qualifies for Shingles Vaccine? Yes   Zostavax completed Yes   Shingrix Completed?: Yes  Screening Tests Health Maintenance  Topic Date Due  . MAMMOGRAM  01/14/2021  . TETANUS/TDAP  08/09/2021  . COLONOSCOPY  04/06/2025  . INFLUENZA VACCINE  Completed  . DEXA SCAN  Completed  . COVID-19 Vaccine  Completed  . Hepatitis C Screening  Completed  . PNA vac Low Risk Adult  Completed    Health Maintenance  There are no preventive care reminders to display for this patient.  Colorectal cancer screening: Completed colonoscopy 04/07/2015. Repeat every 10 years Mammogram status: Completed 01/21/2020. Repeat every year Bone Density status: Completed 01/15/2019. Results reflect: Bone  density results: OSTEOPOROSIS. Repeat every 2 years.  Lung Cancer Screening: (Low Dose CT Chest recommended if Age 6-80 years, 30 pack-year currently smoking OR have quit w/in 15years.) does not qualify.    Additional Screening:  Hepatitis C Screening: does qualify; Completed 11/06/2014  Vision Screening: Recommended annual ophthalmology exams for early detection of glaucoma and other disorders of the eye. Is the patient up to date with their annual eye exam?  Yes  Who is the provider or what is the name of the office in which the patient attends annual eye exams? Dr. Edison Pace, [facility] If pt is not established with a provider, would they like to be referred to a provider to establish care? No .   Dental Screening: Recommended annual dental exams for proper oral  hygiene  Community Resource Referral / Chronic Care Management: CRR required this visit?  No   CCM required this visit?  No      Plan:     I have personally reviewed and noted the following in the patient's chart:   . Medical and social history . Use of alcohol, tobacco or illicit drugs  . Current medications and supplements . Functional ability and status . Nutritional status . Physical activity . Advanced directives . List of other physicians . Hospitalizations, surgeries, and ER visits in previous 12 months . Vitals . Screenings to include cognitive, depression, and falls . Referrals and appointments  In addition, I have reviewed and discussed with patient certain preventive protocols, quality metrics, and best practice recommendations. A written personalized care plan for preventive services as well as general preventive health recommendations were provided to patient.   Due to this being a telephonic visit, the after visit summary with patients personalized plan was offered to patient via office or my-chart. Patient preferred to pick up at office at next visit or via mychart.   Andrez Grime, LPN   [id number]

## 2020-01-23 NOTE — Progress Notes (Signed)
PCP notes:  Health Maintenance: No gaps noted   Abnormal Screenings: none   Patient concerns: Bilateral shoulder pain onset months ago    Nurse concerns: none   Next PCP appt.: 01/28/2020 @ 11:20 am

## 2020-01-24 DIAGNOSIS — H2513 Age-related nuclear cataract, bilateral: Secondary | ICD-10-CM | POA: Diagnosis not present

## 2020-01-25 NOTE — Telephone Encounter (Signed)
Labs normal, Ca normal and Cr Cl is 74.[place]

## 2020-01-28 ENCOUNTER — Encounter: Payer: Self-pay | Admitting: Primary Care

## 2020-01-28 ENCOUNTER — Ambulatory Visit (INDEPENDENT_AMBULATORY_CARE_PROVIDER_SITE_OTHER): Payer: Medicare Other | Admitting: Primary Care

## 2020-01-28 ENCOUNTER — Ambulatory Visit (INDEPENDENT_AMBULATORY_CARE_PROVIDER_SITE_OTHER)
Admission: RE | Admit: 2020-01-28 | Discharge: 2020-01-28 | Disposition: A | Discharge status: Home / Self Care | Payer: Medicare Other | Source: Ambulatory Visit | Attending: Primary Care | Admitting: Primary Care

## 2020-01-28 ENCOUNTER — Other Ambulatory Visit: Payer: Self-pay

## 2020-01-28 VITALS — BP 124/68 | HR 88 | Temp 96.8°F | Ht 61.0 in | Wt 131.0 lb

## 2020-01-28 DIAGNOSIS — M25511 Pain in right shoulder: Secondary | ICD-10-CM

## 2020-01-28 DIAGNOSIS — F32A Depression, unspecified: Secondary | ICD-10-CM | POA: Diagnosis not present

## 2020-01-28 DIAGNOSIS — G8929 Other chronic pain: Secondary | ICD-10-CM | POA: Diagnosis not present

## 2020-01-28 DIAGNOSIS — E785 Hyperlipidemia, unspecified: Secondary | ICD-10-CM | POA: Diagnosis not present

## 2020-01-28 DIAGNOSIS — F419 Anxiety disorder, unspecified: Secondary | ICD-10-CM

## 2020-01-28 DIAGNOSIS — D709 Neutropenia, unspecified: Secondary | ICD-10-CM | POA: Insufficient documentation

## 2020-01-28 DIAGNOSIS — D72819 Decreased white blood cell count, unspecified: Secondary | ICD-10-CM | POA: Diagnosis not present

## 2020-01-28 DIAGNOSIS — M810 Age-related osteoporosis without current pathological fracture: Secondary | ICD-10-CM

## 2020-01-28 MED ORDER — PRAVASTATIN SODIUM 40 MG PO TABS: 40.0000 mg | ORAL_TABLET | Freq: Every day | ORAL | 0 refills | Status: DC

## 2020-01-28 MED ORDER — FLUOXETINE HCL 20 MG PO TABS: 40.0000 mg | ORAL_TABLET | Freq: Every day | ORAL | 0 refills | Status: DC

## 2020-01-28 NOTE — Assessment & Plan Note (Signed)
Increased anxiety and stress over the year, suspect this is contributing to her forgetfulness.   She is already at max dose of Wellbutrin XL 300, continue same. Increase fluoxetine to 40 mg, she would like to keep the 20 mg dose but take two tablets. Refills provided today.  She will update.

## 2020-01-28 NOTE — Assessment & Plan Note (Signed)
Compliant to calcium, vitamin D, and Prolia injections. Repeat bone density scan in 2022.

## 2020-01-28 NOTE — Patient Instructions (Addendum)
Complete xray(s) prior to leaving today. I will notify you of your results once received.  Stop the rosuvastatin (Crestor) medication for cholesterol for 2 weeks. Start pravastatin if your joint ache symptoms resolved. Please update me.  We increased the dose of your fluoxetine to 40 mg. Take 2 capsules daily. Please update me.   Continue to work on a healthy diet and exercise regularly.   Set up a lab appointment for 2 months to repeat your blood counts.  It was a pleasure to see you today!

## 2020-01-28 NOTE — Assessment & Plan Note (Addendum)
Since falling in June 2021. Obvious decrease in ROM today. Checking plain films today given trauma.  Consider PT vs Sports Med eval.

## 2020-01-28 NOTE — Progress Notes (Signed)
Subjective:    Patient ID: Tracy Estrada, female    DOB: Sep 23, 1950, 69 y.o.   MRN: [id number]  HPI  This visit occurred during the SARS-CoV-2 public health emergency.  Safety protocols were in place, including screening questions prior to the visit, additional usage of staff PPE, and extensive cleaning of exam room while observing appropriate contact time as indicated for disinfecting solutions.   [name] is a 69 year old female who presents today for MVW Part 2 and follow up of health conditions.  She has noticed forgetfulness recently. She is under a lot of stress with her husband who has PTSD. She is compliant to her Wellbutrin XL 300 mg and Fluoxetine 20 mg, thinks she may benefit from a dose increase. She denies forgetting where she lives, leaving the stove on.   She would also like to discuss chronic right shoulder pain with decrease in ROM since a fall onto the right shoulder in June 2021. She was working in her kitchen, accidentally lost balance and fell onto her right side. Since then she's noticed continued decrease in ROM with posterior abduction and lateral abduction. She was never evaluated after the fall in June 2021.  She has noticed generalized joint aches, stiff in the morning when waking, improves throughout the day. She is managed on Crestor 5 mg.   Immunizations: -Tetanus: Completed in 2013 -Influenza: Completed this season  -Shingles: Completed Shingrix -Pneumonia: Completed Prevnar and Pneumovax -Covid-19: Completed series  Mammogram: Completed in 2021 at [facility].  Dexa: Completed in 2020, osteoporosis. Compliant to calcium and vitamin D. Also managed on Prolia.  Colonoscopy: Completed in 2017, due in 2027 Hep C Screen: Negative  BP Readings from Last 3 Encounters:  01/28/20 124/68  09/04/19 120/72  07/06/19 118/70      Review of Systems  Eyes: Negative for visual disturbance.  Respiratory: Negative for shortness of breath.   Cardiovascular: Negative  for chest pain.  Gastrointestinal: Negative for constipation and diarrhea.  Musculoskeletal: Positive for arthralgias.       Chronic right shoulder pain.  Neurological: Negative for headaches.  Psychiatric/Behavioral:       See HPI       Past Medical History:  Diagnosis Date  . Achilles bursitis or tendinitis   . Anxiety state, unspecified   . Arthritis   . Closed fracture of unspecified part of fibula   . Depressive disorder, not elsewhere classified   . Diverticulosis of colon (without mention of hemorrhage)   . Edema   . Fibromyalgia   . Insomnia, unspecified   . Lumbago   . Meniere's disease 1986  . MVP (mitral valve prolapse)   . Other intra-abdominal and pelvic swelling, mass and lump 06/01/2016  . Pain in joint, site unspecified   . Peptic ulcer    no testing  . Pure hypercholesterolemia   . TSH elevation      Social History   Socioeconomic History  . Marital status: Married    Spouse name: Tracy Estrada  . Number of children: 2  . Years of education: Not on file  . Highest education level: Not on file  Occupational History  . Occupation: retired    Comment: Network engineer  Tobacco Use  . Smoking status: Never Smoker  . Smokeless tobacco: Never Used  Substance and Sexual Activity  . Alcohol use: Yes    Alcohol/week: 0.0 standard drinks    Comment: 3-4 beers per week or wine  . Drug use: No  . Sexual activity:  Yes    Birth control/protection: Surgical  Other Topics Concern  . Not on file  Social History Narrative   Marital status:  Married x 14 years; second marriage;happily married,husband has PTSD.     Children: 2 daughters Tracy Estrada, Tracy Estrada); 7 grandchildren; 2 gg      Lives: with husband, dog/beagle      Employment: retired since 2013; Network engineer      Tobacco: never      Alcohol: weekends; 1-2 glasses of wine per week or beer.      Drugs:  None      Exercise: none in 2018      Seatbelt: 100%      Guns: unloaded.     Caffeine not every day. 7  grandchildren and 2 GG. Organ donor NO.      ADLs: independent with ADLs; drives      Advanced Directives: YES; FULL CODE no prolonged measures.   Social Determinants of Health   Financial Resource Strain: Low Risk   . Difficulty of Paying Living Expenses: Not hard at all  Food Insecurity: No Food Insecurity  . Worried About Charity fundraiser in the Last Year: Never true  . Ran Out of Food in the Last Year: Never true  Transportation Needs: No Transportation Needs  . Lack of Transportation (Medical): No  . Lack of Transportation (Non-Medical): No  Physical Activity: Inactive  . Days of Exercise per Week: 0 days  . Minutes of Exercise per Session: 0 min  Stress: No Stress Concern Present  . Feeling of Stress : Not at all  Social Connections:   . Frequency of Communication with Friends and Family: Not on file  . Frequency of Social Gatherings with Friends and Family: Not on file  . Attends Religious Services: Not on file  . Active Member of Clubs or Organizations: Not on file  . Attends Archivist Meetings: Not on file  . Marital Status: Not on file  Intimate Partner Violence: Not At Risk  . Fear of Current or Ex-Partner: No  . Emotionally Abused: No  . Physically Abused: No  . Sexually Abused: No    Past Surgical History:  Procedure Laterality Date  . ABDOMINAL HYSTERECTOMY  03/08/1980   fibroids; ovaries intact.  Marland Kitchen BACK SURGERY     x2 Cervical and lumbar  . CYSTOSCOPY N/A 06/08/2016   Procedure: CYSTOSCOPY;  Surgeon: Will Bonnet, MD;  Location: [facility];  Service: Gynecology;  Laterality: N/A;  . LAPAROSCOPIC LYSIS OF ADHESIONS  06/08/2016   Procedure: LAPAROSCOPIC LYSIS OF ADHESIONS;  Surgeon: Will Bonnet, MD;  Location: [facility];  Service: Gynecology;;  . LAPAROSCOPIC SALPINGO OOPHERECTOMY Left 06/08/2016   Procedure: LAPAROSCOPIC SALPINGO OOPHORECTOMY;  Surgeon: Will Bonnet, MD;  Location: [facility];  Service: Gynecology;  Laterality: Left;  .  OOPHORECTOMY     left ovary  . SALPINGECTOMY     left  . SPINE SURGERY     lumbar spine 11/2015; cervical spine 2009. [facility].    Family History  Problem Relation Age of Onset  . Hyperlipidemia Mother   . Hypertension Mother   . Colon polyps Mother   . Breast cancer Sister 22       with metastasis to bone; smoker  . Cancer Sister 14       breast cancer  . Coronary artery disease Father         AMI age 66  . Heart disease Father 38  AMI x 2; first AMI in 10s  . Stroke Maternal Grandmother   . Heart disease Paternal Grandfather   . Heart attack Paternal Grandfather   . Colon cancer Maternal Uncle        dx. >50  . Colon cancer Paternal Aunt        dx. 77s  . Heart attack Paternal Uncle   . Alzheimer's disease Paternal Grandmother   . Breast cancer Cousin   . Ovarian cancer Cousin        dx. late 70s/dx. 60s  . Lung cancer Cousin        dx. 36s  . Stomach cancer Other     Allergies  Allergen Reactions  . Demerol [Meperidine] Nausea And Vomiting  . Kenalog [Triamcinolone Acetonide] Other (See Comments)    Cause vertigo/dizziness  . Adhesive [Tape] Rash and Other (See Comments)    PAPER TAPE OK    Current Outpatient Medications on File Prior to Visit  Medication Sig Dispense Refill  . acetaminophen (TYLENOL) 325 MG tablet Take 650 mg by mouth every 6 (six) hours as needed.    Marland Kitchen Apoaequorin (PREVAGEN PO) Take by mouth.    . Ascorbic Acid (VITAMIN C PO) Take by mouth.    Marland Kitchen buPROPion (WELLBUTRIN XL) 150 MG 24 hr tablet Take 1-2 tablets (150-300 mg total) by mouth daily. For depression. 180 tablet 1  . Calcium Citrate (CITRACAL PO) Take 2 tablets by mouth 2 (two) times daily.     . Cholecalciferol (VITAMIN D3 PO) Take by mouth.    . diclofenac sodium (VOLTAREN) 1 % GEL Apply topically.    . fluticasone (FLONASE) 50 MCG/ACT nasal spray Place 2 sprays into the nose daily as needed for allergies.     . Multiple Vitamins-Minerals (MULTIVITAMIN WITH MINERALS) tablet Take  1 tablet by mouth daily. Centrum Silver for Women    . Omega-3 Fatty Acids (FISH OIL) 500 MG CAPS Take 500 mg by mouth daily.    Marland Kitchen POTASSIUM PO Take by mouth.     No current facility-administered medications on file prior to visit.    BP 124/68   Pulse 88   Temp (!) 96.8 F (36 C) (Temporal)   Ht 5\' 1"  (1.549 m)   Wt 131 lb (59.4 kg)   SpO2 100%   BMI 24.75 kg/m    Objective:   Physical Exam Cardiovascular:     Rate and Rhythm: Normal rate and regular rhythm.  Pulmonary:     Effort: Pulmonary effort is normal.     Breath sounds: Normal breath sounds.  Abdominal:     General: Abdomen is flat. Bowel sounds are normal.     Tenderness: There is no abdominal tenderness.  Musculoskeletal:     Right shoulder: No swelling, tenderness or bony tenderness. Decreased range of motion. Normal strength.       Arms:     Cervical back: Neck supple.     Comments: Decrease in ROM with pain in lateral abduction, posterior abduction. 5/5 strength bilaterally to upper extremities.   Skin:    General: Skin is warm and dry.  Neurological:     Mental Status: She is alert.            Assessment & Plan:

## 2020-01-28 NOTE — Assessment & Plan Note (Signed)
Well controlled but Crestor could be contributing to myalgias. Hold Crestor for 2 weeks, if resolved then will start pravastatin that was sent to her pharmacy today.   She will update.

## 2020-01-28 NOTE — Assessment & Plan Note (Signed)
Noted really since 2018 with largest decrease from labs recently, WBC count of 3.2.   Will monitor closely, start with repeating in 2 months.

## 2020-02-03 ENCOUNTER — Other Ambulatory Visit: Payer: Self-pay | Admitting: Primary Care

## 2020-02-03 DIAGNOSIS — F32A Depression, unspecified: Secondary | ICD-10-CM

## 2020-02-03 DIAGNOSIS — F419 Anxiety disorder, unspecified: Secondary | ICD-10-CM

## 2020-02-04 NOTE — Telephone Encounter (Signed)
Pharmacy requests refill on: Bupropion HCL XL 150 mg   LAST REFILL: 07/06/2019 (Q-180, R-1)  LAST OV: 01/28/2020 NEXT OV: Not Scheduled  PHARMACY: [facility] [place], [place]

## 2020-02-04 NOTE — Telephone Encounter (Signed)
Refills sent to pharmacy. 

## 2020-02-05 ENCOUNTER — Other Ambulatory Visit: Payer: Self-pay

## 2020-02-05 ENCOUNTER — Ambulatory Visit (INDEPENDENT_AMBULATORY_CARE_PROVIDER_SITE_OTHER): Payer: Medicare Other

## 2020-02-05 DIAGNOSIS — M810 Age-related osteoporosis without current pathological fracture: Secondary | ICD-10-CM | POA: Diagnosis not present

## 2020-02-05 MED ORDER — DENOSUMAB 60 MG/ML ~~LOC~~ SOSY
60.0000 mg | PREFILLED_SYRINGE | Freq: Once | SUBCUTANEOUS | Status: AC
  Administered 2020-02-05: 60 mg via SUBCUTANEOUS

## 2020-02-05 NOTE — Progress Notes (Signed)
Per orders of Alma Friendly, NP, injection of Prolia given by Diamond Nickel, RN.  Administered [place] to left arm.   Patient tolerated injection well.

## 2020-02-12 DIAGNOSIS — D72819 Decreased white blood cell count, unspecified: Secondary | ICD-10-CM

## 2020-02-21 ENCOUNTER — Other Ambulatory Visit: Payer: Self-pay | Admitting: Primary Care

## 2020-02-21 DIAGNOSIS — E785 Hyperlipidemia, unspecified: Secondary | ICD-10-CM

## 2020-03-17 ENCOUNTER — Inpatient Hospital Stay: Payer: Medicare Other | Attending: Oncology | Admitting: Oncology

## 2020-03-17 ENCOUNTER — Inpatient Hospital Stay: Payer: Medicare Other

## 2020-03-17 ENCOUNTER — Encounter: Payer: Self-pay | Admitting: Oncology

## 2020-03-17 VITALS — BP 150/71 | HR 84 | Resp 18 | Wt 134.3 lb

## 2020-03-17 DIAGNOSIS — F32A Depression, unspecified: Secondary | ICD-10-CM | POA: Diagnosis not present

## 2020-03-17 DIAGNOSIS — D72819 Decreased white blood cell count, unspecified: Secondary | ICD-10-CM | POA: Diagnosis not present

## 2020-03-17 DIAGNOSIS — F419 Anxiety disorder, unspecified: Secondary | ICD-10-CM | POA: Insufficient documentation

## 2020-03-17 DIAGNOSIS — R5383 Other fatigue: Secondary | ICD-10-CM | POA: Insufficient documentation

## 2020-03-17 DIAGNOSIS — M797 Fibromyalgia: Secondary | ICD-10-CM | POA: Insufficient documentation

## 2020-03-17 LAB — FOLATE: Folate: 26 ng/mL (ref >5.9)

## 2020-03-17 LAB — CBC WITH DIFFERENTIAL/PLATELET
Abs Immature Granulocytes: 0.02 10*3/uL (ref 0.00–0.07)
Basophils Absolute: 0 10*3/uL (ref 0.0–0.1)
Basophils Relative: 0 %
Eosinophils Absolute: 0.1 10*3/uL (ref 0.0–0.5)
Eosinophils Relative: 1 %
HCT: 39.8 % (ref 36.0–46.0)
Hemoglobin: 13.6 g/dL (ref 12.0–15.0)
Immature Granulocytes: 0 %
Lymphocytes Relative: 31 %
Lymphs Abs: 1.4 10*3/uL (ref 0.7–4.0)
MCH: 33.2 pg (ref 26.0–34.0)
MCHC: 34.2 g/dL (ref 30.0–36.0)
MCV: 97.1 fL (ref 80.0–100.0)
Monocytes Absolute: 0.4 10*3/uL (ref 0.1–1.0)
Monocytes Relative: 8 %
Neutro Abs: 2.8 10*3/uL (ref 1.7–7.7)
Neutrophils Relative %: 60 %
Platelets: 187 10*3/uL (ref 150–400)
RBC: 4.1 MIL/uL (ref 3.87–5.11)
RDW: 12.1 % (ref 11.5–15.5)
WBC: 4.7 10*3/uL (ref 4.0–10.5)
nRBC: 0 % (ref 0.0–0.2)

## 2020-03-17 LAB — HEPATITIS C ANTIBODY: HCV Ab: NONREACTIVE

## 2020-03-17 LAB — HIV ANTIBODY (ROUTINE TESTING W REFLEX): HIV Screen 4th Generation wRfx: NONREACTIVE

## 2020-03-17 LAB — TECHNOLOGIST SMEAR REVIEW: Plt Morphology: ADEQUATE

## 2020-03-17 LAB — VITAMIN B12: Vitamin B-12: 283 pg/mL (ref 180–914)

## 2020-03-17 NOTE — Progress Notes (Signed)
Hematology/Oncology Consult note [facility] [phone] [phone]  Patient Care Team: [name], NP as PCP - General (Internal Medicine) Cletis Athens, MD (Cardiology) Kristeen Miss, MD (Neurosurgery)   Name of the patient: Tracy Estrada  [id number]  09-Jan-1951    Reason for referral-leukopenia   Referring physician-Catherine Carlis Abbott, NP  Date of visit: 03/17/20   History of presenting illness-patient is a 70 year old female with a past medical history significant for anxiety depression and fibromyalgia.  She has been referred for leukopenia.  Most recent CBC 01/23/2020 showed white count of 3.2, H&H 13.9/41 with a platelet count of 156.  Reports feeling well overall and other than mild fatigue she denies other complaints.  Denies any lumps or bumps anywhere.  Denies any recurrent infections.  No new changes in her medications except her cholesterol drug.  ECOG PS- 1  Pain scale- 0   Review of systems- Review of Systems  Constitutional: Positive for malaise/fatigue. Negative for chills, fever and weight loss.  HENT: Negative for congestion, ear discharge and nosebleeds.   Eyes: Negative for blurred vision.  Respiratory: Negative for cough, hemoptysis, sputum production, shortness of breath and wheezing.   Cardiovascular: Negative for chest pain, palpitations, orthopnea and claudication.  Gastrointestinal: Negative for abdominal pain, blood in stool, constipation, diarrhea, heartburn, melena, nausea and vomiting.  Genitourinary: Negative for dysuria, flank pain, frequency, hematuria and urgency.  Musculoskeletal: Negative for back pain, joint pain and myalgias.  Skin: Negative for rash.  Neurological: Negative for dizziness, tingling, focal weakness, seizures, weakness and headaches.  Endo/Heme/Allergies: Does not bruise/bleed easily.  Psychiatric/Behavioral: Negative for depression and suicidal ideas. The patient does not  have insomnia.     Allergies  Allergen Reactions  . Demerol [Meperidine] Nausea And Vomiting  . Kenalog [Triamcinolone Acetonide] Other (See Comments)    Cause vertigo/dizziness  . Adhesive [Tape] Rash and Other (See Comments)    PAPER TAPE OK    Patient Active Problem List   Diagnosis Date Noted  . Leukopenia 01/28/2020  . Chronic right shoulder pain 01/28/2020  . Foul smelling urine 09/04/2019  . Medicare annual wellness visit, subsequent 01/25/2019  . Hyperlipidemia 01/25/2019  . Osteoporosis 01/25/2019  . Rash and nonspecific skin eruption 04/13/2018  . Fibromyalgia 09/16/2015  . Anxiety and depression 09/16/2015  . Insomnia 09/16/2015  . Genetic testing 03/21/2015  . Family history of breast cancer in sister 02/27/2015  . Family history of ovarian cancer 02/27/2015  . Family history of colon cancer 02/27/2015     Past Medical History:  Diagnosis Date  . Achilles bursitis or tendinitis   . Anxiety state, unspecified   . Arthritis   . Closed fracture of unspecified part of fibula   . Depressive disorder, not elsewhere classified   . Diverticulosis of colon (without mention of hemorrhage)   . Edema   . Fibromyalgia   . Insomnia, unspecified   . Lumbago   . Mass of left ovary 06/01/2016  . Meniere's disease 1986  . MVP (mitral valve prolapse)   . Other intra-abdominal and pelvic swelling, mass and lump 06/01/2016  . Pain in joint, site unspecified   . Peptic ulcer    no testing  . Pure hypercholesterolemia   . TSH elevation      Past Surgical History:  Procedure Laterality Date  . ABDOMINAL HYSTERECTOMY  03/08/1980   fibroids; ovaries intact.  Marland Kitchen BACK SURGERY     x2 Cervical and lumbar  . CYSTOSCOPY N/A 06/08/2016  Procedure: CYSTOSCOPY;  Surgeon: Will Bonnet, MD;  Location: [facility];  Service: Gynecology;  Laterality: N/A;  . LAPAROSCOPIC LYSIS OF ADHESIONS  06/08/2016   Procedure: LAPAROSCOPIC LYSIS OF ADHESIONS;  Surgeon: Will Bonnet, MD;   Location: [facility];  Service: Gynecology;;  . LAPAROSCOPIC SALPINGO OOPHERECTOMY Left 06/08/2016   Procedure: LAPAROSCOPIC SALPINGO OOPHORECTOMY;  Surgeon: Will Bonnet, MD;  Location: [facility];  Service: Gynecology;  Laterality: Left;  . OOPHORECTOMY     left ovary  . SALPINGECTOMY     left  . SPINE SURGERY     lumbar spine 11/2015; cervical spine 2009. [facility].    Social History   Socioeconomic History  . Marital status: Married    Spouse name: Oluwadarasimi Favor  . Number of children: 2  . Years of education: Not on file  . Highest education level: Not on file  Occupational History  . Occupation: retired    Comment: Network engineer  Tobacco Use  . Smoking status: Never Smoker  . Smokeless tobacco: Never Used  Substance and Sexual Activity  . Alcohol use: Yes    Alcohol/week: 0.0 standard drinks    Comment: 3-4 beers per week or wine  . Drug use: No  . Sexual activity: Yes    Birth control/protection: Surgical  Other Topics Concern  . Not on file  Social History Narrative   Marital status:  Married x 14 years; second marriage;happily married,husband has PTSD.     Children: 2 daughters [name], [name]); 7 grandchildren; 2 gg      Lives: with husband, dog/beagle      Employment: retired since 2013; Network engineer      Tobacco: never      Alcohol: weekends; 1-2 glasses of wine per week or beer.      Drugs:  None      Exercise: none in 2018      Seatbelt: 100%      Guns: unloaded.     Caffeine not every day. 7 grandchildren and 2 GG. Organ donor NO.      ADLs: independent with ADLs; drives      Advanced Directives: YES; FULL CODE no prolonged measures.   Social Determinants of Health   Financial Resource Strain: Low Risk   . Difficulty of Paying Living Expenses: Not hard at all  Food Insecurity: No Food Insecurity  . Worried About Charity fundraiser in the Last Year: Never true  . Ran Out of Food in the Last Year: Never true  Transportation Needs: No Transportation Needs  . Lack  of Transportation (Medical): No  . Lack of Transportation (Non-Medical): No  Physical Activity: Inactive  . Days of Exercise per Week: 0 days  . Minutes of Exercise per Session: 0 min  Stress: No Stress Concern Present  . Feeling of Stress : Not at all  Social Connections: Not on file  Intimate Partner Violence: Not At Risk  . Fear of Current or Ex-Partner: No  . Emotionally Abused: No  . Physically Abused: No  . Sexually Abused: No     Family History  Problem Relation Age of Onset  . Hyperlipidemia Mother   . Hypertension Mother   . Colon polyps Mother   . Breast cancer Sister 35       with metastasis to bone; smoker  . Cancer Sister 38       breast cancer  . Coronary artery disease Father         AMI age 6  .  Heart disease Father 57       AMI x 2; first AMI in 68s  . Stroke Maternal Grandmother   . Heart disease Paternal Grandfather   . Heart attack Paternal Grandfather   . Colon cancer Maternal Uncle        dx. >50  . Colon cancer Paternal Aunt        dx. 74s  . Heart attack Paternal Uncle   . Alzheimer's disease Paternal Grandmother   . Breast cancer Cousin   . Ovarian cancer Cousin        dx. late 70s/dx. 60s  . Lung cancer Cousin        dx. 58s  . Stomach cancer Other      Current Outpatient Medications:  .  acetaminophen (TYLENOL) 325 MG tablet, Take 650 mg by mouth every 6 (six) hours as needed., Disp: , Rfl:  .  Apoaequorin (PREVAGEN PO), Take by mouth., Disp: , Rfl:  .  Ascorbic Acid (VITAMIN C PO), Take by mouth., Disp: , Rfl:  .  buPROPion (WELLBUTRIN XL) 150 MG 24 hr tablet, Take 2 tablets (300 mg total) by mouth daily. For depression., Disp: 180 tablet, Rfl: 3 .  Calcium Citrate (CITRACAL PO), Take 2 tablets by mouth 2 (two) times daily. , Disp: , Rfl:  .  Cholecalciferol (VITAMIN D3 PO), Take by mouth., Disp: , Rfl:  .  diclofenac sodium (VOLTAREN) 1 % GEL, Apply topically., Disp: , Rfl:  .  FLUoxetine (PROZAC) 20 MG tablet, Take 2 tablets (40 mg  total) by mouth daily. For anxiety and depression., Disp: 180 tablet, Rfl: 0 .  fluticasone (FLONASE) 50 MCG/ACT nasal spray, Place 2 sprays into the nose daily as needed for allergies. , Disp: , Rfl:  .  Multiple Vitamins-Minerals (MULTIVITAMIN WITH MINERALS) tablet, Take 1 tablet by mouth daily. Centrum Silver for Women, Disp: , Rfl:  .  Omega-3 Fatty Acids (FISH OIL) 500 MG CAPS, Take 500 mg by mouth daily., Disp: , Rfl:  .  POTASSIUM PO, Take by mouth., Disp: , Rfl:    Physical exam:  Vitals:   03/17/20 1330  BP: (!) 150/71  Pulse: 84  Resp: 18  SpO2: 97%  Weight: 134 lb 4.8 oz (60.9 kg)   Physical Exam Eyes:     Extraocular Movements: EOM normal.  Cardiovascular:     Rate and Rhythm: Normal rate and regular rhythm.     Heart sounds: Normal heart sounds.  Pulmonary:     Effort: Pulmonary effort is normal.     Breath sounds: Normal breath sounds.  Abdominal:     General: Bowel sounds are normal.     Palpations: Abdomen is soft.     Comments: No palpable hepatosplenomegaly  Musculoskeletal:     Cervical back: Normal range of motion.  Lymphadenopathy:     Comments: No palpable cervical, supraclavicular, axillary or inguinal adenopathy   Skin:    General: Skin is warm and dry.  Neurological:     Mental Status: She is alert and oriented to person, place, and time.        CMP Latest Ref Rng & Units 01/23/2020  Glucose 70 - 99 mg/dL 89  BUN 6 - 23 mg/dL 26(H)  Creatinine 0.40 - 1.20 mg/dL 0.64  Sodium 135 - 145 mEq/L 141  Potassium 3.5 - 5.1 mEq/L 4.0  Chloride 96 - 112 mEq/L 104  CO2 19 - 32 mEq/L 32  Calcium 8.4 - 10.5 mg/dL 9.8  Total Protein 6.0 -  8.3 g/dL 7.2  Total Bilirubin 0.2 - 1.2 mg/dL 0.5  Alkaline Phos 39 - 117 U/L 38(L)  AST 0 - 37 U/L 19  ALT 0 - 35 U/L 18   CBC Latest Ref Rng & Units 03/17/2020  WBC 4.0 - 10.5 K/uL 4.7  Hemoglobin 12.0 - 15.0 g/dL 13.6  Hematocrit 36.0 - 46.0 % 39.8  Platelets 150 - 400 K/uL 187     Assessment and plan-  Patient is a 70 y.o. female referred for leukopenia   Looking back at patient's prior CBC is at baseline her white count typically runs between 3.8-7.3.  Her white count was noted to be 3.9 in November 2020 and 3.2 in November 2021.  Differential was not done.  Hemoglobin and platelets were normal.  I will check a CBC with differential, smear review, flow cytometry, B12 folate, HIV and hepatitis C testing.  Given the patient has isolated leukopenia in the absence of other cytopenias I do not think the patient requires a bone marrow biopsy.  I will see her back in 2 weeks to discuss results of her blood work and further management  Thank you for this kind referral and the opportunity to participate in the care of this patient   Visit Diagnosis 1. Leukopenia, unspecified type     Dr. Randa Evens, MD, MPH [facility] [phone] 03/17/2020  4:17 PM

## 2020-03-19 LAB — COMP PANEL: LEUKEMIA/LYMPHOMA

## 2020-03-26 ENCOUNTER — Other Ambulatory Visit: Payer: Self-pay | Admitting: Primary Care

## 2020-03-26 DIAGNOSIS — E785 Hyperlipidemia, unspecified: Secondary | ICD-10-CM

## 2020-04-03 ENCOUNTER — Inpatient Hospital Stay (HOSPITAL_BASED_OUTPATIENT_CLINIC_OR_DEPARTMENT_OTHER): Payer: Medicare Other | Admitting: Oncology

## 2020-04-03 DIAGNOSIS — D72819 Decreased white blood cell count, unspecified: Secondary | ICD-10-CM

## 2020-04-03 NOTE — Progress Notes (Signed)
Patient  Wanted to ask if there was anything she could do to build her immune system

## 2020-04-04 NOTE — Progress Notes (Signed)
I connected with Tracy Estrada on 04/04/20 at  2:00 PM EST by video enabled telemedicine visit and verified that I am speaking with the correct person using two identifiers.   I discussed the limitations, risks, security and privacy concerns of performing an evaluation and management service by telemedicine and the availability of in-person appointments. I also discussed with the patient that there may be a patient responsible charge related to this service. The patient expressed understanding and agreed to proceed.  Other persons participating in the visit and their role in the encounter:  none  Patient's location:  home Provider's location:  work  Risk analyst Complaint: Discuss results of blood work  History of present illness: patient is a 70 year old female with a past medical history significant for anxiety depression and fibromyalgia.  She has been referred for leukopenia.  Most recent CBC 01/23/2020 showed white count of 3.2, H&H 13.9/41 with a platelet count of 156.  Reports feeling well overall and other than mild fatigue she denies other complaints.  Denies any lumps or bumps anywhere.  Denies any recurrent infections.  No new changes in her medications except her cholesterol drug.  Results of blood work from 03/17/2020 were as follows:  CBC showed white cell count of 4.7, H&H of 13.6/29.8 with an MCV of 97.1 and a platelet count of 187.  B12 levels were low at 283.  Folate levels were normal.  HIV and hepatitis C testing negative.  Smear review unremarkable and flow cytometry did not show any evidence of immunophenotypic abnormality.    Interval history patient reports doing well and denies any complaints at this time   Review of Systems  Constitutional: Negative for chills, fever, malaise/fatigue and weight loss.  HENT: Negative for congestion, ear discharge and nosebleeds.   Eyes: Negative for blurred vision.  Respiratory: Negative for cough, hemoptysis, sputum production, shortness of  breath and wheezing.   Cardiovascular: Negative for chest pain, palpitations, orthopnea and claudication.  Gastrointestinal: Negative for abdominal pain, blood in stool, constipation, diarrhea, heartburn, melena, nausea and vomiting.  Genitourinary: Negative for dysuria, flank pain, frequency, hematuria and urgency.  Musculoskeletal: Negative for back pain, joint pain and myalgias.  Skin: Negative for rash.  Neurological: Negative for dizziness, tingling, focal weakness, seizures, weakness and headaches.  Endo/Heme/Allergies: Does not bruise/bleed easily.  Psychiatric/Behavioral: Negative for depression and suicidal ideas. The patient does not have insomnia.     Allergies  Allergen Reactions  . Demerol [Meperidine] Nausea And Vomiting  . Kenalog [Triamcinolone Acetonide] Other (See Comments)    Cause vertigo/dizziness  . Adhesive [Tape] Rash and Other (See Comments)    PAPER TAPE OK    Past Medical History:  Diagnosis Date  . Achilles bursitis or tendinitis   . Anxiety state, unspecified   . Arthritis   . Closed fracture of unspecified part of fibula   . Depressive disorder, not elsewhere classified   . Diverticulosis of colon (without mention of hemorrhage)   . Edema   . Fibromyalgia   . Insomnia, unspecified   . Lumbago   . Mass of left ovary 06/01/2016  . Meniere's disease 1986  . MVP (mitral valve prolapse)   . Other intra-abdominal and pelvic swelling, mass and lump 06/01/2016  . Pain in joint, site unspecified   . Peptic ulcer    no testing  . Pure hypercholesterolemia   . TSH elevation     Past Surgical History:  Procedure Laterality Date  . ABDOMINAL HYSTERECTOMY  03/08/1980   fibroids; ovaries  intact.  Marland Kitchen BACK SURGERY     x2 Cervical and lumbar  . CYSTOSCOPY N/A 06/08/2016   Procedure: CYSTOSCOPY;  Surgeon: Will Bonnet, MD;  Location: [facility];  Service: Gynecology;  Laterality: N/A;  . LAPAROSCOPIC LYSIS OF ADHESIONS  06/08/2016   Procedure: LAPAROSCOPIC  LYSIS OF ADHESIONS;  Surgeon: Will Bonnet, MD;  Location: [facility];  Service: Gynecology;;  . LAPAROSCOPIC SALPINGO OOPHERECTOMY Left 06/08/2016   Procedure: LAPAROSCOPIC SALPINGO OOPHORECTOMY;  Surgeon: Will Bonnet, MD;  Location: [facility];  Service: Gynecology;  Laterality: Left;  . OOPHORECTOMY     left ovary  . SALPINGECTOMY     left  . SPINE SURGERY     lumbar spine 11/2015; cervical spine 2009. [facility].    Social History   Socioeconomic History  . Marital status: Married    Spouse name: Kao Berkheimer  . Number of children: 2  . Years of education: Not on file  . Highest education level: Not on file  Occupational History  . Occupation: retired    Comment: Network engineer  Tobacco Use  . Smoking status: Never Smoker  . Smokeless tobacco: Never Used  Substance and Sexual Activity  . Alcohol use: Yes    Alcohol/week: 0.0 standard drinks    Comment: 3-4 beers per week or wine  . Drug use: No  . Sexual activity: Yes    Birth control/protection: Surgical  Other Topics Concern  . Not on file  Social History Narrative   Marital status:  Married x 14 years; second marriage;happily married,husband has PTSD.     Children: 2 daughters [name], [name]); 7 grandchildren; 2 gg      Lives: with husband, dog/beagle      Employment: retired since 2013; Network engineer      Tobacco: never      Alcohol: weekends; 1-2 glasses of wine per week or beer.      Drugs:  None      Exercise: none in 2018      Seatbelt: 100%      Guns: unloaded.     Caffeine not every day. 7 grandchildren and 2 GG. Organ donor NO.      ADLs: independent with ADLs; drives      Advanced Directives: YES; FULL CODE no prolonged measures.   Social Determinants of Health   Financial Resource Strain: Low Risk   . Difficulty of Paying Living Expenses: Not hard at all  Food Insecurity: No Food Insecurity  . Worried About Charity fundraiser in the Last Year: Never true  . Ran Out of Food in the Last Year: Never true   Transportation Needs: No Transportation Needs  . Lack of Transportation (Medical): No  . Lack of Transportation (Non-Medical): No  Physical Activity: Inactive  . Days of Exercise per Week: 0 days  . Minutes of Exercise per Session: 0 min  Stress: No Stress Concern Present  . Feeling of Stress : Not at all  Social Connections: Not on file  Intimate Partner Violence: Not At Risk  . Fear of Current or Ex-Partner: No  . Emotionally Abused: No  . Physically Abused: No  . Sexually Abused: No    Family History  Problem Relation Age of Onset  . Hyperlipidemia Mother   . Hypertension Mother   . Colon polyps Mother   . Breast cancer Sister 40       with metastasis to bone; smoker  . Cancer Sister 40       breast cancer  .  Coronary artery disease Father         AMI age 64  . Heart disease Father 81       AMI x 2; first AMI in 82s  . Stroke Maternal Grandmother   . Heart disease Paternal Grandfather   . Heart attack Paternal Grandfather   . Colon cancer Maternal Uncle        dx. >50  . Colon cancer Paternal Aunt        dx. 70s  . Heart attack Paternal Uncle   . Alzheimer's disease Paternal Grandmother   . Breast cancer Cousin   . Ovarian cancer Cousin        dx. late 70s/dx. 60s  . Lung cancer Cousin        dx. 73s  . Stomach cancer Other      Current Outpatient Medications:  .  acetaminophen (TYLENOL) 325 MG tablet, Take 650 mg by mouth every 6 (six) hours as needed., Disp: , Rfl:  .  Apoaequorin (PREVAGEN PO), Take by mouth., Disp: , Rfl:  .  Ascorbic Acid (VITAMIN C PO), Take by mouth., Disp: , Rfl:  .  buPROPion (WELLBUTRIN XL) 150 MG 24 hr tablet, Take 2 tablets (300 mg total) by mouth daily. For depression., Disp: 180 tablet, Rfl: 3 .  Calcium Citrate (CITRACAL PO), Take 2 tablets by mouth 2 (two) times daily. , Disp: , Rfl:  .  Cholecalciferol (VITAMIN D3 PO), Take by mouth., Disp: , Rfl:  .  diclofenac sodium (VOLTAREN) 1 % GEL, Apply topically., Disp: , Rfl:  .   FLUoxetine (PROZAC) 20 MG tablet, Take 2 tablets (40 mg total) by mouth daily. For anxiety and depression., Disp: 180 tablet, Rfl: 0 .  fluticasone (FLONASE) 50 MCG/ACT nasal spray, Place 2 sprays into the nose daily as needed for allergies. , Disp: , Rfl:  .  Multiple Vitamins-Minerals (MULTIVITAMIN WITH MINERALS) tablet, Take 1 tablet by mouth daily. Centrum Silver for Women, Disp: , Rfl:  .  Omega-3 Fatty Acids (FISH OIL) 500 MG CAPS, Take 500 mg by mouth daily., Disp: , Rfl:  .  POTASSIUM PO, Take by mouth., Disp: , Rfl:   No results found.  No images are attached to the encounter.   CMP Latest Ref Rng & Units 01/23/2020  Glucose 70 - 99 mg/dL 89  BUN 6 - 23 mg/dL 26(H)  Creatinine 0.40 - 1.20 mg/dL 0.64  Sodium 135 - 145 mEq/L 141  Potassium 3.5 - 5.1 mEq/L 4.0  Chloride 96 - 112 mEq/L 104  CO2 19 - 32 mEq/L 32  Calcium 8.4 - 10.5 mg/dL 9.8  Total Protein 6.0 - 8.3 g/dL 7.2  Total Bilirubin 0.2 - 1.2 mg/dL 0.5  Alkaline Phos 39 - 117 U/L 38(L)  AST 0 - 37 U/L 19  ALT 0 - 35 U/L 18   CBC Latest Ref Rng & Units 03/17/2020  WBC 4.0 - 10.5 K/uL 4.7  Hemoglobin 12.0 - 15.0 g/dL 13.6  Hematocrit 36.0 - 46.0 % 39.8  Platelets 150 - 400 K/uL 187     Observation/objective: Appears in no acute distress over video visit today.  Breathing is nonlabored  Assessment and plan: Patient is a 70 year old female referred for leukopenia/neutropenia that was intermittent  On repeat blood work her neutropenia had resolved.  HIV and hepatitis C testing negative.  Flow cytometry and smear review unremarkable.  B12 levels were mildly low at 283 and have asked her to take oral B12 1000 mcg daily.  She does not require any bone marrow biopsy at this time and can be monitored conservatively.  Follow-up instructions: Repeat CBC with differential in 6 months followed by video visit  I discussed the assessment and treatment plan with the patient. The patient was provided an opportunity to ask  questions and all were answered. The patient agreed with the plan and demonstrated an understanding of the instructions.   The patient was advised to call back or seek an in-person evaluation if the symptoms worsen or if the condition fails to improve as anticipated.   Visit Diagnosis: 1. Leukopenia, unspecified type     Dr. Randa Evens, MD, MPH [facility] [address] at [facility] Tel- [phone] 04/04/2020 3:13 PM

## 2020-04-30 ENCOUNTER — Other Ambulatory Visit: Payer: Self-pay | Admitting: Primary Care

## 2020-04-30 DIAGNOSIS — E785 Hyperlipidemia, unspecified: Secondary | ICD-10-CM

## 2020-05-16 DIAGNOSIS — H0011 Chalazion right upper eyelid: Secondary | ICD-10-CM | POA: Diagnosis not present

## 2020-05-19 ENCOUNTER — Telehealth: Payer: Self-pay

## 2020-05-19 DIAGNOSIS — M810 Age-related osteoporosis without current pathological fracture: Secondary | ICD-10-CM

## 2020-05-19 NOTE — Telephone Encounter (Signed)
Benefit verification submitted-pending decision Last injection was on 02/05/2020 Next injection after 08/05/2020

## 2020-05-26 NOTE — Telephone Encounter (Signed)
Spoke with patient and informed her that she will not have to pay anything OOP. Patient verbalized understanding. Patient stated that she will have to call back to schedule appointment.

## 2020-05-26 NOTE — Telephone Encounter (Signed)
Benefit verification received. No PA. OOP cost is $0. Please schedule patient for lab and NV appointment.

## 2020-06-17 DIAGNOSIS — H0011 Chalazion right upper eyelid: Secondary | ICD-10-CM | POA: Diagnosis not present

## 2020-06-18 NOTE — Telephone Encounter (Signed)
Left message for patient to call me back. 

## 2020-06-19 NOTE — Telephone Encounter (Signed)
Pt returned call. Scheduled labs for 07/29/20 and prolia for 08/06/20.

## 2020-07-29 ENCOUNTER — Other Ambulatory Visit: Payer: Medicare Other

## 2020-08-06 ENCOUNTER — Ambulatory Visit: Payer: Medicare Other

## 2020-08-21 NOTE — Telephone Encounter (Signed)
Noted. Already called and left message for the patient earlier. Waiting to hear back

## 2020-08-21 NOTE — Telephone Encounter (Signed)
Patient cancelled lab and NV appointment for Prolia. I called and left message for patient to call back to schedule.

## 2020-08-21 NOTE — Telephone Encounter (Signed)
I don't see an issue with this diagnosis. Also her last white blood cell count was normal.  It seems like she keeps cancelling her injection appt, does she want to try something different for osteoporosis treatment?   It looks like she was once on Fosamax (alendronate).

## 2020-08-21 NOTE — Telephone Encounter (Signed)
Tracy Estrada, I wanted to verify if patient is ok to receive Prolia with diagnoses of Leukopenia? Thank you

## 2020-08-22 NOTE — Addendum Note (Signed)
Addended by: Kris Mouton on: 08/22/2020 03:54 PM   Modules accepted: Orders

## 2020-08-22 NOTE — Telephone Encounter (Signed)
Spoke with patient and rescheduled for lab on 09/05/20 and NV on 09/10/20  Patient had to cancel her last appointments due to death in the family and then her brother had a stroke.

## 2020-08-26 DIAGNOSIS — M25511 Pain in right shoulder: Secondary | ICD-10-CM

## 2020-08-26 DIAGNOSIS — M797 Fibromyalgia: Secondary | ICD-10-CM

## 2020-08-26 DIAGNOSIS — M810 Age-related osteoporosis without current pathological fracture: Secondary | ICD-10-CM

## 2020-08-26 DIAGNOSIS — M2550 Pain in unspecified joint: Secondary | ICD-10-CM

## 2020-08-26 DIAGNOSIS — G8929 Other chronic pain: Secondary | ICD-10-CM

## 2020-08-28 NOTE — Progress Notes (Signed)
Office Visit Note  Patient: Tracy Estrada             Date of Birth: 1950/06/26           MRN: [id number]             PCP: [name], NP Referring: [name], NP Visit Date: 09/09/2020 Occupation: _0 @  Subjective:  Pain in multiple joints   History of Present Illness: Tracy Estrada is a 70 y.o. female seen in consultation per request of her PCP.  She was seen by me last in 2016.  She has longstanding history of fibromyalgia, osteoarthritis and degenerative disc disease.  She has had viscosupplementation junctions in her knee joints by me in the past.  She is also had chronic shoulder joint pain.  She was referred to physical therapy in the past.  She has history of disc disease of cervical and lumbar spine.  She has had cortisone injection to her left shoulder joint in October 2016 by me.  According to the patient in June 2021 while she was at her [facility] she tripped over the dishwasher door and landed on her right shoulder and her head.  She states that shoulder pain persisted in November 2021 she was seen by her PCP.  At that time she had x-ray of her right shoulder joint and was told that she has a spur.  She states the pain in her shoulders has been getting worse over time and now both shoulders are painful.  She continues to have some discomfort in her right hand which she describes over the right [facility] joint and her bilateral knee joints.  She was diagnosed with osteoarthritis of bilateral knee joints in the past and had cortisone and Visco supplement injections.  She has difficulty with squatting.  She also gives history of neck and lower back pain.  She has known history of fusion of cervical spine and fusion of lumbar spine by Dr. Ellene Route.  She states her lower back has been hurting more than usual and she has an appointment coming up with Dr. Ellene Route.  None of the joints are swollen currently.  Patient Reports nocturnal pain.  Difficulty dressing/grooming:  Reports Difficulty climbing stairs: Reports Difficulty getting out of chair: Reports Difficulty using hands for taps, buttons, cutlery, and/or writing: Reports  Review of Systems  Constitutional:  Positive for fatigue.  HENT:  Negative for mouth sores, mouth dryness and nose dryness.   Eyes:  Positive for dryness. Negative for pain and itching.  Respiratory:  Negative for shortness of breath and difficulty breathing.   Cardiovascular:  Positive for palpitations. Negative for chest pain.  Gastrointestinal:  Positive for constipation. Negative for blood in stool and diarrhea.  Endocrine: Negative for increased urination.  Genitourinary:  Negative for difficulty urinating.  Musculoskeletal:  Positive for joint pain, joint pain, myalgias, morning stiffness, muscle tenderness and myalgias. Negative for joint swelling.  Skin:  Negative for color change, rash and sensitivity to sunlight.  Allergic/Immunologic: Negative for susceptible to infections.  Neurological:  Positive for memory loss and weakness. Negative for dizziness, numbness and headaches.  Hematological:  Positive for bruising/bleeding tendency.  Psychiatric/Behavioral:  Positive for depressed mood and sleep disturbance. Negative for confusion. The patient is nervous/anxious.    PMFS History:  Patient Active Problem List   Diagnosis Date Noted   Leukopenia 01/28/2020   Chronic right shoulder pain 01/28/2020   Foul smelling urine 09/04/2019   Medicare annual wellness visit, subsequent 01/25/2019  Hyperlipidemia 01/25/2019   Osteoporosis 01/25/2019   Rash and nonspecific skin eruption 04/13/2018   Fibromyalgia 09/16/2015   Anxiety and depression 09/16/2015   Insomnia 09/16/2015   Genetic testing 03/21/2015   Family history of breast cancer in sister 02/27/2015   Family history of ovarian cancer 02/27/2015   Family history of colon cancer 02/27/2015    Past Medical History:  Diagnosis Date   Achilles bursitis or tendinitis     Anxiety state, unspecified    Arthritis    Closed fracture of unspecified part of fibula    Depressive disorder, not elsewhere classified    Diverticulosis of colon (without mention of hemorrhage)    Edema    Fibromyalgia    Insomnia, unspecified    Lumbago    Mass of left ovary 06/01/2016   Meniere's disease 1986   MVP (mitral valve prolapse)    Other intra-abdominal and pelvic swelling, mass and lump 06/01/2016   Pain in joint, site unspecified    Peptic ulcer    no testing   Pure hypercholesterolemia    TSH elevation     Family History  Problem Relation Age of Onset   Hyperlipidemia Mother    Hypertension Mother    Colon polyps Mother    Coronary artery disease Father         AMI age 31   Heart disease Father 26       AMI x 2; first AMI in 19s   Breast cancer Sister 87       with metastasis to bone; smoker   Cancer Sister 75       breast cancer   Colon cancer Maternal Uncle        dx. >50   Colon cancer Paternal Aunt        dx. 70s   Heart attack Paternal Uncle    Stroke Maternal Grandmother    Alzheimer's disease Paternal Grandmother    Heart disease Paternal Grandfather    Heart attack Paternal Grandfather    Healthy Daughter    Healthy Daughter    Breast cancer Cousin    Ovarian cancer Cousin        dx. late 70s/dx. 16s   Lung cancer Cousin        dx. 70s   Stomach cancer Other    Past Surgical History:  Procedure Laterality Date   ABDOMINAL HYSTERECTOMY  03/08/1980   fibroids; ovaries intact.   BACK SURGERY     x2 Cervical and lumbar   CYSTOSCOPY N/A 06/08/2016   Procedure: CYSTOSCOPY;  Surgeon: Will Bonnet, MD;  Location: [facility] ORS;  Service: Gynecology;  Laterality: N/A;   LAPAROSCOPIC LYSIS OF ADHESIONS  06/08/2016   Procedure: LAPAROSCOPIC LYSIS OF ADHESIONS;  Surgeon: Will Bonnet, MD;  Location: [facility] ORS;  Service: Gynecology;;   LAPAROSCOPIC SALPINGO OOPHERECTOMY Left 06/08/2016   Procedure: LAPAROSCOPIC SALPINGO OOPHORECTOMY;  Surgeon:  Will Bonnet, MD;  Location: [facility] ORS;  Service: Gynecology;  Laterality: Left;   OOPHORECTOMY     left ovary   SALPINGECTOMY     left   SPINE SURGERY     lumbar spine 11/2015; cervical spine 2009. [facility]ner.   Social History   Social History Narrative   Marital status:  Married x 14 years; second marriage;happily married,husband has PTSD.     Children: 2 daughters [name], [name]); 7 grandchildren; 2 gg      Lives: with husband, dog/beagle      Employment: retired since 2013; Network engineer  Tobacco: never      Alcohol: weekends; 1-2 glasses of wine per week or beer.      Drugs:  None      Exercise: none in 2018      Seatbelt: 100%      Guns: unloaded.     Caffeine not every day. 7 grandchildren and 2 GG. Organ donor NO.      ADLs: independent with ADLs; drives      Advanced Directives: YES; FULL CODE no prolonged measures.   Immunization History  Administered Date(s) Administered   Fluad Quad(high Dose 65+) 01/22/2019, 01/09/2020   Influenza,inj,Quad PF,6+ Mos 11/06/2014   Influenza-Unspecified 01/07/2016, 12/13/2016   PFIZER(Purple Top)SARS-COV-2 Vaccination 04/19/2019, 05/08/2019   Pneumococcal Conjugate-13 11/06/2014   Pneumococcal Polysaccharide-23 04/07/2016   Tdap 08/10/2011   Zoster Recombinat (Shingrix) 05/15/2018, 09/16/2018, 01/22/2019   Zoster, Live 09/06/2011     Objective: Vital Signs: BP 130/75 (BP Location: Right Arm, Patient Position: Sitting, Cuff Size: Normal)   Pulse 77   Resp 15   Ht _0  (1.575 m)   Wt 126 lb (57.2 kg)   BMI 23.05 kg/m    Physical Exam Vitals and nursing note reviewed.  Constitutional:      Appearance: She is well-developed.  HENT:     Head: Normocephalic and atraumatic.  Eyes:     Conjunctiva/sclera: Conjunctivae normal.  Cardiovascular:     Rate and Rhythm: Normal rate and regular rhythm.     Heart sounds: Normal heart sounds.  Pulmonary:     Effort: Pulmonary effort is normal.     Breath sounds: Normal breath sounds.   Abdominal:     General: Bowel sounds are normal.     Palpations: Abdomen is soft.  Musculoskeletal:     Cervical back: Normal range of motion.  Lymphadenopathy:     Cervical: No cervical adenopathy.  Skin:    General: Skin is warm and dry.     Capillary Refill: Capillary refill takes less than 2 seconds.  Neurological:     Mental Status: She is alert and oriented to person, place, and time.  Psychiatric:        Behavior: Behavior normal.     Musculoskeletal Exam: C-spine was in good range of motion.  Her bilateral shoulder joint abduction was limited to 90 degrees.  She had limited internal rotation.  Elbow joints and wrist joints with good range of motion.  She had tenderness over bilateral CMC and some of the PIP and DIP joints.  No synovitis was noted.  Hip joints and knee joints with good range of motion.  She had no tenderness over ankles or MTPs.  CDAI Exam: CDAI Score: -- Patient Global: --; Provider Global: -- Swollen: --; Tender: -- Joint Exam 09/09/2020   No joint exam has been documented for this visit   There is currently no information documented on the homunculus. Go to the Rheumatology activity and complete the homunculus joint exam.  Investigation: No additional findings.  Imaging: No results found.  Recent Labs: Lab Results  Component Value Date   WBC 4.7 03/17/2020   HGB 13.6 03/17/2020   PLT 187 03/17/2020   NA 140 09/05/2020   K 3.6 09/05/2020   CL 100 09/05/2020   CO2 32 09/05/2020   GLUCOSE 101 (H) 09/05/2020   BUN 27 (H) 09/05/2020   CREATININE 0.70 09/05/2020   BILITOT 0.5 01/23/2020   ALKPHOS 38 (L) 01/23/2020   AST 19 01/23/2020   ALT 18 01/23/2020  PROT 7.2 01/23/2020   ALBUMIN 4.6 01/23/2020   CALCIUM 10.1 09/05/2020   GFRAA >60 06/03/2016    January 17, 2019 20 RF <14, anti-CCP<16, ESR 5  Speciality Comments: No specialty comments available.  Procedures:  Large Joint Inj: R glenohumeral on 09/09/2020 10:28 AM Indications:  pain Details: 27 G 1.5 in needle, posterior approach  Arthrogram: No  Medications: 1 mL lidocaine 1 %; 40 mg triamcinolone acetonide 40 MG/ML Aspirate: 0 mL Outcome: tolerated well, no immediate complications Procedure, treatment alternatives, risks and benefits explained, specific risks discussed. Consent was given by the patient. Immediately prior to procedure a time out was called to verify the correct patient, procedure, equipment, support staff and site/side marked as required. Patient was prepped and draped in the usual sterile fashion.    Allergies: Demerol [meperidine], Kenalog [triamcinolone acetonide], and Adhesive [tape]   Assessment / Plan:     Visit Diagnoses: Chronic right shoulder pain - X-ray from January 28, 2020 was reviewed which showed severe glenohumeral and acromioclavicular narrowing.  No chondrocalcinosis was noted.  She has been having pain and discomfort in her right shoulder since she had a fall in June 2021.  Although she has had discomfort in her shoulders for many years.  Patient declined physical therapy.  I have given her a handout on shoulder exercises.  Per her request right shoulder joint was injected with lidocaine and Kenalog as described above.  We will see response to the injection.  She has severe osteoarthritis and may need total shoulder replacement in the future.  All autoimmune work-up has been negative in the past.  I will also check uric acid and iron studies today.  Chronic left shoulder pain -she has limited range of motion of her left shoulder joint with discomfort.  No warmth swelling or effusion was noted.  Plan: XR Shoulder Left.  The x-rays showed degenerative changes of the glenohumeral and acromioclavicular joint.  Inferior spur of the humeral head was noted.  The findings were discussed with the patient.  She was given a handout on shoulder joint exercises.  She declined physical therapy.  Primary osteoarthritis of both hands -she complains  of pain and discomfort in her bilateral hands with intermittent swelling.  No synovitis was noted.  She had tenderness over bilateral CMC joints.  Plan: XR Hand 2 View Right, XR Hand 2 View Left..  X-rays were consistent with osteoarthritis.  Detailed counseling osteoarthritis was provided.  A handout on hand exercises was provided.  Chronic pain of both knees -she complains of discomfort in her knee joints.  She has had cortisone and Visco supplement injections in the past.  Plan: XR KNEE 3 VIEW RIGHT, XR KNEE 3 VIEW LEFT.  X-rays of bilateral knee joint showed bilateral moderate osteoarthritis and moderate chondromalacia patella.  X-ray findings were discussed with the patient.  A handout on knee exercises was given.  DDD (degenerative disc disease), cervical - Fusion x1 by Dr. Ellene Route  DDD (degenerative disc disease), lumbar - Fusion x2 by Dr. Ellene Route.  She continues to have lower back pain.  She we will schedule appointment with Dr. Ellene Route.  Fibromyalgia-she gives history of generalized pain and discomfort.  She also is generalized myalgias and hyperalgesia.  Age-related osteoporosis without current pathological fracture - DEXA on 01/15/19: RFN BMD 0.563 with T-score -2.6.  She is on Prolia injections by her PCP.  Other medical problems are listed as follows:  History of hyperlipidemia  Anxiety and depression  Psychophysiological insomnia  Family  history of colon cancer  Family history of ovarian cancer  Family history of breast cancer in sister  Orders: Orders Placed This Encounter  Procedures   XR Shoulder Left   XR Hand 2 View Right   XR Hand 2 View Left   XR KNEE 3 VIEW RIGHT   XR KNEE 3 VIEW LEFT    No orders of the defined types were placed in this encounter.    Follow-Up Instructions: Return for Pain in multiple joints.   Bo Merino, MD  Note - This record has been created using Editor, commissioning.  Chart creation errors have been sought, but may not always   have been located. Such creation errors do not reflect on  the standard of medical care.

## 2020-09-05 ENCOUNTER — Other Ambulatory Visit: Payer: Self-pay

## 2020-09-05 ENCOUNTER — Other Ambulatory Visit (INDEPENDENT_AMBULATORY_CARE_PROVIDER_SITE_OTHER): Payer: Medicare Other

## 2020-09-05 DIAGNOSIS — M810 Age-related osteoporosis without current pathological fracture: Secondary | ICD-10-CM | POA: Diagnosis not present

## 2020-09-05 LAB — BASIC METABOLIC PANEL
BUN: 27 mg/dL — ABNORMAL HIGH (ref 6–23)
CO2: 32 mEq/L (ref 19–32)
Calcium: 10.1 mg/dL (ref 8.4–10.5)
Chloride: 100 mEq/L (ref 96–112)
Creatinine, Ser: 0.7 mg/dL (ref 0.40–1.20)
GFR: 86.55 mL/min (ref >60.00)
Glucose, Bld: 101 mg/dL — ABNORMAL HIGH (ref 70–99)
Potassium: 3.6 mEq/L (ref 3.5–5.1)
Sodium: 140 mEq/L (ref 135–145)

## 2020-09-09 ENCOUNTER — Ambulatory Visit: Payer: Self-pay

## 2020-09-09 ENCOUNTER — Other Ambulatory Visit: Payer: Self-pay

## 2020-09-09 ENCOUNTER — Ambulatory Visit (INDEPENDENT_AMBULATORY_CARE_PROVIDER_SITE_OTHER): Payer: Medicare Other | Admitting: Rheumatology

## 2020-09-09 ENCOUNTER — Encounter: Payer: Self-pay | Admitting: Rheumatology

## 2020-09-09 VITALS — BP 130/75 | HR 77 | Resp 15 | Ht 62.0 in | Wt 126.0 lb

## 2020-09-09 DIAGNOSIS — G8929 Other chronic pain: Secondary | ICD-10-CM | POA: Diagnosis not present

## 2020-09-09 DIAGNOSIS — F32A Depression, unspecified: Secondary | ICD-10-CM

## 2020-09-09 DIAGNOSIS — M25512 Pain in left shoulder: Secondary | ICD-10-CM | POA: Diagnosis not present

## 2020-09-09 DIAGNOSIS — M25561 Pain in right knee: Secondary | ICD-10-CM

## 2020-09-09 DIAGNOSIS — Z8639 Personal history of other endocrine, nutritional and metabolic disease: Secondary | ICD-10-CM | POA: Diagnosis not present

## 2020-09-09 DIAGNOSIS — M2550 Pain in unspecified joint: Secondary | ICD-10-CM

## 2020-09-09 DIAGNOSIS — M25511 Pain in right shoulder: Secondary | ICD-10-CM | POA: Diagnosis not present

## 2020-09-09 DIAGNOSIS — F5104 Psychophysiologic insomnia: Secondary | ICD-10-CM

## 2020-09-09 DIAGNOSIS — Z803 Family history of malignant neoplasm of breast: Secondary | ICD-10-CM

## 2020-09-09 DIAGNOSIS — F419 Anxiety disorder, unspecified: Secondary | ICD-10-CM | POA: Diagnosis not present

## 2020-09-09 DIAGNOSIS — M25562 Pain in left knee: Secondary | ICD-10-CM | POA: Diagnosis not present

## 2020-09-09 DIAGNOSIS — M5136 Other intervertebral disc degeneration, lumbar region: Secondary | ICD-10-CM | POA: Diagnosis not present

## 2020-09-09 DIAGNOSIS — M797 Fibromyalgia: Secondary | ICD-10-CM

## 2020-09-09 DIAGNOSIS — Z8041 Family history of malignant neoplasm of ovary: Secondary | ICD-10-CM

## 2020-09-09 DIAGNOSIS — Z800 Family history of malignant neoplasm of digestive organs: Secondary | ICD-10-CM | POA: Diagnosis not present

## 2020-09-09 DIAGNOSIS — M19042 Primary osteoarthritis, left hand: Secondary | ICD-10-CM | POA: Diagnosis not present

## 2020-09-09 DIAGNOSIS — M5030 Other cervical disc degeneration, unspecified cervical region: Secondary | ICD-10-CM | POA: Diagnosis not present

## 2020-09-09 DIAGNOSIS — M810 Age-related osteoporosis without current pathological fracture: Secondary | ICD-10-CM

## 2020-09-09 DIAGNOSIS — M19041 Primary osteoarthritis, right hand: Secondary | ICD-10-CM

## 2020-09-09 MED ORDER — LIDOCAINE HCL 1 % IJ SOLN
1.0000 mL | INTRAMUSCULAR | Status: AC | PRN
  Administered 2020-09-09: 1 mL

## 2020-09-09 MED ORDER — TRIAMCINOLONE ACETONIDE 40 MG/ML IJ SUSP
40.0000 mg | INTRAMUSCULAR | Status: AC | PRN
  Administered 2020-09-09: 40 mg via INTRA_ARTICULAR

## 2020-09-09 NOTE — Telephone Encounter (Signed)
Ca normal and CrCl at 65.50mL/min. OK for prolia inj.

## 2020-09-09 NOTE — Patient Instructions (Addendum)
Magnesium malate 250 mg at bedtime  Hand Exercises Hand exercises can be helpful for almost anyone. These exercises can strengthen the hands, improve flexibility and movement, and increase blood flow to the hands. These results can make work and daily tasks easier. Hand exercises can be especially helpful for people who have joint pain from arthritis or have nerve damage from overuse (carpal tunnel syndrome). These exercises can also help people who have injured a hand. Exercises Most of these hand exercises are gentle stretching and motion exercises. It is usually safe to do them often throughout the day. Warming up your hands before exercise may help to reduce stiffness. You can do this with gentle massage orby placing your hands in warm water for 10-15 minutes. It is normal to feel some stretching, pulling, tightness, or mild discomfort as you begin new exercises. This will gradually improve. Stop an exercise right away if you feel sudden, severe pain or your pain gets worse. Ask your healthcare provider which exercises are best for you. Knuckle bend or "claw" fist Stand or sit with your arm, hand, and all five fingers pointed straight up. Make sure to keep your wrist straight during the exercise. Gently bend your fingers down toward your palm until the tips of your fingers are touching the top of your palm. Keep your big knuckle straight and just bend the small knuckles in your fingers. Hold this position for __________ seconds. Straighten (extend) your fingers back to the starting position. Repeat this exercise 5-10 times with each hand. Full finger fist Stand or sit with your arm, hand, and all five fingers pointed straight up. Make sure to keep your wrist straight during the exercise. Gently bend your fingers into your palm until the tips of your fingers are touching the middle of your palm. Hold this position for __________ seconds. Extend your fingers back to the starting position,  stretching every joint fully. Repeat this exercise 5-10 times with each hand. Straight fist Stand or sit with your arm, hand, and all five fingers pointed straight up. Make sure to keep your wrist straight during the exercise. Gently bend your fingers at the big knuckle, where your fingers meet your hand, and the middle knuckle. Keep the knuckle at the tips of your fingers straight and try to touch the bottom of your palm. Hold this position for __________ seconds. Extend your fingers back to the starting position, stretching every joint fully. Repeat this exercise 5-10 times with each hand. Tabletop Stand or sit with your arm, hand, and all five fingers pointed straight up. Make sure to keep your wrist straight during the exercise. Gently bend your fingers at the big knuckle, where your fingers meet your hand, as far down as you can while keeping the small knuckles in your fingers straight. Think of forming a tabletop with your fingers. Hold this position for __________ seconds. Extend your fingers back to the starting position, stretching every joint fully. Repeat this exercise 5-10 times with each hand. Finger spread Place your hand flat on a table with your palm facing down. Make sure your wrist stays straight as you do this exercise. Spread your fingers and thumb apart from each other as far as you can until you feel a gentle stretch. Hold this position for __________ seconds. Bring your fingers and thumb tight together again. Hold this position for __________ seconds. Repeat this exercise 5-10 times with each hand. Making circles Stand or sit with your arm, hand, and all five fingers pointed  straight up. Make sure to keep your wrist straight during the exercise. Make a circle by touching the tip of your thumb to the tip of your index finger. Hold for __________ seconds. Then open your hand wide. Repeat this motion with your thumb and each finger on your hand. Repeat this exercise 5-10  times with each hand. Thumb motion Sit with your forearm resting on a table and your wrist straight. Your thumb should be facing up toward the ceiling. Keep your fingers relaxed as you move your thumb. Lift your thumb up as high as you can toward the ceiling. Hold for __________ seconds. Bend your thumb across your palm as far as you can, reaching the tip of your thumb for the small finger (pinkie) side of your palm. Hold for __________ seconds. Repeat this exercise 5-10 times with each hand. Grip strengthening  Hold a stress ball or other soft ball in the middle of your hand. Slowly increase the pressure, squeezing the ball as much as you can without causing pain. Think of bringing the tips of your fingers into the middle of your palm. All of your finger joints should bend when doing this exercise. Hold your squeeze for __________ seconds, then relax. Repeat this exercise 5-10 times with each hand. Contact a health care provider if: Your hand pain or discomfort gets much worse when you do an exercise. Your hand pain or discomfort does not improve within 2 hours after you exercise. If you have any of these problems, stop doing these exercises right away. Do not do them again unless your health care provider says that you can. Get help right away if: You develop sudden, severe hand pain or swelling. If this happens, stop doing these exercises right away. Do not do them again unless your health care provider says that you can. This information is not intended to replace advice given to you by your health care provider. Make sure you discuss any questions you have with your healthcare provider. Document Revised: 06/15/2018 Document Reviewed: 02/23/2018 Elsevier Patient Education  [place]. Shoulder Exercises Ask your health care provider which exercises are safe for you. Do exercises exactly as told by your health care provider and adjust them as directed. It is normal to feel mild  stretching, pulling, tightness, or discomfort as you do these exercises. Stop right away if you feel sudden pain or your pain gets worse. Do not begin these exercises until told by your health care provider. Stretching exercises External rotation and abduction This exercise is sometimes called corner stretch. This exercise rotates your arm outward (external rotation) and moves your arm out from your body (abduction). Stand in a doorway with one of your feet slightly in front of the other. This is called a staggered stance. If you cannot reach your forearms to the door frame, stand facing a corner of a room. Choose one of the following positions as told by your health care provider: Place your hands and forearms on the door frame above your head. Place your hands and forearms on the door frame at the height of your head. Place your hands on the door frame at the height of your elbows. Slowly move your weight onto your front foot until you feel a stretch across your chest and in the front of your shoulders. Keep your head and chest upright and keep your abdominal muscles tight. Hold for __________ seconds. To release the stretch, shift your weight to your back foot. Repeat __________ times. Complete  this exercise __________ times a day. Extension, standing Stand and hold a broomstick, a cane, or a similar object behind your back. Your hands should be a little wider than shoulder width apart. Your palms should face away from your back. Keeping your elbows straight and your shoulder muscles relaxed, move the stick away from your body until you feel a stretch in your shoulders (extension). Avoid shrugging your shoulders while you move the stick. Keep your shoulder blades tucked down toward the middle of your back. Hold for __________ seconds. Slowly return to the starting position. Repeat __________ times. Complete this exercise __________ times a day. Range-of-motion exercises Pendulum  Stand  near a wall or a surface that you can hold onto for balance. Bend at the waist and let your left / right arm hang straight down. Use your other arm to support you. Keep your back straight and do not lock your knees. Relax your left / right arm and shoulder muscles, and move your hips and your trunk so your left / right arm swings freely. Your arm should swing because of the motion of your body, not because you are using your arm or shoulder muscles. Keep moving your hips and trunk so your arm swings in the following directions, as told by your health care provider: Side to side. Forward and backward. In clockwise and counterclockwise circles. Continue each motion for __________ seconds, or for as long as told by your health care provider. Slowly return to the starting position. Repeat __________ times. Complete this exercise __________ times a day. Shoulder flexion, standing  Stand and hold a broomstick, a cane, or a similar object. Place your hands a little more than shoulder width apart on the object. Your left / right hand should be palm up, and your other hand should be palm down. Keep your elbow straight and your shoulder muscles relaxed. Push the stick up with your healthy arm to raise your left / right arm in front of your body, and then over your head until you feel a stretch in your shoulder (flexion). Avoid shrugging your shoulder while you raise your arm. Keep your shoulder blade tucked down toward the middle of your back. Hold for __________ seconds. Slowly return to the starting position. Repeat __________ times. Complete this exercise __________ times a day. Shoulder abduction, standing Stand and hold a broomstick, a cane, or a similar object. Place your hands a little more than shoulder width apart on the object. Your left / right hand should be palm up, and your other hand should be palm down. Keep your elbow straight and your shoulder muscles relaxed. Push the object across your  body toward your left / right side. Raise your left / right arm to the side of your body (abduction) until you feel a stretch in your shoulder. Do not raise your arm above shoulder height unless your health care provider tells you to do that. If directed, raise your arm over your head. Avoid shrugging your shoulder while you raise your arm. Keep your shoulder blade tucked down toward the middle of your back. Hold for __________ seconds. Slowly return to the starting position. Repeat __________ times. Complete this exercise __________ times a day. Internal rotation  Place your left / right hand behind your back, palm up. Use your other hand to dangle an exercise band, a towel, or a similar object over your shoulder. Grasp the band with your left / right hand so you are holding on to both ends. Gently  pull up on the band until you feel a stretch in the front of your left / right shoulder. The movement of your arm toward the center of your body is called internal rotation. Avoid shrugging your shoulder while you raise your arm. Keep your shoulder blade tucked down toward the middle of your back. Hold for __________ seconds. Release the stretch by letting go of the band and lowering your hands. Repeat __________ times. Complete this exercise __________ times a day. Strengthening exercises External rotation  Sit in a stable chair without armrests. Secure an exercise band to a stable object at elbow height on your left / right side. Place a soft object, such as a folded towel or a small pillow, between your left / right upper arm and your body to move your elbow about 4 inches (10 cm) away from your side. Hold the end of the exercise band so it is tight and there is no slack. Keeping your elbow pressed against the soft object, slowly move your forearm out, away from your abdomen (external rotation). Keep your body steady so only your forearm moves. Hold for __________ seconds. Slowly return to the  starting position. Repeat __________ times. Complete this exercise __________ times a day. Shoulder abduction  Sit in a stable chair without armrests, or stand up. Hold a __________ weight in your left / right hand, or hold an exercise band with both hands. Start with your arms straight down and your left / right palm facing in, toward your body. Slowly lift your left / right hand out to your side (abduction). Do not lift your hand above shoulder height unless your health care provider tells you that this is safe. Keep your arms straight. Avoid shrugging your shoulder while you do this movement. Keep your shoulder blade tucked down toward the middle of your back. Hold for __________ seconds. Slowly lower your arm, and return to the starting position. Repeat __________ times. Complete this exercise __________ times a day. Shoulder extension Sit in a stable chair without armrests, or stand up. Secure an exercise band to a stable object in front of you so it is at shoulder height. Hold one end of the exercise band in each hand. Your palms should face each other. Straighten your elbows and lift your hands up to shoulder height. Step back, away from the secured end of the exercise band, until the band is tight and there is no slack. Squeeze your shoulder blades together as you pull your hands down to the sides of your thighs (extension). Stop when your hands are straight down by your sides. Do not let your hands go behind your body. Hold for __________ seconds. Slowly return to the starting position. Repeat __________ times. Complete this exercise __________ times a day. Shoulder row Sit in a stable chair without armrests, or stand up. Secure an exercise band to a stable object in front of you so it is at waist height. Hold one end of the exercise band in each hand. Position your palms so that your thumbs are facing the ceiling (neutral position). Bend each of your elbows to a 90-degree angle  (right angle) and keep your upper arms at your sides. Step back until the band is tight and there is no slack. Slowly pull your elbows back behind you. Hold for __________ seconds. Slowly return to the starting position. Repeat __________ times. Complete this exercise __________ times a day. Shoulder press-ups  Sit in a stable chair that has armrests. Sit upright,  with your feet flat on the floor. Put your hands on the armrests so your elbows are bent and your fingers are pointing forward. Your hands should be about even with the sides of your body. Push down on the armrests and use your arms to lift yourself off the chair. Straighten your elbows and lift yourself up as much as you comfortably can. Move your shoulder blades down, and avoid letting your shoulders move up toward your ears. Keep your feet on the ground. As you get stronger, your feet should support less of your body weight as you lift yourself up. Hold for __________ seconds. Slowly lower yourself back into the chair. Repeat __________ times. Complete this exercise __________ times a day. Wall push-ups  Stand so you are facing a stable wall. Your feet should be about one arm-length away from the wall. Lean forward and place your palms on the wall at shoulder height. Keep your feet flat on the floor as you bend your elbows and lean forward toward the wall. Hold for __________ seconds. Straighten your elbows to push yourself back to the starting position. Repeat __________ times. Complete this exercise __________ times a day. This information is not intended to replace advice given to you by your health care provider. Make sure you discuss any questions you have with your healthcare provider. Document Revised: 06/16/2018 Document Reviewed: 03/24/2018 Elsevier Patient Education  2022 [employer]Journal for Nurse Practitioners, 15(4), [phone]. Retrieved December 12, 2017 from http://clinicalkey.com/nursing">  Knee Exercises Ask  your health care provider which exercises are safe for you. Do exercises exactly as told by your health care provider and adjust them as directed. It is normal to feel mild stretching, pulling, tightness, or discomfort as you do these exercises. Stop right away if you feel sudden pain or your pain gets worse. Do not begin these exercises until told by your health care provider. Stretching and range-of-motion exercises These exercises warm up your muscles and joints and improve the movement and flexibility of your knee. These exercises also help to relieve pain andswelling. Knee extension, prone Lie on your abdomen (prone position) on a bed. Place your left / right knee just beyond the edge of the surface so your knee is not on the bed. You can put a towel under your left / right thigh just above your kneecap for comfort. Relax your leg muscles and allow gravity to straighten your knee (extension). You should feel a stretch behind your left / right knee. Hold this position for __________ seconds. Scoot up so your knee is supported between repetitions. Repeat __________ times. Complete this exercise __________ times a day. Knee flexion, active  Lie on your back with both legs straight. If this causes back discomfort, bend your left / right knee so your foot is flat on the floor. Slowly slide your left / right heel back toward your buttocks. Stop when you feel a gentle stretch in the front of your knee or thigh (flexion). Hold this position for __________ seconds. Slowly slide your left / right heel back to the starting position. Repeat __________ times. Complete this exercise __________ times a day. Quadriceps stretch, prone  Lie on your abdomen on a firm surface, such as a bed or padded floor. Bend your left / right knee and hold your ankle. If you cannot reach your ankle or pant leg, loop a belt around your foot and grab the belt instead. Gently pull your heel toward your buttocks. Your knee  should not slide out  to the side. You should feel a stretch in the front of your thigh and knee (quadriceps). Hold this position for __________ seconds. Repeat __________ times. Complete this exercise __________ times a day. Hamstring, supine Lie on your back (supine position). Loop a belt or towel over the ball of your left / right foot. The ball of your foot is on the walking surface, right under your toes. Straighten your left / right knee and slowly pull on the belt to raise your leg until you feel a gentle stretch behind your knee (hamstring). Do not let your knee bend while you do this. Keep your other leg flat on the floor. Hold this position for __________ seconds. Repeat __________ times. Complete this exercise __________ times a day. Strengthening exercises These exercises build strength and endurance in your knee. Endurance is theability to use your muscles for a long time, even after they get tired. Quadriceps, isometric This exercise stretches the muscles in front of your thigh (quadriceps) without moving your knee joint (isometric). Lie on your back with your left / right leg extended and your other knee bent. Put a rolled towel or small pillow under your knee if told by your health care provider. Slowly tense the muscles in the front of your left / right thigh. You should see your kneecap slide up toward your hip or see increased dimpling just above the knee. This motion will push the back of the knee toward the floor. For __________ seconds, hold the muscle as tight as you can without increasing your pain. Relax the muscles slowly and completely. Repeat __________ times. Complete this exercise __________ times a day. Straight leg raises This exercise stretches the muscles in front of your thigh (quadriceps) and the muscles that move your hips (hip flexors). Lie on your back with your left / right leg extended and your other knee bent. Tense the muscles in the front of your left  / right thigh. You should see your kneecap slide up or see increased dimpling just above the knee. Your thigh may even shake a bit. Keep these muscles tight as you raise your leg 4-6 inches (10-15 cm) off the floor. Do not let your knee bend. Hold this position for __________ seconds. Keep these muscles tense as you lower your leg. Relax your muscles slowly and completely after each repetition. Repeat __________ times. Complete this exercise __________ times a day. Hamstring, isometric Lie on your back on a firm surface. Bend your left / right knee about __________ degrees. Dig your left / right heel into the surface as if you are trying to pull it toward your buttocks. Tighten the muscles in the back of your thighs (hamstring) to "dig" as hard as you can without increasing any pain. Hold this position for __________ seconds. Release the tension gradually and allow your muscles to relax completely for __________ seconds after each repetition. Repeat __________ times. Complete this exercise __________ times a day. Hamstring curls If told by your health care provider, do this exercise while wearing ankle weights. Begin with __________ lb weights. Then increase the weight by 1 lb (0.5 kg) increments. Do not wear ankle weights that are more than __________ lb. Lie on your abdomen with your legs straight. Bend your left / right knee as far as you can without feeling pain. Keep your hips flat against the floor. Hold this position for __________ seconds. Slowly lower your leg to the starting position. Repeat __________ times. Complete this exercise __________ times a day.  Squats This exercise strengthens the muscles in front of your thigh and knee (quadriceps). Stand in front of a table, with your feet and knees pointing straight ahead. You may rest your hands on the table for balance but not for support. Slowly bend your knees and lower your hips like you are going to sit in a chair. Keep your  weight over your heels, not over your toes. Keep your lower legs upright so they are parallel with the table legs. Do not let your hips go lower than your knees. Do not bend lower than told by your health care provider. If your knee pain increases, do not bend as low. Hold the squat position for __________ seconds. Slowly push with your legs to return to standing. Do not use your hands to pull yourself to standing. Repeat __________ times. Complete this exercise __________ times a day. Wall slides This exercise strengthens the muscles in front of your thigh and knee (quadriceps). Lean your back against a smooth wall or door, and walk your feet out 18-24 inches (46-61 cm) from it. Place your feet hip-width apart. Slowly slide down the wall or door until your knees bend __________ degrees. Keep your knees over your heels, not over your toes. Keep your knees in line with your hips. Hold this position for __________ seconds. Repeat __________ times. Complete this exercise __________ times a day. Straight leg raises This exercise strengthens the muscles that rotate the leg at the hip and move it away from your body (hip abductors). Lie on your side with your left / right leg in the top position. Lie so your head, shoulder, knee, and hip line up. You may bend your bottom knee to help you keep your balance. Roll your hips slightly forward so your hips are stacked directly over each other and your left / right knee is facing forward. Leading with your heel, lift your top leg 4-6 inches (10-15 cm). You should feel the muscles in your outer hip lifting. Do not let your foot drift forward. Do not let your knee roll toward the ceiling. Hold this position for __________ seconds. Slowly return your leg to the starting position. Let your muscles relax completely after each repetition. Repeat __________ times. Complete this exercise __________ times a day. Straight leg raises This exercise stretches the  muscles that move your hips away from the front of the pelvis (hip extensors). Lie on your abdomen on a firm surface. You can put a pillow under your hips if that is more comfortable. Tense the muscles in your buttocks and lift your left / right leg about 4-6 inches (10-15 cm). Keep your knee straight as you lift your leg. Hold this position for __________ seconds. Slowly lower your leg to the starting position. Let your leg relax completely after each repetition. Repeat __________ times. Complete this exercise __________ times a day. This information is not intended to replace advice given to you by your health care provider. Make sure you discuss any questions you have with your healthcare provider. Document Revised: 12/13/2017 Document Reviewed: 12/13/2017 Elsevier Patient Education  2022 [employer].

## 2020-09-10 ENCOUNTER — Ambulatory Visit (INDEPENDENT_AMBULATORY_CARE_PROVIDER_SITE_OTHER): Payer: Medicare Other

## 2020-09-10 DIAGNOSIS — M810 Age-related osteoporosis without current pathological fracture: Secondary | ICD-10-CM | POA: Diagnosis not present

## 2020-09-10 LAB — MAGNESIUM: Magnesium: 2.2 mg/dL (ref 1.5–2.5)

## 2020-09-10 LAB — IRON,TIBC AND FERRITIN PANEL
%SAT: 35 % (calc) (ref 16–45)
Ferritin: 347 ng/mL — ABNORMAL HIGH (ref 16–288)
Iron: 108 ug/dL (ref 45–160)
TIBC: 310 mcg/dL (calc) (ref 250–450)

## 2020-09-10 LAB — URIC ACID: Uric Acid, Serum: 3.7 mg/dL (ref 2.5–7.0)

## 2020-09-10 MED ORDER — DENOSUMAB 60 MG/ML ~~LOC~~ SOSY
60.0000 mg | PREFILLED_SYRINGE | Freq: Once | SUBCUTANEOUS | Status: AC
  Administered 2020-09-10: 60 mg via SUBCUTANEOUS

## 2020-09-10 NOTE — Progress Notes (Signed)
Ferritin mildly elevated, not of concern.  Uric acid is normal, and magnesium is normal.

## 2020-09-10 NOTE — Progress Notes (Signed)
Per orders of Allie Bossier, NP injection of Prolia given by Loreen Freud. Patient tolerated injection well.

## 2020-09-17 DIAGNOSIS — M5136 Other intervertebral disc degeneration, lumbar region: Secondary | ICD-10-CM

## 2020-09-17 DIAGNOSIS — M545 Low back pain, unspecified: Secondary | ICD-10-CM

## 2020-09-17 DIAGNOSIS — Z981 Arthrodesis status: Secondary | ICD-10-CM

## 2020-09-17 DIAGNOSIS — G8929 Other chronic pain: Secondary | ICD-10-CM

## 2020-09-25 ENCOUNTER — Telehealth: Payer: Self-pay | Admitting: Oncology

## 2020-09-25 ENCOUNTER — Telehealth: Payer: Self-pay | Admitting: Primary Care

## 2020-09-25 NOTE — Telephone Encounter (Signed)
Mrs. Shetterly called in wanted to know if [name] can do a referral for Dr. Kristeen Miss  - [address] [place] [id number], [place],  [address] , the phone number is [phone] and the fax number is [phone]

## 2020-09-25 NOTE — Telephone Encounter (Signed)
Called patient to reschedule her appt on 7/28. She stated that she is following up with her PCP and feels that she does not need to return to the cancer center.

## 2020-09-25 NOTE — Telephone Encounter (Signed)
When reviewing chart looks like a my chart was sent but I do not see in my box.

## 2020-09-25 NOTE — Telephone Encounter (Signed)
This was already done.  See my chart message, routed to [facility]

## 2020-09-25 NOTE — Telephone Encounter (Signed)
Left VM with patient to make her aware of changes made to her appt on 7/28. Left new date/time and sending reminder in the mail also.

## 2020-09-25 NOTE — Telephone Encounter (Signed)
Do you mind taking a look at this? She said you helped her with her MRI, thanks so much!

## 2020-09-28 NOTE — Progress Notes (Signed)
Office Visit Note  Patient: Tracy Estrada             Date of Birth: [birth year]           MRN: [id number]             PCP: [name], NP Referring: [name], NP Visit Date: 10/08/2020 Occupation: '@GUAROCC'$ @  Subjective:  Pain in multiple joints.   History of Present Illness: Tracy Estrada is a 70 y.o. female with history of pain in multiple joints.  She returns for follow-up today.  She states she continues to have pain and discomfort in her bilateral shoulders, hands, knees and her neck and lower back.  She states she recently had MRI of her lumbar spine.  She has appointment pending with Dr. Ellene Route.  She continues to have pain and discomfort of her bilateral shoulder joints especially at nighttime.  She reports difficulty dressing and grooming.  Activities of Daily Living:  Patient reports morning stiffness for several  hours.   Patient Denies nocturnal pain.  Difficulty dressing/grooming: Reports Difficulty climbing stairs: Denies Difficulty getting out of chair: Denies Difficulty using hands for taps, buttons, cutlery, and/or writing: Denies  Review of Systems  Constitutional:  Positive for fatigue.  HENT:  Positive for mouth dryness and nose dryness. Negative for mouth sores.   Eyes:  Positive for dryness. Negative for pain and itching.  Respiratory:  Negative for shortness of breath and difficulty breathing.   Cardiovascular:  Negative for chest pain and palpitations.  Gastrointestinal:  Negative for blood in stool, constipation and diarrhea.  Endocrine: Positive for cold intolerance. Negative for increased urination.  Genitourinary:  Negative for difficulty urinating.  Musculoskeletal:  Positive for joint pain, joint pain, myalgias, morning stiffness, muscle tenderness and myalgias. Negative for joint swelling.  Skin:  Positive for color change. Negative for rash and redness.  Allergic/Immunologic: Negative for susceptible to infections.  Neurological:   Positive for headaches and memory loss. Negative for dizziness and numbness.  Hematological:  Positive for bruising/bleeding tendency.  Psychiatric/Behavioral:  Negative for confusion.    PMFS History:  Patient Active Problem List   Diagnosis Date Noted   Leukopenia 01/28/2020   Chronic right shoulder pain 01/28/2020   Foul smelling urine 09/04/2019   Medicare annual wellness visit, subsequent 01/25/2019   Hyperlipidemia 01/25/2019   Osteoporosis 01/25/2019   Rash and nonspecific skin eruption 04/13/2018   Fibromyalgia 09/16/2015   Anxiety and depression 09/16/2015   Insomnia 09/16/2015   Genetic testing 03/21/2015   Family history of breast cancer in sister 02/27/2015   Family history of ovarian cancer 02/27/2015   Family history of colon cancer 02/27/2015    Past Medical History:  Diagnosis Date   Achilles bursitis or tendinitis    Anxiety state, unspecified    Arthritis    Closed fracture of unspecified part of fibula    Depressive disorder, not elsewhere classified    Diverticulosis of colon (without mention of hemorrhage)    Edema    Fibromyalgia    Insomnia, unspecified    Lumbago    Mass of left ovary 06/01/2016   Meniere's disease 1986   MVP (mitral valve prolapse)    Other intra-abdominal and pelvic swelling, mass and lump 06/01/2016   Pain in joint, site unspecified    Peptic ulcer    no testing   Pure hypercholesterolemia    TSH elevation     Family History  Problem Relation Age of Onset   Hyperlipidemia Mother  Hypertension Mother    Colon polyps Mother    Coronary artery disease Father         AMI age 12   Heart disease Father 61       AMI x 2; first AMI in 52s   Breast cancer Sister 57       with metastasis to bone; smoker   Cancer Sister 29       breast cancer   Colon cancer Maternal Uncle        dx. >50   Colon cancer Paternal Aunt        dx. 31s   Heart attack Paternal Uncle    Stroke Maternal Grandmother    Alzheimer's disease Paternal  Grandmother    Heart disease Paternal Grandfather    Heart attack Paternal Grandfather    Healthy Daughter    Healthy Daughter    Breast cancer Cousin    Ovarian cancer Cousin        dx. late 70s/dx. 36s   Lung cancer Cousin        dx. 70s   Stomach cancer Other    Past Surgical History:  Procedure Laterality Date   ABDOMINAL HYSTERECTOMY  03/08/1980   fibroids; ovaries intact.   BACK SURGERY     x2 Cervical and lumbar   CYSTOSCOPY N/A 06/08/2016   Procedure: CYSTOSCOPY;  Surgeon: Will Bonnet, MD;  Location: [facility];  Service: Gynecology;  Laterality: N/A;   LAPAROSCOPIC LYSIS OF ADHESIONS  06/08/2016   Procedure: LAPAROSCOPIC LYSIS OF ADHESIONS;  Surgeon: Will Bonnet, MD;  Location: [facility];  Service: Gynecology;;   LAPAROSCOPIC SALPINGO OOPHERECTOMY Left 06/08/2016   Procedure: LAPAROSCOPIC SALPINGO OOPHORECTOMY;  Surgeon: Will Bonnet, MD;  Location: [facility];  Service: Gynecology;  Laterality: Left;   OOPHORECTOMY     left ovary   SALPINGECTOMY     left   SPINE SURGERY     lumbar spine 11/2015; cervical spine 2009. [facility].   Social History   Social History Narrative   Marital status:  Married x 14 years; second marriage;happily married,husband has PTSD.     Children: 2 daughters [name], [name]); 7 grandchildren; 2 gg      Lives: with husband, dog/beagle      Employment: retired since 2013; Network engineer      Tobacco: never      Alcohol: weekends; 1-2 glasses of wine per week or beer.      Drugs:  None      Exercise: none in 2018      Seatbelt: 100%      Guns: unloaded.     Caffeine not every day. 7 grandchildren and 2 GG. Organ donor NO.      ADLs: independent with ADLs; drives      Advanced Directives: YES; FULL CODE no prolonged measures.   Immunization History  Administered Date(s) Administered   Fluad Quad(high Dose 65+) 01/22/2019, 01/09/2020   Influenza,inj,Quad PF,6+ Mos 11/06/2014   Influenza-Unspecified 01/07/2016, 12/13/2016   PFIZER(Purple  Top)SARS-COV-2 Vaccination 04/19/2019, 05/08/2019   Pneumococcal Conjugate-13 11/06/2014   Pneumococcal Polysaccharide-23 04/07/2016   Tdap 08/10/2011   Zoster Recombinat (Shingrix) 05/15/2018, 09/16/2018, 01/22/2019   Zoster, Live 09/06/2011     Objective: Vital Signs: BP 129/79 (BP Location: Left Arm, Patient Position: Sitting, Cuff Size: Normal)   Pulse 74   Ht '5\' 1"'$  (1.549 m)   Wt 125 lb (56.7 kg)   BMI 23.62 kg/m    Physical Exam Vitals and nursing note reviewed.  Constitutional:      Appearance: She is well-developed.  HENT:     Head: Normocephalic and atraumatic.  Eyes:     Conjunctiva/sclera: Conjunctivae normal.  Cardiovascular:     Rate and Rhythm: Normal rate and regular rhythm.     Heart sounds: Normal heart sounds.  Pulmonary:     Effort: Pulmonary effort is normal.     Breath sounds: Normal breath sounds.  Abdominal:     General: Bowel sounds are normal.     Palpations: Abdomen is soft.  Musculoskeletal:     Cervical back: Normal range of motion.  Lymphadenopathy:     Cervical: No cervical adenopathy.  Skin:    General: Skin is warm and dry.     Capillary Refill: Capillary refill takes less than 2 seconds.  Neurological:     Mental Status: She is alert and oriented to person, place, and time.  Psychiatric:        Behavior: Behavior normal.     Musculoskeletal Exam: She had limited range of motion of her cervical and lumbar spine.  She had discomfort range of motion of bilateral shoulder joints and had limited abduction and internal rotation.  Elbow joints and wrist joints with good range of motion.  She had bilateral PIP and DIP thickening.  She had good range of motion of her hip joints and knee joints.  She had tenderness over PIPs and DIPs.  CDAI Exam: CDAI Score: -- Patient Global: --; Provider Global: -- Swollen: --; Tender: -- Joint Exam 10/08/2020   No joint exam has been documented for this visit   There is currently no information  documented on the homunculus. Go to the Rheumatology activity and complete the homunculus joint exam.  Investigation: No additional findings.  Imaging: MR Lumbar Spine W Wo Contrast  Result Date: 10/01/2020 CLINICAL DATA:  Fall 1 year ago. Worsening back pain. Surgery in 2018. Low back pain, > 6 wks EXAM: MRI LUMBAR SPINE WITHOUT AND WITH CONTRAST TECHNIQUE: Multiplanar and multiecho pulse sequences of the lumbar spine were obtained without and with intravenous contrast. CONTRAST:  43m GADAVIST GADOBUTROL 1 MMOL/ML IV SOLN COMPARISON:  None. FINDINGS: Segmentation:  Standard Alignment:  Physiologic Vertebrae:  No fracture, evidence of discitis, or bone lesion. Conus medullaris and cauda equina: Conus extends to the L1 level. Conus and cauda equina appear normal. Paraspinal and other soft tissues: Negative Disc levels: L1-L2: Unchanged small central disc protrusion. No spinal canal stenosis. No neural foraminal stenosis. L2-L3: Unchanged disc height loss with small bulge. No spinal canal stenosis. No neural foraminal stenosis. L3-L4: Decreased size of central disc protrusion superimposed on mild disc bulge. Improvement of mild spinal canal stenosis. Mild facet hypertrophy without neural foraminal stenosis. L4-L5: Progression of disc height loss. Previously seen right subarticular protrusion is no longer present. Postsurgical changes. New, slight left lateral recess narrowing without central spinal canal stenosis. Worsened mild bilateral neural foraminal stenosis. L5-S1: Small disc bulge, unchanged. Normal facets. No spinal canal stenosis. No neural foraminal stenosis. Visualized sacrum: Normal. IMPRESSION: 1. Progression of disc height loss at L4-L5 with new, slight left lateral recess narrowing and mild bilateral neural foraminal stenosis. 2. Decreased size of central disc protrusion at L3-L4 with improved mild spinal canal stenosis. Electronically Signed   By: KUlyses JarredM.D.   On: 10/01/2020 11:50    XR Hand 2 View Left  Result Date: 09/09/2020 [facility]MC, PIP and DIP narrowing was noted.  No MCP, intercarpal or radiocarpal joint space narrowing was noted.  No erosive changes were noted. Impression: These findings are consistent with osteoarthritis of the hand.  XR Hand 2 View Right  Result Date: 09/09/2020 CMC, PIP and DIP narrowing was noted.  No MCP, intercarpal or radiocarpal joint space narrowing was noted.  No erosive changes were noted. Impression: These findings are consistent with osteoarthritis of the hand.  XR KNEE 3 VIEW LEFT  Result Date: 09/09/2020 Moderate medial compartment narrowing and moderate patellofemoral narrowing was noted.  No chondrocalcinosis was noted. Impression: These findings are consistent with moderate osteoarthritis and moderate chondromalacia patella.  XR KNEE 3 VIEW RIGHT  Result Date: 09/09/2020 Moderate medial compartment narrowing was noted.  Moderate patellofemoral narrowing was noted.  No chondrocalcinosis was noted. Impression: These findings are consistent with moderate osteoarthritis and moderate chondromalacia patella.  XR Shoulder Left  Result Date: 09/09/2020 Glenohumeral joint space and acromioclavicular joint space narrowing was noted.  Inferior spur of the humeral head was noted. Impression: These findings are consistent with degenerative changes of the glenohumeral and acromioclavicular joint.   Recent Labs: Lab Results  Component Value Date   WBC 4.7 03/17/2020   HGB 13.6 03/17/2020   PLT 187 03/17/2020   NA 140 09/05/2020   K 3.6 09/05/2020   CL 100 09/05/2020   CO2 32 09/05/2020   GLUCOSE 101 (H) 09/05/2020   BUN 27 (H) 09/05/2020   CREATININE 0.70 09/05/2020   BILITOT 0.5 01/23/2020   ALKPHOS 38 (L) 01/23/2020   AST 19 01/23/2020   ALT 18 01/23/2020   PROT 7.2 01/23/2020   ALBUMIN 4.6 01/23/2020   CALCIUM 10.1 09/05/2020   GFRAA >60 06/03/2016   September 09, 2000 iron studies normal except ferritin elevated at 347, uric acid 3.7,  magnesium 2.2   Speciality Comments: No specialty comments available.  Procedures:  No procedures performed Allergies: Demerol [meperidine], Kenalog [triamcinolone acetonide], and Adhesive [tape]   Assessment / Plan:     Visit Diagnoses: Primary osteoarthritis of both shoulders - She has severe osteoarthritis of the right shoulder and moderate osteoarthritis of the left shoulder joint.  Right shoulder joint was injected on September 09, 2020.  Patient states the cortisone injection lasted only for 3 days and the symptoms recurred.  I detailed discussion regarding total shoulder replacement.  She would like to hold off on the surgery until next year.  I gave her a handout on exercises.  I also offered physical therapy.  She stated that she will let us know when she is ready for physical therapy and a referral to an orthopedic surgeon.  Primary osteoarthritis of both hands - Clinical and radiographic findings are consistent with osteoarthritis.  Patient gives history of intermittent swelling.  No synovitis was noted.  Joint protection muscle strengthening was discussed.  Primary osteoarthritis of both knees - bilateral moderate osteoarthritis and moderate chondromalacia patella noted on the x-rays at the last visit.  A handout on exercises was given.  X-ray and lab findings were discussed.  A handout on exercises was given.  DDD (degenerative disc disease), cervical - status post fusion x1 by Dr. Ellene Route.  She has chronic pain.  DDD (degenerative disc disease), lumbar - Status post fusion x2 by Dr. Ellene Route.  Patient was to see Dr. Ellene Route for a follow-up visit.  She had recent MRI.  She will follow-up with Dr. Ellene Route.  Fibromyalgia-she continues to have generalized pain and discomfort.  Age-related osteoporosis without current pathological fracture - DEXA on 01/15/19: RFN BMD 0.563 with T-score -2.6.  She is on  Prolia injections by her PCP.  History of hyperlipidemia  Anxiety and  depression  Psychophysiological insomnia  Family history of ovarian cancer  Family history of breast cancer in sister  Family history of colon cancer  Orders: No orders of the defined types were placed in this encounter.  No orders of the defined types were placed in this encounter.    Follow-Up Instructions: Return in about 6 months (around 04/10/2021) for Osteoarthritis.   Bo Merino, MD  Note - This record has been created using Editor, commissioning.  Chart creation errors have been sought, but may not always  have been located. Such creation errors do not reflect on  the standard of medical care.

## 2020-09-29 NOTE — Telephone Encounter (Signed)
Confirmed with Dr Ellene Route office that they received the referral on 09/25/20 and patient is already scheduled with him on 12/05/20. I called and left detailed message on patient's phone number advising her of this information and to make sure she was aware of the appointment. For any further questions about this appointment patient can call Dr Clarice Pole office.

## 2020-09-30 ENCOUNTER — Other Ambulatory Visit: Payer: Self-pay

## 2020-09-30 ENCOUNTER — Ambulatory Visit
Admission: RE | Admit: 2020-09-30 | Discharge: 2020-09-30 | Disposition: A | Discharge status: Home / Self Care | Payer: Medicare Other | Source: Ambulatory Visit | Attending: Primary Care | Admitting: Primary Care

## 2020-09-30 DIAGNOSIS — M545 Low back pain, unspecified: Secondary | ICD-10-CM | POA: Insufficient documentation

## 2020-09-30 DIAGNOSIS — M5136 Other intervertebral disc degeneration, lumbar region: Secondary | ICD-10-CM | POA: Insufficient documentation

## 2020-09-30 DIAGNOSIS — Z981 Arthrodesis status: Secondary | ICD-10-CM

## 2020-09-30 DIAGNOSIS — M51369 Other intervertebral disc degeneration, lumbar region without mention of lumbar back pain or lower extremity pain: Secondary | ICD-10-CM

## 2020-09-30 DIAGNOSIS — G8929 Other chronic pain: Secondary | ICD-10-CM

## 2020-09-30 MED ORDER — GADOBUTROL 1 MMOL/ML IV SOLN
6.0000 mL | Freq: Once | INTRAVENOUS | Status: AC | PRN
  Administered 2020-09-30: 6 mL via INTRAVENOUS

## 2020-10-02 ENCOUNTER — Inpatient Hospital Stay: Payer: Medicare Other | Admitting: Oncology

## 2020-10-02 ENCOUNTER — Inpatient Hospital Stay: Payer: Medicare Other

## 2020-10-02 NOTE — Telephone Encounter (Signed)
I faxed MRI result over now to Dr Ellene Route. Will follow up with them later today to make sure this was received.

## 2020-10-02 NOTE — Telephone Encounter (Signed)
Do you mind following up on this

## 2020-10-08 ENCOUNTER — Other Ambulatory Visit: Payer: Self-pay

## 2020-10-08 ENCOUNTER — Ambulatory Visit (INDEPENDENT_AMBULATORY_CARE_PROVIDER_SITE_OTHER): Payer: Medicare Other | Admitting: Rheumatology

## 2020-10-08 ENCOUNTER — Encounter: Payer: Self-pay | Admitting: Rheumatology

## 2020-10-08 VITALS — BP 129/79 | HR 74 | Ht 61.0 in | Wt 125.0 lb

## 2020-10-08 DIAGNOSIS — M810 Age-related osteoporosis without current pathological fracture: Secondary | ICD-10-CM | POA: Diagnosis not present

## 2020-10-08 DIAGNOSIS — M19042 Primary osteoarthritis, left hand: Secondary | ICD-10-CM

## 2020-10-08 DIAGNOSIS — M797 Fibromyalgia: Secondary | ICD-10-CM | POA: Diagnosis not present

## 2020-10-08 DIAGNOSIS — F419 Anxiety disorder, unspecified: Secondary | ICD-10-CM

## 2020-10-08 DIAGNOSIS — F5104 Psychophysiologic insomnia: Secondary | ICD-10-CM

## 2020-10-08 DIAGNOSIS — M19012 Primary osteoarthritis, left shoulder: Secondary | ICD-10-CM

## 2020-10-08 DIAGNOSIS — M5030 Other cervical disc degeneration, unspecified cervical region: Secondary | ICD-10-CM | POA: Diagnosis not present

## 2020-10-08 DIAGNOSIS — M170 Bilateral primary osteoarthritis of knee: Secondary | ICD-10-CM

## 2020-10-08 DIAGNOSIS — M5136 Other intervertebral disc degeneration, lumbar region: Secondary | ICD-10-CM

## 2020-10-08 DIAGNOSIS — Z803 Family history of malignant neoplasm of breast: Secondary | ICD-10-CM

## 2020-10-08 DIAGNOSIS — Z8639 Personal history of other endocrine, nutritional and metabolic disease: Secondary | ICD-10-CM

## 2020-10-08 DIAGNOSIS — Z800 Family history of malignant neoplasm of digestive organs: Secondary | ICD-10-CM

## 2020-10-08 DIAGNOSIS — M19011 Primary osteoarthritis, right shoulder: Secondary | ICD-10-CM

## 2020-10-08 DIAGNOSIS — M19041 Primary osteoarthritis, right hand: Secondary | ICD-10-CM

## 2020-10-08 DIAGNOSIS — Z8041 Family history of malignant neoplasm of ovary: Secondary | ICD-10-CM

## 2020-10-08 DIAGNOSIS — F32A Depression, unspecified: Secondary | ICD-10-CM

## 2020-10-08 NOTE — Patient Instructions (Signed)
Back Exercises The following exercises strengthen the muscles that help to support the trunk and back. They also help to keep the lower back flexible. Doing these exercises can help to prevent back pain or lessen existing pain. If you have back pain or discomfort, try doing these exercises 2-3 times each day or as told by your health care provider. As your pain improves, do them once each day, but increase the number of times that you repeat the steps for each exercise (do more repetitions). To prevent the recurrence of back pain, continue to do these exercises once each day or as told by your health care provider. Do exercises exactly as told by your health care provider and adjust them as directed. It is normal to feel mild stretching, pulling, tightness, or discomfort as you do these exercises, but you should stop right away if youfeel sudden pain or your pain gets worse. Exercises Single knee to chest Repeat these steps 3-5 times for each leg: Lie on your back on a firm bed or the floor with your legs extended. Bring one knee to your chest. Your other leg should stay extended and in contact with the floor. Hold your knee in place by grabbing your knee or thigh with both hands and hold. Pull on your knee until you feel a gentle stretch in your lower back or buttocks. Hold the stretch for 10-30 seconds. Slowly release and straighten your leg. Pelvic tilt Repeat these steps 5-10 times: Lie on your back on a firm bed or the floor with your legs extended. Bend your knees so they are pointing toward the ceiling and your feet are flat on the floor. Tighten your lower abdominal muscles to press your lower back against the floor. This motion will tilt your pelvis so your tailbone points up toward the ceiling instead of pointing to your feet or the floor. With gentle tension and even breathing, hold this position for 5-10 seconds. Cat-cow Repeat these steps until your lower back becomes more  flexible: Get into a hands-and-knees position on a firm surface. Keep your hands under your shoulders, and keep your knees under your hips. You may place padding under your knees for comfort. Let your head hang down toward your chest. Contract your abdominal muscles and point your tailbone toward the floor so your lower back becomes rounded like the back of a cat. Hold this position for 5 seconds. Slowly lift your head, let your abdominal muscles relax and point your tailbone up toward the ceiling so your back forms a sagging arch like the back of a cow. Hold this position for 5 seconds.  Press-ups Repeat these steps 5-10 times: Lie on your abdomen (face-down) on the floor. Place your palms near your head, about shoulder-width apart. Keeping your back as relaxed as possible and keeping your hips on the floor, slowly straighten your arms to raise the top half of your body and lift your shoulders. Do not use your back muscles to raise your upper torso. You may adjust the placement of your hands to make yourself more comfortable. Hold this position for 5 seconds while you keep your back relaxed. Slowly return to lying flat on the floor.  Bridges Repeat these steps 10 times: Lie on your back on a firm surface. Bend your knees so they are pointing toward the ceiling and your feet are flat on the floor. Your arms should be flat at your sides, next to your body. Tighten your buttocks muscles and lift your   buttocks off the floor until your waist is at almost the same height as your knees. You should feel the muscles working in your buttocks and the back of your thighs. If you do not feel these muscles, slide your feet 1-2 inches farther away from your buttocks. Hold this position for 3-5 seconds. Slowly lower your hips to the starting position, and allow your buttocks muscles to relax completely. If this exercise is too easy, try doing it with your arms crossed over yourchest. Abdominal  crunches Repeat these steps 5-10 times: Lie on your back on a firm bed or the floor with your legs extended. Bend your knees so they are pointing toward the ceiling and your feet are flat on the floor. Cross your arms over your chest. Tip your chin slightly toward your chest without bending your neck. Tighten your abdominal muscles and slowly raise your trunk (torso) high enough to lift your shoulder blades a tiny bit off the floor. Avoid raising your torso higher than that because it can put too much stress on your low back and does not help to strengthen your abdominal muscles. Slowly return to your starting position. Back lifts Repeat these steps 5-10 times: Lie on your abdomen (face-down) with your arms at your sides, and rest your forehead on the floor. Tighten the muscles in your legs and your buttocks. Slowly lift your chest off the floor while you keep your hips pressed to the floor. Keep the back of your head in line with the curve in your back. Your eyes should be looking at the floor. Hold this position for 3-5 seconds. Slowly return to your starting position. Contact a health care provider if: Your back pain or discomfort gets much worse when you do an exercise. Your worsening back pain or discomfort does not lessen within 2 hours after you exercise. If you have any of these problems, stop doing these exercises right away. Do not do them again unless your health care provider says that you can. Get help right away if: You develop sudden, severe back pain. If this happens, stop doing the exercises right away. Do not do them again unless your health care provider says that you can. This information is not intended to replace advice given to you by your health care provider. Make sure you discuss any questions you have with your healthcare provider. Document Revised: 06/29/2018 Document Reviewed: 11/24/2017 Elsevier Patient Education  2022 [employer] Shoulder Exercises Ask your  health care provider which exercises are safe for you. Do exercises exactly as told by your health care provider and adjust them as directed. It is normal to feel mild stretching, pulling, tightness, or discomfort as you do these exercises. Stop right away if you feel sudden pain or your pain gets worse. Do not begin these exercises until told by your health care provider. Stretching exercises External rotation and abduction This exercise is sometimes called corner stretch. This exercise rotates your arm outward (external rotation) and moves your arm out from your body (abduction). Stand in a doorway with one of your feet slightly in front of the other. This is called a staggered stance. If you cannot reach your forearms to the door frame, stand facing a corner of a room. Choose one of the following positions as told by your health care provider: Place your hands and forearms on the door frame above your head. Place your hands and forearms on the door frame at the height of your head. Place your hands   on the door frame at the height of your elbows. Slowly move your weight onto your front foot until you feel a stretch across your chest and in the front of your shoulders. Keep your head and chest upright and keep your abdominal muscles tight. Hold for __________ seconds. To release the stretch, shift your weight to your back foot. Repeat __________ times. Complete this exercise __________ times a day. Extension, standing Stand and hold a broomstick, a cane, or a similar object behind your back. Your hands should be a little wider than shoulder width apart. Your palms should face away from your back. Keeping your elbows straight and your shoulder muscles relaxed, move the stick away from your body until you feel a stretch in your shoulders (extension). Avoid shrugging your shoulders while you move the stick. Keep your shoulder blades tucked down toward the middle of your back. Hold for __________  seconds. Slowly return to the starting position. Repeat __________ times. Complete this exercise __________ times a day. Range-of-motion exercises Pendulum  Stand near a wall or a surface that you can hold onto for balance. Bend at the waist and let your left / right arm hang straight down. Use your other arm to support you. Keep your back straight and do not lock your knees. Relax your left / right arm and shoulder muscles, and move your hips and your trunk so your left / right arm swings freely. Your arm should swing because of the motion of your body, not because you are using your arm or shoulder muscles. Keep moving your hips and trunk so your arm swings in the following directions, as told by your health care provider: Side to side. Forward and backward. In clockwise and counterclockwise circles. Continue each motion for __________ seconds, or for as long as told by your health care provider. Slowly return to the starting position. Repeat __________ times. Complete this exercise __________ times a day. Shoulder flexion, standing  Stand and hold a broomstick, a cane, or a similar object. Place your hands a little more than shoulder width apart on the object. Your left / right hand should be palm up, and your other hand should be palm down. Keep your elbow straight and your shoulder muscles relaxed. Push the stick up with your healthy arm to raise your left / right arm in front of your body, and then over your head until you feel a stretch in your shoulder (flexion). Avoid shrugging your shoulder while you raise your arm. Keep your shoulder blade tucked down toward the middle of your back. Hold for __________ seconds. Slowly return to the starting position. Repeat __________ times. Complete this exercise __________ times a day. Shoulder abduction, standing Stand and hold a broomstick, a cane, or a similar object. Place your hands a little more than shoulder width apart on the object.  Your left / right hand should be palm up, and your other hand should be palm down. Keep your elbow straight and your shoulder muscles relaxed. Push the object across your body toward your left / right side. Raise your left / right arm to the side of your body (abduction) until you feel a stretch in your shoulder. Do not raise your arm above shoulder height unless your health care provider tells you to do that. If directed, raise your arm over your head. Avoid shrugging your shoulder while you raise your arm. Keep your shoulder blade tucked down toward the middle of your back. Hold for __________ seconds. Slowly return   to the starting position. Repeat __________ times. Complete this exercise __________ times a day. Internal rotation  Place your left / right hand behind your back, palm up. Use your other hand to dangle an exercise band, a towel, or a similar object over your shoulder. Grasp the band with your left / right hand so you are holding on to both ends. Gently pull up on the band until you feel a stretch in the front of your left / right shoulder. The movement of your arm toward the center of your body is called internal rotation. Avoid shrugging your shoulder while you raise your arm. Keep your shoulder blade tucked down toward the middle of your back. Hold for __________ seconds. Release the stretch by letting go of the band and lowering your hands. Repeat __________ times. Complete this exercise __________ times a day. Strengthening exercises External rotation  Sit in a stable chair without armrests. Secure an exercise band to a stable object at elbow height on your left / right side. Place a soft object, such as a folded towel or a small pillow, between your left / right upper arm and your body to move your elbow about 4 inches (10 cm) away from your side. Hold the end of the exercise band so it is tight and there is no slack. Keeping your elbow pressed against the soft object,  slowly move your forearm out, away from your abdomen (external rotation). Keep your body steady so only your forearm moves. Hold for __________ seconds. Slowly return to the starting position. Repeat __________ times. Complete this exercise __________ times a day. Shoulder abduction  Sit in a stable chair without armrests, or stand up. Hold a __________ weight in your left / right hand, or hold an exercise band with both hands. Start with your arms straight down and your left / right palm facing in, toward your body. Slowly lift your left / right hand out to your side (abduction). Do not lift your hand above shoulder height unless your health care provider tells you that this is safe. Keep your arms straight. Avoid shrugging your shoulder while you do this movement. Keep your shoulder blade tucked down toward the middle of your back. Hold for __________ seconds. Slowly lower your arm, and return to the starting position. Repeat __________ times. Complete this exercise __________ times a day. Shoulder extension Sit in a stable chair without armrests, or stand up. Secure an exercise band to a stable object in front of you so it is at shoulder height. Hold one end of the exercise band in each hand. Your palms should face each other. Straighten your elbows and lift your hands up to shoulder height. Step back, away from the secured end of the exercise band, until the band is tight and there is no slack. Squeeze your shoulder blades together as you pull your hands down to the sides of your thighs (extension). Stop when your hands are straight down by your sides. Do not let your hands go behind your body. Hold for __________ seconds. Slowly return to the starting position. Repeat __________ times. Complete this exercise __________ times a day. Shoulder row Sit in a stable chair without armrests, or stand up. Secure an exercise band to a stable object in front of you so it is at waist  height. Hold one end of the exercise band in each hand. Position your palms so that your thumbs are facing the ceiling (neutral position). Bend each of your elbows to a   90-degree angle (right angle) and keep your upper arms at your sides. Step back until the band is tight and there is no slack. Slowly pull your elbows back behind you. Hold for __________ seconds. Slowly return to the starting position. Repeat __________ times. Complete this exercise __________ times a day. Shoulder press-ups  Sit in a stable chair that has armrests. Sit upright, with your feet flat on the floor. Put your hands on the armrests so your elbows are bent and your fingers are pointing forward. Your hands should be about even with the sides of your body. Push down on the armrests and use your arms to lift yourself off the chair. Straighten your elbows and lift yourself up as much as you comfortably can. Move your shoulder blades down, and avoid letting your shoulders move up toward your ears. Keep your feet on the ground. As you get stronger, your feet should support less of your body weight as you lift yourself up. Hold for __________ seconds. Slowly lower yourself back into the chair. Repeat __________ times. Complete this exercise __________ times a day. Wall push-ups  Stand so you are facing a stable wall. Your feet should be about one arm-length away from the wall. Lean forward and place your palms on the wall at shoulder height. Keep your feet flat on the floor as you bend your elbows and lean forward toward the wall. Hold for __________ seconds. Straighten your elbows to push yourself back to the starting position. Repeat __________ times. Complete this exercise __________ times a day. This information is not intended to replace advice given to you by your health care provider. Make sure you discuss any questions you have with your healthcare provider. Document Revised: 06/16/2018 Document Reviewed:  03/24/2018 Elsevier Patient Education  2022 [employer]  

## 2020-10-24 ENCOUNTER — Other Ambulatory Visit: Payer: Medicare Other

## 2020-10-24 ENCOUNTER — Ambulatory Visit: Payer: Medicare Other | Admitting: Oncology

## 2020-11-12 DIAGNOSIS — Z23 Encounter for immunization: Secondary | ICD-10-CM | POA: Diagnosis not present

## 2020-11-17 ENCOUNTER — Telehealth (INDEPENDENT_AMBULATORY_CARE_PROVIDER_SITE_OTHER): Payer: Medicare Other | Admitting: Family Medicine

## 2020-11-17 ENCOUNTER — Encounter: Payer: Self-pay | Admitting: Family Medicine

## 2020-11-17 VITALS — BP 123/74 | Wt 111.0 lb

## 2020-11-17 DIAGNOSIS — U071 COVID-19: Secondary | ICD-10-CM

## 2020-11-17 NOTE — Progress Notes (Signed)
    I connected with Tracy Estrada on 11/17/20 at 12:00 PM EDT by video and verified that I am speaking with the correct person using two identifiers.   I discussed the limitations, risks, security and privacy concerns of performing an evaluation and management service by video and the availability of in person appointments. I also discussed with the patient that there may be a patient responsible charge related to this service. The patient expressed understanding and agreed to proceed.  Patient location: Home Provider Location: [facility] Participants: Lesleigh Noe and Tracy Estrada   Subjective:     Tracy Estrada is a 70 y.o. female presenting for Covid Positive (Onset of sx: 11/12/20. Got flu shot same day. Tested + on 11/13/20), Headache, and Fatigue     Headache    #Covid-19 infection - Flu shot on 11/12/2020 - Chills on 11/12/2020 - then headaches and fatigue - was in bed for a few days - sore throat and coughing but that is improved  - continues to have chills - has some chronic fatigue/body aches - which is contributing to joint pain  - continues to have headaches   Review of Systems  Neurological:  Positive for headaches.    Social History   Tobacco Use  Smoking Status Never  Smokeless Tobacco Never        Objective:   BP Readings from Last 3 Encounters:  11/17/20 123/74  10/08/20 129/79  09/09/20 130/75   Wt Readings from Last 3 Encounters:  11/17/20 111 lb (50.3 kg)  10/08/20 125 lb (56.7 kg)  09/09/20 126 lb (57.2 kg)   BP 123/74   Wt 111 lb (50.3 kg)   BMI 20.97 kg/m   Physical Exam Constitutional:      General: She is not in acute distress.    Appearance: She is well-developed. She is not diaphoretic.  HENT:     Right Ear: External ear normal.     Left Ear: External ear normal.     Nose: Nose normal.  Eyes:     Conjunctiva/sclera: Conjunctivae normal.  Cardiovascular:     Rate and Rhythm: Normal rate.  Pulmonary:     Effort:  Pulmonary effort is normal.  Musculoskeletal:     Cervical back: Neck supple.  Skin:    General: Skin is warm and dry.     Capillary Refill: Capillary refill takes less than 2 seconds.  Neurological:     Mental Status: She is alert. Mental status is at baseline.  Psychiatric:        Mood and Affect: Mood normal.        Behavior: Behavior normal.          Assessment & Plan:   Problem List Items Addressed This Visit   None Visit Diagnoses     COVID-19 virus infection    -  Primary      Patient is at increased risk for developing severe covid due to age. They are eligible for anti-viral medication.   Though discussed she is on day 5 with improving symptoms. At this time will not initiate anti-viral medication  Continue symptomatic care  Medication sent to pharmacy  Reviewed ER and return precautions  Reviewed isolation guidelines.    Return if symptoms worsen or fail to improve.  Lesleigh Noe, MD

## 2020-11-17 NOTE — Patient Instructions (Signed)
Can wear mask starting 11/18/2020 Can end isolation and use regular precautions 11/23/2020  Can consider retesting with home test prior to the wedding and wearing a mask if still positive.

## 2020-12-05 DIAGNOSIS — M5126 Other intervertebral disc displacement, lumbar region: Secondary | ICD-10-CM | POA: Diagnosis not present

## 2020-12-05 DIAGNOSIS — R030 Elevated blood-pressure reading, without diagnosis of hypertension: Secondary | ICD-10-CM | POA: Diagnosis not present

## 2020-12-16 ENCOUNTER — Telehealth: Payer: Self-pay | Admitting: Primary Care

## 2020-12-16 NOTE — Telephone Encounter (Signed)
LVM for pt to rtn my call to r/s appt with [facility] on 01/27/21

## 2021-01-21 DIAGNOSIS — H2513 Age-related nuclear cataract, bilateral: Secondary | ICD-10-CM | POA: Diagnosis not present

## 2021-01-27 ENCOUNTER — Ambulatory Visit: Payer: Medicare Other

## 2021-02-06 DIAGNOSIS — H903 Sensorineural hearing loss, bilateral: Secondary | ICD-10-CM | POA: Diagnosis not present

## 2021-02-06 DIAGNOSIS — H6121 Impacted cerumen, right ear: Secondary | ICD-10-CM | POA: Diagnosis not present

## 2021-02-06 DIAGNOSIS — H90A21 Sensorineural hearing loss, unilateral, right ear, with restricted hearing on the contralateral side: Secondary | ICD-10-CM | POA: Diagnosis not present

## 2021-03-10 ENCOUNTER — Telehealth: Payer: Self-pay | Admitting: Oncology

## 2021-03-10 NOTE — Telephone Encounter (Signed)
Pt called to make an appt to see Dr. Janese Banks . Call back at [phone]

## 2021-03-11 NOTE — Telephone Encounter (Signed)
Spoke with patient and got her rescheduled.

## 2021-03-12 ENCOUNTER — Telehealth: Payer: Self-pay

## 2021-03-12 NOTE — Telephone Encounter (Signed)
Received order for dexa patient last seen in office on 11/21. I have called patient to let her know that we will need to see her in office before we can send order or provide refills that will be due soon. She declined to make appointment will call office back next week when she is back in town. If no appointment made by 1/12 will call back.

## 2021-03-17 NOTE — Telephone Encounter (Signed)
Mrs. Juniel called in returning [place] phone call.

## 2021-03-17 NOTE — Telephone Encounter (Signed)
Patient called and made appointment aware of location and time.

## 2021-03-18 ENCOUNTER — Telehealth: Payer: Self-pay

## 2021-03-18 NOTE — Telephone Encounter (Signed)
Benefits submitted-waiting. Next injection due after 03/14/21  Tracy Estrada, patient has an appointment with you this month. Is it ok for her to come ahead of time for blood work so she can go ahead and get her Prolia done during an office visit? She has not been seen since 01/2020.

## 2021-03-19 NOTE — Telephone Encounter (Signed)
Noted  

## 2021-03-19 NOTE — Telephone Encounter (Signed)
It looks like her bone density scan is overdue, so let me meet with her first before we provide her with a Prolia injection.  She will need to update her bone density scan first.

## 2021-03-24 ENCOUNTER — Encounter: Payer: Self-pay | Admitting: Primary Care

## 2021-03-24 ENCOUNTER — Other Ambulatory Visit: Payer: Self-pay

## 2021-03-24 ENCOUNTER — Ambulatory Visit (INDEPENDENT_AMBULATORY_CARE_PROVIDER_SITE_OTHER): Payer: Medicare Other | Admitting: Primary Care

## 2021-03-24 VITALS — BP 138/82 | HR 81 | Temp 97.6°F | Ht 61.0 in | Wt 121.0 lb

## 2021-03-24 DIAGNOSIS — F5104 Psychophysiologic insomnia: Secondary | ICD-10-CM | POA: Diagnosis not present

## 2021-03-24 DIAGNOSIS — M797 Fibromyalgia: Secondary | ICD-10-CM | POA: Diagnosis not present

## 2021-03-24 DIAGNOSIS — D709 Neutropenia, unspecified: Secondary | ICD-10-CM | POA: Diagnosis not present

## 2021-03-24 DIAGNOSIS — F419 Anxiety disorder, unspecified: Secondary | ICD-10-CM

## 2021-03-24 DIAGNOSIS — M25511 Pain in right shoulder: Secondary | ICD-10-CM

## 2021-03-24 DIAGNOSIS — G8929 Other chronic pain: Secondary | ICD-10-CM

## 2021-03-24 DIAGNOSIS — M810 Age-related osteoporosis without current pathological fracture: Secondary | ICD-10-CM | POA: Diagnosis not present

## 2021-03-24 DIAGNOSIS — F32A Depression, unspecified: Secondary | ICD-10-CM | POA: Diagnosis not present

## 2021-03-24 DIAGNOSIS — E785 Hyperlipidemia, unspecified: Secondary | ICD-10-CM

## 2021-03-24 LAB — COMPREHENSIVE METABOLIC PANEL
ALT: 15 U/L (ref 0–35)
AST: 17 U/L (ref 0–37)
Albumin: 4.5 g/dL (ref 3.5–5.2)
Alkaline Phosphatase: 46 U/L (ref 39–117)
BUN: 16 mg/dL (ref 6–23)
CO2: 30 mEq/L (ref 19–32)
Calcium: 9.9 mg/dL (ref 8.4–10.5)
Chloride: 103 mEq/L (ref 96–112)
Creatinine, Ser: 0.65 mg/dL (ref 0.40–1.20)
GFR: 87.78 mL/min (ref >60.00)
Glucose, Bld: 80 mg/dL (ref 70–99)
Potassium: 3.7 mEq/L (ref 3.5–5.1)
Sodium: 139 mEq/L (ref 135–145)
Total Bilirubin: 0.4 mg/dL (ref 0.2–1.2)
Total Protein: 7.4 g/dL (ref 6.0–8.3)

## 2021-03-24 LAB — LIPID PANEL
Cholesterol: 172 mg/dL (ref 0–200)
HDL: 55.6 mg/dL (ref >39.00)
LDL Cholesterol: 96 mg/dL (ref 0–99)
NonHDL: 116.87
Total CHOL/HDL Ratio: 3
Triglycerides: 102 mg/dL (ref 0.0–149.0)
VLDL: 20.4 mg/dL (ref 0.0–40.0)

## 2021-03-24 MED ORDER — BUPROPION HCL ER (XL) 150 MG PO TB24: 300.0000 mg | ORAL_TABLET | Freq: Every day | ORAL | 3 refills | Status: AC

## 2021-03-24 NOTE — Patient Instructions (Signed)
Stop by the lab prior to leaving today. I will notify you of your results once received.   Complete your bone density scan and mammogram as scheduled.  It was a pleasure to see you today!

## 2021-03-24 NOTE — Assessment & Plan Note (Signed)
Following with hematology, office notes from January 2022 reviewed.  Follow up as scheduled.

## 2021-03-24 NOTE — Assessment & Plan Note (Signed)
Following with rheumatology, agree that PT could be beneficial. Follow up as scheduled.

## 2021-03-24 NOTE — Assessment & Plan Note (Signed)
Repeat bone density scheduled and pending.  Updating calcium and renal function today. Continue Prolia injections as long as labs are stable.  Continue calcium and vitamin D. Encouraged weight bearing exercise.

## 2021-03-24 NOTE — Assessment & Plan Note (Signed)
Denies concerns today. °Continue to monitor. °

## 2021-03-24 NOTE — Progress Notes (Signed)
Subjective:    Patient ID: Tracy Estrada, female    DOB: [birth year], 71 y.o.   MRN: [id number]  HPI  Tracy Estrada is a very pleasant 71 y.o. female with a history of fibromyalgia, hyperlipidemia, anxiety and depression, osteoporosis who presents today for follow up of chronic conditions.  Immunizations: -Tetanus: 2013 -Influenza: Completed this season -Covid-19: 2 vaccines  -Shingles: Completed Shingrix and Zostavax -Pneumonia: Prevnar 13 in 2016, Pneumovax in 2018  Mammogram: Completed in November 2021, scheduled for tomorrow.  Dexa: Completed in November 2020, scheduled for tomorrow.  Colonoscopy: Completed in 2017, due 2027   1) Osteoporosis: Bone Density scan last completed in November 2020, improvement to AP spine, decreased density to left femur neck. She is due for repeat scan now. She is taking calcium and vitamin D, also managed on Prolia injections semi-annually and is due. She is not walking much.   2) Hyperlipidemia: Currently managed on pravastatin 40 mg. Due for repeat lipid panel.   3) Anxiety and Depression: Currently managed on bupropion XL 150 mg, 2 tablets. Overall feels well managed. Is needing refills.   BP Readings from Last 3 Encounters:  03/24/21 138/82  11/17/20 123/74  10/08/20 129/79   4) Chronic Shoulder Pain/Chronic Back Pain/Fibromyalgia: Chronic to bilateral shoulders, pain with most movement and touch. She has difficulty getting in and out of the car, walking long distances. Is requesting a handicap placard.   Following with rheumatology, last visit was in August 2022, will be seen next month and will be requesting to starting physical therapy.   5) Neutropenia: Following with hematology, last visit was in January 2022, neutropenia had resolved at that time, vitamin B12 levels found to be mildly low. Oral B12 1000 mcg were initiated. She is scheduled for follow up in February 2023. No concerns today.    Review of Systems  Respiratory:   Negative for shortness of breath.   Cardiovascular:  Negative for chest pain.  Musculoskeletal:  Positive for arthralgias and myalgias.  Neurological:  Negative for dizziness and headaches.  Psychiatric/Behavioral:  The patient is not nervous/anxious.         Past Medical History:  Diagnosis Date   Achilles bursitis or tendinitis    Anxiety state, unspecified    Arthritis    Closed fracture of unspecified part of fibula    Depressive disorder, not elsewhere classified    Diverticulosis of colon (without mention of hemorrhage)    Edema    Fibromyalgia    Insomnia, unspecified    Lumbago    Mass of left ovary 06/01/2016   Meniere's disease 1986   MVP (mitral valve prolapse)    Other intra-abdominal and pelvic swelling, mass and lump 06/01/2016   Pain in joint, site unspecified    Peptic ulcer    no testing   Pure hypercholesterolemia    TSH elevation     Social History   Socioeconomic History   Marital status: Married    Spouse name: Sybol Morre   Number of children: 2   Years of education: Not on file   Highest education level: Not on file  Occupational History   Occupation: retired    Comment: Network engineer  Tobacco Use   Smoking status: Never   Smokeless tobacco: Never  Vaping Use   Vaping Use: Never used  Substance and Sexual Activity   Alcohol use: Yes    Alcohol/week: 0.0 standard drinks    Comment: 3-4 beers per week or wine   Drug use: No  Sexual activity: Yes    Birth control/protection: Surgical  Other Topics Concern   Not on file  Social History Narrative   Marital status:  Married x 14 years; second marriage;happily married,husband has PTSD.     Children: 2 daughters [name], [name]); 7 grandchildren; 2 gg      Lives: with husband, dog/beagle      Employment: retired since 2013; Network engineer      Tobacco: never      Alcohol: weekends; 1-2 glasses of wine per week or beer.      Drugs:  None      Exercise: none in 2018      Seatbelt: 100%      Guns:  unloaded.     Caffeine not every day. 7 grandchildren and 2 GG. Organ donor NO.      ADLs: independent with ADLs; drives      Advanced Directives: YES; FULL CODE no prolonged measures.   Social Determinants of Health   Financial Resource Strain: Not on file  Food Insecurity: Not on file  Transportation Needs: Not on file  Physical Activity: Not on file  Stress: Not on file  Social Connections: Not on file  Intimate Partner Violence: Not on file    Past Surgical History:  Procedure Laterality Date   ABDOMINAL HYSTERECTOMY  03/08/1980   fibroids; ovaries intact.   BACK SURGERY     x2 Cervical and lumbar   CYSTOSCOPY N/A 06/08/2016   Procedure: CYSTOSCOPY;  Surgeon: Will Bonnet, MD;  Location: [facility];  Service: Gynecology;  Laterality: N/A;   LAPAROSCOPIC LYSIS OF ADHESIONS  06/08/2016   Procedure: LAPAROSCOPIC LYSIS OF ADHESIONS;  Surgeon: Will Bonnet, MD;  Location: [facility];  Service: Gynecology;;   LAPAROSCOPIC SALPINGO OOPHERECTOMY Left 06/08/2016   Procedure: LAPAROSCOPIC SALPINGO OOPHORECTOMY;  Surgeon: Will Bonnet, MD;  Location: [facility];  Service: Gynecology;  Laterality: Left;   OOPHORECTOMY     left ovary   SALPINGECTOMY     left   SPINE SURGERY     lumbar spine 11/2015; cervical spine 2009. [facility]ner.    Family History  Problem Relation Age of Onset   Hyperlipidemia Mother    Hypertension Mother    Colon polyps Mother    Coronary artery disease Father         AMI age 31   Heart disease Father 92       AMI x 2; first AMI in 77s   Breast cancer Sister 97       with metastasis to bone; smoker   Cancer Sister 16       breast cancer   Colon cancer Maternal Uncle        dx. >50   Colon cancer Paternal Aunt        dx. 67s   Heart attack Paternal Uncle    Stroke Maternal Grandmother    Alzheimer's disease Paternal Grandmother    Heart disease Paternal Grandfather    Heart attack Paternal Grandfather    Healthy Daughter    Healthy Daughter     Breast cancer Cousin    Ovarian cancer Cousin        dx. late 70s/dx. 84s   Lung cancer Cousin        dx. 70s   Stomach cancer Other     Allergies  Allergen Reactions   Demerol [Meperidine] Nausea And Vomiting   Kenalog [Triamcinolone Acetonide] Other (See Comments)    Cause vertigo/dizziness   Adhesive [Tape] Rash  and Other (See Comments)    PAPER TAPE OK    Current Outpatient Medications on File Prior to Visit  Medication Sig Dispense Refill   acetaminophen (TYLENOL) 325 MG tablet Take 650 mg by mouth every 6 (six) hours as needed.     Capsaicin-Menthol (SALONPAS GEL-PATCH HOT EX) Apply topically.     Cholecalciferol (VITAMIN D3 PO) Take by mouth.     ELDERBERRY PO Take by mouth.     fluticasone (FLONASE) 50 MCG/ACT nasal spray Place 2 sprays into the nose daily as needed for allergies.      Misc Natural Products (NEURIVA PO) Take by mouth.     Multiple Vitamins-Minerals (MULTIVITAMIN WITH MINERALS) tablet Take 1 tablet by mouth daily. Centrum Silver for Women     Omega-3 Fatty Acids (FISH OIL) 500 MG CAPS Take 500 mg by mouth daily.     OVER THE COUNTER MEDICATION      POTASSIUM PO Take by mouth.     pravastatin (PRAVACHOL) 40 MG tablet TAKE 1 TABLET BY MOUTH EVERY DAY FOR CHOLESTEROL 90 tablet 3   No current facility-administered medications on file prior to visit.    BP 138/82    Pulse 81    Temp 97.6 F (36.4 C) (Temporal)    Ht 5\' 1"  (1.549 m)    Wt 121 lb (54.9 kg)    SpO2 96%    BMI 22.86 kg/m  Objective:   Physical Exam Cardiovascular:     Rate and Rhythm: Normal rate and regular rhythm.  Pulmonary:     Effort: Pulmonary effort is normal.     Breath sounds: Normal breath sounds.  Musculoskeletal:     Cervical back: Neck supple.  Skin:    General: Skin is warm and dry.  Psychiatric:        Mood and Affect: Mood normal.          Assessment & Plan:      This visit occurred during the SARS-CoV-2 public health emergency.  Safety protocols were in  place, including screening questions prior to the visit, additional usage of staff PPE, and extensive cleaning of exam room while observing appropriate contact time as indicated for disinfecting solutions.

## 2021-03-24 NOTE — Assessment & Plan Note (Addendum)
Following with rheumatology. Office notes reviewed in chart from August 2022.  Handicap placard completed today.  Follow up as scheduled.

## 2021-03-24 NOTE — Assessment & Plan Note (Signed)
Overall doing well on bupropion XL 150 mg (2 tabs), continue same. Refills provided.

## 2021-03-24 NOTE — Assessment & Plan Note (Signed)
Continue pravastatin 40 mg. Repeat lipid panel pending.

## 2021-03-25 ENCOUNTER — Encounter: Payer: Self-pay | Admitting: Primary Care

## 2021-03-25 DIAGNOSIS — Z780 Asymptomatic menopausal state: Secondary | ICD-10-CM | POA: Diagnosis not present

## 2021-03-25 DIAGNOSIS — M8589 Other specified disorders of bone density and structure, multiple sites: Secondary | ICD-10-CM | POA: Diagnosis not present

## 2021-03-25 DIAGNOSIS — Z1231 Encounter for screening mammogram for malignant neoplasm of breast: Secondary | ICD-10-CM | POA: Diagnosis not present

## 2021-03-27 ENCOUNTER — Encounter: Payer: Self-pay | Admitting: Primary Care

## 2021-03-27 ENCOUNTER — Other Ambulatory Visit: Payer: Self-pay

## 2021-03-27 ENCOUNTER — Telehealth (INDEPENDENT_AMBULATORY_CARE_PROVIDER_SITE_OTHER): Payer: Medicare Other | Admitting: Primary Care

## 2021-03-27 DIAGNOSIS — F32A Depression, unspecified: Secondary | ICD-10-CM | POA: Diagnosis not present

## 2021-03-27 DIAGNOSIS — F419 Anxiety disorder, unspecified: Secondary | ICD-10-CM

## 2021-03-27 MED ORDER — BUSPIRONE HCL 5 MG PO TABS: 5.0000 mg | ORAL_TABLET | Freq: Two times a day (BID) | ORAL | 0 refills | Status: DC

## 2021-03-27 NOTE — Assessment & Plan Note (Signed)
Anxiety grossly untreated.  Discussed options, we will start by continuing her bupropion XL 300 mg daily, and add buspirone 5 mg twice daily for anxiety.  She will update in a few weeks.

## 2021-03-27 NOTE — Patient Instructions (Signed)
Continue bupropion (Wellbutrin) XL 300 mg daily for depression.  Start buspirone (BuSpar) 5 mg for anxiety.  Start by taking 1 tablet by mouth once daily for 5 days, then increase to twice daily thereafter.  Please update me as discussed.  It was a pleasure to see you today!

## 2021-03-27 NOTE — Progress Notes (Signed)
Patient ID: Tracy Estrada, female    DOB: 1951/01/22, 71 y.o.   MRN: [id number]  Virtual visit completed through [employer], a video enabled telemedicine application. Due to national recommendations of social distancing due to COVID-19, a virtual visit is felt to be most appropriate for this patient at this time. Reviewed limitations, risks, security and privacy concerns of performing a virtual visit and the availability of in person appointments. I also reviewed that there may be a patient responsible charge related to this service. The patient agreed to proceed.   Patient location: home Provider location: [place] at [facility], office Persons participating in this virtual visit: patient, provider   If any vitals were documented, they were collected by patient at home unless specified below.    There were no vitals taken for this visit.   CC: Anxiety and Depression  Subjective:   HPI: Tracy Estrada is a 71 y.o. female with a history of anxiety and depression, caregiver strain presenting on 03/27/2021 for Anxiety (See mychart message)  Symptoms include feeling agitated, constantly worries, feels anxious.  She does feel sad/down at times.  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 She is also dealing with her own medical problems including the pain in her shoulders, disturbed vision from cataracts, and decreased hearing.  She will likely need hearing aids soon.  She is managed on bupropion XL 300 since 2019, isn't sure if the mediation is effective, it was initially. She was initiated on fluoxetine 20 mg, in conjunction with bupropion, over 1 year ago but it caused hallucinations.  She is requesting an additional medicine to help with her anxiety and nerves.       Relevant past medical, surgical, family and social history reviewed and updated as indicated. Interim medical history since our last visit reviewed. Allergies and medications reviewed and  updated. Outpatient Medications Prior to Visit  Medication Sig Dispense Refill   acetaminophen (TYLENOL) 325 MG tablet Take 650 mg by mouth every 6 (six) hours as needed.     buPROPion (WELLBUTRIN XL) 150 MG 24 hr tablet Take 2 tablets (300 mg total) by mouth daily. For depression. 180 tablet 3   Capsaicin-Menthol (SALONPAS GEL-PATCH HOT EX) Apply topically.     Cholecalciferol (VITAMIN D3 PO) Take by mouth.     ELDERBERRY PO Take by mouth.     fluticasone (FLONASE) 50 MCG/ACT nasal spray Place 2 sprays into the nose daily as needed for allergies.      Misc Natural Products (NEURIVA PO) Take by mouth.     Multiple Vitamins-Minerals (MULTIVITAMIN WITH MINERALS) tablet Take 1 tablet by mouth daily. Centrum Silver for Women     Omega-3 Fatty Acids (FISH OIL) 500 MG CAPS Take 500 mg by mouth daily.     POTASSIUM PO Take by mouth.     pravastatin (PRAVACHOL) 40 MG tablet TAKE 1 TABLET BY MOUTH EVERY DAY FOR CHOLESTEROL 90 tablet 3   vitamin B-12 (CYANOCOBALAMIN) 1000 MCG tablet Take 1,000 mcg by mouth daily.     OVER THE COUNTER MEDICATION      No facility-administered medications prior to visit.     Per HPI unless specifically indicated in ROS section below Review of Systems  Constitutional:  Positive for fatigue.  Musculoskeletal:  Positive for arthralgias.  Psychiatric/Behavioral:  The patient is nervous/anxious.   Objective:  There were no vitals taken for this visit.  Wt Readings from Last 3 Encounters:  03/24/21 121  lb (54.9 kg)  11/17/20 111 lb (50.3 kg)  10/08/20 125 lb (56.7 kg)       Physical exam: General: Alert and oriented x 3, no distress, does not appear sickly  Pulmonary: Speaks in complete sentences without increased work of breathing, no cough during visit.  Psychiatric: Normal mood, thought content, and behavior.     Results for orders placed or performed in visit on 03/24/21  Lipid panel  Result Value Ref Range   Cholesterol 172 0 - 200 mg/dL    Triglycerides 102.0 0.0 - 149.0 mg/dL   HDL 55.60 >39.00 mg/dL   VLDL 20.4 0.0 - 40.0 mg/dL   LDL Cholesterol 96 0 - 99 mg/dL   Total CHOL/HDL Ratio 3    NonHDL 116.87   Comprehensive metabolic panel  Result Value Ref Range   Sodium 139 135 - 145 mEq/L   Potassium 3.7 3.5 - 5.1 mEq/L   Chloride 103 96 - 112 mEq/L   CO2 30 19 - 32 mEq/L   Glucose, Bld 80 70 - 99 mg/dL   BUN 16 6 - 23 mg/dL   Creatinine, Ser 0.65 0.40 - 1.20 mg/dL   Total Bilirubin 0.4 0.2 - 1.2 mg/dL   Alkaline Phosphatase 46 39 - 117 U/L   AST 17 0 - 37 U/L   ALT 15 0 - 35 U/L   Total Protein 7.4 6.0 - 8.3 g/dL   Albumin 4.5 3.5 - 5.2 g/dL   GFR 87.78 >60.00 mL/min   Calcium 9.9 8.4 - 10.5 mg/dL   Assessment & Plan:   Problem List Items Addressed This Visit       Other   Anxiety and depression - Primary    Anxiety grossly untreated.  Discussed options, we will start by continuing her bupropion XL 300 mg daily, and add buspirone 5 mg twice daily for anxiety.  She will update in a few weeks.      Relevant Medications   busPIRone (BUSPAR) 5 MG tablet     Meds ordered this encounter  Medications   busPIRone (BUSPAR) 5 MG tablet    Sig: Take 1 tablet (5 mg total) by mouth 2 (two) times daily. For anxiety    Dispense:  60 tablet    Refill:  0    Order Specific Question:   Supervising Provider    Answer:   BEDSOLE, AMY E [[id number]]   No orders of the defined types were placed in this encounter.   I discussed the assessment and treatment plan with the patient. The patient was provided an opportunity to ask questions and all were answered. The patient agreed with the plan and demonstrated an understanding of the instructions. The patient was advised to call back or seek an in-person evaluation if the symptoms worsen or if the condition fails to improve as anticipated.  Follow up plan:  Continue bupropion (Wellbutrin) XL 300 mg daily for depression.  Start buspirone (BuSpar) 5 mg for anxiety.  Start by  taking 1 tablet by mouth once daily for 5 days, then increase to twice daily thereafter.  Please update me as discussed.  It was a pleasure to see you today!   [name], NP

## 2021-04-01 NOTE — Telephone Encounter (Signed)
Reviewed bone density scan report from 2023 and compared to bone density scan report from 2020.  Recent report shows statistically significant increase in bone density of the left total hip which is an improvement compared to the report from 2020.  Continue semiannual Prolia injections as she is improving.

## 2021-04-01 NOTE — Telephone Encounter (Signed)
Requested Bone Density report from [facility] and placed it in Kate's inbox to review. Let me know if its ok to proceed with Prolia injection. Thank you

## 2021-04-02 NOTE — Telephone Encounter (Signed)
Spoke with patient and advised of comments.  CrCl is 67.8 mL/min from labs done on 03/24/21. Calcium normal at 9.9 Nurse visit scheduled at [facility] Location on 04/08/21

## 2021-04-03 NOTE — Progress Notes (Signed)
Office Visit Note  Patient: Tracy Estrada             Date of Birth: 1950-06-01           MRN: [id number]             PCP: [name], NP Referring: [name], NP Visit Date: 04/14/2021 Occupation: @GUAROCC @  Subjective:  Other (Patient reports bilateral shoulder pain. )   History of Present Illness: Tracy Estrada is a 71 y.o. female with a history of osteoarthritis, degenerative disc disease and fibromyalgia syndrome.  She has been having pain and discomfort in her bilateral shoulders.  She states the last right shoulder joint injection helped her for a few months and then symptoms came back.  Her last right shoulder joint injection was in July 2022.  She is having difficulty raising both arms.  She states that she wants to go for physical therapy.  She is not ready for total shoulder replacement yet.  Discomfort in her hands and knees is manageable.  She has some discomfort in her cervical and lumbar spine.  She also has some generalized pain from fibromyalgia.  She states Tylenol is not effective in controlling symptoms.  Activities of Daily Living:  Patient reports morning stiffness for 2-3 hours.   Patient Reports nocturnal pain.  Difficulty dressing/grooming: Reports Difficulty climbing stairs: Denies Difficulty getting out of chair: Denies Difficulty using hands for taps, buttons, cutlery, and/or writing: Denies  Review of Systems  Constitutional:  Positive for fatigue.  HENT:  Negative for mouth sores, mouth dryness and nose dryness.   Eyes:  Positive for dryness. Negative for pain and itching.  Respiratory:  Negative for shortness of breath and difficulty breathing.   Cardiovascular:  Negative for chest pain and palpitations.  Gastrointestinal:  Negative for blood in stool, constipation and diarrhea.  Endocrine: Negative for increased urination.  Genitourinary:  Negative for difficulty urinating.  Musculoskeletal:  Positive for joint pain, joint pain,  joint swelling, myalgias, morning stiffness, muscle tenderness and myalgias.  Skin:  Negative for color change, rash and redness.  Allergic/Immunologic: Negative for susceptible to infections.  Neurological:  Positive for memory loss and weakness. Negative for dizziness, numbness and headaches.  Hematological:  Negative for bruising/bleeding tendency.  Psychiatric/Behavioral:  Negative for confusion.    PMFS History:  Patient Active Problem List   Diagnosis Date Noted   Neutropenia ([place]) 01/28/2020   Chronic right shoulder pain 01/28/2020   Foul smelling urine 09/04/2019   Medicare annual wellness visit, subsequent 01/25/2019   Hyperlipidemia 01/25/2019   Osteoporosis 01/25/2019   Rash and nonspecific skin eruption 04/13/2018   Fibromyalgia 09/16/2015   Anxiety and depression 09/16/2015   Insomnia 09/16/2015   Genetic testing 03/21/2015   Family history of breast cancer in sister 02/27/2015   Family history of ovarian cancer 02/27/2015   Family history of colon cancer 02/27/2015    Past Medical History:  Diagnosis Date   Achilles bursitis or tendinitis    Anxiety state, unspecified    Arthritis    Closed fracture of unspecified part of fibula    Depressive disorder, not elsewhere classified    Diverticulosis of colon (without mention of hemorrhage)    Edema    Fibromyalgia    Insomnia, unspecified    Lumbago    Mass of left ovary 06/01/2016   Meniere's disease 1986   MVP (mitral valve prolapse)    Other intra-abdominal and pelvic swelling, mass and lump 06/01/2016   Pain  in joint, site unspecified    Peptic ulcer    no testing   Pure hypercholesterolemia    TSH elevation     Family History  Problem Relation Age of Onset   Hyperlipidemia Mother    Hypertension Mother    Colon polyps Mother    Coronary artery disease Father         AMI age 81   Heart disease Father 12       AMI x 2; first AMI in 36s   Breast cancer Sister 26       with metastasis to bone; smoker    Cancer Sister 75       breast cancer   Colon cancer Maternal Uncle        dx. >50   Colon cancer Paternal Aunt        dx. 34s   Heart attack Paternal Uncle    Stroke Maternal Grandmother    Alzheimer's disease Paternal Grandmother    Heart disease Paternal Grandfather    Heart attack Paternal Grandfather    Healthy Daughter    Healthy Daughter    Breast cancer Cousin    Ovarian cancer Cousin        dx. late 70s/dx. 2s   Lung cancer Cousin        dx. 70s   Stomach cancer Other    Past Surgical History:  Procedure Laterality Date   ABDOMINAL HYSTERECTOMY  03/08/1980   fibroids; ovaries intact.   BACK SURGERY     x2 Cervical and lumbar   CYSTOSCOPY N/A 06/08/2016   Procedure: CYSTOSCOPY;  Surgeon: Will Bonnet, MD;  Location: [facility];  Service: Gynecology;  Laterality: N/A;   LAPAROSCOPIC LYSIS OF ADHESIONS  06/08/2016   Procedure: LAPAROSCOPIC LYSIS OF ADHESIONS;  Surgeon: Will Bonnet, MD;  Location: [facility];  Service: Gynecology;;   LAPAROSCOPIC SALPINGO OOPHERECTOMY Left 06/08/2016   Procedure: LAPAROSCOPIC SALPINGO OOPHORECTOMY;  Surgeon: Will Bonnet, MD;  Location: [facility];  Service: Gynecology;  Laterality: Left;   OOPHORECTOMY     left ovary   SALPINGECTOMY     left   SPINE SURGERY     lumbar spine 11/2015; cervical spine 2009. [facility].   Social History   Social History Narrative   Marital status:  Married x 14 years; second marriage;happily married,husband has PTSD.     Children: 2 daughters [name], [name]); 7 grandchildren; 2 gg      Lives: with husband, dog/beagle      Employment: retired since 2013; Network engineer      Tobacco: never      Alcohol: weekends; 1-2 glasses of wine per week or beer.      Drugs:  None      Exercise: none in 2018      Seatbelt: 100%      Guns: unloaded.     Caffeine not every day. 7 grandchildren and 2 GG. Organ donor NO.      ADLs: independent with ADLs; drives      Advanced Directives: YES; FULL CODE no prolonged  measures.   Immunization History  Administered Date(s) Administered   Fluad Quad(high Dose 65+) 01/22/2019, 01/09/2020   Influenza,inj,Quad PF,6+ Mos 11/06/2014   Influenza-Unspecified 01/07/2016, 12/13/2016, 11/12/2020   PFIZER(Purple Top)SARS-COV-2 Vaccination 04/19/2019, 05/08/2019   Pneumococcal Conjugate-13 11/06/2014   Pneumococcal Polysaccharide-23 04/07/2016   Tdap 08/10/2011   Zoster Recombinat (Shingrix) 05/15/2018, 09/16/2018, 01/22/2019   Zoster, Live 09/06/2011     Objective: Vital Signs: BP 122/77 (BP  Location: Right Arm, Patient Position: Sitting, Cuff Size: Small)    Pulse 91    Resp 12    Ht 5\' 1"  (1.549 m)    Wt 119 lb 3.2 oz (54.1 kg)    BMI 22.52 kg/m    Physical Exam Vitals and nursing note reviewed.  Constitutional:      Appearance: She is well-developed.  HENT:     Head: Normocephalic and atraumatic.  Eyes:     Conjunctiva/sclera: Conjunctivae normal.  Cardiovascular:     Rate and Rhythm: Normal rate and regular rhythm.     Heart sounds: Normal heart sounds.  Pulmonary:     Effort: Pulmonary effort is normal.     Breath sounds: Normal breath sounds.  Abdominal:     General: Bowel sounds are normal.     Palpations: Abdomen is soft.  Musculoskeletal:     Cervical back: Normal range of motion.  Lymphadenopathy:     Cervical: No cervical adenopathy.  Skin:    General: Skin is warm and dry.     Capillary Refill: Capillary refill takes less than 2 seconds.  Neurological:     Mental Status: She is alert and oriented to person, place, and time.  Psychiatric:        Behavior: Behavior normal.     Musculoskeletal Exam: She has some limitation of range of motion of her cervical spine without discomfort.  She had limited range of motion of her lumbar spine with discomfort.  Shoulder abduction was 120 degrees bilaterally with discomfort.  No warmth swelling or effusion was noted.  Elbow joints with good range of motion.  Wrist joints in good range of motion  with no synovitis.  Bilateral PIP and DIP thickening was noted in her hands.  Hip joints and knee joints in good range of motion.  There was no tenderness over ankles or MTPs.  CDAI Exam: CDAI Score: -- Patient Global: --; Provider Global: -- Swollen: --; Tender: -- Joint Exam 04/14/2021   No joint exam has been documented for this visit   There is currently no information documented on the homunculus. Go to the Rheumatology activity and complete the homunculus joint exam.  Investigation: No additional findings.  Imaging: No results found.  Recent Labs: Lab Results  Component Value Date   WBC 4.7 03/17/2020   HGB 13.6 03/17/2020   PLT 187 03/17/2020   NA 139 03/24/2021   K 3.7 03/24/2021   CL 103 03/24/2021   CO2 30 03/24/2021   GLUCOSE 80 03/24/2021   BUN 16 03/24/2021   CREATININE 0.65 03/24/2021   BILITOT 0.4 03/24/2021   ALKPHOS 46 03/24/2021   AST 17 03/24/2021   ALT 15 03/24/2021   PROT 7.4 03/24/2021   ALBUMIN 4.5 03/24/2021   CALCIUM 9.9 03/24/2021   GFRAA >60 06/03/2016    Speciality Comments: No specialty comments available.  Procedures:  Large Joint Inj: R glenohumeral on 04/14/2021 2:01 PM Indications: pain Details: 27 G 1.5 in needle, posterior approach  Arthrogram: No  Medications: 40 mg triamcinolone acetonide 40 MG/ML; 1.5 mL lidocaine 1 % Aspirate: 0 mL Outcome: tolerated well, no immediate complications Procedure, treatment alternatives, risks and benefits explained, specific risks discussed. Consent was given by the patient. Immediately prior to procedure a time out was called to verify the correct patient, procedure, equipment, support staff and site/side marked as required. Patient was prepped and draped in the usual sterile fashion.    Allergies: Demerol [meperidine], Kenalog [triamcinolone acetonide], and Adhesive [tape]  Assessment / Plan:     Visit Diagnoses: Primary osteoarthritis of both shoulders - She has severe osteoarthritis of  the right shoulder and moderate osteoarthritis of the left shoulder joint.  She had discomfort when raising both arms.  She had shoulder joint abduction limited to 120 degrees bilaterally.  Different treatment options and side effects were discussed.  Per her request right shoulder joint was injected with cortisone as described above.  She wants to come in for left shoulder joint injection later.  I will schedule her for left shoulder joint injection in 3 months.  I referred her to physical therapy for shoulder joint exercises and muscle strengthening.  I also had a discussion with her that due to severe nature of osteoarthritis she will not gain full range of motion and will benefit from total shoulder replacement eventually.  Primary osteoarthritis of both hands - Clinical and radiographic findings are consistent with osteoarthritis.  Joint protection muscle strengthening was discussed.  Primary osteoarthritis of both knees - bilateral moderate osteoarthritis and moderate chondromalacia patella noted on the x-rays.  Lower extremity muscle strengthening exercises were discussed.  DDD (degenerative disc disease), cervical - status post fusion x1 by Dr. Ellene Route.   DDD (degenerative disc disease), lumbar - Status post fusion x2 by Dr. Ellene Route.  Fibromyalgia-she continues to have generalized pain and discomfort.  Regular exercise and stretching was emphasized.  Age-related osteoporosis without current pathological fracture - DEXA on 01/15/19: RFN BMD 0.563 with T-score -2.6.  She is on Prolia injections by her PCP.  Use of calcium rich diet with vitamin D and resistive exercises were discussed.  Other medical problems are listed as follows:  History of hyperlipidemia  Anxiety and depression  Psychophysiological insomnia  Family history of ovarian cancer  Family history of colon cancer  Family history of breast cancer in sister  Orders: Orders Placed This Encounter  Procedures   Large Joint  Inj   No orders of the defined types were placed in this encounter.   Follow-Up Instructions: Return in about 3 months (around 07/12/2021) for Osteoarthritis.   Bo Merino, MD  Note - This record has been created using Editor, commissioning.  Chart creation errors have been sought, but may not always  have been located. Such creation errors do not reflect on  the standard of medical care.

## 2021-04-08 ENCOUNTER — Other Ambulatory Visit: Payer: Self-pay

## 2021-04-08 ENCOUNTER — Ambulatory Visit (INDEPENDENT_AMBULATORY_CARE_PROVIDER_SITE_OTHER): Payer: Medicare Other

## 2021-04-08 DIAGNOSIS — M810 Age-related osteoporosis without current pathological fracture: Secondary | ICD-10-CM | POA: Diagnosis not present

## 2021-04-08 MED ORDER — DENOSUMAB 60 MG/ML ~~LOC~~ SOSY
60.0000 mg | PREFILLED_SYRINGE | Freq: Once | SUBCUTANEOUS | Status: AC
  Administered 2021-04-08: 60 mg via SUBCUTANEOUS

## 2021-04-08 NOTE — Progress Notes (Signed)
Per orders of Alma Friendly NP, an injection of Prolia was given by Ophelia Shoulder, CMA in the right arm. Patient tolerated injection well.

## 2021-04-14 ENCOUNTER — Encounter: Payer: Self-pay | Admitting: Rheumatology

## 2021-04-14 ENCOUNTER — Ambulatory Visit (INDEPENDENT_AMBULATORY_CARE_PROVIDER_SITE_OTHER): Payer: Medicare Other | Admitting: Rheumatology

## 2021-04-14 ENCOUNTER — Other Ambulatory Visit: Payer: Self-pay

## 2021-04-14 VITALS — BP 122/77 | HR 91 | Resp 12 | Ht 61.0 in | Wt 119.2 lb

## 2021-04-14 DIAGNOSIS — Z800 Family history of malignant neoplasm of digestive organs: Secondary | ICD-10-CM | POA: Diagnosis not present

## 2021-04-14 DIAGNOSIS — Z8639 Personal history of other endocrine, nutritional and metabolic disease: Secondary | ICD-10-CM

## 2021-04-14 DIAGNOSIS — M19011 Primary osteoarthritis, right shoulder: Secondary | ICD-10-CM | POA: Diagnosis not present

## 2021-04-14 DIAGNOSIS — M19042 Primary osteoarthritis, left hand: Secondary | ICD-10-CM

## 2021-04-14 DIAGNOSIS — M810 Age-related osteoporosis without current pathological fracture: Secondary | ICD-10-CM | POA: Diagnosis not present

## 2021-04-14 DIAGNOSIS — Z8041 Family history of malignant neoplasm of ovary: Secondary | ICD-10-CM | POA: Diagnosis not present

## 2021-04-14 DIAGNOSIS — M19041 Primary osteoarthritis, right hand: Secondary | ICD-10-CM

## 2021-04-14 DIAGNOSIS — F32A Depression, unspecified: Secondary | ICD-10-CM

## 2021-04-14 DIAGNOSIS — F5104 Psychophysiologic insomnia: Secondary | ICD-10-CM

## 2021-04-14 DIAGNOSIS — M19012 Primary osteoarthritis, left shoulder: Secondary | ICD-10-CM

## 2021-04-14 DIAGNOSIS — Z803 Family history of malignant neoplasm of breast: Secondary | ICD-10-CM

## 2021-04-14 DIAGNOSIS — M5030 Other cervical disc degeneration, unspecified cervical region: Secondary | ICD-10-CM

## 2021-04-14 DIAGNOSIS — M170 Bilateral primary osteoarthritis of knee: Secondary | ICD-10-CM

## 2021-04-14 DIAGNOSIS — F419 Anxiety disorder, unspecified: Secondary | ICD-10-CM

## 2021-04-14 DIAGNOSIS — M5136 Other intervertebral disc degeneration, lumbar region: Secondary | ICD-10-CM | POA: Diagnosis not present

## 2021-04-14 DIAGNOSIS — M797 Fibromyalgia: Secondary | ICD-10-CM

## 2021-04-14 MED ORDER — LIDOCAINE HCL 1 % IJ SOLN
1.5000 mL | INTRAMUSCULAR | Status: AC | PRN
  Administered 2021-04-14: 1.5 mL

## 2021-04-14 MED ORDER — TRIAMCINOLONE ACETONIDE 40 MG/ML IJ SUSP
40.0000 mg | INTRAMUSCULAR | Status: AC | PRN
  Administered 2021-04-14: 40 mg via INTRA_ARTICULAR

## 2021-04-14 NOTE — Patient Instructions (Signed)
Shoulder Exercises Ask your health care provider which exercises are safe for you. Do exercises exactly as told by your health care provider and adjust them as directed. It is normal to feel mild stretching, pulling, tightness, or discomfort as you do these exercises. Stop right away if you feel sudden pain or your pain gets worse. Do not begin these exercises until told by your health care provider. Stretching exercises External rotation and abduction This exercise is sometimes called corner stretch. This exercise rotates your arm outward (external rotation) and moves your arm out from your body (abduction). Stand in a doorway with one of your feet slightly in front of the other. This is called a staggered stance. If you cannot reach your forearms to the door frame, stand facing a corner of a room. Choose one of the following positions as told by your health care provider: Place your hands and forearms on the door frame above your head. Place your hands and forearms on the door frame at the height of your head. Place your hands on the door frame at the height of your elbows. Slowly move your weight onto your front foot until you feel a stretch across your chest and in the front of your shoulders. Keep your head and chest upright and keep your abdominal muscles tight. Hold for __________ seconds. To release the stretch, shift your weight to your back foot. Repeat __________ times. Complete this exercise __________ times a day. Extension, standing Stand and hold a broomstick, a cane, or a similar object behind your back. Your hands should be a little wider than shoulder width apart. Your palms should face away from your back. Keeping your elbows straight and your shoulder muscles relaxed, move the stick away from your body until you feel a stretch in your shoulders (extension). Avoid shrugging your shoulders while you move the stick. Keep your shoulder blades tucked down toward the middle of your  back. Hold for __________ seconds. Slowly return to the starting position. Repeat __________ times. Complete this exercise __________ times a day. Range-of-motion exercises Pendulum  Stand near a wall or a surface that you can hold onto for balance. Bend at the waist and let your left / right arm hang straight down. Use your other arm to support you. Keep your back straight and do not lock your knees. Relax your left / right arm and shoulder muscles, and move your hips and your trunk so your left / right arm swings freely. Your arm should swing because of the motion of your body, not because you are using your arm or shoulder muscles. Keep moving your hips and trunk so your arm swings in the following directions, as told by your health care provider: Side to side. Forward and backward. In clockwise and counterclockwise circles. Continue each motion for __________ seconds, or for as long as told by your health care provider. Slowly return to the starting position. Repeat __________ times. Complete this exercise __________ times a day. Shoulder flexion, standing  Stand and hold a broomstick, a cane, or a similar object. Place your hands a little more than shoulder width apart on the object. Your left / right hand should be palm up, and your other hand should be palm down. Keep your elbow straight and your shoulder muscles relaxed. Push the stick up with your healthy arm to raise your left / right arm in front of your body, and then over your head until you feel a stretch in your shoulder (flexion). Avoid   shrugging your shoulder while you raise your arm. Keep your shoulder blade tucked down toward the middle of your back. Hold for __________ seconds. Slowly return to the starting position. Repeat __________ times. Complete this exercise __________ times a day. Shoulder abduction, standing Stand and hold a broomstick, a cane, or a similar object. Place your hands a little more than shoulder  width apart on the object. Your left / right hand should be palm up, and your other hand should be palm down. Keep your elbow straight and your shoulder muscles relaxed. Push the object across your body toward your left / right side. Raise your left / right arm to the side of your body (abduction) until you feel a stretch in your shoulder. Do not raise your arm above shoulder height unless your health care provider tells you to do that. If directed, raise your arm over your head. Avoid shrugging your shoulder while you raise your arm. Keep your shoulder blade tucked down toward the middle of your back. Hold for __________ seconds. Slowly return to the starting position. Repeat __________ times. Complete this exercise __________ times a day. Internal rotation  Place your left / right hand behind your back, palm up. Use your other hand to dangle an exercise band, a towel, or a similar object over your shoulder. Grasp the band with your left / right hand so you are holding on to both ends. Gently pull up on the band until you feel a stretch in the front of your left / right shoulder. The movement of your arm toward the center of your body is called internal rotation. Avoid shrugging your shoulder while you raise your arm. Keep your shoulder blade tucked down toward the middle of your back. Hold for __________ seconds. Release the stretch by letting go of the band and lowering your hands. Repeat __________ times. Complete this exercise __________ times a day. Strengthening exercises External rotation  Sit in a stable chair without armrests. Secure an exercise band to a stable object at elbow height on your left / right side. Place a soft object, such as a folded towel or a small pillow, between your left / right upper arm and your body to move your elbow about 4 inches (10 cm) away from your side. Hold the end of the exercise band so it is tight and there is no slack. Keeping your elbow pressed  against the soft object, slowly move your forearm out, away from your abdomen (external rotation). Keep your body steady so only your forearm moves. Hold for __________ seconds. Slowly return to the starting position. Repeat __________ times. Complete this exercise __________ times a day. Shoulder abduction  Sit in a stable chair without armrests, or stand up. Hold a __________ weight in your left / right hand, or hold an exercise band with both hands. Start with your arms straight down and your left / right palm facing in, toward your body. Slowly lift your left / right hand out to your side (abduction). Do not lift your hand above shoulder height unless your health care provider tells you that this is safe. Keep your arms straight. Avoid shrugging your shoulder while you do this movement. Keep your shoulder blade tucked down toward the middle of your back. Hold for __________ seconds. Slowly lower your arm, and return to the starting position. Repeat __________ times. Complete this exercise __________ times a day. Shoulder extension Sit in a stable chair without armrests, or stand up. Secure an exercise band   to a stable object in front of you so it is at shoulder height. Hold one end of the exercise band in each hand. Your palms should face each other. Straighten your elbows and lift your hands up to shoulder height. Step back, away from the secured end of the exercise band, until the band is tight and there is no slack. Squeeze your shoulder blades together as you pull your hands down to the sides of your thighs (extension). Stop when your hands are straight down by your sides. Do not let your hands go behind your body. Hold for __________ seconds. Slowly return to the starting position. Repeat __________ times. Complete this exercise __________ times a day. Shoulder row Sit in a stable chair without armrests, or stand up. Secure an exercise band to a stable object in front of you so it  is at waist height. Hold one end of the exercise band in each hand. Position your palms so that your thumbs are facing the ceiling (neutral position). Bend each of your elbows to a 90-degree angle (right angle) and keep your upper arms at your sides. Step back until the band is tight and there is no slack. Slowly pull your elbows back behind you. Hold for __________ seconds. Slowly return to the starting position. Repeat __________ times. Complete this exercise __________ times a day. Shoulder press-ups  Sit in a stable chair that has armrests. Sit upright, with your feet flat on the floor. Put your hands on the armrests so your elbows are bent and your fingers are pointing forward. Your hands should be about even with the sides of your body. Push down on the armrests and use your arms to lift yourself off the chair. Straighten your elbows and lift yourself up as much as you comfortably can. Move your shoulder blades down, and avoid letting your shoulders move up toward your ears. Keep your feet on the ground. As you get stronger, your feet should support less of your body weight as you lift yourself up. Hold for __________ seconds. Slowly lower yourself back into the chair. Repeat __________ times. Complete this exercise __________ times a day. Wall push-ups  Stand so you are facing a stable wall. Your feet should be about one arm-length away from the wall. Lean forward and place your palms on the wall at shoulder height. Keep your feet flat on the floor as you bend your elbows and lean forward toward the wall. Hold for __________ seconds. Straighten your elbows to push yourself back to the starting position. Repeat __________ times. Complete this exercise __________ times a day. This information is not intended to replace advice given to you by your health care provider. Make sure you discuss any questions you have with your healthcare provider. Document Revised: 06/16/2018 Document  Reviewed: 03/24/2018 Elsevier Patient Education  2022 [employer]  

## 2021-04-14 NOTE — Addendum Note (Signed)
Addended by: Earnestine Mealing on: 04/14/2021 02:52 PM   Modules accepted: Orders

## 2021-04-19 ENCOUNTER — Other Ambulatory Visit: Payer: Self-pay | Admitting: Primary Care

## 2021-04-19 DIAGNOSIS — F419 Anxiety disorder, unspecified: Secondary | ICD-10-CM

## 2021-04-19 DIAGNOSIS — F32A Depression, unspecified: Secondary | ICD-10-CM

## 2021-04-22 ENCOUNTER — Other Ambulatory Visit: Payer: Self-pay | Admitting: *Deleted

## 2021-04-22 DIAGNOSIS — D72819 Decreased white blood cell count, unspecified: Secondary | ICD-10-CM

## 2021-05-01 ENCOUNTER — Encounter: Payer: Self-pay | Admitting: Oncology

## 2021-05-01 ENCOUNTER — Inpatient Hospital Stay: Payer: Medicare Other | Attending: Oncology

## 2021-05-01 ENCOUNTER — Inpatient Hospital Stay (HOSPITAL_BASED_OUTPATIENT_CLINIC_OR_DEPARTMENT_OTHER): Payer: Medicare Other | Admitting: Oncology

## 2021-05-01 ENCOUNTER — Other Ambulatory Visit: Payer: Self-pay

## 2021-05-01 VITALS — BP 133/65 | HR 76 | Temp 96.5°F | Resp 20 | Wt 119.2 lb

## 2021-05-01 DIAGNOSIS — Z8249 Family history of ischemic heart disease and other diseases of the circulatory system: Secondary | ICD-10-CM | POA: Diagnosis not present

## 2021-05-01 DIAGNOSIS — Z90722 Acquired absence of ovaries, bilateral: Secondary | ICD-10-CM | POA: Insufficient documentation

## 2021-05-01 DIAGNOSIS — D72819 Decreased white blood cell count, unspecified: Secondary | ICD-10-CM

## 2021-05-01 DIAGNOSIS — Z79899 Other long term (current) drug therapy: Secondary | ICD-10-CM | POA: Diagnosis not present

## 2021-05-01 DIAGNOSIS — Z801 Family history of malignant neoplasm of trachea, bronchus and lung: Secondary | ICD-10-CM | POA: Insufficient documentation

## 2021-05-01 DIAGNOSIS — Z8371 Family history of colonic polyps: Secondary | ICD-10-CM | POA: Diagnosis not present

## 2021-05-01 DIAGNOSIS — Z8349 Family history of other endocrine, nutritional and metabolic diseases: Secondary | ICD-10-CM | POA: Diagnosis not present

## 2021-05-01 DIAGNOSIS — F32A Depression, unspecified: Secondary | ICD-10-CM | POA: Diagnosis not present

## 2021-05-01 DIAGNOSIS — M25512 Pain in left shoulder: Secondary | ICD-10-CM | POA: Insufficient documentation

## 2021-05-01 DIAGNOSIS — Z823 Family history of stroke: Secondary | ICD-10-CM | POA: Insufficient documentation

## 2021-05-01 DIAGNOSIS — M25511 Pain in right shoulder: Secondary | ICD-10-CM | POA: Insufficient documentation

## 2021-05-01 DIAGNOSIS — Z803 Family history of malignant neoplasm of breast: Secondary | ICD-10-CM | POA: Insufficient documentation

## 2021-05-01 DIAGNOSIS — M797 Fibromyalgia: Secondary | ICD-10-CM | POA: Diagnosis not present

## 2021-05-01 DIAGNOSIS — R5383 Other fatigue: Secondary | ICD-10-CM | POA: Insufficient documentation

## 2021-05-01 DIAGNOSIS — Z885 Allergy status to narcotic agent status: Secondary | ICD-10-CM | POA: Insufficient documentation

## 2021-05-01 DIAGNOSIS — Z800 Family history of malignant neoplasm of digestive organs: Secondary | ICD-10-CM | POA: Insufficient documentation

## 2021-05-01 DIAGNOSIS — Z9079 Acquired absence of other genital organ(s): Secondary | ICD-10-CM | POA: Insufficient documentation

## 2021-05-01 DIAGNOSIS — Z818 Family history of other mental and behavioral disorders: Secondary | ICD-10-CM | POA: Insufficient documentation

## 2021-05-01 DIAGNOSIS — Z888 Allergy status to other drugs, medicaments and biological substances status: Secondary | ICD-10-CM | POA: Diagnosis not present

## 2021-05-01 DIAGNOSIS — Z8041 Family history of malignant neoplasm of ovary: Secondary | ICD-10-CM | POA: Insufficient documentation

## 2021-05-01 LAB — CBC WITH DIFFERENTIAL/PLATELET
Abs Immature Granulocytes: 0.06 10*3/uL (ref 0.00–0.07)
Basophils Absolute: 0 10*3/uL (ref 0.0–0.1)
Basophils Relative: 0 %
Eosinophils Absolute: 0 10*3/uL (ref 0.0–0.5)
Eosinophils Relative: 0 %
HCT: 41.1 % (ref 36.0–46.0)
Hemoglobin: 13.5 g/dL (ref 12.0–15.0)
Immature Granulocytes: 1 %
Lymphocytes Relative: 32 %
Lymphs Abs: 1.7 10*3/uL (ref 0.7–4.0)
MCH: 32.1 pg (ref 26.0–34.0)
MCHC: 32.8 g/dL (ref 30.0–36.0)
MCV: 97.6 fL (ref 80.0–100.0)
Monocytes Absolute: 0.4 10*3/uL (ref 0.1–1.0)
Monocytes Relative: 7 %
Neutro Abs: 3.2 10*3/uL (ref 1.7–7.7)
Neutrophils Relative %: 60 %
Platelets: 174 10*3/uL (ref 150–400)
RBC: 4.21 MIL/uL (ref 3.87–5.11)
RDW: 13.4 % (ref 11.5–15.5)
WBC: 5.4 10*3/uL (ref 4.0–10.5)
nRBC: 0 % (ref 0.0–0.2)

## 2021-05-02 NOTE — Progress Notes (Signed)
Hematology/Oncology Consult note [facility]  [phone] [phone]  Patient Care Team: [name], NP as PCP - General (Internal Medicine) Cletis Athens, MD (Cardiology) Kristeen Miss, MD (Neurosurgery)   Name of the patient: Tracy Estrada  [id number]  1950/06/24   Date of visit: 05/02/21  Diagnosis-leukopenia/neutropenia transient  Chief complaint/ Reason for visit-routine follow-up of leukopenia  Heme/Onc history: patient is a 71 year old female with a past medical history significant for anxiety depression and fibromyalgia.  She has been referred for leukopenia.  Most recent CBC 01/23/2020 showed white count of 3.2, H&H 13.9/41 with a platelet count of 156.  Reports feeling well overall and other than mild fatigue she denies other complaints.  Denies any lumps or bumps anywhere.  Denies any recurrent infections.  No new changes in her medications except her cholesterol drug.   Results of blood work from 03/17/2020 were as follows:   CBC showed white cell count of 4.7, H&H of 13.6/29.8 with an MCV of 97.1 and a platelet count of 187.  B12 levels were low at 283.  Folate levels were normal.  HIV and hepatitis C testing negative.  Smear review unremarkable and flow cytometry did not show any evidence of immunophenotypic abnormality.      Interval history-patient is doing well overall.  Denies any fatigue or unintentional weight loss.  She has ongoing bilateral shoulder pain and will be undergoing bilateral shoulder surgery soon.  ECOG PS- 1 Pain scale- 0   Review of systems- Review of Systems  Constitutional:  Negative for chills, fever, malaise/fatigue and weight loss.  HENT:  Negative for congestion, ear discharge and nosebleeds.   Eyes:  Negative for blurred vision.  Respiratory:  Negative for cough, hemoptysis, sputum production, shortness of breath and wheezing.   Cardiovascular:  Negative for chest pain, palpitations,  orthopnea and claudication.  Gastrointestinal:  Negative for abdominal pain, blood in stool, constipation, diarrhea, heartburn, melena, nausea and vomiting.  Genitourinary:  Negative for dysuria, flank pain, frequency, hematuria and urgency.  Musculoskeletal:  Positive for joint pain. Negative for back pain and myalgias.  Skin:  Negative for rash.  Neurological:  Negative for dizziness, tingling, focal weakness, seizures, weakness and headaches.  Endo/Heme/Allergies:  Does not bruise/bleed easily.  Psychiatric/Behavioral:  Negative for depression and suicidal ideas. The patient does not have insomnia.       Allergies  Allergen Reactions   Demerol [Meperidine] Nausea And Vomiting   Kenalog [Triamcinolone Acetonide] Other (See Comments)    Cause vertigo/dizziness   Adhesive [Tape] Rash and Other (See Comments)    PAPER TAPE OK     Past Medical History:  Diagnosis Date   Achilles bursitis or tendinitis    Anxiety state, unspecified    Arthritis    Closed fracture of unspecified part of fibula    Depressive disorder, not elsewhere classified    Diverticulosis of colon (without mention of hemorrhage)    Edema    Fibromyalgia    Insomnia, unspecified    Lumbago    Mass of left ovary 06/01/2016   Meniere's disease 1986   MVP (mitral valve prolapse)    Other intra-abdominal and pelvic swelling, mass and lump 06/01/2016   Pain in joint, site unspecified    Peptic ulcer    no testing   Pure hypercholesterolemia    TSH elevation      Past Surgical History:  Procedure Laterality Date   ABDOMINAL HYSTERECTOMY  03/08/1980   fibroids; ovaries intact.  BACK SURGERY     x2 Cervical and lumbar   CYSTOSCOPY N/A 06/08/2016   Procedure: CYSTOSCOPY;  Surgeon: Will Bonnet, MD;  Location: [facility] ORS;  Service: Gynecology;  Laterality: N/A;   LAPAROSCOPIC LYSIS OF ADHESIONS  06/08/2016   Procedure: LAPAROSCOPIC LYSIS OF ADHESIONS;  Surgeon: Will Bonnet, MD;  Location: [facility] ORS;   Service: Gynecology;;   LAPAROSCOPIC SALPINGO OOPHERECTOMY Left 06/08/2016   Procedure: LAPAROSCOPIC SALPINGO OOPHORECTOMY;  Surgeon: Will Bonnet, MD;  Location: [facility] ORS;  Service: Gynecology;  Laterality: Left;   OOPHORECTOMY     left ovary   SALPINGECTOMY     left   SPINE SURGERY     lumbar spine 11/2015; cervical spine 2009. [facility].    Social History   Socioeconomic History   Marital status: Married    Spouse name: Naquita Nappier   Number of children: 2   Years of education: Not on file   Highest education level: Not on file  Occupational History   Occupation: retired    Comment: Network engineer  Tobacco Use   Smoking status: Never   Smokeless tobacco: Never  Vaping Use   Vaping Use: Never used  Substance and Sexual Activity   Alcohol use: Yes    Alcohol/week: 0.0 standard drinks    Comment: 3-4 beers per week or wine   Drug use: No   Sexual activity: Yes    Birth control/protection: Surgical  Other Topics Concern   Not on file  Social History Narrative   Marital status:  Married x 14 years; second marriage;happily married,husband has PTSD.     Children: 2 daughters [name], [name]); 7 grandchildren; 2 gg      Lives: with husband, dog/beagle      Employment: retired since 2013; Network engineer      Tobacco: never      Alcohol: weekends; 1-2 glasses of wine per week or beer.      Drugs:  None      Exercise: none in 2018      Seatbelt: 100%      Guns: unloaded.     Caffeine not every day. 7 grandchildren and 2 GG. Organ donor NO.      ADLs: independent with ADLs; drives      Advanced Directives: YES; FULL CODE no prolonged measures.   Social Determinants of Health   Financial Resource Strain: Not on file  Food Insecurity: Not on file  Transportation Needs: Not on file  Physical Activity: Not on file  Stress: Not on file  Social Connections: Not on file  Intimate Partner Violence: Not on file    Family History  Problem Relation Age of Onset   Hyperlipidemia Mother     Hypertension Mother    Colon polyps Mother    Coronary artery disease Father         AMI age 9   Heart disease Father 83       AMI x 2; first AMI in 71s   Breast cancer Sister 4       with metastasis to bone; smoker   Cancer Sister 10       breast cancer   Colon cancer Maternal Uncle        dx. >50   Colon cancer Paternal Aunt        dx. 27s   Heart attack Paternal Uncle    Stroke Maternal Grandmother    Alzheimer's disease Paternal Grandmother    Heart disease Paternal Grandfather    Heart  attack Paternal Grandfather    Healthy Daughter    Healthy Daughter    Breast cancer Cousin    Ovarian cancer Cousin        dx. late 70s/dx. 60s   Lung cancer Cousin        dx. 70s   Stomach cancer Other      Current Outpatient Medications:    acetaminophen (TYLENOL) 325 MG tablet, Take 650 mg by mouth every 6 (six) hours as needed., Disp: , Rfl:    buPROPion (WELLBUTRIN XL) 150 MG 24 hr tablet, Take 2 tablets (300 mg total) by mouth daily. For depression., Disp: 180 tablet, Rfl: 3   busPIRone (BUSPAR) 5 MG tablet, TAKE 1 TABLET (5 MG TOTAL) BY MOUTH 2 (TWO) TIMES DAILY. FOR ANXIETY, Disp: 180 tablet, Rfl: 1   Capsaicin-Menthol (SALONPAS GEL-PATCH HOT EX), Apply topically., Disp: , Rfl:    Cholecalciferol (VITAMIN D3 PO), Take by mouth., Disp: , Rfl:    ELDERBERRY PO, Take by mouth., Disp: , Rfl:    fluticasone (FLONASE) 50 MCG/ACT nasal spray, Place 2 sprays into the nose daily as needed for allergies. , Disp: , Rfl:    Misc Natural Products (NEURIVA PO), Take by mouth., Disp: , Rfl:    Multiple Vitamins-Minerals (MULTIVITAMIN WITH MINERALS) tablet, Take 1 tablet by mouth daily. Centrum Silver for Women, Disp: , Rfl:    Omega-3 Fatty Acids (FISH OIL) 500 MG CAPS, Take 500 mg by mouth daily., Disp: , Rfl:    POTASSIUM PO, Take by mouth., Disp: , Rfl:    pravastatin (PRAVACHOL) 40 MG tablet, TAKE 1 TABLET BY MOUTH EVERY DAY FOR CHOLESTEROL, Disp: 90 tablet, Rfl: 3   vitamin B-12  (CYANOCOBALAMIN) 1000 MCG tablet, Take 1,000 mcg by mouth daily., Disp: , Rfl:   Physical exam:  Vitals:   05/01/21 1348  BP: 133/65  Pulse: 76  Resp: 20  Temp: (!) 96.5 F (35.8 C)  SpO2: 100%  Weight: 119 lb 3.2 oz (54.1 kg)   Physical Exam Constitutional:      General: She is not in acute distress. Cardiovascular:     Rate and Rhythm: Normal rate and regular rhythm.     Heart sounds: Normal heart sounds.  Pulmonary:     Effort: Pulmonary effort is normal.     Breath sounds: Normal breath sounds.  Abdominal:     General: Bowel sounds are normal.     Palpations: Abdomen is soft.  Skin:    General: Skin is warm and dry.  Neurological:     Mental Status: She is alert and oriented to person, place, and time.     CMP Latest Ref Rng & Units 03/24/2021  Glucose 70 - 99 mg/dL 80  BUN 6 - 23 mg/dL 16  Creatinine 0.40 - 1.20 mg/dL 0.65  Sodium 135 - 145 mEq/L 139  Potassium 3.5 - 5.1 mEq/L 3.7  Chloride 96 - 112 mEq/L 103  CO2 19 - 32 mEq/L 30  Calcium 8.4 - 10.5 mg/dL 9.9  Total Protein 6.0 - 8.3 g/dL 7.4  Total Bilirubin 0.2 - 1.2 mg/dL 0.4  Alkaline Phos 39 - 117 U/L 46  AST 0 - 37 U/L 17  ALT 0 - 35 U/L 15   CBC Latest Ref Rng & Units 05/01/2021  WBC 4.0 - 10.5 K/uL 5.4  Hemoglobin 12.0 - 15.0 g/dL 13.5  Hematocrit 36.0 - 46.0 % 41.1  Platelets 150 - 400 K/uL 174     Assessment and plan- Patient is  a 71 y.o. female referred for leukopenia  Patient had transient leukopenia when her white count was 3.9 in November 2020 and then 3.2 in November 2021.  On repeat check in January 2022 her white count was 4.7 with a normal differential and remains normal today as well.  Hemoglobin and platelets are also normal.  She therefore does not require any follow-up with hematology at this time   Visit Diagnosis 1. Leukopenia, unspecified type      Dr. Randa Evens, MD, MPH [facility] [phone] 05/02/2021 12:58 PM

## 2021-05-17 ENCOUNTER — Other Ambulatory Visit: Payer: Self-pay | Admitting: Primary Care

## 2021-05-17 DIAGNOSIS — E785 Hyperlipidemia, unspecified: Secondary | ICD-10-CM

## 2021-05-21 DIAGNOSIS — M19012 Primary osteoarthritis, left shoulder: Secondary | ICD-10-CM | POA: Diagnosis not present

## 2021-05-21 DIAGNOSIS — M19011 Primary osteoarthritis, right shoulder: Secondary | ICD-10-CM | POA: Diagnosis not present

## 2021-06-09 ENCOUNTER — Ambulatory Visit: Payer: Medicare Other | Admitting: Primary Care

## 2021-06-15 DIAGNOSIS — R1032 Left lower quadrant pain: Secondary | ICD-10-CM | POA: Diagnosis not present

## 2021-06-15 DIAGNOSIS — N3000 Acute cystitis without hematuria: Secondary | ICD-10-CM | POA: Diagnosis not present

## 2021-06-16 ENCOUNTER — Ambulatory Visit: Payer: Medicare Other | Admitting: Primary Care

## 2021-06-18 DIAGNOSIS — H43813 Vitreous degeneration, bilateral: Secondary | ICD-10-CM | POA: Diagnosis not present

## 2021-06-20 ENCOUNTER — Emergency Department (HOSPITAL_COMMUNITY): Payer: Medicare Other

## 2021-06-20 ENCOUNTER — Other Ambulatory Visit: Payer: Self-pay

## 2021-06-20 ENCOUNTER — Inpatient Hospital Stay (HOSPITAL_COMMUNITY)
Admission: EM | Admit: 2021-06-20 | Discharge: 2021-06-27 | DRG: 391 | Disposition: A | Discharge status: Home / Self Care | Payer: Medicare Other | Attending: Internal Medicine | Admitting: Internal Medicine

## 2021-06-20 ENCOUNTER — Encounter (HOSPITAL_COMMUNITY): Payer: Self-pay | Admitting: Emergency Medicine

## 2021-06-20 DIAGNOSIS — E7800 Pure hypercholesterolemia, unspecified: Secondary | ICD-10-CM | POA: Diagnosis not present

## 2021-06-20 DIAGNOSIS — M25511 Pain in right shoulder: Secondary | ICD-10-CM | POA: Diagnosis not present

## 2021-06-20 DIAGNOSIS — B957 Other staphylococcus as the cause of diseases classified elsewhere: Secondary | ICD-10-CM | POA: Diagnosis present

## 2021-06-20 DIAGNOSIS — B965 Pseudomonas (aeruginosa) (mallei) (pseudomallei) as the cause of diseases classified elsewhere: Secondary | ICD-10-CM | POA: Diagnosis not present

## 2021-06-20 DIAGNOSIS — K5732 Diverticulitis of large intestine without perforation or abscess without bleeding: Secondary | ICD-10-CM | POA: Diagnosis present

## 2021-06-20 DIAGNOSIS — D72819 Decreased white blood cell count, unspecified: Secondary | ICD-10-CM | POA: Diagnosis present

## 2021-06-20 DIAGNOSIS — F411 Generalized anxiety disorder: Secondary | ICD-10-CM | POA: Diagnosis present

## 2021-06-20 DIAGNOSIS — D649 Anemia, unspecified: Secondary | ICD-10-CM | POA: Diagnosis present

## 2021-06-20 DIAGNOSIS — Z933 Colostomy status: Secondary | ICD-10-CM | POA: Diagnosis present

## 2021-06-20 DIAGNOSIS — Z820 Family history of epilepsy and other diseases of the nervous system: Secondary | ICD-10-CM

## 2021-06-20 DIAGNOSIS — G4700 Insomnia, unspecified: Secondary | ICD-10-CM | POA: Diagnosis present

## 2021-06-20 DIAGNOSIS — A419 Sepsis, unspecified organism: Secondary | ICD-10-CM | POA: Diagnosis not present

## 2021-06-20 DIAGNOSIS — M797 Fibromyalgia: Secondary | ICD-10-CM | POA: Diagnosis present

## 2021-06-20 DIAGNOSIS — Z20822 Contact with and (suspected) exposure to covid-19: Secondary | ICD-10-CM | POA: Diagnosis present

## 2021-06-20 DIAGNOSIS — Z91048 Other nonmedicinal substance allergy status: Secondary | ICD-10-CM

## 2021-06-20 DIAGNOSIS — K651 Peritoneal abscess: Secondary | ICD-10-CM | POA: Diagnosis not present

## 2021-06-20 DIAGNOSIS — Z83438 Family history of other disorder of lipoprotein metabolism and other lipidemia: Secondary | ICD-10-CM

## 2021-06-20 DIAGNOSIS — D72829 Elevated white blood cell count, unspecified: Secondary | ICD-10-CM

## 2021-06-20 DIAGNOSIS — Z888 Allergy status to other drugs, medicaments and biological substances status: Secondary | ICD-10-CM

## 2021-06-20 DIAGNOSIS — Z8616 Personal history of COVID-19: Secondary | ICD-10-CM | POA: Diagnosis not present

## 2021-06-20 DIAGNOSIS — R198 Other specified symptoms and signs involving the digestive system and abdomen: Secondary | ICD-10-CM | POA: Diagnosis present

## 2021-06-20 DIAGNOSIS — F32A Depression, unspecified: Secondary | ICD-10-CM | POA: Diagnosis not present

## 2021-06-20 DIAGNOSIS — Z8249 Family history of ischemic heart disease and other diseases of the circulatory system: Secondary | ICD-10-CM | POA: Diagnosis not present

## 2021-06-20 DIAGNOSIS — K3189 Other diseases of stomach and duodenum: Secondary | ICD-10-CM | POA: Diagnosis not present

## 2021-06-20 DIAGNOSIS — I3139 Other pericardial effusion (noninflammatory): Secondary | ICD-10-CM | POA: Diagnosis not present

## 2021-06-20 DIAGNOSIS — E876 Hypokalemia: Secondary | ICD-10-CM | POA: Diagnosis present

## 2021-06-20 DIAGNOSIS — Z79899 Other long term (current) drug therapy: Secondary | ICD-10-CM

## 2021-06-20 DIAGNOSIS — Z823 Family history of stroke: Secondary | ICD-10-CM

## 2021-06-20 DIAGNOSIS — R188 Other ascites: Secondary | ICD-10-CM | POA: Diagnosis not present

## 2021-06-20 DIAGNOSIS — K5720 Diverticulitis of large intestine with perforation and abscess without bleeding: Secondary | ICD-10-CM | POA: Diagnosis not present

## 2021-06-20 DIAGNOSIS — G8929 Other chronic pain: Secondary | ICD-10-CM | POA: Diagnosis present

## 2021-06-20 DIAGNOSIS — K5730 Diverticulosis of large intestine without perforation or abscess without bleeding: Secondary | ICD-10-CM | POA: Diagnosis not present

## 2021-06-20 DIAGNOSIS — L02211 Cutaneous abscess of abdominal wall: Secondary | ICD-10-CM | POA: Diagnosis not present

## 2021-06-20 DIAGNOSIS — F419 Anxiety disorder, unspecified: Secondary | ICD-10-CM | POA: Diagnosis not present

## 2021-06-20 DIAGNOSIS — K5792 Diverticulitis of intestine, part unspecified, without perforation or abscess without bleeding: Secondary | ICD-10-CM

## 2021-06-20 DIAGNOSIS — K5780 Diverticulitis of intestine, part unspecified, with perforation and abscess without bleeding: Secondary | ICD-10-CM | POA: Diagnosis not present

## 2021-06-20 LAB — TSH: TSH: 2.152 u[IU]/mL (ref 0.350–4.500)

## 2021-06-20 LAB — CBC WITH DIFFERENTIAL/PLATELET
Abs Immature Granulocytes: 0.08 10*3/uL — ABNORMAL HIGH (ref 0.00–0.07)
Basophils Absolute: 0 10*3/uL (ref 0.0–0.1)
Basophils Relative: 0 %
Eosinophils Absolute: 0 10*3/uL (ref 0.0–0.5)
Eosinophils Relative: 0 %
HCT: 33.7 % — ABNORMAL LOW (ref 36.0–46.0)
Hemoglobin: 11 g/dL — ABNORMAL LOW (ref 12.0–15.0)
Immature Granulocytes: 1 %
Lymphocytes Relative: 9 %
Lymphs Abs: 1.1 10*3/uL (ref 0.7–4.0)
MCH: 32.3 pg (ref 26.0–34.0)
MCHC: 32.6 g/dL (ref 30.0–36.0)
MCV: 98.8 fL (ref 80.0–100.0)
Monocytes Absolute: 1 10*3/uL (ref 0.1–1.0)
Monocytes Relative: 9 %
Neutro Abs: 9.7 10*3/uL — ABNORMAL HIGH (ref 1.7–7.7)
Neutrophils Relative %: 81 %
Platelets: 252 10*3/uL (ref 150–400)
RBC: 3.41 MIL/uL — ABNORMAL LOW (ref 3.87–5.11)
RDW: 12.4 % (ref 11.5–15.5)
WBC: 11.9 10*3/uL — ABNORMAL HIGH (ref 4.0–10.5)
nRBC: 0 % (ref 0.0–0.2)

## 2021-06-20 LAB — URINALYSIS, ROUTINE W REFLEX MICROSCOPIC
Bacteria, UA: NONE SEEN
Bilirubin Urine: NEGATIVE
Glucose, UA: NEGATIVE mg/dL
Hgb urine dipstick: NEGATIVE
Ketones, ur: 20 mg/dL — AB
Leukocytes,Ua: NEGATIVE
Nitrite: NEGATIVE
Protein, ur: 30 mg/dL — AB
Specific Gravity, Urine: 1.024 (ref 1.005–1.030)
pH: 5 (ref 5.0–8.0)

## 2021-06-20 LAB — CBC
HCT: 37.9 % (ref 36.0–46.0)
Hemoglobin: 12.6 g/dL (ref 12.0–15.0)
MCH: 32.6 pg (ref 26.0–34.0)
MCHC: 33.2 g/dL (ref 30.0–36.0)
MCV: 98.2 fL (ref 80.0–100.0)
Platelets: 335 10*3/uL (ref 150–400)
RBC: 3.86 MIL/uL — ABNORMAL LOW (ref 3.87–5.11)
RDW: 12.6 % (ref 11.5–15.5)
WBC: 17.2 10*3/uL — ABNORMAL HIGH (ref 4.0–10.5)
nRBC: 0 % (ref 0.0–0.2)

## 2021-06-20 LAB — BRAIN NATRIURETIC PEPTIDE: B Natriuretic Peptide: 58.7 pg/mL (ref 0.0–100.0)

## 2021-06-20 LAB — COMPREHENSIVE METABOLIC PANEL WITH GFR
ALT: 11 U/L (ref 0–44)
AST: 15 U/L (ref 15–41)
Albumin: 3.3 g/dL — ABNORMAL LOW (ref 3.5–5.0)
Alkaline Phosphatase: 50 U/L (ref 38–126)
Anion gap: 8 (ref 5–15)
BUN: 13 mg/dL (ref 8–23)
CO2: 28 mmol/L (ref 22–32)
Calcium: 9.1 mg/dL (ref 8.9–10.3)
Chloride: 99 mmol/L (ref 98–111)
Creatinine, Ser: 0.67 mg/dL (ref 0.44–1.00)
GFR, Estimated: >60 mL/min
Glucose, Bld: 141 mg/dL — ABNORMAL HIGH (ref 70–99)
Potassium: 3.7 mmol/L (ref 3.5–5.1)
Sodium: 135 mmol/L (ref 135–145)
Total Bilirubin: 0.8 mg/dL (ref 0.3–1.2)
Total Protein: 7.3 g/dL (ref 6.5–8.1)

## 2021-06-20 LAB — LACTIC ACID, PLASMA
Lactic Acid, Venous: 1.7 mmol/L (ref 0.5–1.9)
Lactic Acid, Venous: 1.6 mmol/L (ref 0.5–1.9)
Lactic Acid, Venous: 1 mmol/L (ref 0.5–1.9)

## 2021-06-20 LAB — RESP PANEL BY RT-PCR (FLU A&B, COVID) ARPGX2
Influenza A by PCR: NEGATIVE
Influenza B by PCR: NEGATIVE
SARS Coronavirus 2 by RT PCR: NEGATIVE

## 2021-06-20 LAB — PROTIME-INR
INR: 1.3 — ABNORMAL HIGH (ref 0.8–1.2)
Prothrombin Time: 15.6 seconds — ABNORMAL HIGH (ref 11.4–15.2)

## 2021-06-20 LAB — LIPASE, BLOOD: Lipase: 25 U/L (ref 11–51)

## 2021-06-20 LAB — TROPONIN I (HIGH SENSITIVITY): Troponin I (High Sensitivity): 8 ng/L (ref <18)

## 2021-06-20 LAB — PROCALCITONIN: Procalcitonin: 0.21 ng/mL

## 2021-06-20 MED ORDER — ACETAMINOPHEN 325 MG PO TABS
650.0000 mg | ORAL_TABLET | Freq: Four times a day (QID) | ORAL | Status: DC | PRN
  Administered 2021-06-21 – 2021-06-24 (×3): 650 mg via ORAL
  Filled 2021-06-20 (×5): qty 2

## 2021-06-20 MED ORDER — HYDROCODONE-ACETAMINOPHEN 5-325 MG PO TABS: 1.0000 | ORAL_TABLET | Freq: Four times a day (QID) | ORAL | Status: DC | PRN

## 2021-06-20 MED ORDER — ONDANSETRON HCL 4 MG/2ML IJ SOLN
4.0000 mg | Freq: Four times a day (QID) | INTRAMUSCULAR | Status: DC | PRN
Start: 2021-06-20 — End: 2021-06-27
  Administered 2021-06-22: 4 mg via INTRAVENOUS
  Filled 2021-06-20: qty 2

## 2021-06-20 MED ORDER — PIPERACILLIN-TAZOBACTAM 3.375 G IVPB 30 MIN: 3.3750 g | Freq: Three times a day (TID) | INTRAVENOUS | Status: DC

## 2021-06-20 MED ORDER — ALBUTEROL SULFATE (2.5 MG/3ML) 0.083% IN NEBU: 2.5000 mg | INHALATION_SOLUTION | RESPIRATORY_TRACT | Status: DC | PRN

## 2021-06-20 MED ORDER — METRONIDAZOLE 500 MG/100ML IV SOLN
500.0000 mg | Freq: Two times a day (BID) | INTRAVENOUS | Status: DC
  Administered 2021-06-20 – 2021-06-22 (×4): 500 mg via INTRAVENOUS
  Filled 2021-06-20 (×4): qty 100

## 2021-06-20 MED ORDER — BUPROPION HCL ER (XL) 150 MG PO TB24
300.0000 mg | ORAL_TABLET | Freq: Every day | ORAL | Status: DC
  Administered 2021-06-22 – 2021-06-27 (×6): 300 mg via ORAL
  Filled 2021-06-20: qty 2
  Filled 2021-06-20: qty 1
  Filled 2021-06-20 (×6): qty 2

## 2021-06-20 MED ORDER — CEFEPIME HCL 2 G IJ SOLR
2.0000 g | Freq: Three times a day (TID) | INTRAMUSCULAR | Status: DC
  Administered 2021-06-21 – 2021-06-22 (×4): 2 g via INTRAVENOUS
  Filled 2021-06-20 (×4): qty 12.5

## 2021-06-20 MED ORDER — ONDANSETRON HCL 4 MG PO TABS
4.0000 mg | ORAL_TABLET | Freq: Four times a day (QID) | ORAL | Status: DC | PRN
Start: 2021-06-20 — End: 2021-06-27

## 2021-06-20 MED ORDER — FENTANYL CITRATE PF 50 MCG/ML IJ SOSY
50.0000 ug | PREFILLED_SYRINGE | Freq: Once | INTRAMUSCULAR | Status: AC
  Administered 2021-06-20: 50 ug via INTRAVENOUS
  Filled 2021-06-20: qty 1

## 2021-06-20 MED ORDER — IOHEXOL 300 MG/ML  SOLN
100.0000 mL | Freq: Once | INTRAMUSCULAR | Status: AC | PRN
  Administered 2021-06-20: 100 mL via INTRAVENOUS

## 2021-06-20 MED ORDER — SODIUM CHLORIDE 0.9 % IV SOLN
2.0000 g | Freq: Once | INTRAVENOUS | Status: AC
  Administered 2021-06-20: 2 g via INTRAVENOUS
  Filled 2021-06-20: qty 12.5

## 2021-06-20 MED ORDER — ONDANSETRON HCL 4 MG/2ML IJ SOLN
4.0000 mg | Freq: Once | INTRAMUSCULAR | Status: AC
  Administered 2021-06-20: 4 mg via INTRAVENOUS
  Filled 2021-06-20: qty 2

## 2021-06-20 MED ORDER — ACETAMINOPHEN 500 MG PO TABS
1000.0000 mg | ORAL_TABLET | Freq: Once | ORAL | Status: AC
  Administered 2021-06-20: 1000 mg via ORAL
  Filled 2021-06-20: qty 2

## 2021-06-20 MED ORDER — ACETAMINOPHEN 650 MG RE SUPP: 650.0000 mg | Freq: Four times a day (QID) | RECTAL | Status: DC | PRN

## 2021-06-20 MED ORDER — LACTATED RINGERS IV SOLN: INTRAVENOUS | Status: AC

## 2021-06-20 MED ORDER — BUSPIRONE HCL 5 MG PO TABS
5.0000 mg | ORAL_TABLET | Freq: Two times a day (BID) | ORAL | Status: DC
  Administered 2021-06-20: 5 mg via ORAL
  Filled 2021-06-20: qty 1

## 2021-06-20 MED ORDER — FENTANYL CITRATE PF 50 MCG/ML IJ SOSY
25.0000 ug | PREFILLED_SYRINGE | INTRAMUSCULAR | Status: AC | PRN
  Administered 2021-06-21 (×3): 25 ug via INTRAVENOUS
  Filled 2021-06-20 (×4): qty 1

## 2021-06-20 MED ORDER — LACTATED RINGERS IV BOLUS
500.0000 mL | Freq: Once | INTRAVENOUS | Status: AC
  Administered 2021-06-20: 500 mL via INTRAVENOUS

## 2021-06-20 MED ORDER — LACTATED RINGERS IV BOLUS
1000.0000 mL | Freq: Once | INTRAVENOUS | Status: AC
  Administered 2021-06-20: 1000 mL via INTRAVENOUS

## 2021-06-20 NOTE — H&P (Signed)
?History and Physical  ? ? ?Tracy Estrada PXT:[id number] DOB: 12-Dec-1950 DOA: 06/20/2021 ? ?PCP: Tracy Koch, NP  ?Patient coming from: home ? ?I have personally briefly reviewed patient's old medical records in [facility] ? ?Chief Complaint: left side abdominal pain x 1 mo ? ?HPI: Tracy Estrada is a 71 y.o. female with medical history significant of  HLD,osteoarthritis, degenerative disc disease and fibromyalgia syndrome, depression anxiety, as well as recurrent UTI. Patient also has interim history of visit to UC for urinary symptoms and left sided abdominal pain on 06/15/21 for which she was treated  with ciprofloxacin, urine culture grew Klebsiella. Patient states she took 2-3 days of  7/day course.Patient now returns to ED for persistent left sided abdominal pain despite treatment of UTI. Patient notes no fever, chills,diarrhea blood in stools but has noted  n/v with her symptoms. Patient on further ros has noted around 20 lb weight loss over the 6-8 months.  She also noted  associated persistent fatigue and malaise and since symptoms started after she has COVID she assumed it was the cause.  She noted that her symptoms of abdominal pain has progressed over the last 1 week. ? ?ED Course:  ?IN the ed on evaluation of abdominal pain  CT scan of abdomen performed was notable for sigmoid diverticulitis with intramural  abscess  formation with query of possible underlying malignancy.  At that time  ?case discussed with general surgeryMichaelle Birks MD) who   rec med admit and iv antibiotics at this time.  ? ?Vitals: ?100.9, BP 126/73 hr 127, r 18  sat 95% ? ?Labs ?Wbc: 17, hbg 12.6/ prior 13.5  ?Na: 135, K3,7, cr 0.67 ?Lactic 1.7 ?EKG: sinus rhythm no st twave changes  ?OEV:OJJ ?CE8 ?Tx LR 1.5L , zofran, fentanyl 50 mcg ?UA: neg ?CT abd ?IMPRESSION: ?1. Findings likely represent complicated acute sigmoid ?diverticulitis with a 10 x 6 cm intramural abscess formation along ?the mid sigmoid colon. Query  associated fistula with a small bowel ?loop in the right mid abdomen. Underlying malignancy is not ?excluded. Recommend surgical consultation. ?2. Indeterminate 3.8 cm right adnexal fluid collection. Recommend ?follow-up US in 6-12 months. Note: This recommendation does not ?Review of Systems: As per HPI otherwise 10 point review of systems negative.  ? ?Past Medical History:  ?Diagnosis Date  ? Achilles bursitis or tendinitis   ? Anxiety state, unspecified   ? Arthritis   ? Closed fracture of unspecified part of fibula   ? Depressive disorder, not elsewhere classified   ? Diverticulosis of colon (without mention of hemorrhage)   ? Edema   ? Fibromyalgia   ? Insomnia, unspecified   ? Lumbago   ? Mass of left ovary 06/01/2016  ? Meniere's disease 1986  ? MVP (mitral valve prolapse)   ? Other intra-abdominal and pelvic swelling, mass and lump 06/01/2016  ? Pain in joint, site unspecified   ? Peptic ulcer   ? no testing  ? Pure hypercholesterolemia   ? TSH elevation   ? ? ?Past Surgical History:  ?Procedure Laterality Date  ? ABDOMINAL HYSTERECTOMY  03/08/1980  ? fibroids; ovaries intact.  ? BACK SURGERY    ? x2 Cervical and lumbar  ? CYSTOSCOPY N/A 06/08/2016  ? Procedure: CYSTOSCOPY;  Surgeon: Tracy Bonnet, MD;  Location: [facility] ORS;  Service: Gynecology;  Laterality: N/A;  ? LAPAROSCOPIC LYSIS OF ADHESIONS  06/08/2016  ? Procedure: LAPAROSCOPIC LYSIS OF ADHESIONS;  Surgeon: Tracy Bonnet, MD;  [facility];  Service: Gynecology;;  ? LAPAROSCOPIC SALPINGO OOPHERECTOMY Left 06/08/2016  ? Procedure: LAPAROSCOPIC SALPINGO OOPHORECTOMY;  Surgeon: Tracy Bonnet, MD;  Location: [facility] ORS;  Service: Gynecology;  Laterality: Left;  ? OOPHORECTOMY    ? left ovary  ? SALPINGECTOMY    ? left  ? SPINE SURGERY    ? lumbar spine 11/2015; cervical spine 2009. Tracy Estrada.  ? ? ? reports that she has never smoked. She has never used smokeless tobacco. She reports current alcohol use. She reports that she does not use  drugs. ? ?Allergies  ?Allergen Reactions  ? Demerol [Meperidine] Nausea And Vomiting  ? Kenalog [Triamcinolone Acetonide] Other (See Comments)  ?  Cause vertigo/dizziness  ? Adhesive [Tape] Rash and Other (See Comments)  ?  PAPER TAPE OK  ? ? ?Family History  ?Problem Relation Age of Onset  ? Hyperlipidemia Mother   ? Hypertension Mother   ? Colon polyps Mother   ? Coronary artery disease Father   ?      AMI age 59  ? Heart disease Father 61  ?     AMI x 2; first AMI in 66s  ? Breast cancer Sister 21  ?     with metastasis to bone; smoker  ? Cancer Sister 51  ?     breast cancer  ? Colon cancer Maternal Uncle   ?     dx. >50  ? Colon cancer Paternal Aunt   ?     dx. 4s  ? Heart attack Paternal Uncle   ? Stroke Maternal Grandmother   ? Alzheimer's disease Paternal Grandmother   ? Heart disease Paternal Grandfather   ? Heart attack Paternal Grandfather   ? Healthy Daughter   ? Healthy Daughter   ? Breast cancer Cousin   ? Ovarian cancer Cousin   ?     dx. late 70s/dx. 60s  ? Lung cancer Cousin   ?     dx. 12s  ? Stomach cancer Other   ? ?Prior to Admission medications   ?Medication Sig Start Date End Date Taking? Authorizing Provider  ?acetaminophen (TYLENOL) 325 MG tablet Take 650 mg by mouth every 6 (six) hours as needed.    [provider]  ?buPROPion (WELLBUTRIN XL) 150 MG 24 hr tablet Take 2 tablets (300 mg total) by mouth daily. For depression. 03/24/21   Tracy Koch, NP  ?busPIRone (BUSPAR) 5 MG tablet TAKE 1 TABLET (5 MG TOTAL) BY MOUTH 2 (TWO) TIMES DAILY. FOR ANXIETY 04/19/21   Tracy Koch, NP  ?Capsaicin-Menthol (SALONPAS GEL-PATCH HOT EX) Apply topically.    [provider]  ?Cholecalciferol (VITAMIN D3 PO) Take by mouth.    [provider]  ?ELDERBERRY PO Take by mouth.    [provider]  ?fluticasone (FLONASE) 50 MCG/ACT nasal spray Place 2 sprays into the nose daily as needed for allergies.     [provider]  ?Misc Natural Products (NEURIVA  PO) Take by mouth.    [provider]  ?Multiple Vitamins-Minerals (MULTIVITAMIN WITH MINERALS) tablet Take 1 tablet by mouth daily. Centrum Silver for Women    [provider]  ?Omega-3 Fatty Acids (FISH OIL) 500 MG CAPS Take 500 mg by mouth daily.    [provider]  ?POTASSIUM PO Take by mouth.    [provider]  ?pravastatin (PRAVACHOL) 40 MG tablet TAKE 1 TABLET BY MOUTH EVERY DAY FOR CHOLESTEROL 05/17/21   Tracy Koch, NP  ?vitamin B-12 (  CYANOCOBALAMIN) 1000 MCG tablet Take 1,000 mcg by mouth daily.    [provider]  ? ? ?Physical Exam: ?Vitals:  ? 06/20/21 1845 06/20/21 1900 06/20/21 1924 06/20/21 1925  ?BP:  (!) 106/58  (!) 108/54  ?Pulse: 99 93 88 80  ?Resp:   16 16  ?Temp:      ?TempSrc:      ?SpO2: 96% 92%  92%  ? ? ? ?Vitals:  ? 06/20/21 1845 06/20/21 1900 06/20/21 1924 06/20/21 1925  ?BP:  (!) 106/58  (!) 108/54  ?Pulse: 99 93 88 80  ?Resp:   16 16  ?Temp:      ?TempSrc:      ?SpO2: 96% 92%  92%  ?Constitutional: NAD, calm, comfortable ?Eyes: PERRL, lids and conjunctivae normal ?ENMT: Mucous membranes are moist. Posterior pharynx clear of any exudate or lesions.Normal dentition.  ?Neck: normal, supple, no masses, no thyromegaly ?Respiratory: clear to auscultation bilaterally, no wheezing, no crackles. Normal respiratory effort. No accessory muscle use.  ?Cardiovascular: Regular rate and rhythm, no murmurs / rubs / gallops. No extremity edema. 2+ pedal pulses. No carotid bruits.  ?Abdomen: + suprapubic and LLQ tenderness, no masses palpated. No hepatosplenomegaly. Bowel sounds positive. No rebound no guarding. ?Musculoskeletal: no clubbing / cyanosis. No joint deformity upper and lower extremities. Good ROM, no contractures. Normal muscle tone.  ?Skin: no rashes, lesions, ulcers. No induration ?Neurologic: CN 2-12 grossly intact. Sensation intact,normal. Strength 5/5 in all 4.  ?Psychiatric: Normal judgment and insight. Alert and oriented x 3. Normal  mood.  ? ? ?Labs on Admission: I have personally reviewed following labs and imaging studies ? ?CBC: ?Recent Labs  ?Lab 06/20/21 ?1417  ?WBC 17.2*  ?HGB 12.6  ?HCT 37.9  ?MCV 98.2  ?PLT 335  ? ?Basic Metabolic Panel: ?Rece

## 2021-06-20 NOTE — Consult Note (Signed)
? ?Tracy Estrada ?03-27-1950  ?[id number].   ? ?Requesting MD: Dr. Marty Heck ?Chief Complaint/Reason for Consult: colon perforation ? ?HPI:  ?[name] is a 71 yo female who presented to the ED today with worsening abdominal pain. She started having lower abdominal discomfort and pain several weeks ago, which has slowly been getting worse. She also reports decreased appetite and a 20-lb unintentional weight loss over the last 6 months, which started after she had COVID in September. She endorses constipation but denies blood in her stools. She says that a few days ago she was started on treatment for a UTI. She has not noticed any blood or air in her urine. A CT scan showed sigmoid colon inflammation with an adjacent large gas and fluid collection, consistent with perforated diverticulitis vs a colon mass. ? ?Her last colonoscopy was 5 years ago, which showed diverticulosis and internal hemorrhoids but was otherwise normal. Her uncle had colon cancer. ? ? ? ?ROS: ?Review of Systems  ?Constitutional:  Negative for chills and fever.  ?Respiratory:  Negative for shortness of breath and wheezing.   ?Cardiovascular:  Negative for chest pain.  ?Gastrointestinal:  Positive for abdominal pain and constipation. Negative for blood in stool, nausea and vomiting.  ?Genitourinary:  Negative for dysuria and hematuria.  ? ?Family History  ?Problem Relation Age of Onset  ? Hyperlipidemia Mother   ? Hypertension Mother   ? Colon polyps Mother   ? Coronary artery disease Father   ?      AMI age 2  ? Heart disease Father 60  ?     AMI x 2; first AMI in 74s  ? Breast cancer Sister 54  ?     with metastasis to bone; smoker  ? Cancer Sister 22  ?     breast cancer  ? Colon cancer Maternal Uncle   ?     dx. >50  ? Colon cancer Paternal Aunt   ?     dx. 75s  ? Heart attack Paternal Uncle   ? Stroke Maternal Grandmother   ? Alzheimer's disease Paternal Grandmother   ? Heart disease Paternal Grandfather   ? Heart attack Paternal  Grandfather   ? Healthy Daughter   ? Healthy Daughter   ? Breast cancer Cousin   ? Ovarian cancer Cousin   ?     dx. late 70s/dx. 60s  ? Lung cancer Cousin   ?     dx. 15s  ? Stomach cancer Other   ? ? ?Past Medical History:  ?Diagnosis Date  ? Achilles bursitis or tendinitis   ? Anxiety state, unspecified   ? Arthritis   ? Closed fracture of unspecified part of fibula   ? Depressive disorder, not elsewhere classified   ? Diverticulosis of colon (without mention of hemorrhage)   ? Edema   ? Fibromyalgia   ? Insomnia, unspecified   ? Lumbago   ? Mass of left ovary 06/01/2016  ? Meniere's disease 1986  ? MVP (mitral valve prolapse)   ? Other intra-abdominal and pelvic swelling, mass and lump 06/01/2016  ? Pain in joint, site unspecified   ? Peptic ulcer   ? no testing  ? Pure hypercholesterolemia   ? TSH elevation   ? ? ?Past Surgical History:  ?Procedure Laterality Date  ? ABDOMINAL HYSTERECTOMY  03/08/1980  ? fibroids; ovaries intact.  ? BACK SURGERY    ? x2 Cervical and lumbar  ? CYSTOSCOPY N/A 06/08/2016  ? Procedure: CYSTOSCOPY;  Surgeon:  Will Bonnet, MD;  Location: [facility] ORS;  Service: Gynecology;  Laterality: N/A;  ? LAPAROSCOPIC LYSIS OF ADHESIONS  06/08/2016  ? Procedure: LAPAROSCOPIC LYSIS OF ADHESIONS;  Surgeon: Will Bonnet, MD;  Location: [facility] ORS;  Service: Gynecology;;  ? LAPAROSCOPIC SALPINGO OOPHERECTOMY Left 06/08/2016  ? Procedure: LAPAROSCOPIC SALPINGO OOPHORECTOMY;  Surgeon: Will Bonnet, MD;  Location: [facility] ORS;  Service: Gynecology;  Laterality: Left;  ? OOPHORECTOMY    ? left ovary  ? SALPINGECTOMY    ? left  ? SPINE SURGERY    ? lumbar spine 11/2015; cervical spine 2009. [facility].  ? ? ?Social History:  reports that she has never smoked. She has never used smokeless tobacco. She reports current alcohol use. She reports that she does not use drugs. ? ?Allergies:  ?Allergies  ?Allergen Reactions  ? Demerol [Meperidine] Nausea And Vomiting  ? Kenalog [Triamcinolone Acetonide] Other (See  Comments)  ?  Cause vertigo/dizziness  ? Adhesive [Tape] Rash and Other (See Comments)  ?  PAPER TAPE OK  ? ? ?(Not in a hospital admission) ? ? ? ?Physical Exam: ?Blood pressure (!) 109/46, pulse 88, temperature (!) 100.9 ?F (38.3 ?C), temperature source Oral, resp. rate 14, SpO2 96 %. ?General: resting comfortably, appears stated age, no apparent distress ?Neurological: alert and oriented, no focal deficits, cranial nerves grossly in tact ?HEENT: normocephalic, atraumatic, oropharynx clear, no scleral icterus ?CV: regular rate and rhythm, extremities warm and well-perfused ?Respiratory: normal work of breathing, symmetric chest wall expansion ?Abdomen: soft, nondistended, focally tender to palpation in the LLQ and suprapubic area. ?Extremities: warm and well-perfused, no deformities, moving all extremities spontaneously ?Psychiatric: normal mood and affect ?Skin: warm and dry, no jaundice, no rashes or lesions ? ? ?Results for orders placed or performed during the hospital encounter of 06/20/21 (from the past 48 hour(s))  ?Lipase, blood     Status: None  ? Collection Time: 06/20/21  2:17 PM  ?Result Value Ref Range  ? Lipase 25 11 - 51 U/L  ?  Comment: Performed at [facility], [place] [address], [place], [place] [address]  ?Comprehensive metabolic panel     Status: Abnormal  ? Collection Time: 06/20/21  2:17 PM  ?Result Value Ref Range  ? Sodium 135 135 - 145 mmol/L  ? Potassium 3.7 3.5 - 5.1 mmol/L  ? Chloride 99 98 - 111 mmol/L  ? CO2 28 22 - 32 mmol/L  ? Glucose, Bld 141 (H) 70 - 99 mg/dL  ?  Comment: Glucose reference range applies only to samples taken after fasting for at least 8 hours.  ? BUN 13 8 - 23 mg/dL  ? Creatinine, Ser 0.67 0.44 - 1.00 mg/dL  ? Calcium 9.1 8.9 - 10.3 mg/dL  ? Total Protein 7.3 6.5 - 8.1 g/dL  ? Albumin 3.3 (L) 3.5 - 5.0 g/dL  ? AST 15 15 - 41 U/L  ? ALT 11 0 - 44 U/L  ? Alkaline Phosphatase 50 38 - 126 U/L  ? Total Bilirubin 0.8 0.3 - 1.2 mg/dL  ? GFR, Estimated >60 >60 mL/min   ?  Comment: (NOTE) ?Calculated using the CKD-EPI Creatinine Equation (2021) ?  ? Anion gap 8 5 - 15  ?  Comment: Performed at [facility], [place] [address], [place], [place] [address]  ?CBC     Status: Abnormal  ? Collection Time: 06/20/21  2:17 PM  ?Result Value Ref Range  ? WBC 17.2 (H) 4.0 - 10.5 K/uL  ?  RBC 3.86 (L) 3.87 - 5.11 MIL/uL  ? Hemoglobin 12.6 12.0 - 15.0 g/dL  ? HCT 37.9 36.0 - 46.0 %  ? MCV 98.2 80.0 - 100.0 fL  ? MCH 32.6 26.0 - 34.0 pg  ? MCHC 33.2 30.0 - 36.0 g/dL  ? RDW 12.6 11.5 - 15.5 %  ? Platelets 335 150 - 400 K/uL  ? nRBC 0.0 0.0 - 0.2 %  ?  Comment: Performed at [facility], [place] [address] [place],  [address]  ?Protime-INR     Status: Abnormal  ? Collection Time: 06/20/21  2:17 PM  ?Result Value Ref Range  ? Prothrombin Time 15.6 (H) 11.4 - 15.2 seconds  ? INR 1.3 (H) 0.8 - 1.2  ?  Comment: (NOTE) ?INR goal varies based on device and disease states. ?Performed at [facility], [place] [address], [place], [place] ?[address] ?  ?Lactic acid, plasma     Status: None  ? Collection Time: 06/20/21  2:18 PM  ?Result Value Ref Range  ? Lactic Acid, Venous 1.7 0.5 - 1.9 mmol/L  ?  Comment: Performed at [facility], [place] [address], [place], [place] [address]  ?Resp Panel by RT-PCR (Flu A&B, Covid) Nasopharyngeal Swab     Status: None  ? Collection Time: 06/20/21  3:53 PM  ? Specimen: Nasopharyngeal Swab; Nasopharyngeal(NP) swabs in vial transport medium  ?Result Value Ref Range  ? SARS Coronavirus 2 by RT PCR NEGATIVE NEGATIVE  ?  Comment: (NOTE) ?SARS-CoV-2 target nucleic acids are NOT DETECTED. ? ?The SARS-CoV-2 RNA is generally detectable in upper respiratory ?specimens during the acute phase of infection. The lowest ?concentration of SARS-CoV-2 viral copies this assay can detect is ?138 copies/mL. A negative result does not preclude SARS-Cov-2 ?infection and should not be used as the sole basis for treatment or ?other patient management decisions. A negative result  may occur with  ?improper specimen collection/handling, submission of specimen other ?than nasopharyngeal swab, presence of viral mutation(s) within the ?areas targeted by this assay, and inadequate number of viral ?

## 2021-06-20 NOTE — ED Notes (Addendum)
Bp inaccurate due to movement ?

## 2021-06-20 NOTE — Progress Notes (Signed)
Pharmacy Antibiotic Note ? ?Tracy Estrada is a 71 y.o. female admitted on 06/20/2021 with  intra-abdominal infection .  Pharmacy has been consulted for cefepime dosing. ? ?WBC elevated, SCr wnl  ? ?Plan: ?-Cefepime 2 gm IV Q 8 hours ?-Flagyl per MD  ?-Monitor CBC, renal fx, cultures and clinical progress ? ?  ? ?Temp (24hrs), Avg:100.9 ?F (38.3 ?C), Min:100.9 ?F (38.3 ?C), Max:100.9 ?F (38.3 ?C) ? ?Recent Labs  ?Lab 06/20/21 ?1417 06/20/21 ?1418 06/20/21 ?1655  ?WBC 17.2*  --   --   ?CREATININE 0.67  --   --   ?LATICACIDVEN  --  1.7 1.6  ?  ?CrCl cannot be calculated (Unknown ideal weight.).   ? ?Allergies  ?Allergen Reactions  ? Demerol [Meperidine] Nausea And Vomiting  ? Kenalog [Triamcinolone Acetonide] Other (See Comments)  ?  Cause vertigo/dizziness  ? Adhesive [Tape] Rash and Other (See Comments)  ?  PAPER TAPE OK  ? ? ?Antimicrobials this admission: ?Cefepime 4/15 >>  ?Flagyl 4/15 >>  ? ?Dose adjustments this admission: ? ? ?Microbiology results: ?4/15 BCx:  ? ?Thank you for allowing pharmacy to be a part of this patient?s care. ? ?Albertina Parr, PharmD., BCCCP ?Clinical Pharmacist ?Please refer to AMION for unit-specific pharmacist  ? ? ?

## 2021-06-20 NOTE — Sepsis Progress Note (Signed)
Elink following for Sepsis Protocol 

## 2021-06-20 NOTE — ED Provider Notes (Signed)
?[facility] ?Provider Note ? ? ?CSN: [id number] ?Arrival date & time: 06/20/21  1344 ? ?  ? ?History ? ?Chief Complaint  ?Patient presents with  ? Abdominal Pain  ? ? ?Tracy Estrada is a 71 y.o. female.  Presents to the emergency room with concern for abdominal pain.  Patient states that she has had abdominal pain for about a week, seems to be worsening over the last couple days.  Has had some associated nausea but no vomiting.  Was not aware of any fever at home.  States that she has had about 20 pounds of unintentional weight loss since fall of last year.  She had associated this with a COVID infection last fall.  Has had general malaise and fatigue over the last few months.  States that she had a colonoscopy many years ago that she was told was reassuring.  Does not remember the exact year.  Does not remember her GI.  She denies prior history of cancer. ? ?HPI ? ?  ? ?Home Medications ?Prior to Admission medications   ?Medication Sig Start Date End Date Taking? Authorizing Provider  ?acetaminophen (TYLENOL) 325 MG tablet Take 650 mg by mouth every 6 (six) hours as needed.    [provider]  ?buPROPion (WELLBUTRIN XL) 150 MG 24 hr tablet Take 2 tablets (300 mg total) by mouth daily. For depression. 03/24/21   [name], NP  ?busPIRone (BUSPAR) 5 MG tablet TAKE 1 TABLET (5 MG TOTAL) BY MOUTH 2 (TWO) TIMES DAILY. FOR ANXIETY 04/19/21   [name], NP  ?Capsaicin-Menthol (SALONPAS GEL-PATCH HOT EX) Apply topically.    [provider]  ?Cholecalciferol (VITAMIN D3 PO) Take by mouth.    [provider]  ?ELDERBERRY PO Take by mouth.    [provider]  ?fluticasone (FLONASE) 50 MCG/ACT nasal spray Place 2 sprays into the nose daily as needed for allergies.     [provider]  ?Misc Natural Products (NEURIVA PO) Take by mouth.    [provider]  ?Multiple Vitamins-Minerals (MULTIVITAMIN WITH MINERALS) tablet  Take 1 tablet by mouth daily. Centrum Silver for Women    [provider]  ?Omega-3 Fatty Acids (FISH OIL) 500 MG CAPS Take 500 mg by mouth daily.    [provider]  ?POTASSIUM PO Take by mouth.    [provider]  ?pravastatin (PRAVACHOL) 40 MG tablet TAKE 1 TABLET BY MOUTH EVERY DAY FOR CHOLESTEROL 05/17/21   [name], NP  ?vitamin B-12 (CYANOCOBALAMIN) 1000 MCG tablet Take 1,000 mcg by mouth daily.    [provider]  ?   ? ?Allergies    ?Demerol [meperidine], Kenalog [triamcinolone acetonide], and Adhesive [tape]   ? ?Review of Systems   ?Review of Systems  ?Constitutional:  Positive for chills, fatigue and fever.  ?HENT:  Negative for ear pain and sore throat.   ?Eyes:  Negative for pain and visual disturbance.  ?Respiratory:  Negative for cough and shortness of breath.   ?Cardiovascular:  Negative for chest pain and palpitations.  ?Gastrointestinal:  Positive for abdominal pain and nausea. Negative for vomiting.  ?Genitourinary:  Negative for dysuria and hematuria.  ?Musculoskeletal:  Negative for arthralgias and back pain.  ?Skin:  Negative for color change and rash.  ?Neurological:  Negative for seizures and syncope.  ?All other systems reviewed and are negative. ? ?Physical Exam ?Updated Vital Signs ?BP 126/73 (BP Location: Left Arm)   Pulse (!) 127  Temp (!) 100.9 ?F (38.3 ?C) (Oral)   Resp 18   SpO2 99%  ?Physical Exam ?Vitals and nursing note reviewed.  ?Constitutional:   ?   General: She is not in acute distress. ?   Appearance: She is well-developed.  ?HENT:  ?   Head: Normocephalic and atraumatic.  ?Eyes:  ?   Conjunctiva/sclera: Conjunctivae normal.  ?Cardiovascular:  ?   Rate and Rhythm: Regular rhythm. Tachycardia present.  ?   Heart sounds: No murmur heard. ?Pulmonary:  ?   Effort: Pulmonary effort is normal. No respiratory distress.  ?   Breath sounds: Normal breath sounds.  ?Abdominal:  ?   Palpations: Abdomen is soft.  ?   Tenderness: There is  abdominal tenderness.  ?   Comments: Generalized tenderness to palpation on exam  ?Musculoskeletal:     ?   General: No swelling.  ?   Cervical back: Neck supple.  ?Skin: ?   General: Skin is warm and dry.  ?   Capillary Refill: Capillary refill takes less than 2 seconds.  ?Neurological:  ?   Mental Status: She is alert.  ?Psychiatric:     ?   Mood and Affect: Mood normal.  ? ? ?ED Results / Procedures / Treatments   ?Labs ?(all labs ordered are listed, but only abnormal results are displayed) ?Labs Reviewed  ?COMPREHENSIVE METABOLIC PANEL - Abnormal; Notable for the following components:  ?    Result Value  ? Glucose, Bld 141 (*)   ? Albumin 3.3 (*)   ? All other components within normal limits  ?CBC - Abnormal; Notable for the following components:  ? WBC 17.2 (*)   ? RBC 3.86 (*)   ? All other components within normal limits  ?URINALYSIS, ROUTINE W REFLEX MICROSCOPIC - Abnormal; Notable for the following components:  ? Color, Urine AMBER (*)   ? APPearance HAZY (*)   ? Ketones, ur 20 (*)   ? Protein, ur 30 (*)   ? All other components within normal limits  ?PROTIME-INR - Abnormal; Notable for the following components:  ? Prothrombin Time 15.6 (*)   ? INR 1.3 (*)   ? All other components within normal limits  ?CULTURE, BLOOD (ROUTINE X 2)  ?CULTURE, BLOOD (ROUTINE X 2)  ?RESP PANEL BY RT-PCR (FLU A&B, COVID) ARPGX2  ?LIPASE, BLOOD  ?LACTIC ACID, PLASMA  ?LACTIC ACID, PLASMA  ?TROPONIN I (HIGH SENSITIVITY)  ?TROPONIN I (HIGH SENSITIVITY)  ? ? ?EKG ?None ? ?Radiology ?DG Chest 2 View ? ?Result Date: 06/20/2021 ?CLINICAL DATA:  Sepsis EXAM: CHEST - 2 VIEW COMPARISON:  Chest x-ray 11/11/2005 FINDINGS: Heart size and mediastinal contours are within normal limits. No suspicious pulmonary opacities identified. No pleural effusion or pneumothorax visualized. No acute osseous abnormality appreciated. IMPRESSION: No acute intrathoracic process identified. Electronically Signed   By: Ofilia Neas M.D.   On: 06/20/2021  15:22  ? ?CT ABDOMEN PELVIS W CONTRAST ? ?Result Date: 06/20/2021 ?CLINICAL DATA:  Abdominal pain, acute, nonlocalized EXAM: CT ABDOMEN AND PELVIS WITH CONTRAST TECHNIQUE: Multidetector CT imaging of the abdomen and pelvis was performed using the standard protocol following bolus administration of intravenous contrast. RADIATION DOSE REDUCTION: This exam was performed according to the departmental dose-optimization program which includes automated exposure control, adjustment of the mA and/or kV according to patient size and/or use of iterative reconstruction technique. CONTRAST:  157m OMNIPAQUE IOHEXOL 300 MG/ML  SOLN COMPARISON:  CT abdomen 11/16/2005 FINDINGS: Lower chest: No acute abnormality. Hepatobiliary: No focal liver  abnormality. No gallstones, gallbladder wall thickening, or pericholecystic fluid. No biliary dilatation. Pancreas: No focal lesion. Normal pancreatic contour. No surrounding inflammatory changes. No main pancreatic ductal dilatation. Normal in size without focal abnormality. Spleen: Normal in size without focal abnormality. Adrenals/Urinary Tract: No adrenal nodule bilaterally. Bilateral kidneys enhance symmetrically. No hydronephrosis. No hydroureter. The urinary bladder is unremarkable. Fat stranding of the fat plane within the sigmoid colon and urinary bladder likely reactive with no definite fistula formation to the urinary bladder. On delayed imaging, there is no urothelial wall thickening and there are no filling defects in the opacified portions of the bilateral collecting systems or ureters. Stomach/Bowel: Stomach is within normal limits. Second portion of the duodenum diverticula. Scattered colonic diverticulosis. Irregular bowel wall thickening of the sigmoid colon with associated likely intramural abscess formation (3: 36-58). Associated perisigmoid fat stranding. Small of bowel appears to be inseparable from this complex collection within the lower abdomen/pelvis (7:16, 3:37).  Appendix appears normal. Vascular/Lymphatic: No abdominal aorta or iliac aneurysm. No abdominal, pelvic, or inguinal lymphadenopathy. Reproductive: Indeterminate 3.8 cm right adnexal fluid collection. Status post

## 2021-06-20 NOTE — ED Notes (Signed)
1st set of BC at 1655 ?

## 2021-06-20 NOTE — ED Triage Notes (Signed)
Pt states she believes she has a UTI.  Denies urinary complaints.  Reports lower abd pain and nausea x 1 week. ?

## 2021-06-21 ENCOUNTER — Inpatient Hospital Stay (HOSPITAL_COMMUNITY): Payer: Medicare Other

## 2021-06-21 DIAGNOSIS — F419 Anxiety disorder, unspecified: Secondary | ICD-10-CM | POA: Diagnosis not present

## 2021-06-21 DIAGNOSIS — K651 Peritoneal abscess: Secondary | ICD-10-CM | POA: Diagnosis not present

## 2021-06-21 DIAGNOSIS — R198 Other specified symptoms and signs involving the digestive system and abdomen: Secondary | ICD-10-CM

## 2021-06-21 DIAGNOSIS — K5792 Diverticulitis of intestine, part unspecified, without perforation or abscess without bleeding: Secondary | ICD-10-CM | POA: Diagnosis not present

## 2021-06-21 DIAGNOSIS — F32A Depression, unspecified: Secondary | ICD-10-CM

## 2021-06-21 DIAGNOSIS — D649 Anemia, unspecified: Secondary | ICD-10-CM

## 2021-06-21 HISTORY — DX: Peritoneal abscess: K65.1

## 2021-06-21 LAB — RETICULOCYTES
Immature Retic Fract: 14.4 % (ref 2.3–15.9)
RBC.: 3.24 MIL/uL — ABNORMAL LOW (ref 3.87–5.11)
Retic Count, Absolute: 31.4 K/uL (ref 19.0–186.0)
Retic Ct Pct: 1 % (ref 0.4–3.1)

## 2021-06-21 LAB — COMPREHENSIVE METABOLIC PANEL WITH GFR
ALT: 9 U/L (ref 0–44)
AST: 12 U/L — ABNORMAL LOW (ref 15–41)
Albumin: 2.6 g/dL — ABNORMAL LOW (ref 3.5–5.0)
Alkaline Phosphatase: 43 U/L (ref 38–126)
Anion gap: 8 (ref 5–15)
BUN: 11 mg/dL (ref 8–23)
CO2: 28 mmol/L (ref 22–32)
Calcium: 8.3 mg/dL — ABNORMAL LOW (ref 8.9–10.3)
Chloride: 100 mmol/L (ref 98–111)
Creatinine, Ser: 0.55 mg/dL (ref 0.44–1.00)
GFR, Estimated: >60 mL/min
Glucose, Bld: 112 mg/dL — ABNORMAL HIGH (ref 70–99)
Potassium: 3.8 mmol/L (ref 3.5–5.1)
Sodium: 136 mmol/L (ref 135–145)
Total Bilirubin: 0.8 mg/dL (ref 0.3–1.2)
Total Protein: 6 g/dL — ABNORMAL LOW (ref 6.5–8.1)

## 2021-06-21 LAB — CBC
HCT: 31.9 % — ABNORMAL LOW (ref 36.0–46.0)
Hemoglobin: 10.4 g/dL — ABNORMAL LOW (ref 12.0–15.0)
MCH: 32 pg (ref 26.0–34.0)
MCHC: 32.6 g/dL (ref 30.0–36.0)
MCV: 98.2 fL (ref 80.0–100.0)
Platelets: 272 10*3/uL (ref 150–400)
RBC: 3.25 MIL/uL — ABNORMAL LOW (ref 3.87–5.11)
RDW: 12.6 % (ref 11.5–15.5)
WBC: 11.8 10*3/uL — ABNORMAL HIGH (ref 4.0–10.5)
nRBC: 0 % (ref 0.0–0.2)

## 2021-06-21 LAB — FERRITIN: Ferritin: 477 ng/mL — ABNORMAL HIGH (ref 11–307)

## 2021-06-21 LAB — VITAMIN B12: Vitamin B-12: 562 pg/mL (ref 180–914)

## 2021-06-21 LAB — TROPONIN I (HIGH SENSITIVITY): Troponin I (High Sensitivity): 9 ng/L (ref <18)

## 2021-06-21 LAB — PROCALCITONIN: Procalcitonin: 0.28 ng/mL

## 2021-06-21 LAB — IRON AND TIBC
Iron: 12 ug/dL — ABNORMAL LOW (ref 28–170)
Saturation Ratios: 6 % — ABNORMAL LOW (ref 10.4–31.8)
TIBC: 209 ug/dL — ABNORMAL LOW (ref 250–450)
UIBC: 197 ug/dL

## 2021-06-21 LAB — LACTIC ACID, PLASMA: Lactic Acid, Venous: 0.9 mmol/L (ref 0.5–1.9)

## 2021-06-21 LAB — FOLATE: Folate: 16.1 ng/mL (ref >5.9)

## 2021-06-21 MED ORDER — SODIUM CHLORIDE 0.9% FLUSH
5.0000 mL | Freq: Three times a day (TID) | INTRAVENOUS | Status: DC
  Administered 2021-06-21 – 2021-06-27 (×12): 5 mL

## 2021-06-21 MED ORDER — FENTANYL CITRATE (PF) 100 MCG/2ML IJ SOLN
INTRAMUSCULAR | Status: AC | PRN
  Administered 2021-06-21 (×2): 25 ug via INTRAVENOUS

## 2021-06-21 MED ORDER — LIDOCAINE HCL 1 % IJ SOLN
INTRAMUSCULAR | Status: AC
  Filled 2021-06-21: qty 10

## 2021-06-21 MED ORDER — FENTANYL CITRATE (PF) 100 MCG/2ML IJ SOLN
INTRAMUSCULAR | Status: AC
  Filled 2021-06-21: qty 4

## 2021-06-21 MED ORDER — MIDAZOLAM HCL 2 MG/2ML IJ SOLN
INTRAMUSCULAR | Status: AC | PRN
  Administered 2021-06-21: .5 mg via INTRAVENOUS
  Administered 2021-06-21: 1 mg via INTRAVENOUS

## 2021-06-21 MED ORDER — MIDAZOLAM HCL 2 MG/2ML IJ SOLN
INTRAMUSCULAR | Status: AC
  Filled 2021-06-21: qty 4

## 2021-06-21 MED ORDER — BUSPIRONE HCL 5 MG PO TABS
5.0000 mg | ORAL_TABLET | Freq: Two times a day (BID) | ORAL | Status: DC
  Administered 2021-06-22 – 2021-06-27 (×9): 5 mg via ORAL
  Filled 2021-06-21 (×11): qty 1

## 2021-06-21 NOTE — Assessment & Plan Note (Addendum)
Patient noted to have abscess as well.  General surgery consulted.  IR has been consulted for aspiration and possible drain tube placement.  Continue n.p.o. status.  Follow-up on cultures.  Noted to be on cefepime and metronidazole.  WBC 11.8.  Procalcitonin 0.28.  Lactic acid level was normal.  Dysuria is likely due to irritation of the bladder from the abscess.  UA does not suggest UTI. ?Pain medications. ?

## 2021-06-21 NOTE — Progress Notes (Signed)
? ?[facility] ?PROGRESS NOTE ? ? ?Tracy Estrada NID:[id number] DOB: February 06, 1951 DOA: 06/20/2021  1 ?DOS: the patient was seen and examined on 06/21/2021 ? ?PCP: [name], NP ? ?Brief History and Hospital Course:  ?71 y.o. female with medical history significant of  HLD,osteoarthritis, degenerative disc disease and fibromyalgia syndrome, depression anxiety, as well as recurrent UTI. Patient also has interim history of visit to UC for urinary symptoms and left sided abdominal pain on 06/15/21 for which she was treated  with ciprofloxacin, urine culture grew Klebsiella. Patient states she took 2-3 days of  7/day course.  Patient return to the ED for persistent left-sided abdominal pain.  CT scan of the abdomen pelvis reviewed sigmoid diverticulitis with abscess.  General surgery was consulted.  Patient was hospitalized for further management.   ? ?Consultants: General surgery.  Interventional radiology ? ?Procedures: None yet ? ? ? ?Subjective: ?Patient mentions that pain is 4 out of 10 in intensity.  Some nausea but no vomiting.  Feeling thirsty.  Does mention some dysuria. ? ? ? ?Assessment/Plan: ? ?* Acute diverticulitis ?Patient noted to have abscess as well.  General surgery consulted.  IR has been consulted for aspiration and possible drain tube placement.  Continue n.p.o. status.  Follow-up on cultures.  Noted to be on cefepime and metronidazole.  WBC 11.8.  Procalcitonin 0.28.  Lactic acid level was normal.  Dysuria is likely due to irritation of the bladder from the abscess.  UA does not suggest UTI. ?Pain medications. ? ?Intra-abdominal abscess ([place]) ?See above. ? ?Right pelvic adnexal fluid collection ?Noted incidentally on CT scan.  She will need further work-up in the outpatient setting. ? ?Normocytic anemia ?Check anemia panel.  No evidence of overt blood loss. ? ?Anxiety and depression ?Noted to be on Wellbutrin and BuSpar which will be continued. ? ?Chronic right shoulder pain ?She mentions  that she needs to undergo shoulder surgery.  Deferred to outpatient setting. ? ? ? ?DVT Prophylaxis: SCDs for now in case any procedures planned.  Can be switched over to chemoprophylaxis subsequently. ?Code Status: Full code ?Family Communication: Discussed with patient.  No family at bedside ?Disposition Plan: Hopefully return home when improved.  Will need to be mobilized eventually. ? ?Status is: Inpatient ?Remains inpatient appropriate because: Acute diverticulitis with abscess requiring IV antibiotics and aspiration with high risk for needing surgical intervention ? ? ? ? ?Medications: Scheduled: ? buPROPion  300 mg Oral Daily  ? busPIRone  5 mg Oral BID  ? ?Continuous: ? ceFEPime (MAXIPIME) IV Stopped (06/21/21 1443)  ? lactated ringers 125 mL/hr at 06/21/21 0839  ? metronidazole 500 mg (06/21/21 1540)  ? ?GQQ:[id number] **OR** acetaminophen, albuterol, fentaNYL (SUBLIMAZE) injection, HYDROcodone-acetaminophen, ondansetron **OR** ondansetron (ZOFRAN) IV ? ?Antibiotics: ?Anti-infectives (From admission, onward)  ? ? Start     Dose/Rate Route Frequency Ordered Stop  ? 06/21/21 0600  ceFEPIme (MAXIPIME) 2 g in sodium chloride 0.9 % 100 mL IVPB       ? 2 g ?200 mL/hr over 30 Minutes Intravenous Every 8 hours 06/20/21 2055    ? 06/20/21 2200  piperacillin-tazobactam (ZOSYN) IVPB 3.375 g  Status:  Discontinued       ? 3.375 g ?100 mL/hr over 30 Minutes Intravenous Every 8 hours 06/20/21 1816 06/20/21 2051  ? 06/20/21 2030  ceFEPIme (MAXIPIME) 2 g in sodium chloride 0.9 % 100 mL IVPB       ? 2 g ?200 mL/hr over 30 Minutes Intravenous  Once 06/20/21 2029 06/20/21  2240  ? 06/20/21 2030  metroNIDAZOLE (FLAGYL) IVPB 500 mg       ? 500 mg ?100 mL/hr over 60 Minutes Intravenous Every 12 hours 06/20/21 2029 06/27/21 2029  ? ?  ? ? ?Objective: ? ?Vital Signs ? ?Vitals:  ? 06/21/21 0545 06/21/21 0600 06/21/21 0615 06/21/21 0630  ?BP: (!) 116/59 (!) 122/58 (!) 122/56 (!) 122/57  ?Pulse: (!) 102 99 99 97  ?Resp: (!) 24 (!)  25 (!) 23 (!) 23  ?Temp:      ?TempSrc:      ?SpO2: 96% 96% 96% 97%  ? ? ?Intake/Output Summary (Last 24 hours) at 06/21/2021 0839 ?Last data filed at 06/20/2021 2022 ?Gross per 24 hour  ?Intake 1500 ml  ?Output --  ?Net 1500 ml  ? ?There were no vitals filed for this visit. ? ?General appearance: Awake alert.  In no distress ?Resp: Clear to auscultation bilaterally.  Normal effort ?Cardio: S1-S2 is normal regular.  No S3-S4.  No rubs murmurs or bruit ?GI: Abdomen is soft.  Very tender in the left lower quadrant.  Guarding is present.  Some rebound is present as well.  No bowel sounds heard. ?Extremities: No edema.  Moving all of her extremities ?Neurologic:   No focal neurological deficits.  ? ? ?Lab Results: ? ?Data Reviewed: I have personally reviewed labs and imaging study reports ? ?CBC: ?Recent Labs  ?Lab 06/20/21 ?1417 06/20/21 ?2137 06/21/21 ?0500  ?WBC 17.2* 11.9* 11.8*  ?NEUTROABS  --  9.7*  --   ?HGB 12.6 11.0* 10.4*  ?HCT 37.9 33.7* 31.9*  ?MCV 98.2 98.8 98.2  ?PLT 335 252 272  ? ? ?Basic Metabolic Panel: ?Recent Labs  ?Lab 06/20/21 ?1417 06/21/21 ?0500  ?NA 135 136  ?K 3.7 3.8  ?CL 99 100  ?CO2 28 28  ?GLUCOSE 141* 112*  ?BUN 13 11  ?CREATININE 0.67 0.55  ?CALCIUM 9.1 8.3*  ? ? ?GFR: ?CrCl cannot be calculated (Unknown ideal weight.). ? ?Liver Function Tests: ?Recent Labs  ?Lab 06/20/21 ?1417 06/21/21 ?0500  ?AST 15 12*  ?ALT 11 9  ?ALKPHOS 50 43  ?BILITOT 0.8 0.8  ?PROT 7.3 6.0*  ?ALBUMIN 3.3* 2.6*  ? ? ?Recent Labs  ?Lab 06/20/21 ?1417  ?LIPASE 25  ? ? ?Coagulation Profile: ?Recent Labs  ?Lab 06/20/21 ?1417  ?INR 1.3*  ? ? ? ? ?Thyroid Function Tests: ?Recent Labs  ?  06/20/21 ?2137  ?TSH 2.152  ? ? ?Recent Results (from the past 240 hour(s))  ?Culture, blood (Routine x 2)     Status: None (Preliminary result)  ? Collection Time: 06/20/21  2:29 PM  ? Specimen: BLOOD  ?Result Value Ref Range Status  ? Specimen Description BLOOD RIGHT ANTECUBITAL  Final  ? Special Requests   Final  ?  BOTTLES DRAWN  AEROBIC AND ANAEROBIC Blood Culture adequate volume  ? Culture   Final  ?  NO GROWTH < 24 HOURS ?Performed at [facility], [place] [address], [place], [place] [address] ?  ? Report Status PENDING  Incomplete  ?Resp Panel by RT-PCR (Flu A&B, Covid) Nasopharyngeal Swab     Status: None  ? Collection Time: 06/20/21  3:53 PM  ? Specimen: Nasopharyngeal Swab; Nasopharyngeal(NP) swabs in vial transport medium  ?Result Value Ref Range Status  ? SARS Coronavirus 2 by RT PCR NEGATIVE NEGATIVE Final  ?  Comment: (NOTE) ?SARS-CoV-2 target nucleic acids are NOT DETECTED. ? ?The SARS-CoV-2 RNA is generally detectable in upper respiratory ?specimens during  the acute phase of infection. The lowest ?concentration of SARS-CoV-2 viral copies this assay can detect is ?138 copies/mL. A negative result does not preclude SARS-Cov-2 ?infection and should not be used as the sole basis for treatment or ?other patient management decisions. A negative result may occur with  ?improper specimen collection/handling, submission of specimen other ?than nasopharyngeal swab, presence of viral mutation(s) within the ?areas targeted by this assay, and inadequate number of viral ?copies(<138 copies/mL). A negative result must be combined with ?clinical observations, patient history, and epidemiological ?information. The expected result is Negative. ? ?Fact Sheet for Patients:  ?EntrepreneurPulse.com.au ? ?Fact Sheet for Healthcare Providers:  ?IncredibleEmployment.be ? ?This test is no t yet approved or cleared by the [place] FDA and  ?has been authorized for detection and/or diagnosis of SARS-CoV-2 by ?FDA under an Emergency Use Authorization (EUA). This EUA will remain  ?in effect (meaning this test can be used) for the duration of the ?COVID-19 declaration under Section 564(b)(1) of the Act, 21 ?U.S.C.section 360bbb-3(b)(1), unless the authorization is terminated  ?or revoked sooner.  ? ? ?  ? Influenza A  by PCR NEGATIVE NEGATIVE Final  ? Influenza B by PCR NEGATIVE NEGATIVE Final  ?  Comment: (NOTE) ?The Xpert Xpress SARS-CoV-2/FLU/RSV plus assay is intended as an aid ?in the diagnosis of influenza from N

## 2021-06-21 NOTE — Hospital Course (Signed)
71 y.o. female with medical history significant of  HLD,osteoarthritis, degenerative disc disease and fibromyalgia syndrome, depression anxiety, as well as recurrent UTI. Patient also has interim history of visit to UC for urinary symptoms and left sided abdominal pain on 06/15/21 for which she was treated  with ciprofloxacin, urine culture grew Klebsiella. Patient states she took 2-3 days of  7/day course.  Patient return to the ED for persistent left-sided abdominal pain.  CT scan of the abdomen pelvis reviewed sigmoid diverticulitis with abscess.  General surgery was consulted.  Patient was hospitalized for further management.   ?

## 2021-06-21 NOTE — Assessment & Plan Note (Signed)
She mentions that she needs to undergo shoulder surgery.  Deferred to outpatient setting. ?

## 2021-06-21 NOTE — ED Notes (Signed)
Pt asleep, resting comfortably at this time. Visible rise and fall of chest noted. Pt appears in NAD, VS WNL.  ?

## 2021-06-21 NOTE — ED Notes (Signed)
Assisted pt to the restroom and back to room. Minimal assist.  ?

## 2021-06-21 NOTE — ED Notes (Signed)
Pt to IR for drain placement

## 2021-06-21 NOTE — Procedures (Signed)
Interventional Radiology Procedure Note ? ?Procedure: CT DRAIN 12FR ANTERIOR PELVIC ABSCESS   ? ?Complications: None ? ?Estimated Blood Loss:  MIN ? ?Findings: ?BLOODY FECAL CONTAMINATED PURULENT FLD ?CX SENT   ? ?M. Daryll Brod, MD ? ? ? ?

## 2021-06-21 NOTE — Consult Note (Signed)
? ?Chief Complaint: ?Patient was seen in consultation today for diverticular abscess drain placement ?Chief Complaint  ?Patient presents with  ? Abdominal Pain  ? at the request of Dr Burnard Hawthorne ? ? ?Supervising Physician: Daryll Brod ? ?Patient Status: [facility] - ED ? ?History of Present Illness: ?Tracy Estrada is a 71 y.o. female  ? ?LLQ pan ?Suprapubic pain also ?Recent treatment for UTI ?To ED  ?CT: yesterday: ?IMPRESSION: ?1. Findings likely represent complicated acute sigmoid ?diverticulitis with a 10 x 6 cm intramural abscess formation along ?the mid sigmoid colon. Query associated fistula with a small bowel ?loop in the right mid abdomen. Underlying malignancy is not ?excluded. Recommend surgical consultation. ?2. Indeterminate 3.8 cm right adnexal fluid collection. Recommend ?follow-up US in 6-12 months ? ?[facility] has evaluated pt ?Asking for IR drain placement ?Dr Annamaria Boots has reviewed imaging and approves procedure ? ? ? ?Past Medical History:  ?Diagnosis Date  ? Achilles bursitis or tendinitis   ? Anxiety state, unspecified   ? Arthritis   ? Closed fracture of unspecified part of fibula   ? Depressive disorder, not elsewhere classified   ? Diverticulosis of colon (without mention of hemorrhage)   ? Edema   ? Fibromyalgia   ? Insomnia, unspecified   ? Lumbago   ? Mass of left ovary 06/01/2016  ? Meniere's disease 1986  ? MVP (mitral valve prolapse)   ? Other intra-abdominal and pelvic swelling, mass and lump 06/01/2016  ? Pain in joint, site unspecified   ? Peptic ulcer   ? no testing  ? Pure hypercholesterolemia   ? TSH elevation   ? ? ?Past Surgical History:  ?Procedure Laterality Date  ? ABDOMINAL HYSTERECTOMY  03/08/1980  ? fibroids; ovaries intact.  ? BACK SURGERY    ? x2 Cervical and lumbar  ? CYSTOSCOPY N/A 06/08/2016  ? Procedure: CYSTOSCOPY;  Surgeon: Will Bonnet, MD;  Location: [facility] ORS;  Service: Gynecology;  Laterality: N/A;  ? LAPAROSCOPIC LYSIS OF ADHESIONS  06/08/2016  ? Procedure: LAPAROSCOPIC LYSIS  OF ADHESIONS;  Surgeon: Will Bonnet, MD;  Location: [facility] ORS;  Service: Gynecology;;  ? LAPAROSCOPIC SALPINGO OOPHERECTOMY Left 06/08/2016  ? Procedure: LAPAROSCOPIC SALPINGO OOPHORECTOMY;  Surgeon: Will Bonnet, MD;  Location: [facility] ORS;  Service: Gynecology;  Laterality: Left;  ? OOPHORECTOMY    ? left ovary  ? SALPINGECTOMY    ? left  ? SPINE SURGERY    ? lumbar spine 11/2015; cervical spine 2009. [facility].  ? ? ?Allergies: ?Demerol [meperidine], Kenalog [triamcinolone acetonide], and Adhesive [tape] ? ?Medications: ?Prior to Admission medications   ?Medication Sig Start Date End Date Taking? Authorizing Provider  ?acetaminophen (TYLENOL) 325 MG tablet Take 650 mg by mouth every 6 (six) hours as needed for moderate pain or headache.   Yes [provider]  ?buPROPion (WELLBUTRIN XL) 150 MG 24 hr tablet Take 2 tablets (300 mg total) by mouth daily. For depression. 03/24/21  Yes [name], NP  ?busPIRone (BUSPAR) 5 MG tablet TAKE 1 TABLET (5 MG TOTAL) BY MOUTH 2 (TWO) TIMES DAILY. FOR ANXIETY 04/19/21  Yes [name], NP  ?Cholecalciferol (VITAMIN D3 PO) Take 1 tablet by mouth daily. Unsure of mg   Yes [provider]  ?ELDERBERRY PO Take 5 mLs by mouth daily.   Yes [provider]  ?fluticasone (FLONASE) 50 MCG/ACT nasal spray Place 2 sprays into the nose daily as needed for allergies.    Yes [provider]  ?Geologist, engineering (  NEURIVA PO) Take 1 capsule by mouth daily.   Yes [provider]  ?Multiple Vitamins-Minerals (MULTIVITAMIN WITH MINERALS) tablet Take 1 tablet by mouth daily. Centrum Silver for Women   Yes [provider]  ?Omega-3 Fatty Acids (FISH OIL) 500 MG CAPS Take 500 mg by mouth daily.   Yes [provider]  ?POTASSIUM PO Take 99 mg by mouth daily.   Yes [provider]  ?pravastatin (PRAVACHOL) 40 MG tablet TAKE 1 TABLET BY MOUTH EVERY DAY FOR CHOLESTEROL ?Patient taking differently: Take 40 mg by  mouth at bedtime. 05/17/21  Yes [name], NP  ?vitamin B-12 (CYANOCOBALAMIN) 1000 MCG tablet Take 1,000 mcg by mouth daily.   Yes [provider]  ?Capsaicin-Menthol (SALONPAS GEL-PATCH HOT EX) Apply topically. ?Patient not taking: Reported on 06/20/2021    [provider]  ?ciprofloxacin (CIPRO) 500 MG tablet Take 500 mg by mouth See admin instructions. Bid x 5 days ?Patient not taking: Reported on 06/20/2021 06/15/21   [provider]  ?  ? ?Family History  ?Problem Relation Age of Onset  ? Hyperlipidemia Mother   ? Hypertension Mother   ? Colon polyps Mother   ? Coronary artery disease Father   ?      AMI age 70  ? Heart disease Father 27  ?     AMI x 2; first AMI in 83s  ? Breast cancer Sister 22  ?     with metastasis to bone; smoker  ? Cancer Sister 57  ?     breast cancer  ? Colon cancer Maternal Uncle   ?     dx. >50  ? Colon cancer Paternal Aunt   ?     dx. 33s  ? Heart attack Paternal Uncle   ? Stroke Maternal Grandmother   ? Alzheimer's disease Paternal Grandmother   ? Heart disease Paternal Grandfather   ? Heart attack Paternal Grandfather   ? Healthy Daughter   ? Healthy Daughter   ? Breast cancer Cousin   ? Ovarian cancer Cousin   ?     dx. late 70s/dx. 60s  ? Lung cancer Cousin   ?     dx. 10s  ? Stomach cancer Other   ? ? ?Social History  ? ?Socioeconomic History  ? Marital status: Married  ?  Spouse name: Anice Wilshire  ? Number of children: 2  ? Years of education: Not on file  ? Highest education level: Not on file  ?Occupational History  ? Occupation: retired  ?  Comment: Network engineer  ?Tobacco Use  ? Smoking status: Never  ? Smokeless tobacco: Never  ?Vaping Use  ? Vaping Use: Never used  ?Substance and Sexual Activity  ? Alcohol use: Yes  ?  Alcohol/week: 0.0 standard drinks  ?  Comment: 3-4 beers per week or wine  ? Drug use: No  ? Sexual activity: Yes  ?  Birth control/protection: Surgical  ?Other Topics Concern  ? Not on file  ?Social History Narrative  ?  Marital status:  Married x 14 years; second marriage;happily married,husband has PTSD.  ?   Children: 2 daughters [name], [name]); 7 grandchildren; 2 gg  ?    Lives: with husband, dog/beagle  ?    Employment: retired since 2013; Network engineer  ?    Tobacco: never  ?    Alcohol: weekends; 1-2 glasses of wine per week or beer.  ?    Drugs:  None  ?  Exercise: none in 2018  ?    Seatbelt: 100%  ?    Guns: unloaded.   ?  Caffeine not every day. 7 grandchildren and 2 GG. Organ donor NO.  ?    ADLs: independent with ADLs; drives  ?    Advanced Directives: YES; FULL CODE no prolonged measures.  ? ?Social Determinants of Health  ? ?Financial Resource Strain: Not on file  ?Food Insecurity: Not on file  ?Transportation Needs: Not on file  ?Physical Activity: Not on file  ?Stress: Not on file  ?Social Connections: Not on file  ? ? ?Review of Systems: A 12 point ROS discussed and pertinent positives are indicated in the HPI above.  All other systems are negative. ? ?Review of Systems  ?Constitutional:  Positive for activity change.  ?Respiratory:  Negative for cough and choking.   ?Cardiovascular:  Negative for chest pain.  ?Gastrointestinal:  Positive for abdominal pain.  ?Musculoskeletal:  Positive for back pain.  ?Psychiatric/Behavioral:  Negative for behavioral problems and confusion.   ? ?Vital Signs: ?BP (!) 122/57   Pulse 97   Temp 99.9 ?F (37.7 ?C) (Oral)   Resp (!) 23   SpO2 97%  ? ?Physical Exam ?Vitals reviewed.  ?HENT:  ?   Mouth/Throat:  ?   Mouth: Mucous membranes are moist.  ?Cardiovascular:  ?   Rate and Rhythm: Normal rate and regular rhythm.  ?Pulmonary:  ?   Effort: Pulmonary effort is normal.  ?Abdominal:  ?   Tenderness: There is abdominal tenderness.  ?Skin: ?   General: Skin is warm.  ?Neurological:  ?   Mental Status: She is alert and oriented to person, place, and time.  ?Psychiatric:     ?   Behavior: Behavior normal.  ? ? ?Imaging: ?DG Chest 2 View ? ?Result Date: 06/20/2021 ?CLINICAL DATA:  Sepsis  EXAM: CHEST - 2 VIEW COMPARISON:  Chest x-ray 11/11/2005 FINDINGS: Heart size and mediastinal contours are within normal limits. No suspicious pulmonary opacities identified. No pleural effusion or pneumothorax visual

## 2021-06-21 NOTE — Assessment & Plan Note (Signed)
Check anemia panel.  No evidence of overt blood loss. ?

## 2021-06-21 NOTE — Assessment & Plan Note (Signed)
Noted to be on Wellbutrin and BuSpar which will be continued. ?

## 2021-06-21 NOTE — Assessment & Plan Note (Signed)
See above

## 2021-06-21 NOTE — Assessment & Plan Note (Signed)
Noted incidentally on CT scan.  She will need further work-up in the outpatient setting. ?

## 2021-06-21 NOTE — Progress Notes (Signed)
? ?Progress Note ? ?   ?Subjective: ?LLQ and suprapubic pain is about stable from yesterday. She is also having burning with urination and recently had UTI which was treated with abx. On supp O2 with [place] at time of my exam but denies cough, SHOB ? ? ?Objective: ?Vital signs in last 24 hours: ?Temp:  [99.9 ?F (37.7 ?C)-100.9 ?F (38.3 ?C)] 99.9 ?F (37.7 ?C) (04/16 0403) ?Pulse Rate:  [80-127] 97 (04/16 0630) ?Resp:  [14-27] 23 (04/16 0630) ?BP: (68-133)/(41-88) 122/57 (04/16 0630) ?SpO2:  [87 %-99 %] 97 % (04/16 0630) ?  ? ?Intake/Output from previous day: ?04/15 0701 - 04/16 0700 ?In: 1500 [IV Piggyback:1500] ?Out: -  ?Intake/Output this shift: ?No intake/output data recorded. ? ?PE: ?General: pleasant, WD, female who is laying in bed in NAD ?Heart: reg rate on monitor ?Lungs: Respiratory effort nonlabored on supp O2 via [place] 3lpm. Reduce to 1 lpm during exam with o2 sat remaining >98% ?Abd: soft, ND, moderate TTP in LLQ and suprapubic regions with voluntary guarding without peritonitis ?MSK: all 4 extremities are symmetrical with no cyanosis, clubbing, or edema. ?Skin: warm and dry ?Psych: A&Ox3 with an appropriate affect.  ? ? ?Lab Results:  ?Recent Labs  ?  06/20/21 ?2137 06/21/21 ?0500  ?WBC 11.9* 11.8*  ?HGB 11.0* 10.4*  ?HCT 33.7* 31.9*  ?PLT 252 272  ? ?BMET ?Recent Labs  ?  06/20/21 ?1417 06/21/21 ?0500  ?NA 135 136  ?K 3.7 3.8  ?CL 99 100  ?CO2 28 28  ?GLUCOSE 141* 112*  ?BUN 13 11  ?CREATININE 0.67 0.55  ?CALCIUM 9.1 8.3*  ? ?PT/INR ?Recent Labs  ?  06/20/21 ?1417  ?LABPROT 15.6*  ?INR 1.3*  ? ?CMP  ?   ?Component Value Date/Time  ? NA 136 06/21/2021 0500  ? NA 141 04/07/2016 1348  ? K 3.8 06/21/2021 0500  ? CL 100 06/21/2021 0500  ? CO2 28 06/21/2021 0500  ? GLUCOSE 112 (H) 06/21/2021 0500  ? BUN 11 06/21/2021 0500  ? BUN 16 04/07/2016 1348  ? CREATININE 0.55 06/21/2021 0500  ? CREATININE 0.62 09/16/2015 1245  ? CALCIUM 8.3 (L) 06/21/2021 0500  ? PROT 6.0 (L) 06/21/2021 0500  ? PROT 6.7 04/07/2016 1348  ?  ALBUMIN 2.6 (L) 06/21/2021 0500  ? ALBUMIN 4.5 04/07/2016 1348  ? AST 12 (L) 06/21/2021 0500  ? ALT 9 06/21/2021 0500  ? ALKPHOS 43 06/21/2021 0500  ? BILITOT 0.8 06/21/2021 0500  ? BILITOT 0.4 04/07/2016 1348  ? GFRNONAA >60 06/21/2021 0500  ? GFRAA >60 06/03/2016 1101  ? ?Lipase  ?   ?Component Value Date/Time  ? LIPASE 25 06/20/2021 1417  ? ? ? ? ? ?Studies/Results: ?DG Chest 2 View ? ?Result Date: 06/20/2021 ?CLINICAL DATA:  Sepsis EXAM: CHEST - 2 VIEW COMPARISON:  Chest x-ray 11/11/2005 FINDINGS: Heart size and mediastinal contours are within normal limits. No suspicious pulmonary opacities identified. No pleural effusion or pneumothorax visualized. No acute osseous abnormality appreciated. IMPRESSION: No acute intrathoracic process identified. Electronically Signed   By: Ofilia Neas M.D.   On: 06/20/2021 15:22  ? ?CT ABDOMEN PELVIS W CONTRAST ? ?Result Date: 06/20/2021 ?CLINICAL DATA:  Abdominal pain, acute, nonlocalized EXAM: CT ABDOMEN AND PELVIS WITH CONTRAST TECHNIQUE: Multidetector CT imaging of the abdomen and pelvis was performed using the standard protocol following bolus administration of intravenous contrast. RADIATION DOSE REDUCTION: This exam was performed according to the departmental dose-optimization program which includes automated exposure control, adjustment of the mA and/or kV  according to patient size and/or use of iterative reconstruction technique. CONTRAST:  165m OMNIPAQUE IOHEXOL 300 MG/ML  SOLN COMPARISON:  CT abdomen 11/16/2005 FINDINGS: Lower chest: No acute abnormality. Hepatobiliary: No focal liver abnormality. No gallstones, gallbladder wall thickening, or pericholecystic fluid. No biliary dilatation. Pancreas: No focal lesion. Normal pancreatic contour. No surrounding inflammatory changes. No main pancreatic ductal dilatation. Normal in size without focal abnormality. Spleen: Normal in size without focal abnormality. Adrenals/Urinary Tract: No adrenal nodule bilaterally.  Bilateral kidneys enhance symmetrically. No hydronephrosis. No hydroureter. The urinary bladder is unremarkable. Fat stranding of the fat plane within the sigmoid colon and urinary bladder likely reactive with no definite fistula formation to the urinary bladder. On delayed imaging, there is no urothelial wall thickening and there are no filling defects in the opacified portions of the bilateral collecting systems or ureters. Stomach/Bowel: Stomach is within normal limits. Second portion of the duodenum diverticula. Scattered colonic diverticulosis. Irregular bowel wall thickening of the sigmoid colon with associated likely intramural abscess formation (3: 36-58). Associated perisigmoid fat stranding. Small of bowel appears to be inseparable from this complex collection within the lower abdomen/pelvis (7:16, 3:37). Appendix appears normal. Vascular/Lymphatic: No abdominal aorta or iliac aneurysm. No abdominal, pelvic, or inguinal lymphadenopathy. Reproductive: Indeterminate 3.8 cm right adnexal fluid collection. Status post hysterectomy. Other: Trace simple free fluid. No intraperitoneal free gas. Complex gas and fluid collection along the mid sigmoid colon. Musculoskeletal: No abdominal wall hernia or abnormality. No suspicious lytic or blastic osseous lesions. No acute displaced fracture. Multilevel degenerative changes of the spine. IMPRESSION: 1. Findings likely represent complicated acute sigmoid diverticulitis with a 10 x 6 cm intramural abscess formation along the mid sigmoid colon. Query associated fistula with a small bowel loop in the right mid abdomen. Underlying malignancy is not excluded. Recommend surgical consultation. 2. Indeterminate 3.8 cm right adnexal fluid collection. Recommend follow-up [facility] 6-12 months. Note: This recommendation does not apply to premenarchal patients and to those with increased risk (genetic, family history, elevated tumor markers or other high-risk factors) of ovarian  cancer. Reference: JACR 2020 Feb; 17(2):248-254 These results were called by telephone at the time of interpretation on 06/20/2021 at 6:09 pm to [facility], who verbally acknowledged these results. Electronically Signed   By: MIven FinnM.D.   On: 06/20/2021 18:33   ? ?Anti-infectives: ?Anti-infectives (From admission, onward)  ? ? Start     Dose/Rate Route Frequency Ordered Stop  ? 06/21/21 0600  ceFEPIme (MAXIPIME) 2 g in sodium chloride 0.9 % 100 mL IVPB       ? 2 g ?200 mL/hr over 30 Minutes Intravenous Every 8 hours 06/20/21 2055    ? 06/20/21 2200  piperacillin-tazobactam (ZOSYN) IVPB 3.375 g  Status:  Discontinued       ? 3.375 g ?100 mL/hr over 30 Minutes Intravenous Every 8 hours 06/20/21 1816 06/20/21 2051  ? 06/20/21 2030  ceFEPIme (MAXIPIME) 2 g in sodium chloride 0.9 % 100 mL IVPB       ? 2 g ?200 mL/hr over 30 Minutes Intravenous  Once 06/20/21 2029 06/20/21 2240  ? 06/20/21 2030  metroNIDAZOLE (FLAGYL) IVPB 500 mg       ? 500 mg ?100 mL/hr over 60 Minutes Intravenous Every 12 hours 06/20/21 2029 06/27/21 2029  ? ?  ? ? ? ?Assessment/Plan ?Sigmoid diverticulitis with perforation, mass? ?- CT scan 4/15 with Findings likely represent complicated acute sigmoid diverticulitis with a 10 x 6 cm intramural abscess formation along  the mid sigmoid colon. Query associated fistula with a small bowel loop in the right mid abdomen. Underlying malignancy is not ?excluded. ?-  IR consult for drain placement ?- NPO/bowel rest and IV abx ?- hopefully she will improve with medical management +/- drain but should she worsen or fail to improve could need urgent sigmoid colectomy. She will ultimately need outpatient colonoscopy in 6-8 weeks ?- having dysuria - recent klebsiella UTI. UA negative on admit. On abx ? ?FEN: NPO, LR @ 125 ml/hr ?ID: zosyn 4/15, cefepime/flagyl 4/16> ?VTE: okay for chemical prophylaxis from surgical standpoint ? ?I reviewed hospitalist notes, last 24 h vitals and pain scores,  last 48 h intake and output, last 24 h labs and trends, and last 24 h imaging results. ? ? ? LOS: 1 day  ? ?Winferd Humphrey, PA-C ?[facility] ?06/21/2021, 8:55 AM ?Please see Amion for pager number

## 2021-06-22 DIAGNOSIS — K5792 Diverticulitis of intestine, part unspecified, without perforation or abscess without bleeding: Secondary | ICD-10-CM | POA: Diagnosis not present

## 2021-06-22 LAB — PROCALCITONIN: Procalcitonin: 0.2 ng/mL

## 2021-06-22 LAB — BASIC METABOLIC PANEL
Anion gap: 7 (ref 5–15)
BUN: 12 mg/dL (ref 8–23)
CO2: 24 mmol/L (ref 22–32)
Calcium: 8 mg/dL — ABNORMAL LOW (ref 8.9–10.3)
Chloride: 108 mmol/L (ref 98–111)
Creatinine, Ser: 0.57 mg/dL (ref 0.44–1.00)
GFR, Estimated: >60 mL/min (ref >60)
Glucose, Bld: 71 mg/dL (ref 70–99)
Potassium: 3 mmol/L — ABNORMAL LOW (ref 3.5–5.1)
Sodium: 139 mmol/L (ref 135–145)

## 2021-06-22 LAB — GLUCOSE, CAPILLARY
Glucose-Capillary: 60 mg/dL — ABNORMAL LOW (ref 70–99)
Glucose-Capillary: 63 mg/dL — ABNORMAL LOW (ref 70–99)
Glucose-Capillary: 51 mg/dL — ABNORMAL LOW (ref 70–99)

## 2021-06-22 LAB — CBC
HCT: 30.3 % — ABNORMAL LOW (ref 36.0–46.0)
Hemoglobin: 9.6 g/dL — ABNORMAL LOW (ref 12.0–15.0)
MCH: 31.7 pg (ref 26.0–34.0)
MCHC: 31.7 g/dL (ref 30.0–36.0)
MCV: 100 fL (ref 80.0–100.0)
Platelets: 245 10*3/uL (ref 150–400)
RBC: 3.03 MIL/uL — ABNORMAL LOW (ref 3.87–5.11)
RDW: 12.6 % (ref 11.5–15.5)
WBC: 6.4 10*3/uL (ref 4.0–10.5)
nRBC: 0 % (ref 0.0–0.2)

## 2021-06-22 MED ORDER — ENOXAPARIN SODIUM 40 MG/0.4ML IJ SOSY
40.0000 mg | PREFILLED_SYRINGE | INTRAMUSCULAR | Status: DC
  Administered 2021-06-22 – 2021-06-25 (×4): 40 mg via SUBCUTANEOUS
  Filled 2021-06-22 (×5): qty 0.4

## 2021-06-22 MED ORDER — SODIUM CHLORIDE 0.9 % IV SOLN
INTRAVENOUS | Status: DC
Start: 2021-06-22 — End: 2021-06-23

## 2021-06-22 MED ORDER — VANCOMYCIN HCL 750 MG/150ML IV SOLN: 750.0000 mg | INTRAVENOUS | Status: DC

## 2021-06-22 MED ORDER — VANCOMYCIN HCL IN DEXTROSE 1-5 GM/200ML-% IV SOLN
1000.0000 mg | Freq: Once | INTRAVENOUS | Status: AC
  Administered 2021-06-22: 1000 mg via INTRAVENOUS
  Filled 2021-06-22: qty 200

## 2021-06-22 MED ORDER — PIPERACILLIN-TAZOBACTAM 3.375 G IVPB
3.3750 g | Freq: Three times a day (TID) | INTRAVENOUS | Status: DC
  Administered 2021-06-22 – 2021-06-27 (×15): 3.375 g via INTRAVENOUS
  Filled 2021-06-22 (×15): qty 50

## 2021-06-22 MED ORDER — POTASSIUM CHLORIDE CRYS ER 20 MEQ PO TBCR
40.0000 meq | EXTENDED_RELEASE_TABLET | Freq: Four times a day (QID) | ORAL | Status: AC
  Administered 2021-06-22 (×2): 40 meq via ORAL
  Filled 2021-06-22 (×2): qty 2

## 2021-06-22 NOTE — Progress Notes (Signed)
Pharmacy Antibiotic Note ? ?Tracy Estrada is a 71 y.o. female admitted on 06/20/2021 with  intra-abdominal infection .  Pharmacy has been consulted for cefepime and vancomycin dosing.  ? ?Patient with abscess drain placed 4/16 by IR. Abscess culture growing GPC, GPR on gram stain. D/w Dr. Pietro Cassis, will start vancomycin empirically.  ? ?CrCl ~64 ml/min,  ? ?WBC no WNL, SCr wnl  ? ?Plan: ?Vancomycin '1000mg'$  IV x 1, then '750mg'$  IV q24h for eAUC 456, Scr used 0.8 (adjusted for age) ?Continue Cefepime 2 gm IV Q 8 hours ?Flagyl IV per MD  ?-Monitor CBC, renal fx, cultures and clinical progress ? ?Height: 5' (152.4 cm) ?IBW/kg (Calculated) : 45.5 ? ?Temp (24hrs), Avg:98.4 ?F (36.9 ?C), Min:97.7 ?F (36.5 ?C), Max:99.2 ?F (37.3 ?C) ? ?Recent Labs  ?Lab 06/20/21 ?1417 06/20/21 ?1418 06/20/21 ?1655 06/20/21 ?2137 06/21/21 ?0500 06/21/21 ?9937 06/22/21 ?0153  ?WBC 17.2*  --   --  11.9* 11.8*  --  6.4  ?CREATININE 0.67  --   --   --  0.55  --  0.57  ?LATICACIDVEN  --  1.7 1.6 1.0  --  0.9  --   ? ?  ?CrCl cannot be calculated (Unknown ideal weight.).   ? ?Allergies  ?Allergen Reactions  ? Demerol [Meperidine] Nausea And Vomiting  ? Kenalog [Triamcinolone Acetonide] Other (See Comments)  ?  Cause vertigo/dizziness  ? Adhesive [Tape] Rash and Other (See Comments)  ?  PAPER TAPE OK  ? ? ?Antimicrobials this admission: ?Cefepime 4/15 >>  ?Flagyl 4/15 >>  ?Vancomycin 4/17>> ? ?Dose adjustments this admission: ? ? ?Microbiology results: ?4/15 BCx:  ?4/16: Abscess>> GPC, GPR ? ?Jordani Nunn A. Levada Dy, PharmD, BCPS, FNKF ?Clinical Pharmacist ?[place] ?Please utilize Amion for appropriate phone number to reach the unit pharmacist ([place]) ? ? ? ?

## 2021-06-22 NOTE — Evaluation (Signed)
Occupational Therapy Evaluation ?Patient Details ?Name: Tracy Estrada ?MRN: [id number] ?DOB: 03-Dec-1950 ?Today's Date: 06/22/2021 ? ? ?History of Present Illness Pt is a 71 y.o. female admitted 4/15 with acute diverticulitis with abscess formation. Drain placement 4/16.  PMH:  HLD,OA, DDD, fibromyalgia syndrome, depression, anxiety, recurrent UTI  ? ?Clinical Impression ?  ?Pt was independent prior to admission. Present with generalized weakness and mild standing balance deficits. She has chronic shoulder pain and has plans for a R TSA. Pt overall requiring set up to min guard assist for ADLs and up to min assist for mobility. Likely to progress well and not require post acute OT. She lives with her supportive husband.  ?   ? ?Recommendations for follow up therapy are one component of a multi-disciplinary discharge planning process, led by the attending physician.  Recommendations may be updated based on patient status, additional functional criteria and insurance authorization.  ? ?Follow Up Recommendations ? No OT follow up  ?  ?Assistance Recommended at Discharge Set up Supervision/Assistance  ?Patient can return home with the following A little help with walking and/or transfers;A little help with bathing/dressing/bathroom;Assistance with cooking/housework;Assist for transportation;Help with stairs or ramp for entrance ? ?  ?Functional Status Assessment ? Patient has had a recent decline in their functional status and demonstrates the ability to make significant improvements in function in a reasonable and predictable amount of time.  ?Equipment Recommendations ? None recommended by OT  ?  ?Recommendations for Other Services   ? ? ?  ?Precautions / Restrictions Precautions ?Precautions: Fall;Other (comment) ?Precaution Comments: 2 syncopal episodes at home PTA, drain L lower abdomen  ? ?  ? ?Mobility Bed Mobility ?Overal bed mobility: Needs Assistance ?Bed Mobility: Sit to Sidelying ?  ?  ?  ?  ?Sit to sidelying:  Supervision ?General bed mobility comments: no assist to return to supine ?  ? ?Transfers ?Overall transfer level: Needs assistance ?Equipment used: 1 person hand held assist ?Transfers: Sit to/from Stand ?Sit to Stand: Min guard ?  ?  ?  ?  ?  ?  ?  ? ?  ?Balance Overall balance assessment: Mild deficits observed, not formally tested ?  ?  ?  ?  ?  ?  ?  ?  ?  ?  ?  ?  ?  ?  ?  ?  ?  ?  ?   ? ?ADL either performed or assessed with clinical judgement  ? ?ADL Overall ADL's : Needs assistance/impaired ?Eating/Feeding: NPO ?  ?Grooming: Standing;Min guard ?  ?Upper Body Bathing: Set up;Sitting ?  ?Lower Body Bathing: Sit to/from stand;Min guard ?  ?Upper Body Dressing : Set up;Sitting ?  ?Lower Body Dressing: Sit to/from stand;Min guard ?  ?Toilet Transfer: Ambulation;Minimal assistance ?  ?Toileting- Clothing Manipulation and Hygiene: Supervision/safety;Sit to/from stand ?  ?  ?  ?Functional mobility during ADLs: Minimal assistance ?   ? ? ? ?Vision Baseline Vision/History: 1 Wears glasses ?Ability to See in Adequate Light: 0 Adequate ?Patient Visual Report: No change from baseline ?   ?   ?Perception   ?  ?Praxis   ?  ? ?Pertinent Vitals/Pain Pain Assessment ?Pain Assessment: No/denies pain  ? ? ? ?Hand Dominance Right ?  ?Extremity/Trunk Assessment Upper Extremity Assessment ?Upper Extremity Assessment: B shoulder pain, plan for B TSA, R first ?  ?Lower Extremity Assessment ?Lower Extremity Assessment: Defer to PT evaluation ?  ?Cervical / Trunk Assessment ?Cervical / Trunk Assessment: Normal ?  ?Communication  Communication ?Communication: No difficulties ?  ?Cognition Arousal/Alertness: Awake/alert ?Behavior During Therapy: [facility] for tasks assessed/performed ?Overall Cognitive Status: Within Functional Limits for tasks assessed ?  ?  ?  ?  ?  ?  ?  ?  ?  ?  ?  ?  ?  ?  ?  ?  ?  ?  ?  ?General Comments    ? ?  ?Exercises   ?  ?Shoulder Instructions    ? ? ?Home Living Family/patient expects to be discharged to::  Private residence ?Living Arrangements: Spouse/significant other ?Available Help at Discharge: Family;Available 24 hours/day ?Type of Home: House ?Home Access: Stairs to enter ?Entrance Stairs-Number of Steps: 4 ?Entrance Stairs-Rails: Right;Left ?Home Layout: One level ?  ?  ?Bathroom Shower/Tub: Walk-in shower ?  ?Bathroom Toilet: Standard ?  ?  ?Home Equipment: Grab bars - tub/shower;Shower seat ?  ?Additional Comments: Pt has a very supportive husband who can assist as needed. ?  ? ?  ?Prior Functioning/Environment Prior Level of Function : Independent/Modified Independent;Driving ?  ?  ?  ?  ?  ?  ?  ?  ?  ? ?  ?  ?OT Problem List: Decreased strength;Impaired balance (sitting and/or standing);Decreased activity tolerance ?  ?   ?OT Treatment/Interventions: Self-care/ADL training  ?  ?OT Goals(Current goals can be found in the care plan section) Acute Rehab OT Goals ?OT Goal Formulation: With patient ?Time For Goal Achievement: 07/06/21 ?Potential to Achieve Goals: Good ?ADL Goals ?Pt Will Perform Grooming: Independently;standing ?Pt Will Perform Lower Body Bathing: Independently;sit to/from stand ?Pt Will Perform Lower Body Dressing: Independently;sit to/from stand ?Pt Will Transfer to Toilet: Independently;ambulating;regular height toilet ?Pt Will Perform Toileting - Clothing Manipulation and hygiene: Independently;sit to/from stand ?Additional ADL Goal #1: Pt will gather items for ADLs around room independently.  ?OT Frequency: Min 2X/week ?  ? ?Co-evaluation   ?  ?  ?  ?  ? ?  ?AM-PAC OT "6 Clicks" Daily Activity     ?Outcome Measure   ?Help from another person taking care of personal grooming?: A Little ?Help from another person toileting, which includes using toliet, bedpan, or urinal?: A Little ?Help from another person bathing (including washing, rinsing, drying)?: A Little ?  ?Help from another person to put on and taking off regular lower body clothing?: A Little ?6 Click Score: 12 ?  ?End of Session  Nurse Communication: Other (comment) (pt with questions about IV antibiotics) ? ?Activity Tolerance: Patient tolerated treatment well ?Patient left: in bed;with call bell/phone within reach;with family/visitor present ? ?OT Visit Diagnosis: Muscle weakness (generalized) (M62.81)  ?              ?Time: 1962-2297 ?OT Time Calculation (min): 16 min ?Charges:  OT General Charges ?$OT Visit: 1 Visit ?OT Evaluation ?$OT Eval Low Complexity: 1 Low ? ?Nestor Lewandowsky, OTR/L ?Acute Rehabilitation Services ?Pager: [phone] ?Office: [phone]  ?Tracy Estrada ?06/22/2021, 2:01 PM ?

## 2021-06-22 NOTE — Evaluation (Signed)
Physical Therapy Evaluation ?Patient Details ?Name: Tracy Estrada ?MRN: [id number] ?DOB: [birth year] ?Today's Date: 06/22/2021 ? ?History of Present Illness ? Pt is a 71 y.o. female admitted 4/15 with acute diverticulitis with abscess formation. Drain placement 4/16.  PMH:  HLD,OA, DDD, fibromyalgia syndrome, depression, anxiety, recurrent UTI ?  ?Clinical Impression ? Pt admitted with above diagnosis. PTA pt independent living at home with husband. Pt currently with functional limitations due to the deficits listed below (see PT Problem List). On eval, pt required min assist bed mobility, min assist transfers, and min/HHA ambulation 75'. Deficits noted in general strength and activity tolerance. Abdominal pain is also limiting her mobility. Pt will benefit from skilled PT to increase their independence and safety with mobility to allow discharge home. PT to follow acutely. No follow up services indicated. Pt has very supportive husband who can provide needed level of assist.   ?   ?   ? ?Recommendations for follow up therapy are one component of a multi-disciplinary discharge planning process, led by the attending physician.  Recommendations may be updated based on patient status, additional functional criteria and insurance authorization. ? ?Follow Up Recommendations No PT follow up ? ?  ?Assistance Recommended at Discharge PRN  ?Patient can return home with the following ?   ? ?  ?Equipment Recommendations None recommended by PT  ?Recommendations for Other Services ?    ?  ?Functional Status Assessment    ? ?  ?Precautions / Restrictions Precautions ?Precautions: Fall;Other (comment) ?Precaution Comments: 2 syncopal episodes at home PTA, drain L lower abdomen  ? ?  ? ?Mobility ? Bed Mobility ?Overal bed mobility: Needs Assistance ?Bed Mobility: Supine to Sit ?  ?  ?Supine to sit: Min assist ?  ?  ?General bed mobility comments: +rail, assist to elevate trunk ?  ? ?Transfers ?Overall transfer level: Needs  assistance ?Equipment used: Ambulation equipment used ?Transfers: Sit to/from Stand ?Sit to Stand: Min guard ?  ?  ?  ?  ?  ?  ?  ? ?Ambulation/Gait ?Ambulation/Gait assistance: Min assist ?Gait Distance (Feet): 75 Feet ?Assistive device: IV Pole, 1 person hand held assist ?Gait Pattern/deviations: Step-through pattern, Decreased stride length ?Gait velocity: decreased ?Gait velocity interpretation: <1.31 ft/sec, indicative of household ambulator ?  ?General Gait Details: Slow, steady gait with HHA on R and IV pole support on L. Returned to room due to pt with c/o feeling lightheaded. BP seated in recliner 107/56. ? ?Stairs ?  ?  ?  ?  ?  ? ?Wheelchair Mobility ?  ? ?Modified Rankin (Stroke Patients Only) ?  ? ?  ? ?Balance Overall balance assessment: Mild deficits observed, not formally tested ?  ?  ?  ?  ?  ?  ?  ?  ?  ?  ?  ?  ?  ?  ?  ?  ?  ?  ?   ? ? ? ?Pertinent Vitals/Pain Pain Assessment ?Pain Assessment: Faces ?Faces Pain Scale: Hurts even more ?Pain Location: abdomen/drain site ?Pain Descriptors / Indicators: Grimacing, Guarding, Discomfort, Sore ?Pain Intervention(s): Monitored during session, Limited activity within patient's tolerance, Repositioned  ? ? ?Home Living Family/patient expects to be discharged to:: Private residence ?Living Arrangements: Spouse/significant other ?Available Help at Discharge: Family;Available 24 hours/day ?Type of Home: House ?Home Access: Stairs to enter ?Entrance Stairs-Rails: Right;Left ?Entrance Stairs-Number of Steps: 4 ?  ?Home Layout: One level ?Home Equipment: Grab bars - tub/shower;Shower seat ?Additional Comments: Pt has a very supportive husband who  can assist as needed.  ?  ?Prior Function Prior Level of Function : Independent/Modified Independent;Driving ?  ?  ?  ?  ?  ?  ?  ?  ?  ? ? ?Hand Dominance  ?   ? ?  ?Extremity/Trunk Assessment  ? Upper Extremity Assessment ?Upper Extremity Assessment: Generalized weakness ?  ? ?Lower Extremity Assessment ?Lower  Extremity Assessment: Generalized weakness ?  ? ?Cervical / Trunk Assessment ?Cervical / Trunk Assessment: Normal  ?Communication  ? Communication: No difficulties  ?Cognition Arousal/Alertness: Awake/alert ?Behavior During Therapy: [facility] for tasks assessed/performed ?Overall Cognitive Status: Within Functional Limits for tasks assessed ?  ?  ?  ?  ?  ?  ?  ?  ?  ?  ?  ?  ?  ?  ?  ?  ?  ?  ?  ? ?  ?General Comments   ? ?  ?Exercises    ? ?Assessment/Plan  ?  ?PT Assessment Patient needs continued PT services  ?PT Problem List Decreased strength;Decreased mobility;Decreased activity tolerance;Pain ? ?   ?  ?PT Treatment Interventions Therapeutic activities;Gait training;Therapeutic exercise;Patient/family education;Balance training;Stair training;Functional mobility training   ? ?PT Goals (Current goals can be found in the Care Plan section)  ?Acute Rehab PT Goals ?Patient Stated Goal: home ?PT Goal Formulation: With patient ?Time For Goal Achievement: 07/06/21 ?Potential to Achieve Goals: Good ? ?  ?Frequency Min 3X/week ?  ? ? ?Co-evaluation   ?  ?  ?  ?  ? ? ?  ?AM-PAC PT "6 Clicks" Mobility  ?Outcome Measure Help needed turning from your back to your side while in a flat bed without using bedrails?: None ?Help needed moving from lying on your back to sitting on the side of a flat bed without using bedrails?: A Little ?Help needed moving to and from a bed to a chair (including a wheelchair)?: A Little ?Help needed standing up from a chair using your arms (e.g., wheelchair or bedside chair)?: A Little ?Help needed to walk in hospital room?: A Little ?Help needed climbing 3-5 steps with a railing? : A Little ?6 Click Score: 19 ? ?  ?End of Session Equipment Utilized During Treatment: Gait belt ?Activity Tolerance: Patient tolerated treatment well ?Patient left: in chair;with call bell/phone within reach;with family/visitor present ?Nurse Communication: Mobility status ?PT Visit Diagnosis: Muscle weakness (generalized)  (M62.81);Unsteadiness on feet (R26.81);Pain ?  ? ?Time: 2800-3491 ?PT Time Calculation (min) (ACUTE ONLY): 19 min ? ? ?Charges:   PT Evaluation ?$PT Eval Moderate Complexity: 1 Mod ?  ?  ?   ? ? ?Lorrin Goodell, PT  ?Office # [phone] ?Pager [phone] ? ? ?Lorriane Shire ?06/22/2021, 12:26 PM ? ?

## 2021-06-22 NOTE — Progress Notes (Signed)
? ?Subjective/Chief Complaint: ?Still sore, drainin yesterday, having flatus ? ? ?Objective: ?Vital signs in last 24 hours: ?Temp:  [97.6 ?F (36.4 ?C)-99.2 ?F (37.3 ?C)] 97.6 ?F (36.4 ?C) (04/17 0940) ?Pulse Rate:  [73-97] 77 (04/17 0940) ?Resp:  [16-25] 17 (04/17 0940) ?BP: (91-121)/(49-64) 104/55 (04/17 0940) ?SpO2:  [93 %-100 %] 93 % (04/17 0940) ?  ? ?Intake/Output from previous day: ?04/16 0701 - 04/17 0700 ?In: 2011 [I.V.:1500; IV Piggyback:511.1] ?Out: 170 [Drains:170] ?Intake/Output this shift: ?No intake/output data recorded. ? ?Abd soft drain with serosang output, mildly tender throughout ? ?Lab Results:  ?Recent Labs  ?  06/21/21 ?0500 06/22/21 ?0153  ?WBC 11.8* 6.4  ?HGB 10.4* 9.6*  ?HCT 31.9* 30.3*  ?PLT 272 245  ? ?BMET ?Recent Labs  ?  06/21/21 ?0500 06/22/21 ?0153  ?NA 136 139  ?K 3.8 3.0*  ?CL 100 108  ?CO2 28 24  ?GLUCOSE 112* 71  ?BUN 11 12  ?CREATININE 0.55 0.57  ?CALCIUM 8.3* 8.0*  ? ?PT/INR ?Recent Labs  ?  06/20/21 ?1417  ?LABPROT 15.6*  ?INR 1.3*  ? ?ABG ?No results for input(s): PHART, HCO3 in the last 72 hours. ? ?Invalid input(s): PCO2, PO2 ? ?Studies/Results: ?DG Chest 2 View ? ?Result Date: 06/20/2021 ?CLINICAL DATA:  Sepsis EXAM: CHEST - 2 VIEW COMPARISON:  Chest x-ray 11/11/2005 FINDINGS: Heart size and mediastinal contours are within normal limits. No suspicious pulmonary opacities identified. No pleural effusion or pneumothorax visualized. No acute osseous abnormality appreciated. IMPRESSION: No acute intrathoracic process identified. Electronically Signed   By: Ofilia Neas M.D.   On: 06/20/2021 15:22  ? ?CT ABDOMEN PELVIS W CONTRAST ? ?Result Date: 06/20/2021 ?CLINICAL DATA:  Abdominal pain, acute, nonlocalized EXAM: CT ABDOMEN AND PELVIS WITH CONTRAST TECHNIQUE: Multidetector CT imaging of the abdomen and pelvis was performed using the standard protocol following bolus administration of intravenous contrast. RADIATION DOSE REDUCTION: This exam was performed according to the  departmental dose-optimization program which includes automated exposure control, adjustment of the mA and/or kV according to patient size and/or use of iterative reconstruction technique. CONTRAST:  158m OMNIPAQUE IOHEXOL 300 MG/ML  SOLN COMPARISON:  CT abdomen 11/16/2005 FINDINGS: Lower chest: No acute abnormality. Hepatobiliary: No focal liver abnormality. No gallstones, gallbladder wall thickening, or pericholecystic fluid. No biliary dilatation. Pancreas: No focal lesion. Normal pancreatic contour. No surrounding inflammatory changes. No main pancreatic ductal dilatation. Normal in size without focal abnormality. Spleen: Normal in size without focal abnormality. Adrenals/Urinary Tract: No adrenal nodule bilaterally. Bilateral kidneys enhance symmetrically. No hydronephrosis. No hydroureter. The urinary bladder is unremarkable. Fat stranding of the fat plane within the sigmoid colon and urinary bladder likely reactive with no definite fistula formation to the urinary bladder. On delayed imaging, there is no urothelial wall thickening and there are no filling defects in the opacified portions of the bilateral collecting systems or ureters. Stomach/Bowel: Stomach is within normal limits. Second portion of the duodenum diverticula. Scattered colonic diverticulosis. Irregular bowel wall thickening of the sigmoid colon with associated likely intramural abscess formation (3: 36-58). Associated perisigmoid fat stranding. Small of bowel appears to be inseparable from this complex collection within the lower abdomen/pelvis (7:16, 3:37). Appendix appears normal. Vascular/Lymphatic: No abdominal aorta or iliac aneurysm. No abdominal, pelvic, or inguinal lymphadenopathy. Reproductive: Indeterminate 3.8 cm right adnexal fluid collection. Status post hysterectomy. Other: Trace simple free fluid. No intraperitoneal free gas. Complex gas and fluid collection along the mid sigmoid colon. Musculoskeletal: No abdominal wall  hernia or abnormality. No suspicious lytic or  blastic osseous lesions. No acute displaced fracture. Multilevel degenerative changes of the spine. IMPRESSION: 1. Findings likely represent complicated acute sigmoid diverticulitis with a 10 x 6 cm intramural abscess formation along the mid sigmoid colon. Query associated fistula with a small bowel loop in the right mid abdomen. Underlying malignancy is not excluded. Recommend surgical consultation. 2. Indeterminate 3.8 cm right adnexal fluid collection. Recommend follow-up US in 6-12 months. Note: This recommendation does not apply to premenarchal patients and to those with increased risk (genetic, family history, elevated tumor markers or other high-risk factors) of ovarian cancer. Reference: JACR 2020 Feb; 17(2):248-254 These results were called by telephone at the time of interpretation on 06/20/2021 at 6:09 pm to provider [facility] , who verbally acknowledged these results. Electronically Signed   By: Iven Finn M.D.   On: 06/20/2021 18:33  ? ?CT IMAGE GUIDED DRAINAGE BY PERCUTANEOUS CATHETER ? ?Result Date: 06/21/2021 ?INDICATION: Perforated anterior pelvic diverticular abscess EXAM: CT DRAINAGE OF THE ANTERIOR PELVIC DIVERTICULAR ABSCESS MEDICATIONS: The patient is currently admitted to the hospital and receiving intravenous antibiotics. The antibiotics were administered within an appropriate time frame prior to the initiation of the procedure. ANESTHESIA/SEDATION: Moderate (conscious) sedation was employed during this procedure. A total of Versed 1.5 mg and Fentanyl 50 mcg was administered intravenously by the radiology nurse. Total intra-service moderate Sedation Time: 19 minutes. The patient's level of consciousness and vital signs were monitored continuously by radiology nursing throughout the procedure under my direct supervision. COMPLICATIONS: NONE. PROCEDURE: Informed written consent was obtained from the patient after a thorough discussion of  the procedural risks, benefits and alternatives. All questions were addressed. Maximal Sterile Barrier Technique was utilized including caps, mask, sterile gowns, sterile gloves, sterile drape, hand hygiene and skin antiseptic. A timeout was performed prior to the initiation of the procedure. previous imaging reviewed. patient positioned supine. noncontrast localization ct performed. the large midline anterior pelvic complex diverticular abscess was localized and marked for a left lower quadrant anterior approach. Under sterile conditions and local anesthesia, an 18 gauge 10 cm access needle was advanced from a left lower quadrant anterior oblique approach into the air-fluid collection. Needle position confirmed with CT. Amplatz guidewire advanced within the large air-fluid collection. Tract dilatation performed to insert a 12 [place] drain. Drain catheter position confirmed with CT. Syringe aspiration yielded 20 cc bloody fecal contaminated fluid. Sample sent for culture. This completely decompressed the air-fluid collection. Catheter secured with a Prolene suture and connected to external suction bulb. Sterile dressing applied. No immediate complication. Patient tolerated the procedure well. IMPRESSION: Successful CT-guided anterior pelvic diverticular abscess drain placement. Electronically Signed   By: Jerilynn Mages.  Shick M.D.   On: 06/21/2021 13:53   ? ?Anti-infectives: ?Anti-infectives (From admission, onward)  ? ? Start     Dose/Rate Route Frequency Ordered Stop  ? 06/23/21 1000  vancomycin (VANCOREADY) IVPB 750 mg/150 mL       ? 750 mg ?150 mL/hr over 60 Minutes Intravenous Every 24 hours 06/22/21 0827    ? 06/22/21 0915  vancomycin (VANCOCIN) IVPB 1000 mg/200 mL premix       ? 1,000 mg ?200 mL/hr over 60 Minutes Intravenous  Once 06/22/21 0827    ? 06/21/21 0600  ceFEPIme (MAXIPIME) 2 g in sodium chloride 0.9 % 100 mL IVPB       ? 2 g ?200 mL/hr over 30 Minutes Intravenous Every 8 hours 06/20/21 2055    ? 06/20/21  2200  piperacillin-tazobactam (ZOSYN) IVPB 3.375 g  Status:  Discontinued       ? 3.375 g ?100 mL/hr over 30 Minutes Intravenous Every 8 hours 06/20/21 1816 06/20/21 2051  ? 06/20/21 2030  ceFEPIme (MAXIPIME) 2

## 2021-06-22 NOTE — Plan of Care (Signed)

## 2021-06-22 NOTE — Progress Notes (Signed)
?PROGRESS NOTE ? ?Tracy Estrada  ?DOB: 1950/05/07  ?PCP: [name], NP ?DXA:[id number]  ?DOA: 06/20/2021 ? LOS: 2 days  ?Hospital Day: 3 ? ?Brief narrative: ?Tracy Estrada is a 71 y.o. female with PMH significant for HLD,osteoarthritis, degenerative disc disease and fibromyalgia syndrome, depression, anxiety, as well as recurrent UTI.  ?On 4/10, patient went to urologist, complained of left-sided abdominal pain and was treated with ciprofloxacin.  Urine culture sent at that time grew Klebsiella.  Patient was taking ciprofloxacin as an outpatient.   ?Patient came to the ED on 4/15 with persistent left-sided abdominal pain ?CT scan of the abdomen pelvis at that time showed sigmoid diverticulitis with 10 cm x 6 cm intramural abscess along the mid sigmoid colon.  ?General surgery was consulted.   ?Admitted to hospitalist service ?4/16, patient underwent abscess drainage by IR.  Bloody fecal contaminated purulent fluid was obtained. ? ?Subjective: ?Patient was seen and examined this morning.  Pleasant elderly Caucasian female.  Propped up in bed.  Not in distress.  Pain controlled.  Husband at bedside. ?Blood pressure this morning was low at 91/51. ?Abscess cultures growing gram-positive cocci. ? ?Principal Problem: ?  Acute diverticulitis ?Active Problems: ?  Intra-abdominal abscess ([place]) ?  Right pelvic adnexal fluid collection ?  Normocytic anemia ?  Anxiety and depression ?  Chronic right shoulder pain ?  ? ?Assessment and Plan: ?Acute sigmoid diverticulitis ?Intra-abdominal abscess ?-Presented with abdominal pain  ?-CT abdomen and pelvis imaging as above showing sigmoid diverticulitis with abscess ?-General surgery and ID consult appreciated.   ?-4/16, patient underwent abscess drainage by IR. ?-Aspirated contents grew gram-positive cocci.  Discussed with pharmacy.  Antibiotic streamlined to IV Zosyn. ?-General surgery following.  Remains NPO. ?Recent Labs  ?Lab 06/20/21 ?1417 06/20/21 ?1418 06/20/21 ?1655  06/20/21 ?2137 06/21/21 ?0500 06/21/21 ?2094 06/22/21 ?0153  ?WBC 17.2*  --   --  11.9* 11.8*  --  6.4  ?LATICACIDVEN  --  1.7 1.6 1.0  --  0.9  --   ?PROCALCITON  --   --   --  0.21 0.28  --  0.20  ?  ?Right pelvic adnexal fluid collection ?-Her CT abdomen from 4/15, there is an indeterminate 3.8 cm right adnexal fluid collection.  Radiologist recommended follow-up US in 6-12 months. ? ?Hypokalemia ?-Potassium low at 3 this morning.  Due to n.p.o. status.  Replacement given. ?Recent Labs  ?Lab 06/20/21 ?1417 06/21/21 ?0500 06/22/21 ?0153  ?K 3.7 3.8 3.0*  ? ? ?  ?Acute anemia ?-No acute blood loss.  However hemoglobin gradually downtrending, 9.6 today.  Adequate ferritin, folate and vitamin B12 level.  Continue to monitor. ?Recent Labs  ?  09/09/20 ?1023 05/01/21 ?1330 05/01/21 ?1330 06/20/21 ?1417 06/20/21 ?2137 06/21/21 ?0500 06/21/21 ?7096 06/22/21 ?0153  ?HGB  --  13.5  --  12.6 11.0* 10.4*  --  9.6*  ?MCV  --  97.6   < > 98.2 98.8 98.2  --  100.0  ?[id number]  --   --   --   --   --   --  562  --   ?FOLATE  --   --   --   --   --   --  16.1  --   ?FERRITIN 347*  --   --   --   --   --  477*  --   ?TIBC 310  --   --   --   --   --  209*  --   ?  IRON 108  --   --   --   --   --  12*  --   ?RETICCTPCT  --   --   --   --   --   --  1.0  --   ? < > = values in this interval not displayed.  ? ?Anxiety and depression ?-Continue Wellbutrin and BuSpar ?  ?Chronic right shoulder pain ?-Outpatient follow-up.  Patient states she needs surgery. ? ?Goals of care ?  Code Status: Full Code  ? ? ?Mobility: Encourage ambulation ? ?Skin assessment:  ?  ? ?Nutritional status:  ?Body mass index is 23.28 kg/m?.  ?  ?  ? ? ? ? ?Diet:  ?Diet Order   ? ?       ?  Diet NPO time specified  Diet effective now       ?  ? ?  ?  ? ?  ? ? ?DVT prophylaxis:  ?enoxaparin (LOVENOX) injection 40 mg Start: 06/22/21 0915 ?SCDs Start: 06/20/21 2022 ?  ?Antimicrobials: IV Zosyn ?Fluid: None currently.  As patient is n.p.o., will start on NS at 75  mill per hour. ?Consultants: General surgery ?Family Communication: Husband at bedside ? ?Status is: Inpatient ? ?Continue in-hospital care because: IV antibiotics for acute diverticulitis ?Level of care: Progressive  ? ?Dispo: The patient is from: Home ?             Anticipated d/c is to: Home ?             Patient currently is not medically stable to d/c. ?  Difficult to place patient No ? ? ? ? ?Infusions:  ? sodium chloride    ? piperacillin-tazobactam (ZOSYN)  IV    ? ? ?Scheduled Meds: ? buPROPion  300 mg Oral Daily  ? busPIRone  5 mg Oral BID  ? enoxaparin (LOVENOX) injection  40 mg Subcutaneous Q24H  ? potassium chloride  40 mEq Oral Q6H  ? sodium chloride flush  5 mL Intracatheter Q8H  ? ? ?PRN meds: ?acetaminophen **OR** acetaminophen, albuterol, HYDROcodone-acetaminophen, ondansetron **OR** ondansetron (ZOFRAN) IV  ? ?Antimicrobials: ?Anti-infectives (From admission, onward)  ? ? Start     Dose/Rate Route Frequency Ordered Stop  ? 06/23/21 1000  vancomycin (VANCOREADY) IVPB 750 mg/150 mL  Status:  Discontinued       ? 750 mg ?150 mL/hr over 60 Minutes Intravenous Every 24 hours 06/22/21 0827 06/22/21 1404  ? 06/22/21 1500  piperacillin-tazobactam (ZOSYN) IVPB 3.375 g       ? 3.375 g ?12.5 mL/hr over 240 Minutes Intravenous Every 8 hours 06/22/21 1404    ? 06/22/21 0915  vancomycin (VANCOCIN) IVPB 1000 mg/200 mL premix       ? 1,000 mg ?200 mL/hr over 60 Minutes Intravenous  Once 06/22/21 0827 06/22/21 1125  ? 06/21/21 0600  ceFEPIme (MAXIPIME) 2 g in sodium chloride 0.9 % 100 mL IVPB  Status:  Discontinued       ? 2 g ?200 mL/hr over 30 Minutes Intravenous Every 8 hours 06/20/21 2055 06/22/21 1404  ? 06/20/21 2200  piperacillin-tazobactam (ZOSYN) IVPB 3.375 g  Status:  Discontinued       ? 3.375 g ?100 mL/hr over 30 Minutes Intravenous Every 8 hours 06/20/21 1816 06/20/21 2051  ? 06/20/21 2030  ceFEPIme (MAXIPIME) 2 g in sodium chloride 0.9 % 100 mL IVPB       ? 2 g ?200 mL/hr over 30 Minutes Intravenous   Once 06/20/21  2029 06/20/21 2240  ? 06/20/21 2030  metroNIDAZOLE (FLAGYL) IVPB 500 mg  Status:  Discontinued       ? 500 mg ?100 mL/hr over 60 Minutes Intravenous Every 12 hours 06/20/21 2029 06/22/21 1404  ? ?  ? ? ?Objective: ?Vitals:  ? 06/22/21 0115 06/22/21 0940  ?BP: (!) 91/51 (!) 104/55  ?Pulse: 73 77  ?Resp: 16 17  ?Temp: 97.7 ?F (36.5 ?C) 97.6 ?F (36.4 ?C)  ?SpO2: 94% 93%  ? ? ?Intake/Output Summary (Last 24 hours) at 06/22/2021 1425 ?Last data filed at 06/22/2021 0600 ?Gross per 24 hour  ?Intake 1911.26 ml  ?Output 160 ml  ?Net 1751.26 ml  ? ?There were no vitals filed for this visit. ?Weight change:  ?Body mass index is 23.28 kg/m?.  ? ?Physical Exam: ?General exam: Pleasant, elderly Caucasian female. ?Skin: No rashes, lesions or ulcers. ?HEENT: Atraumatic, normocephalic, no obvious bleeding ?Lungs: Clear to auscultation bilaterally ?CVS: Regular rate and rhythm, no murmur ?GI/Abd soft, nontender, nondistended, bowel sound present ?CNS: Alert, awake, oriented x3 ?Psychiatry: Mood appropriate ?Extremities: No pedal, no calf tenderness ? ?Data Review: I have personally reviewed the laboratory data and studies available. ? ?F/u labs ordered ?Unresulted Labs (From admission, onward)  ? ?  Start     Ordered  ? 06/22/21 7425  Basic metabolic panel  Daily,   R     ? 06/21/21 0841  ? 06/22/21 0500  CBC  Daily,   R     ? 06/21/21 0841  ? 06/20/21 2025  Occult blood card to lab, stool  Once,   R       ? 06/20/21 2029  ? ?  ?  ? ?  ? ? ?Signed, ?Terrilee Croak, MD ?[facility] ?06/22/2021 ? ? ? ? ? ? ? ? ? ? ? ? ?

## 2021-06-23 DIAGNOSIS — K5792 Diverticulitis of intestine, part unspecified, without perforation or abscess without bleeding: Secondary | ICD-10-CM | POA: Diagnosis not present

## 2021-06-23 LAB — BASIC METABOLIC PANEL
Anion gap: 10 (ref 5–15)
BUN: 8 mg/dL (ref 8–23)
CO2: 23 mmol/L (ref 22–32)
Calcium: 7.9 mg/dL — ABNORMAL LOW (ref 8.9–10.3)
Chloride: 106 mmol/L (ref 98–111)
Creatinine, Ser: 0.55 mg/dL (ref 0.44–1.00)
GFR, Estimated: >60 mL/min (ref >60)
Glucose, Bld: 79 mg/dL (ref 70–99)
Potassium: 3.9 mmol/L (ref 3.5–5.1)
Sodium: 139 mmol/L (ref 135–145)

## 2021-06-23 LAB — CBC
HCT: 28.5 % — ABNORMAL LOW (ref 36.0–46.0)
Hemoglobin: 9.7 g/dL — ABNORMAL LOW (ref 12.0–15.0)
MCH: 33.1 pg (ref 26.0–34.0)
MCHC: 34 g/dL (ref 30.0–36.0)
MCV: 97.3 fL (ref 80.0–100.0)
Platelets: 286 10*3/uL (ref 150–400)
RBC: 2.93 MIL/uL — ABNORMAL LOW (ref 3.87–5.11)
RDW: 12.5 % (ref 11.5–15.5)
WBC: 4.2 10*3/uL (ref 4.0–10.5)
nRBC: 0 % (ref 0.0–0.2)

## 2021-06-23 LAB — GLUCOSE, CAPILLARY: Glucose-Capillary: 88 mg/dL (ref 70–99)

## 2021-06-23 MED ORDER — MORPHINE SULFATE (PF) 2 MG/ML IV SOLN: 2.0000 mg | INTRAVENOUS | Status: DC | PRN

## 2021-06-23 MED ORDER — DEXTROSE-NACL 5-0.9 % IV SOLN: INTRAVENOUS | Status: DC

## 2021-06-23 NOTE — Progress Notes (Signed)
Mobility Specialist Progress Note  ? ? 06/23/21 1053  ?Mobility  ?Activity Ambulated with assistance in hallway  ?Level of Assistance Contact guard assist, steadying assist  ?Assistive Device Other (Comment) ?(IV pole, HHA)  ?Distance Ambulated (ft) 80 ft  ?Activity Response Tolerated well  ?$Mobility charge 1 Mobility  ? ?Pt received in bed and agreeable. No complaints on walk. Returned to bed with call bell in reach.   ? ?Hildred Alamin ?Mobility Specialist  ?Primary: 5N M.S. Phone: [phone] ?Secondary: 6N M.S. Phone: [phone] ?  ?

## 2021-06-23 NOTE — Progress Notes (Signed)
?PROGRESS NOTE ? ?Tracy Estrada  ?DOB: May 06, 1950  ?PCP: [name], NP ?FUX:[id number]  ?DOA: 06/20/2021 ? LOS: 3 days  ?Hospital Day: 4 ? ?Brief narrative: ?Tracy Estrada is a 71 y.o. female with PMH significant for HLD,osteoarthritis, degenerative disc disease and fibromyalgia syndrome, depression, anxiety, as well as recurrent UTI.  ?On 4/10, patient went to urologist, complained of left-sided abdominal pain and was treated with ciprofloxacin.  Urine culture sent at that time grew Klebsiella.  Patient was taking ciprofloxacin as an outpatient.   ?Patient came to the ED on 4/15 with persistent left-sided abdominal pain ?CT scan of the abdomen pelvis at that time showed sigmoid diverticulitis with 10 cm x 6 cm intramural abscess along the mid sigmoid colon.  ?General surgery was consulted.   ?Admitted to hospitalist service ?4/16, patient underwent abscess drainage by IR.  Bloody fecal contaminated purulent fluid was obtained. ? ?Subjective: ?Patient was seen and examined this morning.   ?Lying on bed.  Not in distress.  Daughter at bedside. ?Passing flatus.  No bowel movement yet.  Afebrile. ? ? ?Principal Problem: ?  Acute diverticulitis ?Active Problems: ?  Intra-abdominal abscess ([place]) ?  Right pelvic adnexal fluid collection ?  Normocytic anemia ?  Anxiety and depression ?  Chronic right shoulder pain ?  ? ?Assessment and Plan: ?Acute sigmoid diverticulitis ?Intra-abdominal abscess ?-Presented with abdominal pain  ?-CT abdomen and pelvis imaging as above showing sigmoid diverticulitis with 10 cm X 6 cm intramural abscess. ?-General surgery and ID consult appreciated.   ?-4/16, patient underwent abscess drainage by IR. ?-Aspirated contents grew gram-positive cocci.  Discussed with pharmacy.  Antibiotic streamlined to IV Zosyn. ?-General surgery following.  Noted general surgery okay for clear liquids today. ?Recent Labs  ?Lab 06/20/21 ?1417 06/20/21 ?1418 06/20/21 ?1655 06/20/21 ?2137 06/21/21 ?0500  06/21/21 ?0845 06/22/21 ?0153 06/23/21 ?0100  ?WBC 17.2*  --   --  11.9* 11.8*  --  6.4 4.2  ?LATICACIDVEN  --  1.7 1.6 1.0  --  0.9  --   --   ?PROCALCITON  --   --   --  0.21 0.28  --  0.20  --   ?  ?Right pelvic adnexal fluid collection ?-Her CT abdomen from 4/15, there is an indeterminate 3.8 cm right adnexal fluid collection.  Radiologist recommended follow-up US in 6-12 months. ? ?Hypokalemia ?-Potassium level improved after replacement given yesterday ?Recent Labs  ?Lab 06/20/21 ?1417 06/21/21 ?0500 06/22/21 ?0153 06/23/21 ?0100  ?K 3.7 3.8 3.0* 3.9  ? ?Acute anemia ?-No acute blood loss.  Hemoglobin level slightly lower than usual..  Between 9 and 10. Adequate ferritin, folate and vitamin B12 level.  Continue to monitor. ?Recent Labs  ?  09/09/20 ?1023 05/01/21 ?1330 06/20/21 ?1417 06/20/21 ?2137 06/21/21 ?0500 06/21/21 ?0845 06/22/21 ?0153 06/23/21 ?0100  ?HGB  --    < > 12.6 11.0* 10.4*  --  9.6* 9.7*  ?MCV  --    < > 98.2 98.8 98.2  --  100.0 97.3  ?[id number]  --   --   --   --   --  562  --   --   ?FOLATE  --   --   --   --   --  16.1  --   --   ?FERRITIN 347*  --   --   --   --  477*  --   --   ?TIBC 310  --   --   --   --  209*  --   --   ?IRON 108  --   --   --   --  12*  --   --   ?RETICCTPCT  --   --   --   --   --  1.0  --   --   ? < > = values in this interval not displayed.  ? ?Hypoglycemia ?-No history of diabetes mellitus.  Not on insulin.  Glucose level however is running as low as 51. ?-Switch NS to D5NS today. ?Recent Labs  ?Lab 06/22/21 ?0940 06/22/21 ?7412 06/22/21 ?1634 06/23/21 ?0601  ?GLUCAP 63* 60* 51* 88  ? ?Anxiety and depression ?-Continue Wellbutrin and BuSpar ?  ?Chronic right shoulder pain ?-Outpatient follow-up.  Patient states she needs surgery. ? ?Goals of care ?  Code Status: Full Code  ? ? ?Mobility: Encourage ambulation ? ?Skin assessment:  ?  ? ?Nutritional status:  ?Body mass index is 23.28 kg/m?.  ?  ?  ? ? ? ? ?Diet:  ?Diet Order   ? ?       ?  Diet clear liquid Room  service appropriate? Yes; Fluid consistency: Thin  Diet effective now       ?  ? ?  ?  ? ?  ? ? ?DVT prophylaxis:  ?enoxaparin (LOVENOX) injection 40 mg Start: 06/22/21 0915 ?SCDs Start: 06/20/21 2022 ?  ?Antimicrobials: IV Zosyn ?Fluid: None currently.  As patient is n.p.o., will start on NS at 75 mill per hour. ?Consultants: General surgery ?Family Communication: Husband at bedside ? ?Status is: Inpatient ? ?Continue in-hospital care because: IV antibiotics for acute diverticulitis ?Level of care: Progressive  ? ?Dispo: The patient is from: Home ?             Anticipated d/c is to: Home ?             Patient currently is not medically stable to d/c. ?Difficult to place patient No ? ? ? ? ?Infusions:  ? dextrose 5 % and 0.9% NaCl 75 mL/hr at 06/23/21 1131  ? piperacillin-tazobactam (ZOSYN)  IV 3.375 g (06/23/21 8786)  ? ? ?Scheduled Meds: ? buPROPion  300 mg Oral Daily  ? busPIRone  5 mg Oral BID  ? enoxaparin (LOVENOX) injection  40 mg Subcutaneous Q24H  ? sodium chloride flush  5 mL Intracatheter Q8H  ? ? ?PRN meds: ?acetaminophen **OR** acetaminophen, albuterol, HYDROcodone-acetaminophen, morphine injection, ondansetron **OR** ondansetron (ZOFRAN) IV  ? ?Antimicrobials: ?Anti-infectives (From admission, onward)  ? ? Start     Dose/Rate Route Frequency Ordered Stop  ? 06/23/21 1000  vancomycin (VANCOREADY) IVPB 750 mg/150 mL  Status:  Discontinued       ? 750 mg ?150 mL/hr over 60 Minutes Intravenous Every 24 hours 06/22/21 0827 06/22/21 1404  ? 06/22/21 1500  piperacillin-tazobactam (ZOSYN) IVPB 3.375 g       ? 3.375 g ?12.5 mL/hr over 240 Minutes Intravenous Every 8 hours 06/22/21 1404    ? 06/22/21 0915  vancomycin (VANCOCIN) IVPB 1000 mg/200 mL premix       ? 1,000 mg ?200 mL/hr over 60 Minutes Intravenous  Once 06/22/21 0827 06/22/21 1125  ? 06/21/21 0600  ceFEPIme (MAXIPIME) 2 g in sodium chloride 0.9 % 100 mL IVPB  Status:  Discontinued       ? 2 g ?200 mL/hr over 30 Minutes Intravenous Every 8 hours  06/20/21 2055 06/22/21 1404  ? 06/20/21 2200  piperacillin-tazobactam (ZOSYN) IVPB 3.375 g  Status:  Discontinued       ? 3.375 g ?100 mL/hr over 30 Minutes Intravenous Every 8 hours 06/20/21 1816 06/20/21 2051  ? 06/20/21 2030  ceFEPIme (MAXIPIME) 2 g in sodium chloride 0.9 % 100 mL IVPB       ? 2 g ?200 mL/hr over 30 Minutes Intravenous  Once 06/20/21 2029 06/20/21 2240  ? 06/20/21 2030  metroNIDAZOLE (FLAGYL) IVPB 500 mg  Status:  Discontinued       ? 500 mg ?100 mL/hr over 60 Minutes Intravenous Every 12 hours 06/20/21 2029 06/22/21 1404  ? ?  ? ? ?Objective: ?Vitals:  ? 06/23/21 0602 06/23/21 0750  ?BP: 137/73 (!) 146/78  ?Pulse: 89 88  ?Resp:  17  ?Temp: 97.9 ?F (36.6 ?C) 98.2 ?F (36.8 ?C)  ?SpO2: 94% 94%  ? ? ?Intake/Output Summary (Last 24 hours) at 06/23/2021 1322 ?Last data filed at 06/23/2021 0630 ?Gross per 24 hour  ?Intake 357 ml  ?Output 150 ml  ?Net 207 ml  ? ?There were no vitals filed for this visit. ?Weight change:  ?Body mass index is 23.28 kg/m?.  ? ?Physical Exam: ?General exam: Pleasant, elderly Caucasian female.  Not in physical distress. ?Skin: No rashes, lesions or ulcers. ?HEENT: Atraumatic, normocephalic, no obvious bleeding ?Lungs: Clear to auscultation bilaterally ?CVS: Regular rate and rhythm, no murmur ?GI/Abd soft, nontender, nondistended, bowel sound present ?CNS: Alert, awake, oriented x3 ?Psychiatry: Mood appropriate ?Extremities: No pedal, no calf tenderness ? ?Data Review: I have personally reviewed the laboratory data and studies available. ? ?F/u labs ordered ?Unresulted Labs (From admission, onward)  ? ?  Start     Ordered  ? 06/22/21 4008  Basic metabolic panel  Daily,   R     ? 06/21/21 0841  ? 06/22/21 0500  CBC  Daily,   R     ? 06/21/21 0841  ? 06/20/21 2025  Occult blood card to lab, stool  Once,   R       ? 06/20/21 2029  ? ?  ?  ? ?  ? ? ?Signed, ?Terrilee Croak, MD ?[facility] ?06/23/2021 ? ? ? ? ? ? ? ? ? ? ? ? ?

## 2021-06-23 NOTE — Care Management Important Message (Signed)
Important Message ? ?Patient Details  ?Name: Tracy Estrada ?MRN: [id number] ?Date of Birth: Nov 05, 1950 ? ? ?Medicare Important Message Given:  Yes ? ? ? ? ?[name] ?06/23/2021, 3:08 PM ?

## 2021-06-23 NOTE — Progress Notes (Signed)
? ? ?Supervising Physician: Jacqulynn Cadet ? ?Patient Status:  [facility] - In-pt ? ?Chief Complaint: ? ?Acute diverticulitis with abscess formation, 2 days s/p drain placement by Dr. Annamaria Boots ? ?Subjective: ? ?Feeling better.  Looking forward to starting clears.  Abdomen still tender. ?Sitting up in bed with daughter at bedside and another daughter on speaker phone. ? ?Allergies: ?Demerol [meperidine], Kenalog [triamcinolone acetonide], and Adhesive [tape] ? ?Medications: ?Prior to Admission medications   ?Medication Sig Start Date End Date Taking? Authorizing Provider  ?acetaminophen (TYLENOL) 325 MG tablet Take 650 mg by mouth every 6 (six) hours as needed for moderate pain or headache.   Yes [provider]  ?buPROPion (WELLBUTRIN XL) 150 MG 24 hr tablet Take 2 tablets (300 mg total) by mouth daily. For depression. 03/24/21  Yes [name], NP  ?busPIRone (BUSPAR) 5 MG tablet TAKE 1 TABLET (5 MG TOTAL) BY MOUTH 2 (TWO) TIMES DAILY. FOR ANXIETY 04/19/21  Yes [name], NP  ?Cholecalciferol (VITAMIN D3 PO) Take 1 tablet by mouth daily. Unsure of mg   Yes [provider]  ?ELDERBERRY PO Take 5 mLs by mouth daily.   Yes [provider]  ?fluticasone (FLONASE) 50 MCG/ACT nasal spray Place 2 sprays into the nose daily as needed for allergies.    Yes [provider]  ?Misc Natural Products (NEURIVA PO) Take 1 capsule by mouth daily.   Yes [provider]  ?Multiple Vitamins-Minerals (MULTIVITAMIN WITH MINERALS) tablet Take 1 tablet by mouth daily. Centrum Silver for Women   Yes [provider]  ?Omega-3 Fatty Acids (FISH OIL) 500 MG CAPS Take 500 mg by mouth daily.   Yes [provider]  ?POTASSIUM PO Take 99 mg by mouth daily.   Yes [provider]  ?pravastatin (PRAVACHOL) 40 MG tablet TAKE 1 TABLET BY MOUTH EVERY DAY FOR CHOLESTEROL ?Patient taking differently: Take 40 mg by mouth at bedtime. 05/17/21  Yes [name], NP   ?vitamin B-12 (CYANOCOBALAMIN) 1000 MCG tablet Take 1,000 mcg by mouth daily.   Yes [provider]  ?Capsaicin-Menthol (SALONPAS GEL-PATCH HOT EX) Apply topically. ?Patient not taking: Reported on 06/20/2021    [provider]  ?ciprofloxacin (CIPRO) 500 MG tablet Take 500 mg by mouth See admin instructions. Bid x 5 days ?Patient not taking: Reported on 06/20/2021 06/15/21   [provider]  ? ? ? ?Vital Signs: ?BP (!) 146/78 (BP Location: Left Arm)   Pulse 88   Temp 98.2 ?F (36.8 ?C) (Oral)   Resp 17   Ht 5' (1.524 m)   SpO2 94%   BMI 23.28 kg/m?  ? ?Physical Exam ?Constitutional:   ?   General: She is not in acute distress. ?HENT:  ?   Head: Normocephalic and atraumatic.  ?   Mouth/Throat:  ?   Pharynx: Oropharynx is clear.  ?Eyes:  ?   Extraocular Movements: Extraocular movements intact.  ?Pulmonary:  ?   Effort: Pulmonary effort is normal. No respiratory distress.  ?Abdominal:  ?   General: Abdomen is flat.  ?   Palpations: Abdomen is soft.  ?   Tenderness: There is generalized abdominal tenderness.  ?Skin: ?   General: Skin is warm and dry.  ?Neurological:  ?   General: No focal deficit present.  ?   Mental Status: She is alert.  ?Psychiatric:     ?   Mood and Affect: Mood normal.     ?   Behavior: Behavior normal.  ? ?  Drain: ?Drain Location: LLQ ?Size: Fr size: 12 Fr ?Date of placement: 06/23/21 ?Currently to: Drain collection device: suction bulb ?24 hour output:  ?Output by Drain (mL) 06/21/21 0700 - 06/21/21 1459 06/21/21 1500 - 06/21/21 2259 06/21/21 2300 - 06/22/21 0659 06/22/21 0700 - 06/22/21 1459 06/22/21 1500 - 06/22/21 2259 06/22/21 2300 - 06/23/21 0659 06/23/21 0700 - 06/23/21 1247  ?Closed System Drain Left LLQ Bulb (JP) 12 Fr. 10 70 90  80 70   ? ? ?Current examination: ?Flushes/aspirates easily.  ?Insertion site unremarkable. ?Suture and stat lock in place. ?Dressed appropriately.  ?OP Serosanguinous ? ? ?Imaging: ?DG Chest 2 View ? ?Result Date: 06/20/2021 ?CLINICAL  DATA:  Sepsis EXAM: CHEST - 2 VIEW COMPARISON:  Chest x-ray 11/11/2005 FINDINGS: Heart size and mediastinal contours are within normal limits. No suspicious pulmonary opacities identified. No pleural effusion or pneumothorax visualized. No acute osseous abnormality appreciated. IMPRESSION: No acute intrathoracic process identified. Electronically Signed   By: Ofilia Neas M.D.   On: 06/20/2021 15:22  ? ?CT ABDOMEN PELVIS W CONTRAST ? ?Result Date: 06/20/2021 ?CLINICAL DATA:  Abdominal pain, acute, nonlocalized EXAM: CT ABDOMEN AND PELVIS WITH CONTRAST TECHNIQUE: Multidetector CT imaging of the abdomen and pelvis was performed using the standard protocol following bolus administration of intravenous contrast. RADIATION DOSE REDUCTION: This exam was performed according to the departmental dose-optimization program which includes automated exposure control, adjustment of the mA and/or kV according to patient size and/or use of iterative reconstruction technique. CONTRAST:  154m OMNIPAQUE IOHEXOL 300 MG/ML  SOLN COMPARISON:  CT abdomen 11/16/2005 FINDINGS: Lower chest: No acute abnormality. Hepatobiliary: No focal liver abnormality. No gallstones, gallbladder wall thickening, or pericholecystic fluid. No biliary dilatation. Pancreas: No focal lesion. Normal pancreatic contour. No surrounding inflammatory changes. No main pancreatic ductal dilatation. Normal in size without focal abnormality. Spleen: Normal in size without focal abnormality. Adrenals/Urinary Tract: No adrenal nodule bilaterally. Bilateral kidneys enhance symmetrically. No hydronephrosis. No hydroureter. The urinary bladder is unremarkable. Fat stranding of the fat plane within the sigmoid colon and urinary bladder likely reactive with no definite fistula formation to the urinary bladder. On delayed imaging, there is no urothelial wall thickening and there are no filling defects in the opacified portions of the bilateral collecting systems or  ureters. Stomach/Bowel: Stomach is within normal limits. Second portion of the duodenum diverticula. Scattered colonic diverticulosis. Irregular bowel wall thickening of the sigmoid colon with associated likely intramural abscess formation (3: 36-58). Associated perisigmoid fat stranding. Small of bowel appears to be inseparable from this complex collection within the lower abdomen/pelvis (7:16, 3:37). Appendix appears normal. Vascular/Lymphatic: No abdominal aorta or iliac aneurysm. No abdominal, pelvic, or inguinal lymphadenopathy. Reproductive: Indeterminate 3.8 cm right adnexal fluid collection. Status post hysterectomy. Other: Trace simple free fluid. No intraperitoneal free gas. Complex gas and fluid collection along the mid sigmoid colon. Musculoskeletal: No abdominal wall hernia or abnormality. No suspicious lytic or blastic osseous lesions. No acute displaced fracture. Multilevel degenerative changes of the spine. IMPRESSION: 1. Findings likely represent complicated acute sigmoid diverticulitis with a 10 x 6 cm intramural abscess formation along the mid sigmoid colon. Query associated fistula with a small bowel loop in the right mid abdomen. Underlying malignancy is not excluded. Recommend surgical consultation. 2. Indeterminate 3.8 cm right adnexal fluid collection. Recommend follow-up [facility] 6-12 months. Note: This recommendation does not apply to premenarchal patients and to those with increased risk (genetic, family history, elevated tumor markers or other high-risk factors) of ovarian cancer. Reference: JACR 2020  Feb; 17(2):248-254 These results were called by telephone at the time of interpretation on 06/20/2021 at 6:09 pm to provider [facility] , who verbally acknowledged these results. Electronically Signed   By: Iven Finn M.D.   On: 06/20/2021 18:33  ? ?CT IMAGE GUIDED DRAINAGE BY PERCUTANEOUS CATHETER ? ?Result Date: 06/21/2021 ?INDICATION: Perforated anterior pelvic diverticular abscess  EXAM: CT DRAINAGE OF THE ANTERIOR PELVIC DIVERTICULAR ABSCESS MEDICATIONS: The patient is currently admitted to the hospital and receiving intravenous antibiotics. The antibiotics were administered within an appropriate t

## 2021-06-23 NOTE — Progress Notes (Signed)
[facility] ?Progress Note ? ?   ?Subjective: ?CC-  ?Daughter at bedside. Overall feeling better than yesterday but still has abdominal pain. Denies n/v. Passing flatus. Dysuria improved. ?WBC 4.2, afebrile ? ?Objective: ?Vital signs in last 24 hours: ?Temp:  [97.9 ?F (36.6 ?C)-98.6 ?F (37 ?C)] 98.2 ?F (36.8 ?C) (04/18 0750) ?Pulse Rate:  [88-94] 88 (04/18 0750) ?Resp:  [16-20] 17 (04/18 0750) ?BP: (91-146)/(67-78) 146/78 (04/18 0750) ?SpO2:  [94 %-98 %] 94 % (04/18 0750) ?  ? ?Intake/Output from previous day: ?04/17 0701 - 04/18 0700 ?In: 357 [I.V.:2.3; IV Piggyback:354.7] ?Out: 150 [Drains:150] ?Intake/Output this shift: ?No intake/output data recorded. ? ?PE: ?Gen:  Alert, NAD, pleasant ?Abd: soft, ND, mild/moderate diffuse tenderness without peritonitis, drain cloudy/serosanguinous ? ?Lab Results:  ?Recent Labs  ?  06/22/21 ?0153 06/23/21 ?0100  ?WBC 6.4 4.2  ?HGB 9.6* 9.7*  ?HCT 30.3* 28.5*  ?PLT 245 286  ? ?BMET ?Recent Labs  ?  06/22/21 ?0153 06/23/21 ?0100  ?NA 139 139  ?K 3.0* 3.9  ?CL 108 106  ?CO2 24 23  ?GLUCOSE 71 79  ?BUN 12 8  ?CREATININE 0.57 0.55  ?CALCIUM 8.0* 7.9*  ? ?PT/INR ?Recent Labs  ?  06/20/21 ?1417  ?LABPROT 15.6*  ?INR 1.3*  ? ?CMP  ?   ?Component Value Date/Time  ? NA 139 06/23/2021 0100  ? NA 141 04/07/2016 1348  ? K 3.9 06/23/2021 0100  ? CL 106 06/23/2021 0100  ? CO2 23 06/23/2021 0100  ? GLUCOSE 79 06/23/2021 0100  ? BUN 8 06/23/2021 0100  ? BUN 16 04/07/2016 1348  ? CREATININE 0.55 06/23/2021 0100  ? CREATININE 0.62 09/16/2015 1245  ? CALCIUM 7.9 (L) 06/23/2021 0100  ? PROT 6.0 (L) 06/21/2021 0500  ? PROT 6.7 04/07/2016 1348  ? ALBUMIN 2.6 (L) 06/21/2021 0500  ? ALBUMIN 4.5 04/07/2016 1348  ? AST 12 (L) 06/21/2021 0500  ? ALT 9 06/21/2021 0500  ? ALKPHOS 43 06/21/2021 0500  ? BILITOT 0.8 06/21/2021 0500  ? BILITOT 0.4 04/07/2016 1348  ? GFRNONAA >60 06/23/2021 0100  ? GFRAA >60 06/03/2016 1101  ? ?Lipase  ?   ?Component Value Date/Time  ? LIPASE 25 06/20/2021 1417   ? ? ? ? ? ?Studies/Results: ?CT IMAGE GUIDED DRAINAGE BY PERCUTANEOUS CATHETER ? ?Result Date: 06/21/2021 ?INDICATION: Perforated anterior pelvic diverticular abscess EXAM: CT DRAINAGE OF THE ANTERIOR PELVIC DIVERTICULAR ABSCESS MEDICATIONS: The patient is currently admitted to the hospital and receiving intravenous antibiotics. The antibiotics were administered within an appropriate time frame prior to the initiation of the procedure. ANESTHESIA/SEDATION: Moderate (conscious) sedation was employed during this procedure. A total of Versed 1.5 mg and Fentanyl 50 mcg was administered intravenously by the radiology nurse. Total intra-service moderate Sedation Time: 19 minutes. The patient's level of consciousness and vital signs were monitored continuously by radiology nursing throughout the procedure under my direct supervision. COMPLICATIONS: NONE. PROCEDURE: Informed written consent was obtained from the patient after a thorough discussion of the procedural risks, benefits and alternatives. All questions were addressed. Maximal Sterile Barrier Technique was utilized including caps, mask, sterile gowns, sterile gloves, sterile drape, hand hygiene and skin antiseptic. A timeout was performed prior to the initiation of the procedure. previous imaging reviewed. patient positioned supine. noncontrast localization ct performed. the large midline anterior pelvic complex diverticular abscess was localized and marked for a left lower quadrant anterior approach. Under sterile conditions and local anesthesia, an 18 gauge 10 cm access needle was advanced from a left  lower quadrant anterior oblique approach into the air-fluid collection. Needle position confirmed with CT. Amplatz guidewire advanced within the large air-fluid collection. Tract dilatation performed to insert a 12 [place] drain. Drain catheter position confirmed with CT. Syringe aspiration yielded 20 cc bloody fecal contaminated fluid. Sample sent for culture. This  completely decompressed the air-fluid collection. Catheter secured with a Prolene suture and connected to external suction bulb. Sterile dressing applied. No immediate complication. Patient tolerated the procedure well. IMPRESSION: Successful CT-guided anterior pelvic diverticular abscess drain placement. Electronically Signed   By: Jerilynn Mages.  Shick M.D.   On: 06/21/2021 13:53   ? ?Anti-infectives: ?Anti-infectives (From admission, onward)  ? ? Start     Dose/Rate Route Frequency Ordered Stop  ? 06/23/21 1000  vancomycin (VANCOREADY) IVPB 750 mg/150 mL  Status:  Discontinued       ? 750 mg ?150 mL/hr over 60 Minutes Intravenous Every 24 hours 06/22/21 0827 06/22/21 1404  ? 06/22/21 1500  piperacillin-tazobactam (ZOSYN) IVPB 3.375 g       ? 3.375 g ?12.5 mL/hr over 240 Minutes Intravenous Every 8 hours 06/22/21 1404    ? 06/22/21 0915  vancomycin (VANCOCIN) IVPB 1000 mg/200 mL premix       ? 1,000 mg ?200 mL/hr over 60 Minutes Intravenous  Once 06/22/21 0827 06/22/21 1125  ? 06/21/21 0600  ceFEPIme (MAXIPIME) 2 g in sodium chloride 0.9 % 100 mL IVPB  Status:  Discontinued       ? 2 g ?200 mL/hr over 30 Minutes Intravenous Every 8 hours 06/20/21 2055 06/22/21 1404  ? 06/20/21 2200  piperacillin-tazobactam (ZOSYN) IVPB 3.375 g  Status:  Discontinued       ? 3.375 g ?100 mL/hr over 30 Minutes Intravenous Every 8 hours 06/20/21 1816 06/20/21 2051  ? 06/20/21 2030  ceFEPIme (MAXIPIME) 2 g in sodium chloride 0.9 % 100 mL IVPB       ? 2 g ?200 mL/hr over 30 Minutes Intravenous  Once 06/20/21 2029 06/20/21 2240  ? 06/20/21 2030  metroNIDAZOLE (FLAGYL) IVPB 500 mg  Status:  Discontinued       ? 500 mg ?100 mL/hr over 60 Minutes Intravenous Every 12 hours 06/20/21 2029 06/22/21 1404  ? ?  ? ? ? ?Assessment/Plan ?Sigmoid diverticulitis with perforation, mass? ?- CT scan 4/15 with findings likely represent complicated acute sigmoid diverticulitis with a 10 x 6 cm intramural abscess formation along the mid sigmoid colon.  ?excluded. ?-  IR placed drain 4/16, cx PSEUDOMONAS AERUGINOSA ?- WBC WNL, afebrile, still tender on abdominal exam ?- no indication for acute surgical intervention at this time. ?- Continue IV antibiotics. Ok for clear liquids. Will continue to monitor closely. Hopefully will avoid surgery acutely, but if she worsens may still need surgery this admission.  ?  ?FEN: CLD, IVF ?ID: zosyn 4/15, cefepime/flagyl 4/16>4/17, zosyn 4/17>> ?VTE: lovenox ?  ?I reviewed last 24 h vitals and pain scores, last 48 h intake and output, last 24 h labs and trends (CBC, BMP), and CT scan from 06/20/21. ? ? ? LOS: 3 days  ? ? ?Wellington Hampshire, PA-C ?[facility] ?06/23/2021, 10:41 AM ?Please see Amion for pager number during day hours 7:00am-4:30pm ? ?

## 2021-06-23 NOTE — Plan of Care (Signed)

## 2021-06-24 ENCOUNTER — Ambulatory Visit: Payer: Medicare Other | Admitting: Primary Care

## 2021-06-24 DIAGNOSIS — K5792 Diverticulitis of intestine, part unspecified, without perforation or abscess without bleeding: Secondary | ICD-10-CM | POA: Diagnosis not present

## 2021-06-24 LAB — CBC
HCT: 31.7 % — ABNORMAL LOW (ref 36.0–46.0)
Hemoglobin: 10.5 g/dL — ABNORMAL LOW (ref 12.0–15.0)
MCH: 31.7 pg (ref 26.0–34.0)
MCHC: 33.1 g/dL (ref 30.0–36.0)
MCV: 95.8 fL (ref 80.0–100.0)
Platelets: 287 10*3/uL (ref 150–400)
RBC: 3.31 MIL/uL — ABNORMAL LOW (ref 3.87–5.11)
RDW: 12.6 % (ref 11.5–15.5)
WBC: 2.9 10*3/uL — ABNORMAL LOW (ref 4.0–10.5)
nRBC: 0 % (ref 0.0–0.2)

## 2021-06-24 LAB — BASIC METABOLIC PANEL
Anion gap: 8 (ref 5–15)
BUN: <5 mg/dL — ABNORMAL LOW (ref 8–23)
CO2: 26 mmol/L (ref 22–32)
Calcium: 8 mg/dL — ABNORMAL LOW (ref 8.9–10.3)
Chloride: 107 mmol/L (ref 98–111)
Creatinine, Ser: 0.47 mg/dL (ref 0.44–1.00)
GFR, Estimated: >60 mL/min (ref >60)
Glucose, Bld: 103 mg/dL — ABNORMAL HIGH (ref 70–99)
Potassium: 3.2 mmol/L — ABNORMAL LOW (ref 3.5–5.1)
Sodium: 141 mmol/L (ref 135–145)

## 2021-06-24 LAB — GLUCOSE, CAPILLARY
Glucose-Capillary: 130 mg/dL — ABNORMAL HIGH (ref 70–99)
Glucose-Capillary: 94 mg/dL (ref 70–99)

## 2021-06-24 NOTE — Progress Notes (Signed)
[facility] ?Progress Note ? ?   ?Subjective: ?CC-  ?Daughter at bedside. Tolerated CLD yesterday without n/v/abd and had loose bowel movements overnight without pain. Getting OOB to ambulate. No other complaints ? ?Objective: ?Vital signs in last 24 hours: ?Temp:  [98.1 ?F (36.7 ?C)-98.6 ?F (37 ?C)] 98.1 ?F (36.7 ?C) (04/19 0753) ?Pulse Rate:  [78-95] 79 (04/19 0753) ?Resp:  [16-18] 16 (04/19 0753) ?BP: (126-142)/(59-81) 142/67 (04/19 0753) ?SpO2:  [94 %-96 %] 96 % (04/19 0548) ?Last BM Date : 06/24/21 ? ?Intake/Output from previous day: ?04/18 0701 - 04/19 0700 ?In: 289.6 [I.V.:211.9; IV Piggyback:62.6] ?Out: 800 [Urine:700; Drains:100] ?Intake/Output this shift: ?No intake/output data recorded. ? ?PE: ?Gen:  Alert, NAD, pleasant ?Abd: soft, ND, mild TTP around drain but otherwise nontender. No peritonitis. Drain feculent ? ?Lab Results:  ?Recent Labs  ?  06/23/21 ?0100 06/24/21 ?0210  ?WBC 4.2 2.9*  ?HGB 9.7* 10.5*  ?HCT 28.5* 31.7*  ?PLT 286 287  ? ? ?BMET ?Recent Labs  ?  06/23/21 ?0100 06/24/21 ?0210  ?NA 139 141  ?K 3.9 3.2*  ?CL 106 107  ?CO2 23 26  ?GLUCOSE 79 103*  ?BUN 8 <5*  ?CREATININE 0.55 0.47  ?CALCIUM 7.9* 8.0*  ? ? ?PT/INR ?No results for input(s): LABPROT, INR in the last 72 hours. ? ?CMP  ?   ?Component Value Date/Time  ? NA 141 06/24/2021 0210  ? NA 141 04/07/2016 1348  ? K 3.2 (L) 06/24/2021 0210  ? CL 107 06/24/2021 0210  ? CO2 26 06/24/2021 0210  ? GLUCOSE 103 (H) 06/24/2021 0210  ? BUN <5 (L) 06/24/2021 0210  ? BUN 16 04/07/2016 1348  ? CREATININE 0.47 06/24/2021 0210  ? CREATININE 0.62 09/16/2015 1245  ? CALCIUM 8.0 (L) 06/24/2021 0210  ? PROT 6.0 (L) 06/21/2021 0500  ? PROT 6.7 04/07/2016 1348  ? ALBUMIN 2.6 (L) 06/21/2021 0500  ? ALBUMIN 4.5 04/07/2016 1348  ? AST 12 (L) 06/21/2021 0500  ? ALT 9 06/21/2021 0500  ? ALKPHOS 43 06/21/2021 0500  ? BILITOT 0.8 06/21/2021 0500  ? BILITOT 0.4 04/07/2016 1348  ? GFRNONAA >60 06/24/2021 0210  ? GFRAA >60 06/03/2016 1101  ? ?Lipase  ?    ?Component Value Date/Time  ? LIPASE 25 06/20/2021 1417  ? ? ? ? ? ?Studies/Results: ?No results found. ? ?Anti-infectives: ?Anti-infectives (From admission, onward)  ? ? Start     Dose/Rate Route Frequency Ordered Stop  ? 06/23/21 1000  vancomycin (VANCOREADY) IVPB 750 mg/150 mL  Status:  Discontinued       ? 750 mg ?150 mL/hr over 60 Minutes Intravenous Every 24 hours 06/22/21 0827 06/22/21 1404  ? 06/22/21 1500  piperacillin-tazobactam (ZOSYN) IVPB 3.375 g       ? 3.375 g ?12.5 mL/hr over 240 Minutes Intravenous Every 8 hours 06/22/21 1404    ? 06/22/21 0915  vancomycin (VANCOCIN) IVPB 1000 mg/200 mL premix       ? 1,000 mg ?200 mL/hr over 60 Minutes Intravenous  Once 06/22/21 0827 06/22/21 1125  ? 06/21/21 0600  ceFEPIme (MAXIPIME) 2 g in sodium chloride 0.9 % 100 mL IVPB  Status:  Discontinued       ? 2 g ?200 mL/hr over 30 Minutes Intravenous Every 8 hours 06/20/21 2055 06/22/21 1404  ? 06/20/21 2200  piperacillin-tazobactam (ZOSYN) IVPB 3.375 g  Status:  Discontinued       ? 3.375 g ?100 mL/hr over 30 Minutes Intravenous Every 8 hours 06/20/21 1816 06/20/21  2051  ? 06/20/21 2030  ceFEPIme (MAXIPIME) 2 g in sodium chloride 0.9 % 100 mL IVPB       ? 2 g ?200 mL/hr over 30 Minutes Intravenous  Once 06/20/21 2029 06/20/21 2240  ? 06/20/21 2030  metroNIDAZOLE (FLAGYL) IVPB 500 mg  Status:  Discontinued       ? 500 mg ?100 mL/hr over 60 Minutes Intravenous Every 12 hours 06/20/21 2029 06/22/21 1404  ? ?  ? ? ? ?Assessment/Plan ?Sigmoid diverticulitis with perforation, mass? ?- CT scan 4/15 with findings likely represent complicated acute sigmoid diverticulitis with a 10 x 6 cm intramural abscess formation along the mid sigmoid colon.  ?excluded. ?- IR placed drain 4/16, cx PSEUDOMONAS AERUGINOSA. Now feculent ?- WBC low at 2.9, afebrile, no tenderness on abdominal exam today ?- no indication for acute surgical intervention at this time. ?- Continue IV antibiotics. ?- tolerated CLD. Advance to FLD ?Will continue to  monitor closely. Hopefully will avoid surgery acutely, but if she worsens may still need surgery this admission. Will need colonoscopy after discharge ?  ?FEN: FLD, IVF ?ID: zosyn 4/15, cefepime/flagyl 4/16>4/17, zosyn 4/17>> ?VTE: lovenox ?  ?I reviewed, IR note,  hospitalist notes, last 24 h vitals and pain scores, last 48 h intake and output, and last 24 h labs and trends. ? ? ? ? LOS: 4 days  ? ? ?Winferd Humphrey, PA-C ?[facility] ?06/24/2021, 10:09 AM ?Please see Amion for pager number during day hours 7:00am-4:30pm ? ?

## 2021-06-24 NOTE — Progress Notes (Signed)
Mobility Specialist Progress Note  ? ? 06/24/21 1352  ?Mobility  ?Activity Ambulated with assistance in hallway  ?Level of Assistance Contact guard assist, steadying assist  ?Assistive Device Other (Comment) ?(HHA, hallway railings)  ?Distance Ambulated (ft) 160 ft  ?Activity Response Tolerated well  ?$Mobility charge 1 Mobility  ? ?Pt received in bed and agreeable. Had BM in BR, RN aware. No complaints during. Returned to bed with call bell in reach.  ? ?Tracy Estrada ?Mobility Specialist  ?Primary: 5N M.S. Phone: [phone] ?Secondary: 6N M.S. Phone: [phone] ?  ?

## 2021-06-24 NOTE — Progress Notes (Signed)
Occupational Therapy Treatment ?Patient Details ?Name: Tracy Estrada ?MRN: [id number] ?DOB: 03/24/1950 ?Today's Date: 06/24/2021 ? ? ?History of present illness Pt is a 71 y.o. female admitted 4/15 with acute diverticulitis with abscess formation. Drain placement 4/16.  PMH:  HLD,OA, DDD, fibromyalgia syndrome, depression, anxiety, recurrent UTI ?  ?OT comments ? Pt continues to be weak with impaired standing balance requiring min assist to ambulate and min guard assist for standing grooming at sink. Encouraged continued ambulation with staff.   ? ?Recommendations for follow up therapy are one component of a multi-disciplinary discharge planning process, led by the attending physician.  Recommendations may be updated based on patient status, additional functional criteria and insurance authorization. ?   ?Follow Up Recommendations ? No OT follow up  ?  ?Assistance Recommended at Discharge Set up Supervision/Assistance  ?Patient can return home with the following ? A little help with walking and/or transfers;A little help with bathing/dressing/bathroom;Assistance with cooking/housework;Assist for transportation;Help with stairs or ramp for entrance ?  ?Equipment Recommendations ? None recommended by OT  ?  ?Recommendations for Other Services   ? ?  ?Precautions / Restrictions Precautions ?Precautions: Fall;Other (comment) ?Precaution Comments: 2 syncopal episodes at home PTA, drain L lower abdomen  ? ? ?  ? ?Mobility Bed Mobility ?Overal bed mobility: Needs Assistance ?Bed Mobility: Supine to Sit ?  ?  ?  ?  ?  ?General bed mobility comments: encouraged to use rail rather than pull up on therapist's hand ?  ? ?Transfers ?Overall transfer level: Needs assistance ?  ?Transfers: Sit to/from Stand ?Sit to Stand: Min guard ?  ?  ?  ?  ?  ?  ?  ?  ?Balance Overall balance assessment: Needs assistance ?  ?Sitting balance-Leahy Scale: Good ?  ?  ?  ?Standing balance-Leahy Scale: Poor ?Standing balance comment: hand held assist  or holding rail in hall ?  ?  ?  ?  ?  ?  ?  ?  ?  ?  ?  ?   ? ?ADL either performed or assessed with clinical judgement  ? ?ADL Overall ADL's : Needs assistance/impaired ?  ?  ?  ?  ?  ?  ?  ?  ?Upper Body Dressing : Set up;Sitting ?  ?  ?  ?Toilet Transfer: Minimal assistance;Ambulation;Comfort height toilet;Grab bars ?  ?Toileting- Water quality scientist and Hygiene: Min guard;Sit to/from stand ?  ?  ?  ?Functional mobility during ADLs: Minimal assistance ?  ?  ? ?Extremity/Trunk Assessment   ?  ?  ?  ?  ?  ? ?Vision   ?  ?  ?Perception   ?  ?Praxis   ?  ? ?Cognition Arousal/Alertness: Awake/alert ?Behavior During Therapy: [facility] for tasks assessed/performed ?Overall Cognitive Status: Within Functional Limits for tasks assessed ?  ?  ?  ?  ?  ?  ?  ?  ?  ?  ?  ?  ?  ?  ?  ?  ?  ?  ?  ?   ?Exercises   ? ?  ?Shoulder Instructions   ? ? ?  ?General Comments    ? ? ?Pertinent Vitals/ Pain       Pain Assessment ?Pain Assessment: Faces ?Faces Pain Scale: Hurts a little bit ?Pain Location: abdomen/drain site ?Pain Descriptors / Indicators: Discomfort ?Pain Intervention(s): Monitored during session ? ?Home Living   ?  ?  ?  ?  ?  ?  ?  ?  ?  ?  ?  ?  ?  ?  ?  ?  ?  ?  ? ?  ?  Prior Functioning/Environment    ?  ?  ?  ?   ? ?Frequency ? Min 2X/week  ? ? ? ? ?  ?Progress Toward Goals ? ?OT Goals(current goals can now be found in the care plan section) ? Progress towards OT goals: Progressing toward goals ? ?Acute Rehab OT Goals ?OT Goal Formulation: With patient ?Time For Goal Achievement: 07/06/21 ?Potential to Achieve Goals: Good  ?Plan Discharge plan remains appropriate   ? ?Co-evaluation ? ? ?   ?  ?  ?  ?  ? ?  ?AM-PAC OT "6 Clicks" Daily Activity     ?Outcome Measure ? ? Help from another person eating meals?: None ?Help from another person taking care of personal grooming?: A Little ?Help from another person toileting, which includes using toliet, bedpan, or urinal?: A Little ?Help from another person bathing (including  washing, rinsing, drying)?: A Little ?Help from another person to put on and taking off regular upper body clothing?: None ?Help from another person to put on and taking off regular lower body clothing?: A Lot ?6 Click Score: 19 ? ?  ?End of Session Equipment Utilized During Treatment: Gait belt ? ?OT Visit Diagnosis: Muscle weakness (generalized) (M62.81) ?  ?Activity Tolerance Patient tolerated treatment well ?  ?Patient Left in bed;with call bell/phone within reach;with family/visitor present ?  ?Nurse Communication   ?  ? ?   ? ?Time: 1110-1135 ?OT Time Calculation (min): 25 min ? ?Charges: OT General Charges ?$OT Visit: 1 Visit ?OT Treatments ?$Self Care/Home Management : 8-22 mins ?$Therapeutic Activity: 8-22 mins ? ?Nestor Lewandowsky, OTR/L ?Acute Rehabilitation Services ?Pager: [phone] ?Office: [phone]  ? ?Tracy Estrada ?06/24/2021, 12:01 PM ?

## 2021-06-24 NOTE — Progress Notes (Signed)
?PROGRESS NOTE ? ?Tracy Estrada  ?DOB: [birth year]  ?PCP: [name], NP ?LKG:[id number]  ?DOA: 06/20/2021 ? LOS: 4 days  ?Hospital Day: 5 ? ?Brief narrative: ?Tracy Estrada is a 71 y.o. female with PMH significant for HLD,osteoarthritis, degenerative disc disease and fibromyalgia syndrome, depression, anxiety, as well as recurrent UTI.  ?On 4/10, patient went to urologist, complained of left-sided abdominal pain and was treated with ciprofloxacin.  Urine culture sent at that time grew Klebsiella.  Patient was taking ciprofloxacin as an outpatient.   ?Patient came to the ED on 4/15 with persistent left-sided abdominal pain ?CT scan of the abdomen pelvis at that time showed sigmoid diverticulitis with 10 cm x 6 cm intramural abscess along the mid sigmoid colon.  ?General surgery was consulted.   ?Admitted to hospitalist service ?4/16, patient underwent abscess drainage by IR.  Bloody fecal contaminated purulent fluid was obtained. ? ?Subjective: ?Patient was seen and examined this morning.   ?Lying on bed.  Not in distress.  Daughter at bedside. ?Passing flatus.  Having liquid bowel movements. ?Afebrile.  WBC count 2.9. ? ? ?Principal Problem: ?  Acute diverticulitis ?Active Problems: ?  Intra-abdominal abscess ([place]) ?  Right pelvic adnexal fluid collection ?  Normocytic anemia ?  Anxiety and depression ?  Chronic right shoulder pain ?  ? ?Assessment and Plan: ?Acute sigmoid diverticulitis ?Intra-abdominal abscess ?-Presented with abdominal pain  ?-CT abdomen and pelvis imaging as above showing sigmoid diverticulitis with 10 cm X 6 cm intramural abscess. ?-General surgery and ID consult appreciated.   ?-4/16, patient underwent abscess drainage by IR. ?-Aspirated contents grew gram-positive cocci.  Discussed with pharmacy.  Antibiotic streamlined to IV Zosyn. ?-General surgery following.  Diet advanced to full liquid. ?-Patient started liquid bowel movements.  Continue to monitor. ?Recent Labs  ?Lab  06/20/21 ?1418 06/20/21 ?1655 06/20/21 ?2137 06/21/21 ?0500 06/21/21 ?6644 06/22/21 ?0153 06/23/21 ?0100 06/24/21 ?0210  ?WBC  --   --  11.9* 11.8*  --  6.4 4.2 2.9*  ?LATICACIDVEN 1.7 1.6 1.0  --  0.9  --   --   --   ?PROCALCITON  --   --  0.21 0.28  --  0.20  --   --   ?  ?Right pelvic adnexal fluid collection ?-Her CT abdomen from 4/15, there is an indeterminate 3.8 cm right adnexal fluid collection.  Radiologist recommended follow-up US in 6-12 months. ? ?Hypokalemia ?-Potassium level low this morning at 3.2.  Replacement given. ?Recent Labs  ?Lab 06/20/21 ?1417 06/21/21 ?0500 06/22/21 ?0153 06/23/21 ?0100 06/24/21 ?0210  ?K 3.7 3.8 3.0* 3.9 3.2*  ? ?Acute anemia ?-No acute blood loss.  Hemoglobin level close to baseline. Adequate ferritin, folate and vitamin B12 level.  Continue to monitor. ?Recent Labs  ?  09/09/20 ?1023 05/01/21 ?1330 06/20/21 ?2137 06/21/21 ?0500 06/21/21 ?0347 06/22/21 ?0153 06/23/21 ?0100 06/24/21 ?0210  ?HGB  --    < > 11.0* 10.4*  --  9.6* 9.7* 10.5*  ?MCV  --    < > 98.8 98.2  --  100.0 97.3 95.8  ?[id number]  --   --   --   --  562  --   --   --   ?FOLATE  --   --   --   --  16.1  --   --   --   ?FERRITIN 347*  --   --   --  477*  --   --   --   ?TIBC 310  --   --   --  209*  --   --   --   ?IRON 108  --   --   --  12*  --   --   --   ?RETICCTPCT  --   --   --   --  1.0  --   --   --   ? < > = values in this interval not displayed.  ? ?Hypoglycemia ?-No history of diabetes mellitus.  Not on insulin.  Currently on dextrose solution because of hypoglycemia.   ?-Reduce the rate ?Recent Labs  ?Lab 06/22/21 ?0942 06/22/21 ?1634 06/23/21 ?0601 06/24/21 ?3818 06/24/21 ?2993  ?GLUCAP 60* 51* 88 130* 94  ? ?Anxiety and depression ?-Continue Wellbutrin and BuSpar ?  ?Chronic right shoulder pain ?-Outpatient follow-up.  Patient states she needs surgery. ? ?Goals of care ?  Code Status: Full Code  ? ? ?Mobility: Encourage ambulation ? ?Skin assessment:  ?  ? ?Nutritional status:  ?Body mass index  is 23.28 kg/m?.  ?  ?  ? ? ? ? ?Diet:  ?Diet Order   ? ?       ?  Diet full liquid Room service appropriate? Yes; Fluid consistency: Thin  Diet effective now       ?  ? ?  ?  ? ?  ? ? ?DVT prophylaxis:  ?enoxaparin (LOVENOX) injection 40 mg Start: 06/22/21 0915 ?SCDs Start: 06/20/21 2022 ?  ?Antimicrobials: IV Zosyn ?Fluid: D5 NS at 40 mill per hour ?Consultants: General surgery ?Family Communication: Daughter at bedside ? ?Status is: Inpatient ? ?Continue in-hospital care because: IV antibiotics for acute diverticulitis ?Level of care: Progressive  ? ?Dispo: The patient is from: Home ?             Anticipated d/c is to: Home ? ?             Patient currently is not medically stable to d/c. ?Difficult to place patient No ? ? ? ? ?Infusions:  ? dextrose 5 % and 0.9% NaCl 40 mL/hr at 06/24/21 1024  ? piperacillin-tazobactam (ZOSYN)  IV 3.375 g (06/24/21 1331)  ? ? ?Scheduled Meds: ? buPROPion  300 mg Oral Daily  ? busPIRone  5 mg Oral BID  ? enoxaparin (LOVENOX) injection  40 mg Subcutaneous Q24H  ? sodium chloride flush  5 mL Intracatheter Q8H  ? ? ?PRN meds: ?acetaminophen **OR** acetaminophen, albuterol, HYDROcodone-acetaminophen, morphine injection, ondansetron **OR** ondansetron (ZOFRAN) IV  ? ?Antimicrobials: ?Anti-infectives (From admission, onward)  ? ? Start     Dose/Rate Route Frequency Ordered Stop  ? 06/23/21 1000  vancomycin (VANCOREADY) IVPB 750 mg/150 mL  Status:  Discontinued       ? 750 mg ?150 mL/hr over 60 Minutes Intravenous Every 24 hours 06/22/21 0827 06/22/21 1404  ? 06/22/21 1500  piperacillin-tazobactam (ZOSYN) IVPB 3.375 g       ? 3.375 g ?12.5 mL/hr over 240 Minutes Intravenous Every 8 hours 06/22/21 1404    ? 06/22/21 0915  vancomycin (VANCOCIN) IVPB 1000 mg/200 mL premix       ? 1,000 mg ?200 mL/hr over 60 Minutes Intravenous  Once 06/22/21 0827 06/22/21 1125  ? 06/21/21 0600  ceFEPIme (MAXIPIME) 2 g in sodium chloride 0.9 % 100 mL IVPB  Status:  Discontinued       ? 2 g ?200 mL/hr over 30  Minutes Intravenous Every 8 hours 06/20/21 2055 06/22/21 1404  ? 06/20/21 2200  piperacillin-tazobactam (ZOSYN) IVPB 3.375 g  Status:  Discontinued       ?  3.375 g ?100 mL/hr over 30 Minutes Intravenous Every 8 hours 06/20/21 1816 06/20/21 2051  ? 06/20/21 2030  ceFEPIme (MAXIPIME) 2 g in sodium chloride 0.9 % 100 mL IVPB       ? 2 g ?200 mL/hr over 30 Minutes Intravenous  Once 06/20/21 2029 06/20/21 2240  ? 06/20/21 2030  metroNIDAZOLE (FLAGYL) IVPB 500 mg  Status:  Discontinued       ? 500 mg ?100 mL/hr over 60 Minutes Intravenous Every 12 hours 06/20/21 2029 06/22/21 1404  ? ?  ? ? ?Objective: ?Vitals:  ? 06/24/21 0753 06/24/21 1149  ?BP: (!) 142/67 134/65  ?Pulse: 79 79  ?Resp: 16 16  ?Temp: 98.1 ?F (36.7 ?C) 98 ?F (36.7 ?C)  ?SpO2:    ? ? ?Intake/Output Summary (Last 24 hours) at 06/24/2021 1339 ?Last data filed at 06/24/2021 0102 ?Gross per 24 hour  ?Intake 289.56 ml  ?Output 800 ml  ?Net -510.44 ml  ? ?There were no vitals filed for this visit. ?Weight change:  ?Body mass index is 23.28 kg/m?.  ? ?Physical Exam: ?General exam: Pleasant, elderly Caucasian female.  Not in physical distress. ?Skin: No rashes, lesions or ulcers. ?HEENT: Atraumatic, normocephalic, no obvious bleeding ?Lungs: Clear to auscultation bilaterally ?CVS: Regular rate and rhythm, no murmur ?GI/Abd soft, tenderness improved, nondistended, bowel sound present ?CNS: Alert, awake, oriented x3 ?Psychiatry: Frustrated because a long stay ?Extremities: No pedal, no calf tenderness ? ?Data Review: I have personally reviewed the laboratory data and studies available. ? ?F/u labs ordered ?Unresulted Labs (From admission, onward)  ? ?  Start     Ordered  ? 06/22/21 7253  Basic metabolic panel  Daily,   R     ? 06/21/21 0841  ? 06/22/21 0500  CBC  Daily,   R     ? 06/21/21 0841  ? ?  ?  ? ?  ? ? ?Signed, ?Terrilee Croak, MD ?[facility] ?06/24/2021 ? ? ? ? ? ? ? ? ? ? ? ? ?

## 2021-06-24 NOTE — Progress Notes (Signed)
Physical Therapy Treatment ?Patient Details ?Name: Tracy Estrada ?MRN: [id number] ?DOB: 05/07/1950 ?Today's Date: 06/24/2021 ? ? ?History of Present Illness Pt is a 71 y.o. female admitted 4/15 with acute diverticulitis with abscess formation. Drain placement 4/16.  PMH:  HLD,OA, DDD, fibromyalgia syndrome, depression, anxiety, recurrent UTI ? ?  ?PT Comments  ? ? Pt received supine, stating need for trip to [place] and agreeable to session. Pt grossly min guard throughout session with HHA throughout to steady as pt continues to have some intermittent c/o "swimmy" head feeling. VSS throughout. Pt educated re; increasing activity tolerance and importance of continued mobility. Pt and family verbalizing understanding. Pt continues to benefit from skilled PT services to progress toward functional mobility goals.  ?  ?Recommendations for follow up therapy are one component of a multi-disciplinary discharge planning process, led by the attending physician.  Recommendations may be updated based on patient status, additional functional criteria and insurance authorization. ? ?Follow Up Recommendations ? No PT follow up ?  ?  ?Assistance Recommended at Discharge PRN  ?Patient can return home with the following   ?  ?Equipment Recommendations ? None recommended by PT  ?  ?Recommendations for Other Services   ? ? ?  ?Precautions / Restrictions Precautions ?Precautions: Fall;Other (comment) ?Precaution Comments: 2 syncopal episodes at home PTA, drain L lower abdomen  ?  ? ?Mobility ? Bed Mobility ?Overal bed mobility: Needs Assistance ?Bed Mobility: Supine to Sit ?  ?  ?Supine to sit: Min guard ?  ?  ?General bed mobility comments: pt with good recall of log roll technique ?  ? ?Transfers ?Overall transfer level: Needs assistance ?Equipment used: 1 person hand held assist ?Transfers: Sit to/from Stand ?Sit to Stand: Min guard ?  ?  ?  ?  ?  ?General transfer comment: x2 during session ?  ? ?Ambulation/Gait ?Ambulation/Gait assistance:  Min assist ?Gait Distance (Feet): 220 Feet ?Assistive device: IV Pole, 1 person hand held assist ?Gait Pattern/deviations: Step-through pattern, Decreased stride length ?  ?  ?  ?General Gait Details: Slow, steady gait with HHA on R and IV pole support on L. ? ? ?Stairs ?  ?  ?  ?  ?  ? ? ?Wheelchair Mobility ?  ? ?Modified Rankin (Stroke Patients Only) ?  ? ? ?  ?Balance Overall balance assessment: Needs assistance ?  ?Sitting balance-Leahy Scale: Good ?  ?  ?  ?Standing balance-Leahy Scale: Poor ?Standing balance comment: hand held assist or holding rail in hall ?  ?  ?  ?  ?  ?  ?  ?  ?  ?  ?  ?  ? ?  ?Cognition Arousal/Alertness: Awake/alert ?Behavior During Therapy: [facility] for tasks assessed/performed ?Overall Cognitive Status: Within Functional Limits for tasks assessed ?  ?  ?  ?  ?  ?  ?  ?  ?  ?  ?  ?  ?  ?  ?  ?  ?  ?  ?  ? ?  ?Exercises   ? ?  ?General Comments   ?  ?  ? ?Pertinent Vitals/Pain Pain Assessment ?Pain Assessment: Faces ?Faces Pain Scale: Hurts a little bit ?Pain Location: abdomen/drain site ?Pain Descriptors / Indicators: Discomfort ?Pain Intervention(s): Monitored during session, Limited activity within patient's tolerance  ? ? ?Home Living   ?  ?  ?  ?  ?  ?  ?  ?  ?  ?   ?  ?Prior Function    ?  ?  ?   ? ?  PT Goals (current goals can now be found in the care plan section) Acute Rehab PT Goals ?PT Goal Formulation: With patient ?Time For Goal Achievement: 07/06/21 ? ?  ?Frequency ? ? ? Min 3X/week ? ? ? ?  ?PT Plan    ? ? ?Co-evaluation   ?  ?  ?  ?  ? ?  ?AM-PAC PT "6 Clicks" Mobility   ?Outcome Measure ? Help needed turning from your back to your side while in a flat bed without using bedrails?: None ?Help needed moving from lying on your back to sitting on the side of a flat bed without using bedrails?: A Little ?Help needed moving to and from a bed to a chair (including a wheelchair)?: A Little ?Help needed standing up from a chair using your arms (e.g., wheelchair or bedside chair)?: A  Little ?Help needed to walk in hospital room?: A Little ?Help needed climbing 3-5 steps with a railing? : A Little ?6 Click Score: 19 ? ?  ?End of Session Equipment Utilized During Treatment: Gait belt ?Activity Tolerance: Patient tolerated treatment well ?Patient left: in bed;with call bell/phone within reach;with family/visitor present ?Nurse Communication: Mobility status ?PT Visit Diagnosis: Muscle weakness (generalized) (M62.81);Unsteadiness on feet (R26.81);Pain ?  ? ? ?Time: 1545-1610 ?PT Time Calculation (min) (ACUTE ONLY): 25 min ? ?Charges:  $Therapeutic Exercise: 8-22 mins ?$Therapeutic Activity: 8-22 mins          ?          ? ?Audry Riles. PTA ?Acute Rehabilitation Services ?Office: [phone] ? ? ? ?Betsey Holiday Zhyon Antenucci ?06/24/2021, 4:16 PM ? ?

## 2021-06-24 NOTE — Progress Notes (Signed)
? ? ?Referring Physician(s): ?Michaelle Birks ? ?Supervising Physician: Arne Cleveland ? ?Patient Status:  [facility] - In-pt ? ?Chief Complaint: ? ?Drain check ? ?Brief History: ? ?Tracy Estrada is a 71 yo female who presented to the ED on 06/20/21 with worsening abdominal pain.  ? ?Tracy Estrada started having lower abdominal discomfort and pain several weeks ago, which has slowly been getting worse.  ? ?Tracy Estrada was started on treatment for a UTI but abdominal pain persisted. ? ?CT scan showed sigmoid colon inflammation with an adjacent large gas and fluid collection, consistent with perforated diverticulitis vs a colon mass. ?  ?Tracy Estrada underwent drain placement by Dr. Annamaria Boots on 06/21/21. ? ?Subjective: ? ?Doing well. Sitting up in bed. Family member at bedside. ? ?Allergies: ?Demerol [meperidine], Kenalog [triamcinolone acetonide], and Adhesive [tape] ? ?Medications: ?Prior to Admission medications   ?Medication Sig Start Date End Date Taking? Authorizing Provider  ?acetaminophen (TYLENOL) 325 MG tablet Take 650 mg by mouth every 6 (six) hours as needed for moderate pain or headache.   Yes [provider]  ?buPROPion (WELLBUTRIN XL) 150 MG 24 hr tablet Take 2 tablets (300 mg total) by mouth daily. For depression. 03/24/21  Yes [name], NP  ?busPIRone (BUSPAR) 5 MG tablet TAKE 1 TABLET (5 MG TOTAL) BY MOUTH 2 (TWO) TIMES DAILY. FOR ANXIETY 04/19/21  Yes [name], NP  ?Cholecalciferol (VITAMIN D3 PO) Take 1 tablet by mouth daily. Unsure of mg   Yes [provider]  ?ELDERBERRY PO Take 5 mLs by mouth daily.   Yes [provider]  ?fluticasone (FLONASE) 50 MCG/ACT nasal spray Place 2 sprays into the nose daily as needed for allergies.    Yes [provider]  ?Misc Natural Products (NEURIVA PO) Take 1 capsule by mouth daily.   Yes [provider]  ?Multiple Vitamins-Minerals (MULTIVITAMIN WITH MINERALS) tablet Take 1 tablet by mouth daily. Centrum Silver for Women   Yes [provider]  ?Omega-3 Fatty Acids (FISH OIL) 500 MG CAPS Take 500 mg by mouth daily.   Yes [provider]  ?POTASSIUM PO Take 99 mg by mouth daily.   Yes [provider]  ?pravastatin (PRAVACHOL) 40 MG tablet TAKE 1 TABLET BY MOUTH EVERY DAY FOR CHOLESTEROL ?Patient taking differently: Take 40 mg by mouth at bedtime. 05/17/21  Yes [name], NP  ?vitamin B-12 (CYANOCOBALAMIN) 1000 MCG tablet Take 1,000 mcg by mouth daily.   Yes [provider]  ?Capsaicin-Menthol (SALONPAS GEL-PATCH HOT EX) Apply topically. ?Patient not taking: Reported on 06/20/2021    [provider]  ?ciprofloxacin (CIPRO) 500 MG tablet Take 500 mg by mouth See admin instructions. Bid x 5 days ?Patient not taking: Reported on 06/20/2021 06/15/21   [provider]  ? ? ? ?Vital Signs: ?BP 134/65 (BP Location: Left Arm)   Pulse 79   Temp 98 ?F (36.7 ?C)   Resp 16   Ht 5' (1.524 m)   SpO2 96%   BMI 23.28 kg/m?  ? ?Physical Exam ?Vitals reviewed.  ?Constitutional:   ?   Appearance: Normal appearance.  ?Cardiovascular:  ?   Rate and Rhythm: Normal rate.  ?Pulmonary:  ?   Effort: Pulmonary effort is normal. No respiratory distress.  ?Abdominal:  ?   Palpations: Abdomen is soft.  ?Neurological:  ?   General: No focal deficit present.  ?   Mental Status: Tracy Estrada is alert and oriented to person, place, and time.  ?Psychiatric:     ?  Mood and Affect: Mood normal.     ?   Behavior: Behavior normal.     ?   Thought Content: Thought content normal.     ?   Judgment: Judgment normal.  ?Drain Location: Anterior pelvis ?Size: Fr size: 12 Fr ?Date of placement: 06/21/21  ?Currently to: Drain collection device: suction bulb ?24 hour output:  ?Output by Drain (mL) 06/22/21 0701 - 06/22/21 1900 06/22/21 1901 - 06/23/21 0700 06/23/21 0701 - 06/23/21 1900 06/23/21 1901 - 06/24/21 0700 06/24/21 0701 - 06/24/21 1214  ?Closed System Drain Left LLQ Bulb (JP) 12 Fr. 50 100 40 60   ? ?Current  examination: ?Flushes/aspirates easily.  ?Insertion site unremarkable. ?Suture and stat lock in place. ?Dressed appropriately.  ? ?Imaging: ?DG Chest 2 View ? ?Result Date: 06/20/2021 ?CLINICAL DATA:  Sepsis EXAM: CHEST - 2 VIEW COMPARISON:  Chest x-ray 11/11/2005 FINDINGS: Heart size and mediastinal contours are within normal limits. No suspicious pulmonary opacities identified. No pleural effusion or pneumothorax visualized. No acute osseous abnormality appreciated. IMPRESSION: No acute intrathoracic process identified. Electronically Signed   By: Ofilia Neas M.D.   On: 06/20/2021 15:22  ? ?CT ABDOMEN PELVIS W CONTRAST ? ?Result Date: 06/20/2021 ?CLINICAL DATA:  Abdominal pain, acute, nonlocalized EXAM: CT ABDOMEN AND PELVIS WITH CONTRAST TECHNIQUE: Multidetector CT imaging of the abdomen and pelvis was performed using the standard protocol following bolus administration of intravenous contrast. RADIATION DOSE REDUCTION: This exam was performed according to the departmental dose-optimization program which includes automated exposure control, adjustment of the mA and/or kV according to patient size and/or use of iterative reconstruction technique. CONTRAST:  121m OMNIPAQUE IOHEXOL 300 MG/ML  SOLN COMPARISON:  CT abdomen 11/16/2005 FINDINGS: Lower chest: No acute abnormality. Hepatobiliary: No focal liver abnormality. No gallstones, gallbladder wall thickening, or pericholecystic fluid. No biliary dilatation. Pancreas: No focal lesion. Normal pancreatic contour. No surrounding inflammatory changes. No main pancreatic ductal dilatation. Normal in size without focal abnormality. Spleen: Normal in size without focal abnormality. Adrenals/Urinary Tract: No adrenal nodule bilaterally. Bilateral kidneys enhance symmetrically. No hydronephrosis. No hydroureter. The urinary bladder is unremarkable. Fat stranding of the fat plane within the sigmoid colon and urinary bladder likely reactive with no definite fistula  formation to the urinary bladder. On delayed imaging, there is no urothelial wall thickening and there are no filling defects in the opacified portions of the bilateral collecting systems or ureters. Stomach/Bowel: Stomach is within normal limits. Second portion of the duodenum diverticula. Scattered colonic diverticulosis. Irregular bowel wall thickening of the sigmoid colon with associated likely intramural abscess formation (3: 36-58). Associated perisigmoid fat stranding. Small of bowel appears to be inseparable from this complex collection within the lower abdomen/pelvis (7:16, 3:37). Appendix appears normal. Vascular/Lymphatic: No abdominal aorta or iliac aneurysm. No abdominal, pelvic, or inguinal lymphadenopathy. Reproductive: Indeterminate 3.8 cm right adnexal fluid collection. Status post hysterectomy. Other: Trace simple free fluid. No intraperitoneal free gas. Complex gas and fluid collection along the mid sigmoid colon. Musculoskeletal: No abdominal wall hernia or abnormality. No suspicious lytic or blastic osseous lesions. No acute displaced fracture. Multilevel degenerative changes of the spine. IMPRESSION: 1. Findings likely represent complicated acute sigmoid diverticulitis with a 10 x 6 cm intramural abscess formation along the mid sigmoid colon. Query associated fistula with a small bowel loop in the right mid abdomen. Underlying malignancy is not excluded. Recommend surgical consultation. 2. Indeterminate 3.8 cm right adnexal fluid collection. Recommend follow-up [facility] 6-12 months. Note: This recommendation does not apply to premenarchal  patients and to those with increased risk (genetic, family history, elevated tumor markers or other high-risk factors) of ovarian cancer. Reference: JACR 2020 Feb; 17(2):248-254 These results were called by telephone at the time of interpretation on 06/20/2021 at 6:09 pm to provider [facility] , who verbally acknowledged these results. Electronically Signed    By: Iven Finn M.D.   On: 06/20/2021 18:33  ? ?CT IMAGE GUIDED DRAINAGE BY PERCUTANEOUS CATHETER ? ?Result Date: 06/21/2021 ?INDICATION: Perforated anterior pelvic diverticular abscess EXAM: CT DRAINAGE OF THE ANTERIOR PELVIC DIVERTIC

## 2021-06-25 ENCOUNTER — Telehealth: Payer: Self-pay | Admitting: Primary Care

## 2021-06-25 DIAGNOSIS — K5792 Diverticulitis of intestine, part unspecified, without perforation or abscess without bleeding: Secondary | ICD-10-CM | POA: Diagnosis not present

## 2021-06-25 LAB — CULTURE, BLOOD (ROUTINE X 2)
Culture: NO GROWTH
Culture: NO GROWTH
Special Requests: ADEQUATE
Special Requests: ADEQUATE

## 2021-06-25 LAB — AEROBIC/ANAEROBIC CULTURE W GRAM STAIN (SURGICAL/DEEP WOUND)

## 2021-06-25 LAB — CBC
HCT: 31.9 % — ABNORMAL LOW (ref 36.0–46.0)
Hemoglobin: 10.7 g/dL — ABNORMAL LOW (ref 12.0–15.0)
MCH: 32.1 pg (ref 26.0–34.0)
MCHC: 33.5 g/dL (ref 30.0–36.0)
MCV: 95.8 fL (ref 80.0–100.0)
Platelets: 335 10*3/uL (ref 150–400)
RBC: 3.33 MIL/uL — ABNORMAL LOW (ref 3.87–5.11)
RDW: 12.5 % (ref 11.5–15.5)
WBC: 2.8 10*3/uL — ABNORMAL LOW (ref 4.0–10.5)
nRBC: 0 % (ref 0.0–0.2)

## 2021-06-25 LAB — GLUCOSE, CAPILLARY: Glucose-Capillary: 107 mg/dL — ABNORMAL HIGH (ref 70–99)

## 2021-06-25 LAB — BASIC METABOLIC PANEL
Anion gap: 7 (ref 5–15)
BUN: <5 mg/dL — ABNORMAL LOW (ref 8–23)
CO2: 27 mmol/L (ref 22–32)
Calcium: 8.4 mg/dL — ABNORMAL LOW (ref 8.9–10.3)
Chloride: 106 mmol/L (ref 98–111)
Creatinine, Ser: 0.48 mg/dL (ref 0.44–1.00)
GFR, Estimated: >60 mL/min (ref >60)
Glucose, Bld: 94 mg/dL (ref 70–99)
Potassium: 2.9 mmol/L — ABNORMAL LOW (ref 3.5–5.1)
Sodium: 140 mmol/L (ref 135–145)

## 2021-06-25 LAB — MAGNESIUM: Magnesium: 2.3 mg/dL (ref 1.7–2.4)

## 2021-06-25 MED ORDER — POTASSIUM CHLORIDE 10 MEQ/100ML IV SOLN
10.0000 meq | INTRAVENOUS | Status: AC
  Administered 2021-06-25 (×5): 10 meq via INTRAVENOUS
  Filled 2021-06-25 (×6): qty 100

## 2021-06-25 MED ORDER — IOHEXOL 9 MG/ML PO SOLN
ORAL | Status: AC
  Administered 2021-06-25: 500 mL
  Filled 2021-06-25: qty 1000

## 2021-06-25 MED ORDER — POTASSIUM CHLORIDE CRYS ER 20 MEQ PO TBCR
40.0000 meq | EXTENDED_RELEASE_TABLET | Freq: Two times a day (BID) | ORAL | Status: AC
  Administered 2021-06-25 (×2): 40 meq via ORAL
  Filled 2021-06-25 (×2): qty 2

## 2021-06-25 NOTE — Telephone Encounter (Signed)
Reason for Referral Request: Needs to get a colonoscopy done. ? ?Has patient been seen PCP for this complaint? ?Pt was seen yesterday for a thyroid check- hair loss and losing weight; no appetite; also speak about memory. Pt is now in the hospital, does not know when she will be out of the hospital.  ? ?Referral for which specialty: Colonoscopy ? ?Preferred office/provider: [facility] ? ?  ?

## 2021-06-25 NOTE — Telephone Encounter (Signed)
I have not seen patient since January 2023. ? ?According to our records she is up-to-date on a colonoscopy, she is due in 2027. ? ?Since she is currently hospitalized it would be best to defer this until she is discharged.  Please set her up for hospital follow-up visit so that we can discuss. ?

## 2021-06-25 NOTE — Telephone Encounter (Signed)
Pt is requesting referral for colonoscopy ,please advise   ?

## 2021-06-25 NOTE — Progress Notes (Signed)
[facility] ?Progress Note ? ?   ?Subjective: ?CC-  ?Still having some LLQ pain but continues to improve. Tolerating full liquids. Denies n/v. BM yesterday. ?WBC 2.8, afebrile. ? ?Objective: ?Vital signs in last 24 hours: ?Temp:  [97.7 ?F (36.5 ?C)-98.1 ?F (36.7 ?C)] 98.1 ?F (36.7 ?C) (04/20 0805) ?Pulse Rate:  [71-85] 79 (04/20 0805) ?Resp:  [16-18] 17 (04/20 0805) ?BP: (134-142)/(65-75) 139/75 (04/20 0805) ?SpO2:  [93 %-97 %] 97 % (04/20 0805) ?Last BM Date : 06/24/21 ? ?Intake/Output from previous day: ?04/19 0701 - 04/20 0700 ?In: 1631.9 [I.V.:1385.1; IV Piggyback:236.9] ?Out: 120 [Drains:120] ?Intake/Output this shift: ?No intake/output data recorded. ? ?PE: ?Gen:  Alert, NAD, pleasant ?Abd: soft, ND, mild TTP around drain but otherwise nontender. No peritonitis. Drain feculent ? ?Lab Results:  ?Recent Labs  ?  06/24/21 ?0210 06/25/21 ?0130  ?WBC 2.9* 2.8*  ?HGB 10.5* 10.7*  ?HCT 31.7* 31.9*  ?PLT 287 335  ? ?BMET ?Recent Labs  ?  06/24/21 ?0210 06/25/21 ?0130  ?NA 141 140  ?K 3.2* 2.9*  ?CL 107 106  ?CO2 26 27  ?GLUCOSE 103* 94  ?BUN <5* <5*  ?CREATININE 0.47 0.48  ?CALCIUM 8.0* 8.4*  ? ?PT/INR ?No results for input(s): LABPROT, INR in the last 72 hours. ?CMP  ?   ?Component Value Date/Time  ? NA 140 06/25/2021 0130  ? NA 141 04/07/2016 1348  ? K 2.9 (L) 06/25/2021 0130  ? CL 106 06/25/2021 0130  ? CO2 27 06/25/2021 0130  ? GLUCOSE 94 06/25/2021 0130  ? BUN <5 (L) 06/25/2021 0130  ? BUN 16 04/07/2016 1348  ? CREATININE 0.48 06/25/2021 0130  ? CREATININE 0.62 09/16/2015 1245  ? CALCIUM 8.4 (L) 06/25/2021 0130  ? PROT 6.0 (L) 06/21/2021 0500  ? PROT 6.7 04/07/2016 1348  ? ALBUMIN 2.6 (L) 06/21/2021 0500  ? ALBUMIN 4.5 04/07/2016 1348  ? AST 12 (L) 06/21/2021 0500  ? ALT 9 06/21/2021 0500  ? ALKPHOS 43 06/21/2021 0500  ? BILITOT 0.8 06/21/2021 0500  ? BILITOT 0.4 04/07/2016 1348  ? GFRNONAA >60 06/25/2021 0130  ? GFRAA >60 06/03/2016 1101  ? ?Lipase  ?   ?Component Value Date/Time  ? LIPASE 25  06/20/2021 1417  ? ? ? ? ? ?Studies/Results: ?No results found. ? ?Anti-infectives: ?Anti-infectives (From admission, onward)  ? ? Start     Dose/Rate Route Frequency Ordered Stop  ? 06/23/21 1000  vancomycin (VANCOREADY) IVPB 750 mg/150 mL  Status:  Discontinued       ? 750 mg ?150 mL/hr over 60 Minutes Intravenous Every 24 hours 06/22/21 0827 06/22/21 1404  ? 06/22/21 1500  piperacillin-tazobactam (ZOSYN) IVPB 3.375 g       ? 3.375 g ?12.5 mL/hr over 240 Minutes Intravenous Every 8 hours 06/22/21 1404    ? 06/22/21 0915  vancomycin (VANCOCIN) IVPB 1000 mg/200 mL premix       ? 1,000 mg ?200 mL/hr over 60 Minutes Intravenous  Once 06/22/21 0827 06/22/21 1125  ? 06/21/21 0600  ceFEPIme (MAXIPIME) 2 g in sodium chloride 0.9 % 100 mL IVPB  Status:  Discontinued       ? 2 g ?200 mL/hr over 30 Minutes Intravenous Every 8 hours 06/20/21 2055 06/22/21 1404  ? 06/20/21 2200  piperacillin-tazobactam (ZOSYN) IVPB 3.375 g  Status:  Discontinued       ? 3.375 g ?100 mL/hr over 30 Minutes Intravenous Every 8 hours 06/20/21 1816 06/20/21 2051  ? 06/20/21 2030  ceFEPIme (MAXIPIME) 2  g in sodium chloride 0.9 % 100 mL IVPB       ? 2 g ?200 mL/hr over 30 Minutes Intravenous  Once 06/20/21 2029 06/20/21 2240  ? 06/20/21 2030  metroNIDAZOLE (FLAGYL) IVPB 500 mg  Status:  Discontinued       ? 500 mg ?100 mL/hr over 60 Minutes Intravenous Every 12 hours 06/20/21 2029 06/22/21 1404  ? ?  ? ? ? ?Assessment/Plan ?Sigmoid diverticulitis with perforation, mass? ?- CT scan 4/15 with findings likely represent complicated acute sigmoid diverticulitis with a 10 x 6 cm intramural abscess formation along the mid sigmoid colon.  ?excluded. ?- IR placed drain 4/16, cx PSEUDOMONAS AERUGINOSA. Now feculent ?- WBC low at 2.8, afebrile, clinically improving ?- no indication for acute surgical intervention at this time. ?- Continue IV antibiotics. ?- advance to soft diet ?- will repeat CT scan today ?Will continue to monitor closely. Hopefully will avoid  surgery acutely, but if she worsens may still need surgery this admission. Will need colonoscopy after discharge ?  ?FEN: soft, IVF. Replete K  ?ID: zosyn 4/15, cefepime/flagyl 4/16>4/17, zosyn 4/17>> ?VTE: lovenox ?  ?I reviewed, IR note,  hospitalist notes, last 24 h vitals and pain scores, last 48 h intake and output, and last 24 h labs and trends. ? ? ? LOS: 5 days  ? ? ?Wellington Hampshire, PA-C ?[facility] ?06/25/2021, 10:55 AM ?Please see Amion for pager number during day hours 7:00am-4:30pm ? ?

## 2021-06-25 NOTE — Progress Notes (Signed)
?PROGRESS NOTE ? ?Tracy Estrada  ?DOB: 1950/10/21  ?PCP: [name], NP ?XBM:[id number]  ?DOA: 06/20/2021 ? LOS: 5 days  ?Hospital Day: 6 ? ?Brief narrative: ?Tracy Estrada is a 71 y.o. female with PMH significant for HLD,osteoarthritis, degenerative disc disease and fibromyalgia syndrome, depression, anxiety, as well as recurrent UTI.  ?On 4/10, patient went to urologist, complained of left-sided abdominal pain and was treated with ciprofloxacin.  Urine culture sent at that time grew Klebsiella.  Patient was taking ciprofloxacin as an outpatient.   ?Patient came to the ED on 4/15 with persistent left-sided abdominal pain ?CT scan of the abdomen pelvis at that time showed sigmoid diverticulitis with 10 cm x 6 cm intramural abscess along the mid sigmoid colon.  ?General surgery was consulted.   ?Admitted to hospitalist service ?4/16, patient underwent abscess drainage by IR.  Bloody fecal contaminated purulent fluid was obtained. ? ?Subjective: ?Patient was seen and examined this morning. ?Sitting up at the edge of the bed.  Getting ready to work with therapy.  Not in distress.  Having liquid bowel movements.  Pain improving.  Pending CT scan abdomen today. ?Labs from this morning with low potassium. ? ?Principal Problem: ?  Acute diverticulitis ?Active Problems: ?  Intra-abdominal abscess ([place]) ?  Right pelvic adnexal fluid collection ?  Normocytic anemia ?  Anxiety and depression ?  Chronic right shoulder pain ?  ? ?Assessment and Plan: ?Acute sigmoid diverticulitis ?Intra-abdominal abscess ?-Presented with abdominal pain  ?-CT abdomen and pelvis imaging as above showing sigmoid diverticulitis with 10 cm X 6 cm intramural abscess. ?-General surgery and ID consult appreciated.   ?-4/16, patient underwent abscess drainage by IR. ?-Aspirated contents grew gram-positive cocci.  Discussed with pharmacy.  Antibiotic streamlined to IV Zosyn. ?-General surgery following.  Currently tolerating full liquid diet.   Advance to soft diet by surgery today. ?-Patient started liquid bowel movements.  This is likely because of antibiotics.  Noted that general surgery ordered for repeat CT scan of abdomen today.   ?-Continue to monitor. ?Recent Labs  ?Lab 06/20/21 ?1418 06/20/21 ?1655 06/20/21 ?2137 06/21/21 ?0500 06/21/21 ?0845 06/22/21 ?0153 06/23/21 ?0100 06/24/21 ?0210 06/25/21 ?0130  ?WBC  --   --  11.9* 11.8*  --  6.4 4.2 2.9* 2.8*  ?LATICACIDVEN 1.7 1.6 1.0  --  0.9  --   --   --   --   ?PROCALCITON  --   --  0.21 0.28  --  0.20  --   --   --   ?  ?Right pelvic adnexal fluid collection ?-Her CT abdomen from 4/15, there is an indeterminate 3.8 cm right adnexal fluid collection.  Radiologist recommended follow-up US in 6-12 months. ? ?Hypokalemia ?-Potassium continues to run low because of inadequate oral intake.  2.9 today.  Replacement given.  Continue to monitor ?Recent Labs  ?Lab 06/21/21 ?0500 06/22/21 ?0153 06/23/21 ?0100 06/24/21 ?0210 06/25/21 ?0130  ?K 3.8 3.0* 3.9 3.2* 2.9*  ?MG  --   --   --   --  2.3  ? ?Acute anemia ?-No acute blood loss.  Hemoglobin level close to baseline. Adequate ferritin, folate and vitamin B12 level.  Continue to monitor. ?Recent Labs  ?  09/09/20 ?1023 05/01/21 ?1330 06/21/21 ?0500 06/21/21 ?0845 06/22/21 ?0153 06/23/21 ?0100 06/24/21 ?0210 06/25/21 ?0130  ?HGB  --    < > 10.4*  --  9.6* 9.7* 10.5* 10.7*  ?MCV  --    < > 98.2  --  100.0 97.3  95.8 95.8  ?DXIPJASN05  --   --   --  562  --   --   --   --   ?FOLATE  --   --   --  16.1  --   --   --   --   ?FERRITIN 347*  --   --  477*  --   --   --   --   ?TIBC 310  --   --  209*  --   --   --   --   ?IRON 108  --   --  12*  --   --   --   --   ?RETICCTPCT  --   --   --  1.0  --   --   --   --   ? < > = values in this interval not displayed.  ? ?Hypoglycemia ?-No history of diabetes mellitus.  Not on insulin.  Currently on dextrose solution because of hypoglycemia.   ?-Reduce the rate ?Recent Labs  ?Lab 06/22/21 ?1634 06/23/21 ?0601  06/24/21 ?3976 06/24/21 ?7341 06/25/21 ?9379  ?GLUCAP 51* 88 130* 94 107*  ? ?Anxiety and depression ?-Continue Wellbutrin and BuSpar ?  ?Chronic right shoulder pain ?-Outpatient follow-up.  Patient states she needs surgery. ? ?Goals of care ?  Code Status: Full Code  ? ? ?Mobility: Encourage ambulation ? ?Skin assessment:  ?  ? ?Nutritional status:  ?Body mass index is 23.28 kg/m?.  ?  ?  ? ? ? ? ?Diet:  ?Diet Order   ? ?       ?  DIET SOFT Room service appropriate? Yes; Fluid consistency: Thin  Diet effective now       ?  ? ?  ?  ? ?  ? ? ?DVT prophylaxis:  ?enoxaparin (LOVENOX) injection 40 mg Start: 06/22/21 0915 ?SCDs Start: 06/20/21 2022 ?  ?Antimicrobials: IV Zosyn ?Fluid: Continue D5 NS at 40 mill per hour because of diarrhea ?Consultants: General surgery ?Family Communication: Daughter at bedside ? ?Status is: Inpatient ? ?Continue in-hospital care because: IV antibiotics for acute diverticulitis ?Level of care: Progressive  ? ?Dispo: The patient is from: Home ?             Anticipated d/c is to: Home ? ?             Patient currently is not medically stable to d/c. ?Difficult to place patient No ? ? ? ? ?Infusions:  ? dextrose 5 % and 0.9% NaCl 40 mL/hr at 06/25/21 0649  ? piperacillin-tazobactam (ZOSYN)  IV 3.375 g (06/25/21 0746)  ? potassium chloride 10 mEq (06/25/21 1203)  ? ? ?Scheduled Meds: ? buPROPion  300 mg Oral Daily  ? busPIRone  5 mg Oral BID  ? enoxaparin (LOVENOX) injection  40 mg Subcutaneous Q24H  ? potassium chloride  40 mEq Oral BID  ? sodium chloride flush  5 mL Intracatheter Q8H  ? ? ?PRN meds: ?acetaminophen **OR** acetaminophen, albuterol, HYDROcodone-acetaminophen, morphine injection, ondansetron **OR** ondansetron (ZOFRAN) IV  ? ?Antimicrobials: ?Anti-infectives (From admission, onward)  ? ? Start     Dose/Rate Route Frequency Ordered Stop  ? 06/23/21 1000  vancomycin (VANCOREADY) IVPB 750 mg/150 mL  Status:  Discontinued       ? 750 mg ?150 mL/hr over 60 Minutes Intravenous Every  24 hours 06/22/21 0827 06/22/21 1404  ? 06/22/21 1500  piperacillin-tazobactam (ZOSYN) IVPB 3.375 g       ? 3.375 g ?12.5  mL/hr over 240 Minutes Intravenous Every 8 hours 06/22/21 1404    ? 06/22/21 0915  vancomycin (VANCOCIN) IVPB 1000 mg/200 mL premix       ? 1,000 mg ?200 mL/hr over 60 Minutes Intravenous  Once 06/22/21 0827 06/22/21 1125  ? 06/21/21 0600  ceFEPIme (MAXIPIME) 2 g in sodium chloride 0.9 % 100 mL IVPB  Status:  Discontinued       ? 2 g ?200 mL/hr over 30 Minutes Intravenous Every 8 hours 06/20/21 2055 06/22/21 1404  ? 06/20/21 2200  piperacillin-tazobactam (ZOSYN) IVPB 3.375 g  Status:  Discontinued       ? 3.375 g ?100 mL/hr over 30 Minutes Intravenous Every 8 hours 06/20/21 1816 06/20/21 2051  ? 06/20/21 2030  ceFEPIme (MAXIPIME) 2 g in sodium chloride 0.9 % 100 mL IVPB       ? 2 g ?200 mL/hr over 30 Minutes Intravenous  Once 06/20/21 2029 06/20/21 2240  ? 06/20/21 2030  metroNIDAZOLE (FLAGYL) IVPB 500 mg  Status:  Discontinued       ? 500 mg ?100 mL/hr over 60 Minutes Intravenous Every 12 hours 06/20/21 2029 06/22/21 1404  ? ?  ? ? ?Objective: ?Vitals:  ? 06/25/21 0535 06/25/21 0805  ?BP: (!) 142/67 139/75  ?Pulse: 71 79  ?Resp: 17 17  ?Temp: 97.7 ?F (36.5 ?C) 98.1 ?F (36.7 ?C)  ?SpO2: 95% 97%  ? ? ?Intake/Output Summary (Last 24 hours) at 06/25/2021 1254 ?Last data filed at 06/25/2021 1244 ?Gross per 24 hour  ?Intake 1636.93 ml  ?Output 140 ml  ?Net 1496.93 ml  ? ?There were no vitals filed for this visit. ?Weight change:  ?Body mass index is 23.28 kg/m?.  ? ?Physical Exam: ?General exam: Pleasant, elderly Caucasian female.  Not in physical distress. ?Skin: No rashes, lesions or ulcers. ?HEENT: Atraumatic, normocephalic, no obvious bleeding ?Lungs: Clear to auscultation bilaterally ?CVS: Regular rate and rhythm, no murmur ?GI/Abd soft, tenderness improved, nondistended, bowel sound present ?CNS: Alert, awake, oriented x3 ?Psychiatry: Frustrated because a long stay ?Extremities: No pedal, no calf  tenderness ? ?Data Review: I have personally reviewed the laboratory data and studies available. ? ?F/u labs ordered ?Unresulted Labs (From admission, onward)  ? ?  Start     Ordered  ? 06/22/21 3329  Basic metabolic p

## 2021-06-25 NOTE — Progress Notes (Signed)
Mobility Specialist Progress Note  ? ? 06/25/21 1155  ?Mobility  ?Activity Ambulated with assistance in hallway  ?Level of Assistance Standby assist, set-up cues, supervision of patient - no hands on  ?Assistive Device Other (Comment) ?(HHA, hallway railings)  ?Distance Ambulated (ft) 260 ft  ?Activity Response Tolerated well  ?$Mobility charge 1 Mobility  ? ?Pt received in bed and agreeable. No complaints. Returned to chair with NT and daughter present.  ? ?Hildred Alamin ?Mobility Specialist  ?Primary: [address] Phone: [phone] ?Secondary: [address] Phone: [phone] ?  ?

## 2021-06-26 ENCOUNTER — Inpatient Hospital Stay (HOSPITAL_COMMUNITY): Payer: Medicare Other

## 2021-06-26 DIAGNOSIS — K5792 Diverticulitis of intestine, part unspecified, without perforation or abscess without bleeding: Secondary | ICD-10-CM | POA: Diagnosis not present

## 2021-06-26 LAB — CBC
HCT: 32.3 % — ABNORMAL LOW (ref 36.0–46.0)
Hemoglobin: 10.6 g/dL — ABNORMAL LOW (ref 12.0–15.0)
MCH: 31.7 pg (ref 26.0–34.0)
MCHC: 32.8 g/dL (ref 30.0–36.0)
MCV: 96.7 fL (ref 80.0–100.0)
Platelets: 382 10*3/uL (ref 150–400)
RBC: 3.34 MIL/uL — ABNORMAL LOW (ref 3.87–5.11)
RDW: 12.7 % (ref 11.5–15.5)
WBC: 3.4 10*3/uL — ABNORMAL LOW (ref 4.0–10.5)
nRBC: 0 % (ref 0.0–0.2)

## 2021-06-26 LAB — GLUCOSE, CAPILLARY: Glucose-Capillary: 116 mg/dL — ABNORMAL HIGH (ref 70–99)

## 2021-06-26 LAB — BASIC METABOLIC PANEL
Anion gap: 5 (ref 5–15)
BUN: <5 mg/dL — ABNORMAL LOW (ref 8–23)
CO2: 24 mmol/L (ref 22–32)
Calcium: 8.4 mg/dL — ABNORMAL LOW (ref 8.9–10.3)
Chloride: 106 mmol/L (ref 98–111)
Creatinine, Ser: 0.52 mg/dL (ref 0.44–1.00)
GFR, Estimated: >60 mL/min (ref >60)
Glucose, Bld: 114 mg/dL — ABNORMAL HIGH (ref 70–99)
Potassium: 4.1 mmol/L (ref 3.5–5.1)
Sodium: 135 mmol/L (ref 135–145)

## 2021-06-26 MED ORDER — IOHEXOL 300 MG/ML  SOLN
80.0000 mL | Freq: Once | INTRAMUSCULAR | Status: AC | PRN
  Administered 2021-06-26: 80 mL via INTRAVENOUS

## 2021-06-26 MED ORDER — LIDOCAINE HCL 1 % IJ SOLN
INTRAMUSCULAR | Status: AC
  Filled 2021-06-26: qty 10

## 2021-06-26 MED ORDER — MIDAZOLAM HCL 2 MG/2ML IJ SOLN
INTRAMUSCULAR | Status: AC
  Filled 2021-06-26: qty 2

## 2021-06-26 MED ORDER — SACCHAROMYCES BOULARDII 250 MG PO CAPS
250.0000 mg | ORAL_CAPSULE | Freq: Two times a day (BID) | ORAL | Status: DC
  Administered 2021-06-26 – 2021-06-27 (×3): 250 mg via ORAL
  Filled 2021-06-26 (×3): qty 1

## 2021-06-26 MED ORDER — LOPERAMIDE HCL 2 MG PO CAPS
2.0000 mg | ORAL_CAPSULE | Freq: Two times a day (BID) | ORAL | Status: DC
  Administered 2021-06-26 (×2): 2 mg via ORAL
  Filled 2021-06-26 (×3): qty 1

## 2021-06-26 MED ORDER — FENTANYL CITRATE (PF) 100 MCG/2ML IJ SOLN
INTRAMUSCULAR | Status: AC | PRN
  Administered 2021-06-26 (×2): 25 ug via INTRAVENOUS

## 2021-06-26 MED ORDER — FENTANYL CITRATE (PF) 100 MCG/2ML IJ SOLN
INTRAMUSCULAR | Status: AC
  Filled 2021-06-26: qty 2

## 2021-06-26 MED ORDER — MIDAZOLAM HCL 2 MG/2ML IJ SOLN
INTRAMUSCULAR | Status: AC | PRN
  Administered 2021-06-26 (×2): .5 mg via INTRAVENOUS

## 2021-06-26 NOTE — Consult Note (Signed)
? ? ?Referring Physician(s): ?[facility] ? ?Supervising Physician: Markus Daft ? ?Patient Status:  [facility] - In-pt ? ?Chief Complaint: ? ?Persistent loculated fluid collection within the deep pelvis ? ?History: ?Admitted with diverticulitis and had drain placed into intramural abscess on 4/16, at which point a full H&P was performed.  Drain functioning well.  There is an additional fluid collection, present on initial imaging, and seemingly unchanged from prior, in the pelvis.  Surgery consulted IR regarding this.  Dr. Anselm Pancoast agrees to aspirate with possible placement of drainage catheter.     ? ? ?Allergies: ?Demerol [meperidine], Kenalog [triamcinolone acetonide], and Adhesive [tape] ? ?Medications: ?Prior to Admission medications   ?Medication Sig Start Date End Date Taking? Authorizing Provider  ?acetaminophen (TYLENOL) 325 MG tablet Take 650 mg by mouth every 6 (six) hours as needed for moderate pain or headache.   Yes [provider]  ?buPROPion (WELLBUTRIN XL) 150 MG 24 hr tablet Take 2 tablets (300 mg total) by mouth daily. For depression. 03/24/21  Yes [name], NP  ?busPIRone (BUSPAR) 5 MG tablet TAKE 1 TABLET (5 MG TOTAL) BY MOUTH 2 (TWO) TIMES DAILY. FOR ANXIETY 04/19/21  Yes [name], NP  ?Cholecalciferol (VITAMIN D3 PO) Take 1 tablet by mouth daily. Unsure of mg   Yes [provider]  ?ELDERBERRY PO Take 5 mLs by mouth daily.   Yes [provider]  ?fluticasone (FLONASE) 50 MCG/ACT nasal spray Place 2 sprays into the nose daily as needed for allergies.    Yes [provider]  ?Misc Natural Products (NEURIVA PO) Take 1 capsule by mouth daily.   Yes [provider]  ?Multiple Vitamins-Minerals (MULTIVITAMIN WITH MINERALS) tablet Take 1 tablet by mouth daily. Centrum Silver for Women   Yes [provider]  ?Omega-3 Fatty Acids (FISH OIL) 500 MG CAPS Take 500 mg by mouth daily.   Yes [provider]  ?POTASSIUM PO Take  99 mg by mouth daily.   Yes [provider]  ?pravastatin (PRAVACHOL) 40 MG tablet TAKE 1 TABLET BY MOUTH EVERY DAY FOR CHOLESTEROL ?Patient taking differently: Take 40 mg by mouth at bedtime. 05/17/21  Yes [name], NP  ?vitamin B-12 (CYANOCOBALAMIN) 1000 MCG tablet Take 1,000 mcg by mouth daily.   Yes [provider]  ?Capsaicin-Menthol (SALONPAS GEL-PATCH HOT EX) Apply topically. ?Patient not taking: Reported on 06/20/2021    [provider]  ?ciprofloxacin (CIPRO) 500 MG tablet Take 500 mg by mouth See admin instructions. Bid x 5 days ?Patient not taking: Reported on 06/20/2021 06/15/21   [provider]  ? ? ? ?Vital Signs: ?BP (!) 140/97 (BP Location: Right Arm)   Pulse 87   Temp 98.1 ?F (36.7 ?C) (Oral)   Resp 17   Ht 5' (1.524 m)   Wt 111 lb 8.8 oz (50.6 kg)   SpO2 94%   BMI 21.79 kg/m?  ? ?Physical Exam ?Constitutional:   ?   General: She is not in acute distress. ?   Appearance: She is normal weight. She is not toxic-appearing.  ?HENT:  ?   Head: Normocephalic and atraumatic.  ?   Mouth/Throat:  ?   Mouth: Mucous membranes are moist.  ?   Pharynx: Oropharynx is clear.  ?Eyes:  ?   Extraocular Movements: Extraocular movements intact.  ?Cardiovascular:  ?   Rate and Rhythm: Normal rate and regular rhythm.  ?Pulmonary:  ?   Effort: Pulmonary effort is normal. No respiratory distress.  ?  Abdominal:  ?   General: Abdomen is flat.  ?   Palpations: Abdomen is soft.  ?Skin: ?   General: Skin is warm and dry.  ?Neurological:  ?   General: No focal deficit present.  ?   Mental Status: She is alert and oriented to person, place, and time.  ?Psychiatric:     ?   Mood and Affect: Mood normal.     ?   Behavior: Behavior normal.  ? ?Drain Location: LLQ ?Size: Fr size: 12 Fr ?Date of placement: 06/21/21 ?Currently to: Drain collection device: suction bulb ?24 hour output:  ?Output by Drain (mL) 06/24/21 0700 - 06/24/21 1459 06/24/21 1500 - 06/24/21 2259 06/24/21 2300 -  06/25/21 0659 06/25/21 0700 - 06/25/21 1459 06/25/21 1500 - 06/25/21 2259 06/25/21 2300 - 06/26/21 0659 06/26/21 0700 - 06/26/21 1129  ?Closed System Drain Left LLQ Bulb (JP) 12 Fr. 50 40  50  40   ? ? ?Interval imaging/drain manipulation:  ?Demonstrates interval improvement of the pericolonic fluid collection ? ?Current examination: ?Flushes/aspirates easily.  ?Insertion site unremarkable. ?Suture and stat lock in place. ?Dressed appropriately.  ?OP is murky and brown. ? ? ?Imaging: ?CT ABDOMEN PELVIS W CONTRAST ? ?Result Date: 06/26/2021 ?CLINICAL DATA:  Diverticulitis EXAM: CT ABDOMEN AND PELVIS WITH CONTRAST TECHNIQUE: Multidetector CT imaging of the abdomen and pelvis was performed using the standard protocol following bolus administration of intravenous contrast. RADIATION DOSE REDUCTION: This exam was performed according to the departmental dose-optimization program which includes automated exposure control, adjustment of the mA and/or kV according to patient size and/or use of iterative reconstruction technique. CONTRAST:  45m OMNIPAQUE IOHEXOL 300 MG/ML  SOLN COMPARISON:  06/20/2021 FINDINGS: Lower chest: Small pericardial effusion has slightly increased in size though there is no CT evidence of cardiac tamponade. Cardiac size is within normal limits. Visualized lung bases are clear. Hepatobiliary: No focal liver abnormality is seen. No gallstones, gallbladder wall thickening, or biliary dilatation. Pancreas: Unremarkable Spleen: Unremarkable Adrenals/Urinary Tract: Adrenal glands are unremarkable. Kidneys are normal, without renal calculi, focal lesion, or hydronephrosis. Bladder is unremarkable. Stomach/Bowel: Since the prior examination, a left lower quadrant pigtail drainage catheter has been placed extending in the mid pelvis and completely evacuating the dominant gas and fluid collection within the pericolonic region adjacent to the sigmoid colon. There is persistent extensive circumferential bowel  wall thickening and pericolonic inflammatory stranding involving the sigmoid colon in keeping with changes of acute diverticulitis. Background severe sigmoid diverticulosis. A residual deep pelvic loculated fluid collection is seen at axial image # 70/4 measuring 4.2 x 5.0 cm, mildly enlarged when compared to prior examination. This collection abuts the vaginal cuff. No new loculated intra-abdominal fluid collections are identified. The stomach and small bowel are unremarkable. The large bowel is otherwise unremarkable. No free intraperitoneal gas. Vascular/Lymphatic: No significant vascular findings are present. No enlarged abdominal or pelvic lymph nodes. Reproductive: Status post hysterectomy. No adnexal masses. Other: No abdominal wall hernia. Small focus of gas within the subcutaneous fat of the right lower quadrant anterior abdominal wall may relate to subcutaneous injection. Musculoskeletal: No acute bone abnormality. No lytic or blastic bone lesion. Osseous structures are age-appropriate. IMPRESSION: Interval percutaneous drainage catheter placement within the dominant gas and fluid collection within the pericolonic region with complete evacuation of this collection. Persistent loculated fluid collection within the deep pelvis demonstrating slight interval increase in size, abutting the vaginal cuff. This now measures 5 cm in greatest dimension. Acute sigmoid diverticulitis. Background severe sigmoid diverticulosis. Slight  interval increase in small pericardial effusion. No CT evidence of cardiac tamponade. Electronically Signed   By: Fidela Salisbury M.D.   On: 06/26/2021 03:22   ? ?Labs: ? ?CBC: ?Recent Labs  ?  06/23/21 ?0100 06/24/21 ?0210 06/25/21 ?0130 06/26/21 ?6429  ?WBC 4.2 2.9* 2.8* 3.4*  ?HGB 9.7* 10.5* 10.7* 10.6*  ?HCT 28.5* 31.7* 31.9* 32.3*  ?PLT 286 287 335 382  ? ? ?COAGS: ?Recent Labs  ?  06/20/21 ?1417  ?INR 1.3*  ? ? ?BMP: ?Recent Labs  ?  06/23/21 ?0100 06/24/21 ?0210 06/25/21 ?0130  06/26/21 ?0379  ?NA 139 141 140 135  ?K 3.9 3.2* 2.9* 4.1  ?CL 106 107 106 106  ?CO2 '23 26 27 24  '$ ?GLUCOSE 79 103* 94 114*  ?BUN 8 <5* <5* <5*  ?CALCIUM 7.9* 8.0* 8.4* 8.4*  ?CREATININE 0.55 0.47 0.48 0.52  ?GFRNONAA >60

## 2021-06-26 NOTE — Progress Notes (Signed)
Patient just completed and tolerated 1L oral contrast solution. Pt for CT scan.Transport to pick up pt. ?

## 2021-06-26 NOTE — Procedures (Signed)
Interventional Radiology Procedure Note ? ?Date of Procedure: 06/26/2021  ?Procedure: CT aspiration of pelvic fluid collection  ? ?Findings:  ?1. CT aspiration of pelvic fluid via right transgluteal approach yielded 32 ml of clear yellow serous fluid. No drain placed.   ? ?Complications: No immediate complications noted.  ? ?Estimated Blood Loss: minimal ? ?Follow-up and Recommendations: ?1. None  ? ? ?Albin Felling, MD  ?Vascular & Interventional Radiology  ?06/26/2021 2:37 PM ? ? ? ?

## 2021-06-26 NOTE — Telephone Encounter (Signed)
Lvm for patient to call back

## 2021-06-26 NOTE — Progress Notes (Signed)
?PROGRESS NOTE ? ?Tracy Estrada  ?DOB: [birth year]  ?PCP: [name], NP ?QMV:[id number]  ?DOA: 06/20/2021 ? LOS: 6 days  ?Hospital Day: 7 ? ?Brief narrative: ?Tracy Estrada is a 71 y.o. female with PMH significant for HLD,osteoarthritis, degenerative disc disease and fibromyalgia syndrome, depression, anxiety, as well as recurrent UTI.  ?On 4/10, patient went to urologist, complained of left-sided abdominal pain and was treated with ciprofloxacin.  Urine culture sent at that time grew Klebsiella.  Patient was taking ciprofloxacin as an outpatient.   ?Patient came to the ED on 4/15 with persistent left-sided abdominal pain ?CT scan of the abdomen pelvis at that time showed sigmoid diverticulitis with 10 cm x 6 cm intramural abscess along the mid sigmoid colon.  ?General surgery was consulted.   ?Admitted to hospitalist service ?4/16, patient underwent abscess drainage by IR.  Bloody fecal contaminated purulent fluid was obtained. ? ?Subjective: ?Patient was seen and examined this morning. ?Lying on bed.  Not in distress. ?Having multiple liquid bowel movements.  Was not even able to get to the bathroom last night. ? ?CT abdomen repeated yesterday reviewed. ?Improvement in the intrapelvic abscess size but increase in the size of the pelvic collection was noted.  IR involved.  Noted a plan to drain. ? ?Principal Problem: ?  Acute diverticulitis ?Active Problems: ?  Intra-abdominal abscess ([place]) ?  Right pelvic adnexal fluid collection ?  Normocytic anemia ?  Anxiety and depression ?  Chronic right shoulder pain ?  ? ?Assessment and Plan: ?Acute sigmoid diverticulitis ?Intra-abdominal abscess ?-Presented with abdominal pain  ?-CT abdomen and pelvis imaging as above showing sigmoid diverticulitis with 10 cm X 6 cm intramural abscess. ?-General surgery and ID consult appreciated.   ?-4/16, patient underwent abscess drainage by IR. ?-Aspirated contents showed some growth of Pseudomonas aeruginosa and Staphylococcus  hominis.  Per discussion with pharmacy, antibiotic was streamlined to IV Zosyn. ?-General surgery following.  Currently tolerating soft diet. ?-Repeat CT abdomen from 4/20 showed improvement in the size of intra-abdominal abscess. ?Recent Labs  ?Lab 06/20/21 ?1418 06/20/21 ?1655 06/20/21 ?2137 06/21/21 ?0500 06/21/21 ?0845 06/22/21 ?0153 06/23/21 ?0100 06/24/21 ?0210 06/25/21 ?0130 06/26/21 ?2841  ?WBC  --   --  11.9* 11.8*  --  6.4 4.2 2.9* 2.8* 3.4*  ?LATICACIDVEN 1.7 1.6 1.0  --  0.9  --   --   --   --   --   ?PROCALCITON  --   --  0.21 0.28  --  0.20  --   --   --   --   ?  ?Increasing size of right pelvic adnexal fluid collection ?-Per CT abdomen from 4/15, there is an indeterminate 3.8 cm right adnexal fluid collection.  Radiologist recommended follow-up US in 6-12 months. ?-Repeat CT abdomen and pelvis from 4/21 showed increase in the size of the pelvic collection. ?-IR consulted this morning.  Noted a plan of drainage. ? ?Acute diarrhea ?-For the last few days, patient is having multiple frequent loose bowel movements. ?-No fever, no leukocytosis to suspect C. difficile infection. ?-Discussed with surgeon Dr. Donne Hazel this morning.  For today we will continue IV Zosyn.  Start on Imodium 2 mg twice daily for now. ?-Continue IV hydration at increased rate of 75 mill per hour. ? ?Hypokalemia ?-Potassium was running low because of low intake.  Intake improved.  Potassium level improved.  Continue to watch because of diarrhea. ?Recent Labs  ?Lab 06/22/21 ?0153 06/23/21 ?0100 06/24/21 ?0210 06/25/21 ?0130 06/26/21 ?3244  ?K  3.0* 3.9 3.2* 2.9* 4.1  ?MG  --   --   --  2.3  --   ? ?Acute anemia ?-No acute blood loss.  Hemoglobin level close to baseline. Adequate ferritin, folate and vitamin B12 level.  Continue to monitor. ?Recent Labs  ?  09/09/20 ?1023 05/01/21 ?1330 06/21/21 ?0845 06/22/21 ?0153 06/23/21 ?0100 06/24/21 ?0210 06/25/21 ?0130 06/26/21 ?5465  ?HGB  --    < >  --  9.6* 9.7* 10.5* 10.7* 10.6*  ?MCV  --     < >  --  100.0 97.3 95.8 95.8 96.7  ?VITAMINB12  --   --  562  --   --   --   --   --   ?FOLATE  --   --  16.1  --   --   --   --   --   ?FERRITIN 347*  --  477*  --   --   --   --   --   ?TIBC 310  --  209*  --   --   --   --   --   ?IRON 108  --  12*  --   --   --   --   --   ?RETICCTPCT  --   --  1.0  --   --   --   --   --   ? < > = values in this interval not displayed.  ? ?Anxiety and depression ?-Continue Wellbutrin and BuSpar ?  ?Chronic right shoulder pain ?-Outpatient follow-up.  Patient states she needs surgery. ? ?Goals of care ?  Code Status: Full Code  ? ? ?Mobility: Encourage ambulation ? ?Skin assessment:  ?  ? ?Nutritional status:  ?Body mass index is 21.79 kg/m?.  ?  ?  ? ? ? ? ?Diet:  ?Diet Order   ? ?       ?  Diet NPO time specified Except for: Sips with Meds  Diet effective now       ?  ? ?  ?  ? ?  ? ? ?DVT prophylaxis:  ?enoxaparin (LOVENOX) injection 40 mg Start: 06/22/21 0915 ?SCDs Start: 06/20/21 2022 ?  ?Antimicrobials: IV Zosyn ?Fluid: Continue D5 NS at 70 mill per hour ?Consultants: General surgery ?Family Communication: Daughter at bedside ? ?Status is: Inpatient ? ?Continue in-hospital care because: Pending IR drainage of pelvic abscess. ?Level of care: Med-Surg  ? ?Dispo: The patient is from: Home ?             Anticipated d/c is to: Home ? ?             Patient currently is not medically stable to d/c. ?Difficult to place patient No ? ? ? ? ?Infusions:  ? dextrose 5 % and 0.9% NaCl 75 mL/hr at 06/26/21 1206  ? piperacillin-tazobactam (ZOSYN)  IV 3.375 g (06/26/21 0558)  ? ? ?Scheduled Meds: ? buPROPion  300 mg Oral Daily  ? busPIRone  5 mg Oral BID  ? enoxaparin (LOVENOX) injection  40 mg Subcutaneous Q24H  ? loperamide  2 mg Oral BID  ? saccharomyces boulardii  250 mg Oral BID  ? sodium chloride flush  5 mL Intracatheter Q8H  ? ? ?PRN meds: ?acetaminophen **OR** acetaminophen, albuterol, HYDROcodone-acetaminophen, morphine injection, ondansetron **OR** ondansetron (ZOFRAN) IV   ? ?Antimicrobials: ?Anti-infectives (From admission, onward)  ? ? Start     Dose/Rate Route Frequency Ordered Stop  ? 06/23/21 1000  vancomycin (VANCOREADY)  IVPB 750 mg/150 mL  Status:  Discontinued       ? 750 mg ?150 mL/hr over 60 Minutes Intravenous Every 24 hours 06/22/21 0827 06/22/21 1404  ? 06/22/21 1500  piperacillin-tazobactam (ZOSYN) IVPB 3.375 g       ? 3.375 g ?12.5 mL/hr over 240 Minutes Intravenous Every 8 hours 06/22/21 1404    ? 06/22/21 0915  vancomycin (VANCOCIN) IVPB 1000 mg/200 mL premix       ? 1,000 mg ?200 mL/hr over 60 Minutes Intravenous  Once 06/22/21 0827 06/22/21 1125  ? 06/21/21 0600  ceFEPIme (MAXIPIME) 2 g in sodium chloride 0.9 % 100 mL IVPB  Status:  Discontinued       ? 2 g ?200 mL/hr over 30 Minutes Intravenous Every 8 hours 06/20/21 2055 06/22/21 1404  ? 06/20/21 2200  piperacillin-tazobactam (ZOSYN) IVPB 3.375 g  Status:  Discontinued       ? 3.375 g ?100 mL/hr over 30 Minutes Intravenous Every 8 hours 06/20/21 1816 06/20/21 2051  ? 06/20/21 2030  ceFEPIme (MAXIPIME) 2 g in sodium chloride 0.9 % 100 mL IVPB       ? 2 g ?200 mL/hr over 30 Minutes Intravenous  Once 06/20/21 2029 06/20/21 2240  ? 06/20/21 2030  metroNIDAZOLE (FLAGYL) IVPB 500 mg  Status:  Discontinued       ? 500 mg ?100 mL/hr over 60 Minutes Intravenous Every 12 hours 06/20/21 2029 06/22/21 1404  ? ?  ? ? ?Objective: ?Vitals:  ? 06/26/21 0611 06/26/21 0758  ?BP: 133/70 (!) 140/97  ?Pulse: 85 87  ?Resp: 17 17  ?Temp: 98.2 ?F (36.8 ?C) 98.1 ?F (36.7 ?C)  ?SpO2: 96% 94%  ? ? ?Intake/Output Summary (Last 24 hours) at 06/26/2021 1248 ?Last data filed at 06/26/2021 8756 ?Gross per 24 hour  ?Intake --  ?Output 40 ml  ?Net -40 ml  ? ?Filed Weights  ? 06/26/21 0600  ?Weight: 50.6 kg  ? ?Weight change:  ?Body mass index is 21.79 kg/m?.  ? ?Physical Exam: ?General exam: Pleasant, elderly Caucasian female.  Not in physical distress. ?Skin: No rashes, lesions or ulcers. ?HEENT: Atraumatic, normocephalic, no obvious  bleeding ?Lungs: Clear to auscultation bilaterally ?CVS: Regular rate and rhythm, no murmur ?GI/Abd soft, tenderness improved, nondistended, bowel sound present ?CNS: Alert, awake, oriented x3 ?Psychiatry: Frustrate

## 2021-06-26 NOTE — Progress Notes (Signed)
PT Cancellation Note ? ?Patient Details ?Name: Tracy Estrada ?MRN: [id number] ?DOB: [birth year] ? ? ?Cancelled Treatment:    Reason Eval/Treat Not Completed: Patient declined, no reason specified. PT attempts to see pt twice on this date, initially pt leaving floor to go to IR. On 2nd attempt pt declines mobility, preferring to rest. Pt expresses no concerns about mobilizing at home, has support of spouse and other family and already owns a RW. PT will follow up as time allows. ? ? ?Zenaida Niece ?06/26/2021, 3:51 PM ?

## 2021-06-26 NOTE — Sedation Documentation (Signed)
Needle aspiration of 32 cc clear yellow fluid per Dr Denna Haggard. No drain inserted. ?

## 2021-06-26 NOTE — Progress Notes (Signed)
[facility] ?Progress Note ? ?   ?Subjective: ?CC-  ?Continues to have some abdominal pain but overall much better. Tolerating diet. Had a few loose stools yesterday. ?CT shows improved fluid collection with drain in place, and persistent deep pelvic fluid collection is a little bigger. ? ?Objective: ?Vital signs in last 24 hours: ?Temp:  [97.8 ?F (36.6 ?C)-98.2 ?F (36.8 ?C)] 98.1 ?F (36.7 ?C) (04/21 1025) ?Pulse Rate:  [85-96] 87 (04/21 0758) ?Resp:  [17-19] 17 (04/21 0758) ?BP: (125-142)/(57-97) 140/97 (04/21 0758) ?SpO2:  [94 %-96 %] 94 % (04/21 0758) ?Weight:  [50.6 kg] 50.6 kg (04/21 0600) ?Last BM Date : 06/24/21 ? ?Intake/Output from previous day: ?04/20 0701 - 04/21 0700 ?In: 5  ?Out: 60 [Drains:60] ?Intake/Output this shift: ?No intake/output data recorded. ? ?PE: ?Gen:  Alert, NAD, pleasant ?Abd: soft, ND, mild TTP around drain but otherwise nontender. No peritonitis. Drain feculent  ? ?Lab Results:  ?Recent Labs  ?  06/25/21 ?0130 06/26/21 ?8527  ?WBC 2.8* 3.4*  ?HGB 10.7* 10.6*  ?HCT 31.9* 32.3*  ?PLT 335 382  ? ?BMET ?Recent Labs  ?  06/25/21 ?0130 06/26/21 ?7824  ?NA 140 135  ?K 2.9* 4.1  ?CL 106 106  ?CO2 27 24  ?GLUCOSE 94 114*  ?BUN <5* <5*  ?CREATININE 0.48 0.52  ?CALCIUM 8.4* 8.4*  ? ?PT/INR ?No results for input(s): LABPROT, INR in the last 72 hours. ?CMP  ?   ?Component Value Date/Time  ? NA 135 06/26/2021 0259  ? NA 141 04/07/2016 1348  ? K 4.1 06/26/2021 0259  ? CL 106 06/26/2021 0259  ? CO2 24 06/26/2021 0259  ? GLUCOSE 114 (H) 06/26/2021 0259  ? BUN <5 (L) 06/26/2021 0259  ? BUN 16 04/07/2016 1348  ? CREATININE 0.52 06/26/2021 0259  ? CREATININE 0.62 09/16/2015 1245  ? CALCIUM 8.4 (L) 06/26/2021 0259  ? PROT 6.0 (L) 06/21/2021 0500  ? PROT 6.7 04/07/2016 1348  ? ALBUMIN 2.6 (L) 06/21/2021 0500  ? ALBUMIN 4.5 04/07/2016 1348  ? AST 12 (L) 06/21/2021 0500  ? ALT 9 06/21/2021 0500  ? ALKPHOS 43 06/21/2021 0500  ? BILITOT 0.8 06/21/2021 0500  ? BILITOT 0.4 04/07/2016 1348  ?  GFRNONAA >60 06/26/2021 0259  ? GFRAA >60 06/03/2016 1101  ? ?Lipase  ?   ?Component Value Date/Time  ? LIPASE 25 06/20/2021 1417  ? ? ? ? ? ?Studies/Results: ?CT ABDOMEN PELVIS W CONTRAST ? ?Result Date: 06/26/2021 ?CLINICAL DATA:  Diverticulitis EXAM: CT ABDOMEN AND PELVIS WITH CONTRAST TECHNIQUE: Multidetector CT imaging of the abdomen and pelvis was performed using the standard protocol following bolus administration of intravenous contrast. RADIATION DOSE REDUCTION: This exam was performed according to the departmental dose-optimization program which includes automated exposure control, adjustment of the mA and/or kV according to patient size and/or use of iterative reconstruction technique. CONTRAST:  3m OMNIPAQUE IOHEXOL 300 MG/ML  SOLN COMPARISON:  06/20/2021 FINDINGS: Lower chest: Small pericardial effusion has slightly increased in size though there is no CT evidence of cardiac tamponade. Cardiac size is within normal limits. Visualized lung bases are clear. Hepatobiliary: No focal liver abnormality is seen. No gallstones, gallbladder wall thickening, or biliary dilatation. Pancreas: Unremarkable Spleen: Unremarkable Adrenals/Urinary Tract: Adrenal glands are unremarkable. Kidneys are normal, without renal calculi, focal lesion, or hydronephrosis. Bladder is unremarkable. Stomach/Bowel: Since the prior examination, a left lower quadrant pigtail drainage catheter has been placed extending in the mid pelvis and completely evacuating the dominant gas and fluid collection within  the pericolonic region adjacent to the sigmoid colon. There is persistent extensive circumferential bowel wall thickening and pericolonic inflammatory stranding involving the sigmoid colon in keeping with changes of acute diverticulitis. Background severe sigmoid diverticulosis. A residual deep pelvic loculated fluid collection is seen at axial image # 70/4 measuring 4.2 x 5.0 cm, mildly enlarged when compared to prior examination.  This collection abuts the vaginal cuff. No new loculated intra-abdominal fluid collections are identified. The stomach and small bowel are unremarkable. The large bowel is otherwise unremarkable. No free intraperitoneal gas. Vascular/Lymphatic: No significant vascular findings are present. No enlarged abdominal or pelvic lymph nodes. Reproductive: Status post hysterectomy. No adnexal masses. Other: No abdominal wall hernia. Small focus of gas within the subcutaneous fat of the right lower quadrant anterior abdominal wall may relate to subcutaneous injection. Musculoskeletal: No acute bone abnormality. No lytic or blastic bone lesion. Osseous structures are age-appropriate. IMPRESSION: Interval percutaneous drainage catheter placement within the dominant gas and fluid collection within the pericolonic region with complete evacuation of this collection. Persistent loculated fluid collection within the deep pelvis demonstrating slight interval increase in size, abutting the vaginal cuff. This now measures 5 cm in greatest dimension. Acute sigmoid diverticulitis. Background severe sigmoid diverticulosis. Slight interval increase in small pericardial effusion. No CT evidence of cardiac tamponade. Electronically Signed   By: Fidela Salisbury M.D.   On: 06/26/2021 03:22   ? ?Anti-infectives: ?Anti-infectives (From admission, onward)  ? ? Start     Dose/Rate Route Frequency Ordered Stop  ? 06/23/21 1000  vancomycin (VANCOREADY) IVPB 750 mg/150 mL  Status:  Discontinued       ? 750 mg ?150 mL/hr over 60 Minutes Intravenous Every 24 hours 06/22/21 0827 06/22/21 1404  ? 06/22/21 1500  piperacillin-tazobactam (ZOSYN) IVPB 3.375 g       ? 3.375 g ?12.5 mL/hr over 240 Minutes Intravenous Every 8 hours 06/22/21 1404    ? 06/22/21 0915  vancomycin (VANCOCIN) IVPB 1000 mg/200 mL premix       ? 1,000 mg ?200 mL/hr over 60 Minutes Intravenous  Once 06/22/21 0827 06/22/21 1125  ? 06/21/21 0600  ceFEPIme (MAXIPIME) 2 g in sodium chloride  0.9 % 100 mL IVPB  Status:  Discontinued       ? 2 g ?200 mL/hr over 30 Minutes Intravenous Every 8 hours 06/20/21 2055 06/22/21 1404  ? 06/20/21 2200  piperacillin-tazobactam (ZOSYN) IVPB 3.375 g  Status:  Discontinued       ? 3.375 g ?100 mL/hr over 30 Minutes Intravenous Every 8 hours 06/20/21 1816 06/20/21 2051  ? 06/20/21 2030  ceFEPIme (MAXIPIME) 2 g in sodium chloride 0.9 % 100 mL IVPB       ? 2 g ?200 mL/hr over 30 Minutes Intravenous  Once 06/20/21 2029 06/20/21 2240  ? 06/20/21 2030  metroNIDAZOLE (FLAGYL) IVPB 500 mg  Status:  Discontinued       ? 500 mg ?100 mL/hr over 60 Minutes Intravenous Every 12 hours 06/20/21 2029 06/22/21 1404  ? ?  ? ? ? ?Assessment/Plan ?Sigmoid diverticulitis with perforation, mass? ?- CT scan 4/15 with findings likely represent complicated acute sigmoid diverticulitis with a 10 x 6 cm intramural abscess formation along the mid sigmoid colon.  ?excluded. ?- IR placed drain 4/16, cx PSEUDOMONAS AERUGINOSA. Now feculent ?- repeat CT 4/21 shows improving drained fluid collection but enlarging deep pelvic fluid collection. IR planning aspiration/drain of pelvic fluid collection ?- Continue IV antibiotics. Ok to restart soft diet when she gets  back from procedure ?Will continue to monitor closely. Hopefully will avoid surgery acutely, but if she worsens may still need surgery this admission. Will need colonoscopy after discharge ?  ?FEN: soft, IVF ?ID: zosyn 4/15, cefepime/flagyl 4/16>4/17, zosyn 4/17>> ?VTE: lovenox ?  ?I reviewed, IR note,  hospitalist notes, last 24 h vitals and pain scores, last 48 h intake and output, and last 24 h labs and trends (CBC, BMP), and last 24 hours of imaging (CT abdomen/pelvis) ? ? ? LOS: 6 days  ? ? ?Wellington Hampshire, PA-C ?[facility] ?06/26/2021, 9:43 AM ?Please see Amion for pager number during day hours 7:00am-4:30pm ? ?

## 2021-06-27 DIAGNOSIS — K5792 Diverticulitis of intestine, part unspecified, without perforation or abscess without bleeding: Secondary | ICD-10-CM | POA: Diagnosis not present

## 2021-06-27 LAB — GLUCOSE, CAPILLARY: Glucose-Capillary: 95 mg/dL (ref 70–99)

## 2021-06-27 MED ORDER — HYDROCODONE-ACETAMINOPHEN 5-325 MG PO TABS
1.0000 | ORAL_TABLET | Freq: Four times a day (QID) | ORAL | 0 refills | Status: AC | PRN
Start: 2021-06-27 — End: 2021-06-30

## 2021-06-27 MED ORDER — SACCHAROMYCES BOULARDII 250 MG PO CAPS: 250.0000 mg | ORAL_CAPSULE | Freq: Two times a day (BID) | ORAL | 0 refills | Status: AC

## 2021-06-27 MED ORDER — LOPERAMIDE HCL 2 MG PO CAPS
2.0000 mg | ORAL_CAPSULE | Freq: Two times a day (BID) | ORAL | 0 refills | Status: AC | PRN
Start: 2021-06-27 — End: 2021-07-02

## 2021-06-27 MED ORDER — AMOXICILLIN-POT CLAVULANATE 875-125 MG PO TABS: 1.0000 | ORAL_TABLET | Freq: Two times a day (BID) | ORAL | 0 refills | Status: AC

## 2021-06-27 NOTE — TOC Transition Note (Signed)
Transition of Care (TOC) - CM/SW Discharge Note ? ? ?Patient Details  ?Name: Tracy Estrada ?MRN: [id number] ?Date of Birth: 1950-12-05 ? ?Transition of Care (TOC) CM/SW Contact:  ?Bartholomew Crews, RN ?Phone Number: [phone] ?06/27/2021, 1:05 PM ? ? ?Clinical Narrative:    ? ?Patient to transition home today. No TOC needs identified. Noted PT/OT evaluations with recommendations for no follow up needed.  ? ?Final next level of care: Home/Self Care ?Barriers to Discharge: No Barriers Identified ? ? ?Patient Goals and CMS Choice ?  ?  ?  ? ?Discharge Placement ?  ?           ?  ?  ?  ?  ? ?Discharge Plan and Services ?  ?  ?           ?  ?  ?  ?  ?  ?  ?  ?  ?  ?  ? ?Social Determinants of Health (SDOH) Interventions ?  ? ? ?Readmission Risk Interventions ?   ? View : No data to display.  ?  ?  ?  ? ? ? ? ? ?

## 2021-06-27 NOTE — Discharge Summary (Signed)
? ?Physician Discharge Summary  ?Tracy Estrada OVF:[id number] DOB: 1950-10-31 DOA: 06/20/2021 ? ?PCP: [name], NP ? ?Admit date: 06/20/2021 ?Discharge date: 06/27/2021 ? ?Admitted From: Home ?Discharge disposition: Home ? ?Brief narrative: ?Tracy Estrada is a 71 y.o. female with PMH significant for HLD,osteoarthritis, degenerative disc disease and fibromyalgia syndrome, depression, anxiety, as well as recurrent UTI.  ?On 4/10, patient went to urologist, complained of left-sided abdominal pain and was treated with ciprofloxacin.  Urine culture sent at that time grew Klebsiella.  Patient was taking ciprofloxacin as an outpatient.   ?Patient came to the ED on 4/15 with persistent left-sided abdominal pain ?CT scan of the abdomen pelvis at that time showed sigmoid diverticulitis with 10 cm x 6 cm intramural abscess along the mid sigmoid colon.  ?General surgery was consulted.   ?Admitted to hospitalist service ?4/16, patient underwent abscess drainage by IR.  Bloody fecal contaminated purulent fluid was obtained. ? ?Subjective: ?Patient was seen and examined this morning. ?Sitting up at the edge of the bed.  Not in distress.  Diarrhea improved with Imodium 1 dose. ?Tolerating soft diet. ?Labs stable. ?General surgery consult appreciated.  Okay to discharge home today. ? ?Principal Problem: ?  Acute diverticulitis ?Active Problems: ?  Intra-abdominal abscess ([place]) ?  Right pelvic adnexal fluid collection ?  Normocytic anemia ?  Anxiety and depression ?  Chronic right shoulder pain ?  ? ?Hospital course: ?Acute sigmoid diverticulitis ?Intra-abdominal abscess ?-Presented with abdominal pain  ?-CT abdomen and pelvis imaging as above showing sigmoid diverticulitis with 10 cm X 6 cm intramural abscess. ?-General surgery and ID consult appreciated.   ?-4/16, patient underwent abscess drainage by IR. ?-Aspirated contents showed some growth of Pseudomonas aeruginosa and Staphylococcus hominis.  Per discussion with  pharmacy, antibiotic was streamlined to IV Zosyn. ?-General surgery following.  Currently tolerating soft diet. ?-Repeat CT abdomen from 4/20 showed improvement in the size of intra-abdominal abscess. ?-Per my discussion with general surgery today, will discharge her on Augmentin for 7 more days to complete 2 weeks course of antibiotics.  Probiotics along with.  To follow-up with general surgery as an outpatient.  Soft diet for next few days and gradually upgrade to regular consistency ?-Patient to follow-up with GI as an outpatient for colonoscopy next few weeks. ? ?Right pelvic adnexal fluid collection ?-4/15, CT abdomen/pelvis showed an indeterminate 3.8 cm right adnexal fluid collection.  Radiologist recommended follow-up US in 6-12 months. However a repeat CT on /21 showed increase in the size of the pelvic collection. IR was consulted.  She underwent CT-guided aspiration of the pelvic fluid via right transgluteal approach that yielded 30 to mL of clear yellow serous fluid.  No drain placement was required.   ? ?Acute diarrhea ?-For the last few days, patient was having multiple frequent loose bowel movements. ?-No fever, no leukocytosis to suspect C. difficile infection. ?-Patient was tried on Imodium.  With 1 dose, diarrhea significantly improved. ?-As needed Imodium at home. ? ?Hypokalemia ?-Potassium was running low because of low intake.  Intake improved.  Potassium level improved.  ? ?Acute anemia ?-No acute blood loss.  Hemoglobin level close to baseline. Adequate ferritin, folate and vitamin B12 level.   ?Recent Labs  ?  09/09/20 ?1023 05/01/21 ?1330 06/21/21 ?0845 06/22/21 ?0153 06/23/21 ?0100 06/24/21 ?0210 06/25/21 ?0130 06/26/21 ?8416  ?HGB  --    < >  --  9.6* 9.7* 10.5* 10.7* 10.6*  ?MCV  --    < >  --  100.0  97.3 95.8 95.8 96.7  ?VITAMINB12  --   --  562  --   --   --   --   --   ?FOLATE  --   --  16.1  --   --   --   --   --   ?FERRITIN 347*  --  477*  --   --   --   --   --   ?TIBC 310  --   209*  --   --   --   --   --   ?IRON 108  --  12*  --   --   --   --   --   ?RETICCTPCT  --   --  1.0  --   --   --   --   --   ? < > = values in this interval not displayed.  ? ?Anxiety and depression ?-Continue Wellbutrin and BuSpar ?  ?Chronic right shoulder pain ?-Outpatient follow-up.  Patient states she needs surgery. ? ? ?Wounds:  ?- ?Incision (Closed) 06/08/16 Abdomen (Active)  ?Date First Assessed/Time First Assessed: 06/08/16 0825   Location: Abdomen  ?  ?Assessments 06/08/2016 11:24 AM 06/08/2016 12:22 PM  ?Dressing Type Adhesive bandage Adhesive bandage  ?Dressing Clean;Dry;Intact Clean;Dry;Intact  ?Site / Wound Assessment Dressing in place / Unable to assess Clean;Dry  ?Drainage Amount None --  ?   ?No Linked orders to display  ?   ?Incision (Closed) 06/08/16 Vagina (Active)  ?Date First Assessed/Time First Assessed: 06/08/16 0825   Location: Vagina  ?  ?Assessments 06/08/2016 11:24 AM 06/08/2016 12:05 PM  ?Dressing Type Peripads Peripads  ?Dressing Clean;Dry;Intact --  ?Drainage Amount None None  ?   ?No Linked orders to display  ?   ?Incision - 4 Ports Abdomen 1: Right 2: Left 3: Umbilicus 4: Mid;Lower (Active)  ?Placement Date/Time: 06/08/16 0827   Location of Ports: Abdomen  Port: 1:  Location Orientation: Right  Port: 2:  Location Orientation: Left  Port: 3:  Location Orientation: Umbilicus  Port: 4:  Location Orientation: Mid;Lower  ?  ?Assessments 06/08/2016 11:28 AM  ?Port 1 Dressing Type Liquid skin adhesive;Adhesive bandage  ?Port 2 Dressing Type Adhesive bandage;Liquid skin adhesive  ?Port 3 Dressing Type Adhesive strips;Liquid skin adhesive  ?Port 4 Dressing Type Adhesive strips;Liquid skin adhesive  ?   ?No Linked orders to display  ?   ?Incision (Closed) 06/26/21 Buttocks Right (Active)  ?Date First Assessed/Time First Assessed: 06/26/21 1425   Location: Buttocks  Location Orientation: Right  Present on Admission: No  ?  ?Assessments 06/26/2021  2:25 PM 06/26/2021  8:00 PM  ?Dressing Type Adhesive  strips Gauze (Comment)  ?Dressing Clean, Dry, Intact Clean, Dry, Intact  ?Site / Wound Assessment -- Clean;Dry  ?   ?No Linked orders to display  ? ? ?Discharge Exam:  ? ?Vitals:  ? 06/26/21 1850 06/26/21 1900 06/27/21 0435 06/27/21 0754  ?BP: 140/83 (!) 145/77 124/61 136/67  ?Pulse:   73 75  ?Resp:   15 17  ?Temp:  (!) 97 ?F (36.1 ?C) 97.6 ?F (36.4 ?C) 98 ?F (36.7 ?C)  ?TempSrc:    Oral  ?SpO2:  96% 94% 96%  ?Weight:      ?Height:      ? ? ?Body mass index is 21.79 kg/m?.  ?General exam: Pleasant, elderly Caucasian female.  Not in physical distress. ?Skin: No rashes, lesions or ulcers. ?HEENT: Atraumatic, normocephalic, no obvious bleeding ?Lungs: Clear to auscultation bilaterally ?  CVS: Regular rate and rhythm, no murmur ?GI/Abd soft, tenderness improved, nondistended, bowel sound present.  Abdominal drain in place. ?CNS: Alert, awake, oriented x3 ?Psychiatry: Mood appropriate ?Extremities: No pedal, no calf tenderness ? ?Follow ups:  ? ? Follow-up Information   ? ? [name], NP Follow up.   ?Specialty: Internal Medicine ?Contact information: ?[place] [address] ?[phone] ? ? ?  ?  ? ? Rolm Bookbinder, MD. Daphane Shepherd on 07/14/2021.   ?Specialty: General Surgery ?Why: Your appointment is 07/14/21 arrive at 11:00am ?Bring photo ID and insurance information. ?Contact information: ?[address] ?[address] ?[place] [address] ?[phone] ? ? ?  ?  ? ?  ?  ? ?  ? ? ?Discharge Instructions:  ? ?Discharge Instructions   ? ? Call MD for:  difficulty breathing, headache or visual disturbances   Complete by: As directed ?  ? Call MD for:  extreme fatigue   Complete by: As directed ?  ? Call MD for:  hives   Complete by: As directed ?  ? Call MD for:  persistant dizziness or light-headedness   Complete by: As directed ?  ? Call MD for:  persistant nausea and vomiting   Complete by: As directed ?  ? Call MD for:  severe uncontrolled pain   Complete by: As directed ?  ? Call MD for:  temperature  >100.4   Complete by: As directed ?  ? Diet general   Complete by: As directed ?  ? Soft diet for next few days and gradually upgrade to regular consistency  ? Discharge instructions   Complete by: As directed ?  ? G

## 2021-06-27 NOTE — Progress Notes (Signed)
? ? ?Referring Physician(s): ?Jeanmarie Hubert ? ?Supervising Physician: Corrie Mckusick ? ?Patient Status:  [facility] - In-pt ? ?Chief Complaint: ? ?Abdominal pain/abscess ? ?Subjective: ?Patient doing fairly well today.  Has some mild abdominal discomfort.  States she will be going home today.  Denies fever, nausea or vomiting. ? ? ?Allergies: ?Demerol [meperidine], Kenalog [triamcinolone acetonide], and Adhesive [tape] ? ?Medications: ?Prior to Admission medications   ?Medication Sig Start Date End Date Taking? Authorizing Provider  ?acetaminophen (TYLENOL) 325 MG tablet Take 650 mg by mouth every 6 (six) hours as needed for moderate pain or headache.   Yes [provider]  ?amoxicillin-clavulanate (AUGMENTIN) 875-125 MG tablet Take 1 tablet by mouth 2 (two) times daily for 7 days. 06/27/21 07/04/21 Yes Dahal, Marlowe Aschoff, MD  ?buPROPion (WELLBUTRIN XL) 150 MG 24 hr tablet Take 2 tablets (300 mg total) by mouth daily. For depression. 03/24/21  Yes [name], NP  ?busPIRone (BUSPAR) 5 MG tablet TAKE 1 TABLET (5 MG TOTAL) BY MOUTH 2 (TWO) TIMES DAILY. FOR ANXIETY 04/19/21  Yes [name], NP  ?Cholecalciferol (VITAMIN D3 PO) Take 1 tablet by mouth daily. Unsure of mg   Yes [provider]  ?ELDERBERRY PO Take 5 mLs by mouth daily.   Yes [provider]  ?fluticasone (FLONASE) 50 MCG/ACT nasal spray Place 2 sprays into the nose daily as needed for allergies.    Yes [provider]  ?Misc Natural Products (NEURIVA PO) Take 1 capsule by mouth daily.   Yes [provider]  ?Multiple Vitamins-Minerals (MULTIVITAMIN WITH MINERALS) tablet Take 1 tablet by mouth daily. Centrum Silver for Women   Yes [provider]  ?Omega-3 Fatty Acids (FISH OIL) 500 MG CAPS Take 500 mg by mouth daily.   Yes [provider]  ?POTASSIUM PO Take 99 mg by mouth daily.   Yes [provider]  ?pravastatin (PRAVACHOL) 40 MG tablet TAKE 1 TABLET BY MOUTH EVERY DAY FOR  CHOLESTEROL ?Patient taking differently: Take 40 mg by mouth at bedtime. 05/17/21  Yes [name], NP  ?vitamin B-12 (CYANOCOBALAMIN) 1000 MCG tablet Take 1,000 mcg by mouth daily.   Yes [provider]  ?Capsaicin-Menthol (SALONPAS GEL-PATCH HOT EX) Apply topically. ?Patient not taking: Reported on 06/20/2021    [provider]  ?ciprofloxacin (CIPRO) 500 MG tablet Take 500 mg by mouth See admin instructions. Bid x 5 days ?Patient not taking: Reported on 06/20/2021 06/15/21   [provider]  ?HYDROcodone-acetaminophen (NORCO/VICODIN) 5-325 MG tablet Take 1 tablet by mouth every 6 (six) hours as needed for up to 3 days for moderate pain. 06/27/21 06/30/21  Terrilee Croak, MD  ?loperamide (IMODIUM) 2 MG capsule Take 1 capsule (2 mg total) by mouth 2 (two) times daily as needed for up to 5 days for diarrhea or loose stools. 06/27/21 07/02/21  Terrilee Croak, MD  ?saccharomyces boulardii (FLORASTOR) 250 MG capsule Take 1 capsule (250 mg total) by mouth 2 (two) times daily for 10 days. 06/27/21 07/07/21  Terrilee Croak, MD  ? ? ? ?Vital Signs: ?BP 136/67 (BP Location: Right Arm)   Pulse 75   Temp 98 ?F (36.7 ?C) (Oral)   Resp 17   Ht 5' (1.524 m)   Wt 111 lb 8.8 oz (50.6 kg)   SpO2 96%   BMI 21.79 kg/m?  ? ?Physical Exam awake, alert.  Left lower quadrant abdominal abscess drain intact, insertion site okay, mildly tender, output 60 cc feculent appearing fluid ? ?Imaging: ?CT ABDOMEN PELVIS  W CONTRAST ? ?Result Date: 06/26/2021 ?CLINICAL DATA:  Diverticulitis EXAM: CT ABDOMEN AND PELVIS WITH CONTRAST TECHNIQUE: Multidetector CT imaging of the abdomen and pelvis was performed using the standard protocol following bolus administration of intravenous contrast. RADIATION DOSE REDUCTION: This exam was performed according to the departmental dose-optimization program which includes automated exposure control, adjustment of the mA and/or kV according to patient size and/or use of iterative  reconstruction technique. CONTRAST:  58m OMNIPAQUE IOHEXOL 300 MG/ML  SOLN COMPARISON:  06/20/2021 FINDINGS: Lower chest: Small pericardial effusion has slightly increased in size though there is no CT evidence of cardiac tamponade. Cardiac size is within normal limits. Visualized lung bases are clear. Hepatobiliary: No focal liver abnormality is seen. No gallstones, gallbladder wall thickening, or biliary dilatation. Pancreas: Unremarkable Spleen: Unremarkable Adrenals/Urinary Tract: Adrenal glands are unremarkable. Kidneys are normal, without renal calculi, focal lesion, or hydronephrosis. Bladder is unremarkable. Stomach/Bowel: Since the prior examination, a left lower quadrant pigtail drainage catheter has been placed extending in the mid pelvis and completely evacuating the dominant gas and fluid collection within the pericolonic region adjacent to the sigmoid colon. There is persistent extensive circumferential bowel wall thickening and pericolonic inflammatory stranding involving the sigmoid colon in keeping with changes of acute diverticulitis. Background severe sigmoid diverticulosis. A residual deep pelvic loculated fluid collection is seen at axial image # 70/4 measuring 4.2 x 5.0 cm, mildly enlarged when compared to prior examination. This collection abuts the vaginal cuff. No new loculated intra-abdominal fluid collections are identified. The stomach and small bowel are unremarkable. The large bowel is otherwise unremarkable. No free intraperitoneal gas. Vascular/Lymphatic: No significant vascular findings are present. No enlarged abdominal or pelvic lymph nodes. Reproductive: Status post hysterectomy. No adnexal masses. Other: No abdominal wall hernia. Small focus of gas within the subcutaneous fat of the right lower quadrant anterior abdominal wall may relate to subcutaneous injection. Musculoskeletal: No acute bone abnormality. No lytic or blastic bone lesion. Osseous structures are age-appropriate.  IMPRESSION: Interval percutaneous drainage catheter placement within the dominant gas and fluid collection within the pericolonic region with complete evacuation of this collection. Persistent loculated fluid collection within the deep pelvis demonstrating slight interval increase in size, abutting the vaginal cuff. This now measures 5 cm in greatest dimension. Acute sigmoid diverticulitis. Background severe sigmoid diverticulosis. Slight interval increase in small pericardial effusion. No CT evidence of cardiac tamponade. Electronically Signed   By: AFidela SalisburyM.D.   On: 06/26/2021 03:22  ? ?CT ASPIRATION ? ?Result Date: 06/26/2021 ?INDICATION: Pelvic fluid collection EXAM: CT GUIDED DRAINAGE OF pelvic fluid collection TECHNIQUE: Multidetector CT imaging of the pelvis was performed following the standard protocol without IV contrast. RADIATION DOSE REDUCTION: This exam was performed according to the departmental dose-optimization program which includes automated exposure control, adjustment of the mA and/or kV according to patient size and/or use of iterative reconstruction technique. MEDICATIONS: Per EMR. ANESTHESIA/SEDATION: Moderate (conscious) sedation was employed during this procedure. A total of Versed 1 mg and Fentanyl 50 mcg was administered intravenously by the radiology nurse. Total intra-service moderate Sedation Time: 13 minutes. The patient's level of consciousness and vital signs were monitored continuously by radiology nursing throughout the procedure under my direct supervision. COMPLICATIONS: None immediate. TECHNIQUE: Informed written consent was obtained from the patient after a thorough discussion of the procedural risks, benefits and alternatives. All questions were addressed. Maximal Sterile Barrier Technique was utilized including caps, mask, sterile gowns, sterile gloves, sterile drape, hand hygiene and skin antiseptic. A timeout was performed  prior to the initiation of the procedure.  PROCEDURE: The patient was placed prone on the exam table. Limited CT of the pelvis was performed planning purposes. This again demonstrated a hypodense fluid collection in the dependent portion pelvis. Skin entry site was [facility]

## 2021-06-27 NOTE — Progress Notes (Signed)
? ?Subjective/Chief Complaint: ?Pt doing well ?IR asp cyst yesterday ? ? ? ?Objective: ?Vital signs in last 24 hours: ?Temp:  [97 ?F (36.1 ?C)-98 ?F (36.7 ?C)] 98 ?F (36.7 ?C) (04/22 0754) ?Pulse Rate:  [73-91] 75 (04/22 0754) ?Resp:  [12-22] 17 (04/22 0754) ?BP: (113-145)/(61-86) 136/67 (04/22 0754) ?SpO2:  [94 %-100 %] 96 % (04/22 0754) ?Last BM Date : 06/26/21 ? ?Intake/Output from previous day: ?04/21 0701 - 04/22 0700 ?In: 15  ?Out: 60 [Drains:60] ?Intake/Output this shift: ?No intake/output data recorded. ? ?PE: ? ?Constitutional: No acute distress, conversant, appears states age. ?Eyes: Anicteric sclerae, moist conjunctiva, no lid lag ?Lungs: Clear to auscultation bilaterally, normal respiratory effort ?CV: regular rate and rhythm, no murmurs, no peripheral edema, pedal pulses 2+ ?GI: Soft, no masses or hepatosplenomegaly, non-tender to palpation, dr feculent ?Skin: No rashes, palpation reveals normal turgor ?Psychiatric: appropriate judgment and insight, oriented to person, place, and time ? ? ?Lab Results:  ?Recent Labs  ?  06/25/21 ?0130 06/26/21 ?1594  ?WBC 2.8* 3.4*  ?HGB 10.7* 10.6*  ?HCT 31.9* 32.3*  ?PLT 335 382  ? ?BMET ?Recent Labs  ?  06/25/21 ?0130 06/26/21 ?5859  ?NA 140 135  ?K 2.9* 4.1  ?CL 106 106  ?CO2 27 24  ?GLUCOSE 94 114*  ?BUN <5* <5*  ?CREATININE 0.48 0.52  ?CALCIUM 8.4* 8.4*  ? ?PT/INR ?No results for input(s): LABPROT, INR in the last 72 hours. ?ABG ?No results for input(s): PHART, HCO3 in the last 72 hours. ? ?Invalid input(s): PCO2, PO2 ? ?Studies/Results: ?CT ABDOMEN PELVIS W CONTRAST ? ?Result Date: 06/26/2021 ?CLINICAL DATA:  Diverticulitis EXAM: CT ABDOMEN AND PELVIS WITH CONTRAST TECHNIQUE: Multidetector CT imaging of the abdomen and pelvis was performed using the standard protocol following bolus administration of intravenous contrast. RADIATION DOSE REDUCTION: This exam was performed according to the departmental dose-optimization program which includes automated exposure  control, adjustment of the mA and/or kV according to patient size and/or use of iterative reconstruction technique. CONTRAST:  1m OMNIPAQUE IOHEXOL 300 MG/ML  SOLN COMPARISON:  06/20/2021 FINDINGS: Lower chest: Small pericardial effusion has slightly increased in size though there is no CT evidence of cardiac tamponade. Cardiac size is within normal limits. Visualized lung bases are clear. Hepatobiliary: No focal liver abnormality is seen. No gallstones, gallbladder wall thickening, or biliary dilatation. Pancreas: Unremarkable Spleen: Unremarkable Adrenals/Urinary Tract: Adrenal glands are unremarkable. Kidneys are normal, without renal calculi, focal lesion, or hydronephrosis. Bladder is unremarkable. Stomach/Bowel: Since the prior examination, a left lower quadrant pigtail drainage catheter has been placed extending in the mid pelvis and completely evacuating the dominant gas and fluid collection within the pericolonic region adjacent to the sigmoid colon. There is persistent extensive circumferential bowel wall thickening and pericolonic inflammatory stranding involving the sigmoid colon in keeping with changes of acute diverticulitis. Background severe sigmoid diverticulosis. A residual deep pelvic loculated fluid collection is seen at axial image # 70/4 measuring 4.2 x 5.0 cm, mildly enlarged when compared to prior examination. This collection abuts the vaginal cuff. No new loculated intra-abdominal fluid collections are identified. The stomach and small bowel are unremarkable. The large bowel is otherwise unremarkable. No free intraperitoneal gas. Vascular/Lymphatic: No significant vascular findings are present. No enlarged abdominal or pelvic lymph nodes. Reproductive: Status post hysterectomy. No adnexal masses. Other: No abdominal wall hernia. Small focus of gas within the subcutaneous fat of the right lower quadrant anterior abdominal wall may relate to subcutaneous injection. Musculoskeletal: No acute  bone abnormality. No lytic  or blastic bone lesion. Osseous structures are age-appropriate. IMPRESSION: Interval percutaneous drainage catheter placement within the dominant gas and fluid collection within the pericolonic region with complete evacuation of this collection. Persistent loculated fluid collection within the deep pelvis demonstrating slight interval increase in size, abutting the vaginal cuff. This now measures 5 cm in greatest dimension. Acute sigmoid diverticulitis. Background severe sigmoid diverticulosis. Slight interval increase in small pericardial effusion. No CT evidence of cardiac tamponade. Electronically Signed   By: Fidela Salisbury M.D.   On: 06/26/2021 03:22  ? ?CT ASPIRATION ? ?Result Date: 06/26/2021 ?INDICATION: Pelvic fluid collection EXAM: CT GUIDED DRAINAGE OF pelvic fluid collection TECHNIQUE: Multidetector CT imaging of the pelvis was performed following the standard protocol without IV contrast. RADIATION DOSE REDUCTION: This exam was performed according to the departmental dose-optimization program which includes automated exposure control, adjustment of the mA and/or kV according to patient size and/or use of iterative reconstruction technique. MEDICATIONS: Per EMR. ANESTHESIA/SEDATION: Moderate (conscious) sedation was employed during this procedure. A total of Versed 1 mg and Fentanyl 50 mcg was administered intravenously by the radiology nurse. Total intra-service moderate Sedation Time: 13 minutes. The patient's level of consciousness and vital signs were monitored continuously by radiology nursing throughout the procedure under my direct supervision. COMPLICATIONS: None immediate. TECHNIQUE: Informed written consent was obtained from the patient after a thorough discussion of the procedural risks, benefits and alternatives. All questions were addressed. Maximal Sterile Barrier Technique was utilized including caps, mask, sterile gowns, sterile gloves, sterile drape, hand  hygiene and skin antiseptic. A timeout was performed prior to the initiation of the procedure. PROCEDURE: The patient was placed prone on the exam table. Limited CT of the pelvis was performed planning purposes. This again demonstrated a hypodense fluid collection in the dependent portion pelvis. Skin entry site was marked, the overlying skin was prepped and draped in a standard sterile fashion. Local analgesia was obtained % lidocaine. Using intermittent CT fluoroscopy, a 19 gauge Yueh catheter was advanced towards the pelvic fluid collection. There is immediate return of clear yellow serous fluid. Given lack of gross infection, no drain was placed. The fluid collection was completely decompressed a total of approximately 32 mL of clear serous fluid was aspirated. Postprocedure CT demonstrated appropriate resolution of the pelvic fluid collection. Catheter was removed. Sample was sent to the lab for microbiology analysis. A clean dressing was placed. The patient tolerated the procedure well without immediate complication. FINDINGS: See above. IMPRESSION: Successful CT-guided aspiration of pelvic fluid yielding 32 mL of clear yellow serous fluid. No drain placed. Sample sent for microbiology analysis. Electronically Signed   By: Albin Felling M.D.   On: 06/26/2021 15:03   ? ?Anti-infectives: ?Anti-infectives (From admission, onward)  ? ? Start     Dose/Rate Route Frequency Ordered Stop  ? 06/23/21 1000  vancomycin (VANCOREADY) IVPB 750 mg/150 mL  Status:  Discontinued       ? 750 mg ?150 mL/hr over 60 Minutes Intravenous Every 24 hours 06/22/21 0827 06/22/21 1404  ? 06/22/21 1500  piperacillin-tazobactam (ZOSYN) IVPB 3.375 g       ? 3.375 g ?12.5 mL/hr over 240 Minutes Intravenous Every 8 hours 06/22/21 1404    ? 06/22/21 0915  vancomycin (VANCOCIN) IVPB 1000 mg/200 mL premix       ? 1,000 mg ?200 mL/hr over 60 Minutes Intravenous  Once 06/22/21 0827 06/22/21 1125  ? 06/21/21 0600  ceFEPIme (MAXIPIME) 2 g in sodium  chloride 0.9 % 100 mL IVPB  Status:  Discontinued       ? 2 g ?200 mL/hr over 30 Minutes Intravenous Every 8 hours 06/20/21 2055 06/22/21 1404  ? 06/20/21 2200  piperacillin-tazobactam (ZOSYN) IVPB 3.375 g

## 2021-06-29 ENCOUNTER — Telehealth: Payer: Self-pay

## 2021-06-29 NOTE — Telephone Encounter (Signed)
Transition Care Management Follow-up Telephone Call ?Date of discharge and from where: [place] 06-27-21 Dx: acute sigmoid diverticulitis ?How have you been since you were released from the hospital? Doing pretty good  ?Any questions or concerns? No ? ?Items Reviewed: ?Did the pt receive and understand the discharge instructions provided? Yes  ?Medications obtained and verified? Yes  ?Other? No  ?Any new allergies since your discharge? No  ?Dietary orders reviewed? Yes ?Do you have support at home? Yes  ? ?Home Care and Equipment/Supplies: ?Were home health services ordered? no ?If so, what is the name of the agency? na  ?Has the agency set up a time to come to the patient's home? not applicable ?Were any new equipment or medical supplies ordered?  No ?What is the name of the medical supply agency? na ?Were you able to get the supplies/equipment? not applicable ?Do you have any questions related to the use of the equipment or supplies? No ? ?Functional Questionnaire: (I = Independent and D = Dependent) ?ADLs: I ? ?Bathing/Dressing- I ? ?Meal Prep- I ? ?Eating- I ? ?Maintaining continence- I ? ?Transferring/Ambulation- I ? ?Managing Meds- I ? ?Follow up appointments reviewed: ? ?PCP Hospital f/u appt confirmed? No- pt only wants to see Dr Carlis Abbott- pt needs to be seen by 07/10/21 -pt waiting call from office - pt has appt on 07/21/21 but that is outside the 14 day window  ?[facility] f/u appt confirmed? Yes  Scheduled to see Dr Donne Hazel on 07-14-21 @ 11am. ?Are transportation arrangements needed? No  ?If their condition worsens, is the pt aware to call PCP or go to the Emergency Dept.? Yes ?Was the patient provided with contact information for the PCP's office or ED? Yes ?Was to pt encouraged to call back with questions or concerns? Yes  ?

## 2021-06-29 NOTE — Telephone Encounter (Signed)
Since she is seeing surgery seems reasonable to wait to see PCP given admission reason.  ? ? ?

## 2021-07-01 ENCOUNTER — Other Ambulatory Visit: Payer: Self-pay | Admitting: General Surgery

## 2021-07-01 DIAGNOSIS — K651 Peritoneal abscess: Secondary | ICD-10-CM

## 2021-07-01 LAB — AEROBIC/ANAEROBIC CULTURE W GRAM STAIN (SURGICAL/DEEP WOUND): Culture: NO GROWTH

## 2021-07-01 NOTE — Telephone Encounter (Signed)
Called and spoke with patient she said that she was told due the cyst on he colon she will need to have this done every 5-7 years, pt will bring the name of the doctor that she will like to be referred to when she come to her appt  on the 07/21/21. ?

## 2021-07-06 ENCOUNTER — Telehealth: Payer: Self-pay | Admitting: Primary Care

## 2021-07-06 NOTE — Telephone Encounter (Signed)
Noted. Will place referral once we have her preferred MD's name.  ?

## 2021-07-06 NOTE — Telephone Encounter (Signed)
LVM for pt to rtn my call to schedule AWV with [facility]. Please schedule AWV if pt calls the office  

## 2021-07-06 NOTE — Telephone Encounter (Signed)
Received call from patient she does not want to schedule AWV  ?

## 2021-07-08 NOTE — Progress Notes (Signed)
? ?Office Visit Note ? ?Patient: Tracy Estrada             ?Date of Birth: February 17, 1951           ?MRN: [id number]             ?PCP: [name], NP ?Referring: [name], NP ?Visit Date: 07/22/2021 ?Occupation: '@GUAROCC'$ @ ? ?Subjective:  ?Right shoulder pain ? ?History of Present Illness: Suezette Lafave is a 71 y.o. female with history of osteoarthritis, degenerative disc disease and osteoporosis.  She was hospitalized in April due to diverticulitis, abscess rupture.  She states that the abscess was drained and she has colostomy bag now.  She will need a colonoscopy after the colostomy bag is out.  She has been experiencing pain and discomfort in her right shoulder since the colostomy.  She currently does not have any discomfort in her hands, knees or her feet.  She denies any neck or lower back pain.  She is on Prolia injections for osteoporosis by her PCP. ? ? ?Activities of Daily Living:  ?Patient reports morning stiffness for 32mnutes.   ?Patient Denies nocturnal pain.  ?Difficulty dressing/grooming: Denies ?Difficulty climbing stairs: Denies ?Difficulty getting out of chair: Denies ?Difficulty using hands for taps, buttons, cutlery, and/or writing: Denies ? ?Review of Systems  ?Constitutional:  Positive for fatigue.  ?HENT:  Negative for mouth sores and mouth dryness.   ?Eyes:  Negative for dryness.  ?Respiratory:  Negative for shortness of breath.   ?Cardiovascular:  Negative for chest pain and palpitations.  ?Gastrointestinal:  Negative for abdominal pain.  ?Endocrine: Negative for increased urination.  ?Genitourinary:  Negative for painful urination.  ?Musculoskeletal:  Positive for joint pain, joint pain and morning stiffness. Negative for myalgias and myalgias.  ?Skin:  Negative for color change, rash and sensitivity to sunlight.  ?Allergic/Immunologic: Negative for susceptible to infections.  ?Neurological:  Negative for dizziness, headaches and weakness.  ?Hematological:  Negative for swollen  glands.  ?Psychiatric/Behavioral:  Positive for depressed mood. Negative for sleep disturbance. The patient is not nervous/anxious.   ? ?PMFS History:  ?Patient Active Problem List  ? Diagnosis Date Noted  ? Memory changes 07/21/2021  ? [place]abdominal abscess [place] 06/21/2021  ? Normocytic anemia 06/21/2021  ? Right pelvic adnexal fluid collection 06/21/2021  ? Acute diverticulitis 06/20/2021  ? Neutropenia ([facility] 01/28/2020  ? Chronic right shoulder pain 01/28/2020  ? Foul smelling urine 09/04/2019  ? Medicare annual wellness visit, subsequent 01/25/2019  ? Hyperlipidemia 01/25/2019  ? Osteoporosis 01/25/2019  ? Rash and nonspecific skin eruption 04/13/2018  ? Fibromyalgia 09/16/2015  ? Anxiety and depression 09/16/2015  ? Insomnia 09/16/2015  ? Genetic testing 03/21/2015  ? Family history of breast cancer in sister 02/27/2015  ? Family history of ovarian cancer 02/27/2015  ? Family history of colon cancer 02/27/2015  ?  ?Past Medical History:  ?Diagnosis Date  ? Achilles bursitis or tendinitis   ? Anxiety state, unspecified   ? Arthritis   ? Closed fracture of unspecified part of fibula   ? Depressive disorder, not elsewhere classified   ? Diverticulosis of colon (without mention of hemorrhage)   ? Edema   ? Fibromyalgia   ? Insomnia, unspecified   ? Lumbago   ? Mass of left ovary 06/01/2016  ? Meniere's disease 1986  ? MVP (mitral valve prolapse)   ? Other intra-abdominal and pelvic swelling, mass and lump 06/01/2016  ? Pain in joint, site unspecified   ? Peptic ulcer   ?  no testing  ? Pure hypercholesterolemia   ? TSH elevation   ?  ?Family History  ?Problem Relation Age of Onset  ? Hyperlipidemia Mother   ? Hypertension Mother   ? Colon polyps Mother   ? Coronary artery disease Father   ?      AMI age 48  ? Heart disease Father 16  ?     AMI x 2; first AMI in 30s  ? Breast cancer Sister 16  ?     with metastasis to bone; smoker  ? Cancer Sister 60  ?     breast cancer  ? Colon cancer Maternal Uncle   ?     dx.  >50  ? Colon cancer Paternal Aunt   ?     dx. 73s  ? Heart attack Paternal Uncle   ? Stroke Maternal Grandmother   ? Alzheimer's disease Paternal Grandmother   ? Heart disease Paternal Grandfather   ? Heart attack Paternal Grandfather   ? Healthy Daughter   ? Healthy Daughter   ? Breast cancer Cousin   ? Ovarian cancer Cousin   ?     dx. late 70s/dx. 60s  ? Lung cancer Cousin   ?     dx. 40s  ? Stomach cancer Other   ? ?Past Surgical History:  ?Procedure Laterality Date  ? ABDOMINAL HYSTERECTOMY  03/08/1980  ? fibroids; ovaries intact.  ? BACK SURGERY    ? x2 Cervical and lumbar  ? CYSTOSCOPY N/A 06/08/2016  ? Procedure: CYSTOSCOPY;  Surgeon: Will Bonnet, MD;  Location: [facility] ORS;  Service: Gynecology;  Laterality: N/A;  ? IR RADIOLOGIST EVAL & MGMT  07/10/2021  ? LAPAROSCOPIC LYSIS OF ADHESIONS  06/08/2016  ? Procedure: LAPAROSCOPIC LYSIS OF ADHESIONS;  Surgeon: Will Bonnet, MD;  Location: [facility] OR[facility];  Service: Gynecology;;  ? LAPAROSCOPIC SALPINGO OOPHERECTOMY Left 06/08/2016  ? Procedure: LAPAROSCOPIC SALPINGO OOPHORECTOMY;  Surgeon: Will Bonnet, MD;  Location: [facility] OR[facility];  Service: Gynecology;  Laterality: Left;  ? OOPHORECTOMY    ? left ovary  ? SALPINGECTOMY    ? left  ? SPINE SURGERY    ? lumbar spine 11/2015; cervical spine 2009. [facility].  ? ?Social History  ? ?Social History Narrative  ? Marital status:  Married x 14 years; second marriage;happily married,husband has PTSD.  ?   Children: 2 daughters [name], [name]); 7 grandchildren; 2 gg  ?    Lives: with husband, dog/beagle  ?    Employment: retired since 2013; Network engineer  ?    Tobacco: never  ?    Alcohol: weekends; 1-2 glasses of wine per week or beer.  ?    Drugs:  None  ?    Exercise: none in 2018  ?    Seatbelt: 100%  ?    Guns: unloaded.   ?  Caffeine not every day. 7 grandchildren and 2 GG. Organ donor NO.  ?    ADLs: independent with ADLs; drives  ?    Advanced Directives: YES; FULL CODE no prolonged measures.  ? ?Immunization History  ?Administered  Date(s) Administered  ? Fluad Quad(high Dose 65+) 01/22/2019, 01/09/2020  ? Influenza,inj,Quad PF,6+ Mos 11/06/2014  ? Influenza-Unspecified 01/07/2016, 12/13/2016, 11/12/2020  ? PFIZER(Purple Top)SARS-COV-2 Vaccination 04/19/2019, 05/08/2019  ? Pneumococcal Conjugate-13 11/06/2014  ? Pneumococcal Polysaccharide-23 04/07/2016  ? Tdap 08/10/2011  ? Zoster Recombinat (Shingrix) 05/15/2018, 09/16/2018, 01/22/2019  ? Zoster, Live 09/06/2011  ?  ? ?Objective: ?Vital Signs: BP 113/69 (BP Location: Left Arm,  Patient Position: Sitting, Cuff Size: Small)   Pulse 73   Resp 12   Ht 5' (1.524 m)   Wt 109 lb 9.6 oz (49.7 kg)   BMI 21.40 kg/m?   ? ?Physical Exam ?Vitals and nursing note reviewed.  ?Constitutional:   ?   Appearance: She is well-developed.  ?HENT:  ?   Head: Normocephalic and atraumatic.  ?Eyes:  ?   Conjunctiva/sclera: Conjunctivae normal.  ?Cardiovascular:  ?   Rate and Rhythm: Normal rate and regular rhythm.  ?   Heart sounds: Normal heart sounds.  ?Pulmonary:  ?   Effort: Pulmonary effort is normal.  ?   Breath sounds: Normal breath sounds.  ?Abdominal:  ?   General: Bowel sounds are normal.  ?   Palpations: Abdomen is soft.  ?Musculoskeletal:  ?   Cervical back: Normal range of motion.  ?Lymphadenopathy:  ?   Cervical: No cervical adenopathy.  ?Skin: ?   General: Skin is warm and dry.  ?   Capillary Refill: Capillary refill takes less than 2 seconds.  ?Neurological:  ?   Mental Status: She is alert and oriented to person, place, and time.  ?Psychiatric:     ?   Behavior: Behavior normal.  ?  ? ?Musculoskeletal Exam: C-spine was in good range of motion.  She had no tenderness over cervical or lumbar spine.  She had good abduction and forward flexion of her shoulders.  She had limited internal rotation of her shoulders.  There was no warmth or effusion.  Joints with good range of motion.  There was no tenderness over wrist joints.  Bilateral PIP and DIP thickening was noted.  Hip joints and knee joints with  good range of motion.  She had no tenderness over ankles or MTPs. ? ?CDAI Exam: ?CDAI Score: -- ?Patient Global: --; Provider Global: -- ?Swollen: --; Tender: -- ?Joint Exam 07/22/2021  ? ?No joint exam has

## 2021-07-10 ENCOUNTER — Other Ambulatory Visit: Payer: Self-pay | Admitting: General Surgery

## 2021-07-10 ENCOUNTER — Ambulatory Visit
Admission: RE | Admit: 2021-07-10 | Discharge: 2021-07-10 | Disposition: A | Discharge status: Home / Self Care | Payer: Medicare Other | Source: Ambulatory Visit | Attending: General Surgery | Admitting: General Surgery

## 2021-07-10 ENCOUNTER — Ambulatory Visit
Admission: RE | Admit: 2021-07-10 | Discharge: 2021-07-10 | Disposition: A | Discharge status: Home / Self Care | Payer: Medicare Other | Source: Ambulatory Visit | Attending: Radiology | Admitting: Radiology

## 2021-07-10 DIAGNOSIS — K6389 Other specified diseases of intestine: Secondary | ICD-10-CM | POA: Diagnosis not present

## 2021-07-10 DIAGNOSIS — K3189 Other diseases of stomach and duodenum: Secondary | ICD-10-CM | POA: Diagnosis not present

## 2021-07-10 DIAGNOSIS — Z90710 Acquired absence of both cervix and uterus: Secondary | ICD-10-CM | POA: Diagnosis not present

## 2021-07-10 DIAGNOSIS — K651 Peritoneal abscess: Secondary | ICD-10-CM

## 2021-07-10 DIAGNOSIS — K5780 Diverticulitis of intestine, part unspecified, with perforation and abscess without bleeding: Secondary | ICD-10-CM | POA: Diagnosis not present

## 2021-07-10 DIAGNOSIS — Z4682 Encounter for fitting and adjustment of non-vascular catheter: Secondary | ICD-10-CM | POA: Diagnosis not present

## 2021-07-10 DIAGNOSIS — K5732 Diverticulitis of large intestine without perforation or abscess without bleeding: Secondary | ICD-10-CM | POA: Diagnosis not present

## 2021-07-10 DIAGNOSIS — K632 Fistula of intestine: Secondary | ICD-10-CM | POA: Diagnosis not present

## 2021-07-10 HISTORY — PX: IR RADIOLOGIST EVAL & MGMT: IMG5224

## 2021-07-10 MED ORDER — IOPAMIDOL (ISOVUE-300) INJECTION 61%
100.0000 mL | Freq: Once | INTRAVENOUS | Status: AC | PRN
Start: 2021-07-10 — End: 2021-07-10
  Administered 2021-07-10: 100 mL via INTRAVENOUS

## 2021-07-10 NOTE — Progress Notes (Signed)
? ?Referring Physician(s): ?Rolm Bookbinder, MD ? ?History of present illness: ?71 year old female with history of recent acute diverticulitis complicated by perforation requiring CT guided left lower quadrant drain placement on 06/21/21 followed by CT guided transgluteal aspiration of adjacent pelvic fluid collection on 06/26/21.  She presents for routine drain follow up, accompanied by her husband.  They report approximately 15 mL of opaque brown/tan output per day.  They have kept the drain to bulb suction, and have been flushing 5 mL saline per day.  No recent fevers, chills, abdominal pain, shortness of breath. ? ?Past Medical History:  ?Diagnosis Date  ? Achilles bursitis or tendinitis   ? Anxiety state, unspecified   ? Arthritis   ? Closed fracture of unspecified part of fibula   ? Depressive disorder, not elsewhere classified   ? Diverticulosis of colon (without mention of hemorrhage)   ? Edema   ? Fibromyalgia   ? Insomnia, unspecified   ? Lumbago   ? Mass of left ovary 06/01/2016  ? Meniere's disease 1986  ? MVP (mitral valve prolapse)   ? Other intra-abdominal and pelvic swelling, mass and lump 06/01/2016  ? Pain in joint, site unspecified   ? Peptic ulcer   ? no testing  ? Pure hypercholesterolemia   ? TSH elevation   ? ? ?Past Surgical History:  ?Procedure Laterality Date  ? ABDOMINAL HYSTERECTOMY  03/08/1980  ? fibroids; ovaries intact.  ? BACK SURGERY    ? x2 Cervical and lumbar  ? CYSTOSCOPY N/A 06/08/2016  ? Procedure: CYSTOSCOPY;  Surgeon: Will Bonnet, MD;  Location: [facility] OR[facility];  Service: Gynecology;  Laterality: N/A;  ? LAPAROSCOPIC LYSIS OF ADHESIONS  06/08/2016  ? Procedure: LAPAROSCOPIC LYSIS OF ADHESIONS;  Surgeon: Will Bonnet, MD;  Location: [facility];  Service: Gynecology;;  ? LAPAROSCOPIC SALPINGO OOPHERECTOMY Left 06/08/2016  ? Procedure: LAPAROSCOPIC SALPINGO OOPHORECTOMY;  Surgeon: Will Bonnet, MD;  Location: [facility];  Service: Gynecology;  Laterality: Left;  ? OOPHORECTOMY    ?  left ovary  ? SALPINGECTOMY    ? left  ? SPINE SURGERY    ? lumbar spine 11/2015; cervical spine 2009. [facility].  ? ? ?Allergies: ?Demerol [meperidine], Kenalog [triamcinolone acetonide], and Adhesive [tape] ? ?Medications: ?Prior to Admission medications   ?Medication Sig Start Date End Date Taking? Authorizing Provider  ?acetaminophen (TYLENOL) 325 MG tablet Take 650 mg by mouth every 6 (six) hours as needed for moderate pain or headache.    [provider]  ?buPROPion (WELLBUTRIN XL) 150 MG 24 hr tablet Take 2 tablets (300 mg total) by mouth daily. For depression. 03/24/21   [name], NP  ?busPIRone (BUSPAR) 5 MG tablet TAKE 1 TABLET (5 MG TOTAL) BY MOUTH 2 (TWO) TIMES DAILY. FOR ANXIETY 04/19/21   [name], NP  ?Cholecalciferol (VITAMIN D3 PO) Take 1 tablet by mouth daily. Unsure of mg    [provider]  ?ELDERBERRY PO Take 5 mLs by mouth daily.    [provider]  ?fluticasone (FLONASE) 50 MCG/ACT nasal spray Place 2 sprays into the nose daily as needed for allergies.     [provider]  ?Misc Natural Products (NEURIVA PO) Take 1 capsule by mouth daily.    [provider]  ?Multiple Vitamins-Minerals (MULTIVITAMIN WITH MINERALS) tablet Take 1 tablet by mouth daily. Centrum Silver for Women    [provider]  ?Omega-3 Fatty Acids (FISH OIL) 500 MG CAPS Take 500 mg by mouth daily.  [provider]  ?POTASSIUM PO Take 99 mg by mouth daily.    [provider]  ?pravastatin (PRAVACHOL) 40 MG tablet TAKE 1 TABLET BY MOUTH EVERY DAY FOR CHOLESTEROL ?Patient taking differently: Take 40 mg by mouth at bedtime. 05/17/21   [name], NP  ?vitamin B-12 (CYANOCOBALAMIN) 1000 MCG tablet Take 1,000 mcg by mouth daily.    [provider]  ?  ? ?Family History  ?Problem Relation Age of Onset  ? Hyperlipidemia Mother   ? Hypertension Mother   ? Colon polyps Mother   ? Coronary artery disease Father   ?      AMI age 77  ?  Heart disease Father 61  ?     AMI x 2; first AMI in 31s  ? Breast cancer Sister 51  ?     with metastasis to bone; smoker  ? Cancer Sister 38  ?     breast cancer  ? Colon cancer Maternal Uncle   ?     dx. >50  ? Colon cancer Paternal Aunt   ?     dx. 65s  ? Heart attack Paternal Uncle   ? Stroke Maternal Grandmother   ? Alzheimer's disease Paternal Grandmother   ? Heart disease Paternal Grandfather   ? Heart attack Paternal Grandfather   ? Healthy Daughter   ? Healthy Daughter   ? Breast cancer Cousin   ? Ovarian cancer Cousin   ?     dx. late 70s/dx. 60s  ? Lung cancer Cousin   ?     dx. 64s  ? Stomach cancer Other   ? ? ?Social History  ? ?Socioeconomic History  ? Marital status: Married  ?  Spouse name: Meko Bellanger  ? Number of children: 2  ? Years of education: Not on file  ? Highest education level: Not on file  ?Occupational History  ? Occupation: retired  ?  Comment: Network engineer  ?Tobacco Use  ? Smoking status: Never  ? Smokeless tobacco: Never  ?Vaping Use  ? Vaping Use: Never used  ?Substance and Sexual Activity  ? Alcohol use: Yes  ?  Alcohol/week: 0.0 standard drinks  ?  Comment: 3-4 beers per week or wine  ? Drug use: No  ? Sexual activity: Yes  ?  Birth control/protection: Surgical  ?Other Topics Concern  ? Not on file  ?Social History Narrative  ? Marital status:  Married x 14 years; second marriage;happily married,husband has PTSD.  ?   Children: 2 daughters [name], [name]); 7 grandchildren; 2 gg  ?    Lives: with husband, dog/beagle  ?    Employment: retired since 2013; Network engineer  ?    Tobacco: never  ?    Alcohol: weekends; 1-2 glasses of wine per week or beer.  ?    Drugs:  None  ?    Exercise: none in 2018  ?    Seatbelt: 100%  ?    Guns: unloaded.   ?  Caffeine not every day. 7 grandchildren and 2 GG. Organ donor NO.  ?    ADLs: independent with ADLs; drives  ?    Advanced Directives: YES; FULL CODE no prolonged measures.  ? ?Social Determinants of Health  ? ?Financial Resource Strain: Not on file   ?Food Insecurity: Not on file  ?Transportation Needs: Not on file  ?Physical Activity: Not on file  ?Stress: Not on file  ?Social Connections: Not on file  ? ? ? ?Vital  Signs: ?There were no vitals taken for this visit. ? ?Physical Exam ?Constitutional:   ?   General: She is not in acute distress. ?HENT:  ?   Head: Normocephalic.  ?   Mouth/Throat:  ?   Mouth: Mucous membranes are moist.  ?Eyes:  ?   General: No scleral icterus. ?Pulmonary:  ?   Effort: Pulmonary effort is normal.  ?Abdominal:  ?   General: There is no distension.  ?   Comments: LLQ drain in place, dressing clean, dry, and intact.  ?Skin: ?   General: Skin is warm and dry.  ?Neurological:  ?   Mental Status: She is alert and oriented to person, place, and time.  ? ? ?Imaging: ?CT AP 07/10/21 ? ?Abscess nearly resolved. ? ?07/10/21: ? ?Persistent colonic fistula. ? ?Labs: ? ?CBC: ?Recent Labs  ?  06/23/21 ?0100 06/24/21 ?0210 06/25/21 ?0130 06/26/21 ?6283  ?WBC 4.2 2.9* 2.8* 3.4*  ?HGB 9.7* 10.5* 10.7* 10.6*  ?HCT 28.5* 31.7* 31.9* 32.3*  ?PLT 286 287 335 382  ? ? ?COAGS: ?Recent Labs  ?  06/20/21 ?1417  ?INR 1.3*  ? ? ?BMP: ?Recent Labs  ?  06/23/21 ?0100 06/24/21 ?0210 06/25/21 ?0130 06/26/21 ?1517  ?NA 139 141 140 135  ?K 3.9 3.2* 2.9* 4.1  ?CL 106 107 106 106  ?CO2 '23 26 27 24  '$ ?GLUCOSE 79 103* 94 114*  ?BUN 8 <5* <5* <5*  ?CALCIUM 7.9* 8.0* 8.4* 8.4*  ?CREATININE 0.55 0.47 0.48 0.52  ?GFRNONAA >60 >60 >60 >60  ? ? ?LIVER FUNCTION TESTS: ?Recent Labs  ?  03/24/21 ?1159 06/20/21 ?1417 06/21/21 ?0500  ?BILITOT 0.4 0.8 0.8  ?AST 17 15 12*  ?ALT '15 11 9  '$ ?ALKPHOS 46 50 43  ?PROT 7.4 7.3 6.0*  ?ALBUMIN 4.5 3.3* 2.6*  ? ? ?Assessment and Plan: ?71 year old female with history of recent acute diverticulitis complicated by perforation requiring CT guided left lower quadrant drain placement on 06/21/21 followed by CT guided transgluteal aspiration of adjacent pelvic fluid collection on 06/26/21.  CT today demonstrates near complete resolution of abscess,  however drain injection demonstrates colonic fistula. ? ?-switch from bulb suction to bag drainage ?-cease drain flushing ?-arrange for repeat CT/injection/Drain Clinic follow up in 1 month ? ?Electronical

## 2021-07-14 DIAGNOSIS — K5720 Diverticulitis of large intestine with perforation and abscess without bleeding: Secondary | ICD-10-CM | POA: Diagnosis not present

## 2021-07-21 ENCOUNTER — Ambulatory Visit (INDEPENDENT_AMBULATORY_CARE_PROVIDER_SITE_OTHER): Payer: Medicare Other | Admitting: Primary Care

## 2021-07-21 ENCOUNTER — Encounter: Payer: Self-pay | Admitting: Primary Care

## 2021-07-21 DIAGNOSIS — F419 Anxiety disorder, unspecified: Secondary | ICD-10-CM | POA: Diagnosis not present

## 2021-07-21 DIAGNOSIS — K651 Peritoneal abscess: Secondary | ICD-10-CM

## 2021-07-21 DIAGNOSIS — F32A Depression, unspecified: Secondary | ICD-10-CM

## 2021-07-21 DIAGNOSIS — R413 Other amnesia: Secondary | ICD-10-CM

## 2021-07-21 NOTE — Assessment & Plan Note (Signed)
Recent hospital admission for diverticulitis in April 2023. ?Hospital notes, labs, imaging reviewed.  ? ?Reviewed office notes from IR in May 2023. ? ?Drain and surgical site do not appear infected.  ?She appears well.  ? ?Follow up with IR in early June 2023.  ?She will need repeat colonoscopy later this year, she will notify when ready.  ?

## 2021-07-21 NOTE — Progress Notes (Signed)
? ?Subjective:  ? ? Patient ID: Tracy Estrada, female    DOB: 02/26/1951, 71 y.o.   MRN: [id number] ? ?HPI ? ?Tracy Estrada is a very pleasant 71 y.o. female with a history of fibromyalgia, insomnia, hyperlipidemia, osteoporosis, neutropenia, intra-abdominal abscess, acute diverticulitis who presents today for hospital follow up.  Her daughter joins [place] today.  She would also like to discuss memory changes ? ?She presented to [facility] ED on 06/20/2021 for a 1 week history of abdominal pain with nausea and no vomiting, fatigue.  She was treated at urgent care on 06/15/2021 for acute cystitis, prescribed ciprofloxacin, urine culture positive for Klebsiella.  Patient took 2 to 3 days of the course total.  Work-up in the ED consisted of sigmoid diverticulitis with intramural abscess formation with possible underlying malignancy.  She was admitted for further treatment and evaluation. ? ?During her hospital stay she was treated with IV antibiotics, cefepime and metronidazole.  General surgery consulted who recommended IR consult for drain placement, n.p.o./bowel rest.  Aspirated contents from IR drain placement through gram-positive cocci, antibiotics were changed to Zosyn.  She underwent repeat CT abdomen pelvis which showed improved fluid collection with drain in place, persistent deep pelvic fluid collection increased.  ? ?Fortunately she did not require surgical intervention so she was discharged home on 06/27/2021 with recommendations for general surgery outpatient follow-up, Augmentin twice daily for another 7 days, probiotics, GI follow-up for colonoscopy.  It was also recommended she undergo follow-up pelvic/transvaginal ultrasound in 6 to 12 months, however, it was noted that IR was able to drain clear yellow fluid from the site ? ?Since her discharge home she has followed up with IR for routine drain care.  She has noticed 15 mils of opaque brown/tan output per day.  She underwent CT scan that day which demonstrated near  complete resolution of abscess, however drain injection demonstrated colonic fistula.  She was switched from bulb suction to bag drainage.  She is due for repeat CT/injection/drain follow-up in early June 2023. ? ?She visited her surgeon last week, was told that she may need surgery if the bag drain is ineffective for complete healing of her abscess.  ? ?She's feeling much stronger. Appetite has improved. She has noticed some constipation, she will start Colace soon. She will need a colonoscopy later this year, depending on if she needs surgery. Her bag drainage is tan and is less and less in quantity.  ? ?She would also like to mention memory changes. Chronic for several months. She's driven down the wrong side of the road, forgotten her credit card at home, has left her credit card at the store, she will pray and lose her train of thought, cannot remember a song she sang at church. She turned a coffee cup upside down before trying to filling her cup with coffee.  ? ?She has a family history of dementia in both parents. She overall feels that her depression and anxiety are under control since she is on bupropion XL 300 mg daily. ? ? ? ? ?Review of Systems  ?Respiratory:  Negative for shortness of breath.   ?Cardiovascular:  Negative for chest pain.  ?Gastrointestinal:  Negative for abdominal pain, constipation and nausea.  ?Neurological:  Negative for dizziness.  ?Psychiatric/Behavioral:  Positive for confusion. The patient is not nervous/anxious.   ? ?   ? ? ?Past Medical History:  ?Diagnosis Date  ? Achilles bursitis or tendinitis   ? Anxiety state, unspecified   ? Arthritis   ?  Closed fracture of unspecified part of fibula   ? Depressive disorder, not elsewhere classified   ? Diverticulosis of colon (without mention of hemorrhage)   ? Edema   ? Fibromyalgia   ? Insomnia, unspecified   ? Lumbago   ? Mass of left ovary 06/01/2016  ? Meniere's disease 1986  ? MVP (mitral valve prolapse)   ? Other intra-abdominal and  pelvic swelling, mass and lump 06/01/2016  ? Pain in joint, site unspecified   ? Peptic ulcer   ? no testing  ? Pure hypercholesterolemia   ? TSH elevation   ? ? ?Social History  ? ?Socioeconomic History  ? Marital status: Married  ?  Spouse name: Aryel Edelen  ? Number of children: 2  ? Years of education: Not on file  ? Highest education level: Not on file  ?Occupational History  ? Occupation: retired  ?  Comment: Network engineer  ?Tobacco Use  ? Smoking status: Never  ? Smokeless tobacco: Never  ?Vaping Use  ? Vaping Use: Never used  ?Substance and Sexual Activity  ? Alcohol use: Yes  ?  Alcohol/week: 0.0 standard drinks  ?  Comment: 3-4 beers per week or wine  ? Drug use: No  ? Sexual activity: Yes  ?  Birth control/protection: Surgical  ?Other Topics Concern  ? Not on file  ?Social History Narrative  ? Marital status:  Married x 14 years; second marriage;happily married,husband has PTSD.  ?   Children: 2 daughters [name], [name]); 7 grandchildren; 2 gg  ?    Lives: with husband, dog/beagle  ?    Employment: retired since 2013; Network engineer  ?    Tobacco: never  ?    Alcohol: weekends; 1-2 glasses of wine per week or beer.  ?    Drugs:  None  ?    Exercise: none in 2018  ?    Seatbelt: 100%  ?    Guns: unloaded.   ?  Caffeine not every day. 7 grandchildren and 2 GG. Organ donor NO.  ?    ADLs: independent with ADLs; drives  ?    Advanced Directives: YES; FULL CODE no prolonged measures.  ? ?Social Determinants of Health  ? ?Financial Resource Strain: Not on file  ?Food Insecurity: Not on file  ?Transportation Needs: Not on file  ?Physical Activity: Not on file  ?Stress: Not on file  ?Social Connections: Not on file  ?Intimate Partner Violence: Not on file  ? ? ?Past Surgical History:  ?Procedure Laterality Date  ? ABDOMINAL HYSTERECTOMY  03/08/1980  ? fibroids; ovaries intact.  ? BACK SURGERY    ? x2 Cervical and lumbar  ? CYSTOSCOPY N/A 06/08/2016  ? Procedure: CYSTOSCOPY;  Surgeon: Will Bonnet, MD;  Location: [facility] ORS;   Service: Gynecology;  Laterality: N/A;  ? IR RADIOLOGIST EVAL & MGMT  07/10/2021  ? LAPAROSCOPIC LYSIS OF ADHESIONS  06/08/2016  ? Procedure: LAPAROSCOPIC LYSIS OF ADHESIONS;  Surgeon: Will Bonnet, MD;  Location: [facility] ORS;  Service: Gynecology;;  ? LAPAROSCOPIC SALPINGO OOPHERECTOMY Left 06/08/2016  ? Procedure: LAPAROSCOPIC SALPINGO OOPHORECTOMY;  Surgeon: Will Bonnet, MD;  Location: [facility] ORS;  Service: Gynecology;  Laterality: Left;  ? OOPHORECTOMY    ? left ovary  ? SALPINGECTOMY    ? left  ? SPINE SURGERY    ? lumbar spine 11/2015; cervical spine 2009. [facility].  ? ? ?Family History  ?Problem Relation Age of Onset  ? Hyperlipidemia Mother   ? Hypertension Mother   ?  Colon polyps Mother   ? Coronary artery disease Father   ?      AMI age 98  ? Heart disease Father 67  ?     AMI x 2; first AMI in 61s  ? Breast cancer Sister 19  ?     with metastasis to bone; smoker  ? Cancer Sister 71  ?     breast cancer  ? Colon cancer Maternal Uncle   ?     dx. >50  ? Colon cancer Paternal Aunt   ?     dx. 34s  ? Heart attack Paternal Uncle   ? Stroke Maternal Grandmother   ? Alzheimer's disease Paternal Grandmother   ? Heart disease Paternal Grandfather   ? Heart attack Paternal Grandfather   ? Healthy Daughter   ? Healthy Daughter   ? Breast cancer Cousin   ? Ovarian cancer Cousin   ?     dx. late 70s/dx. 60s  ? Lung cancer Cousin   ?     dx. 64s  ? Stomach cancer Other   ? ? ?Allergies  ?Allergen Reactions  ? Demerol [Meperidine] Nausea And Vomiting  ? Kenalog [Triamcinolone Acetonide] Other (See Comments)  ?  Cause vertigo/dizziness  ? Adhesive [Tape] Rash and Other (See Comments)  ?  PAPER TAPE OK  ? ? ?Current Outpatient Medications on File Prior to Visit  ?Medication Sig Dispense Refill  ? acetaminophen (TYLENOL) 325 MG tablet Take 650 mg by mouth every 6 (six) hours as needed for moderate pain or headache.    ? buPROPion (WELLBUTRIN XL) 150 MG 24 hr tablet Take 2 tablets (300 mg total) by mouth daily. For depression.  180 tablet 3  ? busPIRone (BUSPAR) 5 MG tablet TAKE 1 TABLET (5 MG TOTAL) BY MOUTH 2 (TWO) TIMES DAILY. FOR ANXIETY 180 tablet 1  ? Cholecalciferol (VITAMIN D3 PO) Take 1 tablet by mouth daily. Uns

## 2021-07-21 NOTE — Assessment & Plan Note (Signed)
Initially thought to be secondary to anxiety/depression, however her odd behavior is suspicious for cognitive changes. ? ?Referral placed to neurology for further evaluation and testing. ?

## 2021-07-21 NOTE — Patient Instructions (Signed)
You will be contacted regarding your referral to neurology.  Please let us know if you have not been contacted within two weeks.  ? ?Follow-up with interventional radiology and the surgeon as scheduled. ? ?It was a pleasure to see you today! ? ? ?

## 2021-07-21 NOTE — Assessment & Plan Note (Signed)
Controlled per patient, continue bupropion XL 300 mg daily. ?

## 2021-07-22 ENCOUNTER — Ambulatory Visit (INDEPENDENT_AMBULATORY_CARE_PROVIDER_SITE_OTHER): Payer: Medicare Other | Admitting: Rheumatology

## 2021-07-22 ENCOUNTER — Encounter: Payer: Self-pay | Admitting: Rheumatology

## 2021-07-22 VITALS — BP 113/69 | HR 73 | Resp 12 | Ht 60.0 in | Wt 109.6 lb

## 2021-07-22 DIAGNOSIS — Z8041 Family history of malignant neoplasm of ovary: Secondary | ICD-10-CM | POA: Diagnosis not present

## 2021-07-22 DIAGNOSIS — F5104 Psychophysiologic insomnia: Secondary | ICD-10-CM | POA: Diagnosis not present

## 2021-07-22 DIAGNOSIS — Z8639 Personal history of other endocrine, nutritional and metabolic disease: Secondary | ICD-10-CM

## 2021-07-22 DIAGNOSIS — M797 Fibromyalgia: Secondary | ICD-10-CM | POA: Diagnosis not present

## 2021-07-22 DIAGNOSIS — M19011 Primary osteoarthritis, right shoulder: Secondary | ICD-10-CM | POA: Diagnosis not present

## 2021-07-22 DIAGNOSIS — Z8719 Personal history of other diseases of the digestive system: Secondary | ICD-10-CM

## 2021-07-22 DIAGNOSIS — Z803 Family history of malignant neoplasm of breast: Secondary | ICD-10-CM

## 2021-07-22 DIAGNOSIS — M51369 Other intervertebral disc degeneration, lumbar region without mention of lumbar back pain or lower extremity pain: Secondary | ICD-10-CM

## 2021-07-22 DIAGNOSIS — M19012 Primary osteoarthritis, left shoulder: Secondary | ICD-10-CM

## 2021-07-22 DIAGNOSIS — F419 Anxiety disorder, unspecified: Secondary | ICD-10-CM

## 2021-07-22 DIAGNOSIS — M810 Age-related osteoporosis without current pathological fracture: Secondary | ICD-10-CM | POA: Diagnosis not present

## 2021-07-22 DIAGNOSIS — F32A Depression, unspecified: Secondary | ICD-10-CM

## 2021-07-22 DIAGNOSIS — M5136 Other intervertebral disc degeneration, lumbar region: Secondary | ICD-10-CM | POA: Diagnosis not present

## 2021-07-22 DIAGNOSIS — M5030 Other cervical disc degeneration, unspecified cervical region: Secondary | ICD-10-CM

## 2021-07-22 DIAGNOSIS — M19041 Primary osteoarthritis, right hand: Secondary | ICD-10-CM | POA: Diagnosis not present

## 2021-07-22 DIAGNOSIS — M170 Bilateral primary osteoarthritis of knee: Secondary | ICD-10-CM | POA: Diagnosis not present

## 2021-07-22 DIAGNOSIS — Z800 Family history of malignant neoplasm of digestive organs: Secondary | ICD-10-CM

## 2021-07-22 DIAGNOSIS — M19042 Primary osteoarthritis, left hand: Secondary | ICD-10-CM

## 2021-08-11 ENCOUNTER — Ambulatory Visit
Admission: RE | Admit: 2021-08-11 | Discharge: 2021-08-11 | Disposition: A | Discharge status: Home / Self Care | Payer: Medicare Other | Source: Ambulatory Visit | Attending: General Surgery | Admitting: General Surgery

## 2021-08-11 ENCOUNTER — Other Ambulatory Visit (HOSPITAL_COMMUNITY): Payer: Self-pay | Admitting: Radiology

## 2021-08-11 ENCOUNTER — Other Ambulatory Visit: Payer: Self-pay | Admitting: General Surgery

## 2021-08-11 DIAGNOSIS — K632 Fistula of intestine: Secondary | ICD-10-CM | POA: Diagnosis not present

## 2021-08-11 DIAGNOSIS — K5792 Diverticulitis of intestine, part unspecified, without perforation or abscess without bleeding: Secondary | ICD-10-CM

## 2021-08-11 DIAGNOSIS — K651 Peritoneal abscess: Secondary | ICD-10-CM

## 2021-08-11 DIAGNOSIS — K529 Noninfective gastroenteritis and colitis, unspecified: Secondary | ICD-10-CM | POA: Diagnosis not present

## 2021-08-11 DIAGNOSIS — Z872 Personal history of diseases of the skin and subcutaneous tissue: Secondary | ICD-10-CM | POA: Diagnosis not present

## 2021-08-11 DIAGNOSIS — I700 Atherosclerosis of aorta: Secondary | ICD-10-CM | POA: Diagnosis not present

## 2021-08-11 DIAGNOSIS — K5732 Diverticulitis of large intestine without perforation or abscess without bleeding: Secondary | ICD-10-CM | POA: Diagnosis not present

## 2021-08-11 DIAGNOSIS — Z4682 Encounter for fitting and adjustment of non-vascular catheter: Secondary | ICD-10-CM | POA: Diagnosis not present

## 2021-08-11 HISTORY — PX: IR RADIOLOGIST EVAL & MGMT: IMG5224

## 2021-08-11 MED ORDER — IOPAMIDOL (ISOVUE-300) INJECTION 61%
100.0000 mL | Freq: Once | INTRAVENOUS | Status: AC | PRN
  Administered 2021-08-11: 100 mL via INTRAVENOUS

## 2021-08-11 NOTE — Progress Notes (Signed)
Referring Physician(s): [facility]  Chief Complaint: The patient is seen in follow up today s/p intra abdominal abscess drain along the mid sigmoid colon.   History of present illness: 71 y.o. female outpatient. female outpatient. History of osteoarthritis. MVP, HLD. Presented to the ED on 4.15.23 with abdominal pain X 1 week. Found to have acute sigmoid diverticulitis with intramural abscess along the mid sigmoid colon. IR placed a LLQ 12 Fr anterior pelvis abscess drain on 4.16.23. Cultures from the abscess grew pseudomonas aeruginosa, staph hominis and bacteroides species not fragilis. Patient was seen in the IR drain clinic on 5.5.23 where the abscess was shown to be resolved but a colonic fistula was noted to be present. The drain was changed to a gravity bag and the patient was instructed to stop flushing at that time.  She has currently completed antibiotic therapy. She is currently not flushing the drain. Output has been minimal with 25 ml's in total out over the last 2 weeks. She denies any fevers, headache, chest pain, dyspnea, cough., abdominal pain, nausea vomiting or bleeding.  patient presents for 1 month drain evaluation.   She has a follow up appointment with Dr. Donne Hazel on 6.15.23   Past Medical History:  Diagnosis Date   Achilles bursitis or tendinitis    Anxiety state, unspecified    Arthritis    Closed fracture of unspecified part of fibula    Depressive disorder, not elsewhere classified    Diverticulosis of colon (without mention of hemorrhage)    Edema    Fibromyalgia    Insomnia, unspecified    Lumbago    Mass of left ovary 06/01/2016   Meniere's disease 1986   MVP (mitral valve prolapse)    Other intra-abdominal and pelvic swelling, mass and lump 06/01/2016   Pain in joint, site unspecified    Peptic ulcer    no testing   Pure hypercholesterolemia    TSH elevation     Past Surgical History:  Procedure Laterality Date   ABDOMINAL HYSTERECTOMY   03/08/1980   fibroids; ovaries intact.   BACK SURGERY     x2 Cervical and lumbar   CYSTOSCOPY N/A 06/08/2016   Procedure: CYSTOSCOPY;  Surgeon: Will Bonnet, MD;  Location: [facility] ORS;  Service: Gynecology;  Laterality: N/A;   IR RADIOLOGIST EVAL & MGMT  07/10/2021   LAPAROSCOPIC LYSIS OF ADHESIONS  06/08/2016   Procedure: LAPAROSCOPIC LYSIS OF ADHESIONS;  Surgeon: Will Bonnet, MD;  Location: [facility] ORS;  Service: Gynecology;;   LAPAROSCOPIC SALPINGO OOPHERECTOMY Left 06/08/2016   Procedure: LAPAROSCOPIC SALPINGO OOPHORECTOMY;  Surgeon: Will Bonnet, MD;  Location: [facility] ORS;  Service: Gynecology;  Laterality: Left;   OOPHORECTOMY     left ovary   SALPINGECTOMY     left   SPINE SURGERY     lumbar spine 11/2015; cervical spine 2009. [facility].    Allergies: Demerol [meperidine], Kenalog [triamcinolone acetonide], and Adhesive [tape]  Medications: Prior to Admission medications   Medication Sig Start Date End Date Taking? Authorizing Provider  acetaminophen (TYLENOL) 325 MG tablet Take 650 mg by mouth every 6 (six) hours as needed for moderate pain or headache.    [provider]  buPROPion (WELLBUTRIN XL) 150 MG 24 hr tablet Take 2 tablets (300 mg total) by mouth daily. For depression. 03/24/21   [name], NP  busPIRone (BUSPAR) 5 MG tablet TAKE 1 TABLET (5 MG TOTAL) BY MOUTH 2 (TWO) TIMES DAILY. FOR ANXIETY 04/19/21   [name], NP  Cholecalciferol (VITAMIN D3 PO) Take 1 tablet by mouth daily. Unsure of mg    [provider]  ELDERBERRY PO Take 5 mLs by mouth daily.    [provider]  fluticasone (FLONASE) 50 MCG/ACT nasal spray Place 2 sprays into the nose daily as needed for allergies.     [provider]  Misc Natural Products (NEURIVA PO) Take 1 capsule by mouth daily.    [provider]  Multiple Vitamins-Minerals (MULTIVITAMIN WITH MINERALS) tablet Take 1 tablet by mouth daily. Centrum Silver for Women    [provider]  Omega-3 Fatty Acids (FISH OIL) 500 MG CAPS Take 500 mg by mouth daily.    [provider]  POTASSIUM PO Take 99 mg by mouth daily.    [provider]  pravastatin (PRAVACHOL) 40 MG tablet TAKE 1 TABLET BY MOUTH EVERY DAY FOR CHOLESTEROL Patient taking differently: Take 40 mg by mouth at bedtime. 05/17/21   [name], NP  vitamin B-12 (CYANOCOBALAMIN) 1000 MCG tablet Take 1,000 mcg by mouth daily.    [provider]     Family History  Problem Relation Age of Onset   Hyperlipidemia Mother    Hypertension Mother    Colon polyps Mother    Coronary artery disease Father         AMI age 53   Heart disease Father 37       AMI x 2; first AMI in 27s   Breast cancer Sister 54       with metastasis to bone; smoker   Cancer Sister 28       breast cancer   Colon cancer Maternal Uncle        dx. >50   Colon cancer Paternal Aunt        dx. 70s   Heart attack Paternal Uncle    Stroke Maternal Grandmother    Alzheimer's disease Paternal Grandmother    Heart disease Paternal Grandfather    Heart attack Paternal Grandfather    Healthy Daughter    Healthy Daughter    Breast cancer Cousin    Ovarian cancer Cousin        dx. late 70s/dx. 94s   Lung cancer Cousin        dx. 66s   Stomach cancer Other     Social History   Socioeconomic History   Marital status: Married    Spouse name: Aurie Harroun   Number of children: 2   Years of education: Not on file   Highest education level: Not on file  Occupational History   Occupation: retired    Comment: Network engineer  Tobacco Use   Smoking status: Never    Passive exposure: Never   Smokeless tobacco: Never  Vaping Use   Vaping Use: Never used  Substance and Sexual Activity   Alcohol use: Yes    Comment: 2-3 beers per week or wine   Drug use: No   Sexual activity: Yes    Birth control/protection: Surgical  Other Topics Concern   Not on file  Social History Narrative   Marital status:   Married x 14 years; second marriage;happily married,husband has PTSD.     Children: 2 daughters [name], [name]); 7 grandchildren; 2 gg      Lives: with husband, dog/beagle      Employment: retired since 2013; Network engineer      Tobacco: never      Alcohol: weekends; 1-2 glasses of wine per week or beer.  Drugs:  None      Exercise: none in 2018      Seatbelt: 100%      Guns: unloaded.     Caffeine not every day. 7 grandchildren and 2 GG. Organ donor NO.      ADLs: independent with ADLs; drives      Advanced Directives: YES; FULL CODE no prolonged measures.   Social Determinants of Health   Financial Resource Strain: Not on file  Food Insecurity: Not on file  Transportation Needs: Not on file  Physical Activity: Not on file  Stress: Not on file  Social Connections: Not on file     Vital Signs: There were no vitals taken for this visit.  Physical Exam Vitals and nursing note reviewed.  Constitutional:      Appearance: She is well-developed.  HENT:     Head: Normocephalic and atraumatic.  Eyes:     Conjunctiva/sclera: Conjunctivae normal.  Pulmonary:     Effort: Pulmonary effort is normal.  Abdominal:     Comments: < 10 ml's of tan serosangious fluid noted to be in the gravity bag.   Musculoskeletal:        General: Normal range of motion.     Cervical back: Normal range of motion.  Skin:    General: Skin is warm.  Neurological:     Mental Status: She is alert and oriented to person, place, and time.    Imaging: No results found.  Labs:  CBC: Recent Labs    06/23/21 0100 06/24/21 0210 06/25/21 0130 06/26/21 0259  WBC 4.2 2.9* 2.8* 3.4*  HGB 9.7* 10.5* 10.7* 10.6*  HCT 28.5* 31.7* 31.9* 32.3*  PLT 286 287 335 382    COAGS: Recent Labs    06/20/21 1417  INR 1.3*    BMP: Recent Labs    06/23/21 0100 06/24/21 0210 06/25/21 0130 06/26/21 0259  NA 139 141 140 135  K 3.9 3.2* 2.9* 4.1  CL 106 107 106 106  CO2 '23 26 27 24  '$ GLUCOSE 79 103* 94  114*  BUN 8 <5* <5* <5*  CALCIUM 7.9* 8.0* 8.4* 8.4*  CREATININE 0.55 0.47 0.48 0.52  GFRNONAA >60 >60 >60 >60    LIVER FUNCTION TESTS: Recent Labs    03/24/21 1159 06/20/21 1417 06/21/21 0500  BILITOT 0.4 0.8 0.8  AST 17 15 12*  ALT '15 11 9  '$ ALKPHOS 46 50 43  PROT 7.4 7.3 6.0*  ALBUMIN 4.5 3.3* 2.6*    Assessment:  71 y.o. female outpatient. History of osteoarthritis. MVP, HLD. Presented to the ED on 4.15.23 with abdominal pain X 1 week. Found to have acute sigmoid diverticulitis with intramural abscess along the mid sigmoid colon. IR placed a LLQ 12 Fr anterior pelvis abscess drain on 4.16.23. Cultures from the abscess grew pseudomonas aeruginosa, staph hominis and bacteroides species not fragilis. Patient was seen in the IR drain clinic on 5.5.23 where the abscess was shown to be resolved but a colonic fistula was noted to be present. The drain was changed to a gravity bag and the patient was instructed to stop flushing at that time.  She has currently completed antibiotic therapy. She is currently not flushing the drain. Output has been minimal with 25 ml's in total out over the last 2 weeks. She denies any fevers, headache, chest pain, dyspnea, cough., abdominal pain, nausea vomiting or bleeding.  patient presents for 1 month drain evaluation.   She has a follow up appointment with Dr. Donne Hazel  on 6.15.23  CT Abdomen pelvis done today reveals resolved abscess. Follow-up drain injection today reveals improving but  persistent fistula.  Images were reviewed by Dr. Vernard Gambles. The drain was placed back to a  gravity bag.  Drain/gravity bag care discussed with patient and spouse.  Patient was instructed to continue maintaining output records, and dressing changes as needed. She was reminded to continue NOT flushing the drain.  Follow up to be scheduled for drain injection in 1 month's time. Request Patient call IR schedulers to cancel her appointment should she have surgery prior to  scheduled follow up with IR. Patient verbalized understanding and is in agreement with the plan of care.   Signed: Jacqualine Mau, NP 08/11/2021, 12:56 PM   Please refer to Dr. Vernard Gambles attestation of this note for management and plan.

## 2021-08-17 ENCOUNTER — Ambulatory Visit: Admit: 2021-08-17 | Payer: Medicare Other | Admitting: Ophthalmology

## 2021-08-17 SURGERY — PHACOEMULSIFICATION, CATARACT, WITH IOL INSERTION
Anesthesia: Topical | Laterality: Right

## 2021-08-31 ENCOUNTER — Ambulatory Visit: Admit: 2021-08-31 | Payer: Medicare Other | Admitting: Ophthalmology

## 2021-08-31 SURGERY — PHACOEMULSIFICATION, CATARACT, WITH IOL INSERTION
Anesthesia: Topical | Laterality: Left

## 2021-09-01 DIAGNOSIS — K5720 Diverticulitis of large intestine with perforation and abscess without bleeding: Secondary | ICD-10-CM | POA: Diagnosis not present

## 2021-09-04 DIAGNOSIS — K651 Peritoneal abscess: Secondary | ICD-10-CM

## 2021-09-04 NOTE — Addendum Note (Signed)
Addended by: [name] on: 09/04/2021 01:57 PM   Modules accepted: Orders

## 2021-09-04 NOTE — Telephone Encounter (Signed)
You need to see her to do referral? She is not due for screening

## 2021-09-09 ENCOUNTER — Other Ambulatory Visit: Payer: Self-pay | Admitting: General Surgery

## 2021-09-09 ENCOUNTER — Other Ambulatory Visit: Payer: Self-pay | Admitting: Interventional Radiology

## 2021-09-09 ENCOUNTER — Other Ambulatory Visit: Payer: Self-pay | Admitting: Anesthesiology

## 2021-09-09 ENCOUNTER — Ambulatory Visit
Admission: RE | Admit: 2021-09-09 | Discharge: 2021-09-09 | Disposition: A | Discharge status: Home / Self Care | Payer: Medicare Other | Source: Ambulatory Visit | Attending: General Surgery | Admitting: General Surgery

## 2021-09-09 ENCOUNTER — Ambulatory Visit
Admission: RE | Admit: 2021-09-09 | Discharge: 2021-09-09 | Disposition: A | Discharge status: Home / Self Care | Payer: Medicare Other | Source: Ambulatory Visit | Attending: Radiology | Admitting: Radiology

## 2021-09-09 DIAGNOSIS — K5792 Diverticulitis of intestine, part unspecified, without perforation or abscess without bleeding: Secondary | ICD-10-CM

## 2021-09-09 DIAGNOSIS — K632 Fistula of intestine: Secondary | ICD-10-CM | POA: Diagnosis not present

## 2021-09-09 DIAGNOSIS — Z4682 Encounter for fitting and adjustment of non-vascular catheter: Secondary | ICD-10-CM | POA: Diagnosis not present

## 2021-09-09 DIAGNOSIS — L0291 Cutaneous abscess, unspecified: Secondary | ICD-10-CM

## 2021-09-09 DIAGNOSIS — K651 Peritoneal abscess: Secondary | ICD-10-CM

## 2021-09-09 HISTORY — PX: IR RADIOLOGIST EVAL & MGMT: IMG5224

## 2021-09-09 NOTE — Progress Notes (Signed)
Referring Physician(s): Dr. Rolm Bookbinder  Chief Complaint: The patient is seen in follow up today s/p CT guided abscess drain placement on 06/21/21 for treatment of diverticular abscess.  History of present illness:  71 y.o. female outpatient. female outpatient. History of osteoarthritis. MVP, HLD. Presented to the ED on 4.15.23 with abdominal pain X 1 week. Found to have acute sigmoid diverticulitis with intramural abscess along the mid sigmoid colon. IR placed a LLQ 12 Fr anterior pelvis abscess drain on 4.16.23.   Patient was seen in the IR drain clinic on 5.5.23 and 6.6.23. Both times, there was no residual abscess but a colonic fistula was present. The drain was changed to a gravity bag and the patient was instructed to stop flushing at that time.   She returns to IR clinic today for drain follow up. She is currently not flushing the drain. Output has been minimal with 25 ml's in total out over the last 5 days. She denies any fevers, headache, chest pain, dyspnea, cough, abdominal pain, nausea vomiting or bleeding.     Past Medical History:  Diagnosis Date   Achilles bursitis or tendinitis    Anxiety state, unspecified    Arthritis    Closed fracture of unspecified part of fibula    Depressive disorder, not elsewhere classified    Diverticulosis of colon (without mention of hemorrhage)    Edema    Fibromyalgia    Insomnia, unspecified    Lumbago    Mass of left ovary 06/01/2016   Meniere's disease 1986   MVP (mitral valve prolapse)    Other intra-abdominal and pelvic swelling, mass and lump 06/01/2016   Pain in joint, site unspecified    Peptic ulcer    no testing   Pure hypercholesterolemia    TSH elevation     Past Surgical History:  Procedure Laterality Date   ABDOMINAL HYSTERECTOMY  03/08/1980   fibroids; ovaries intact.   BACK SURGERY     x2 Cervical and lumbar   CYSTOSCOPY N/A 06/08/2016   Procedure: CYSTOSCOPY;  Surgeon: Will Bonnet, MD;  Location:  [facility];  Service: Gynecology;  Laterality: N/A;   IR RADIOLOGIST EVAL & MGMT  07/10/2021   IR RADIOLOGIST EVAL & MGMT  08/11/2021   IR RADIOLOGIST EVAL & MGMT  09/09/2021   LAPAROSCOPIC LYSIS OF ADHESIONS  06/08/2016   Procedure: LAPAROSCOPIC LYSIS OF ADHESIONS;  Surgeon: Will Bonnet, MD;  Location: [facility];  Service: Gynecology;;   LAPAROSCOPIC SALPINGO OOPHERECTOMY Left 06/08/2016   Procedure: LAPAROSCOPIC SALPINGO OOPHORECTOMY;  Surgeon: Will Bonnet, MD;  Location: [facility];  Service: Gynecology;  Laterality: Left;   OOPHORECTOMY     left ovary   SALPINGECTOMY     left   SPINE SURGERY     lumbar spine 11/2015; cervical spine 2009. [place].    Allergies: Demerol [meperidine], Kenalog [triamcinolone acetonide], and Adhesive [tape]  Medications: Prior to Admission medications   Medication Sig Start Date End Date Taking? Authorizing Provider  acetaminophen (TYLENOL) 325 MG tablet Take 650 mg by mouth every 6 (six) hours as needed for moderate pain or headache.    [provider]  buPROPion (WELLBUTRIN XL) 150 MG 24 hr tablet Take 2 tablets (300 mg total) by mouth daily. For depression. 03/24/21   [name], NP  busPIRone (BUSPAR) 5 MG tablet TAKE 1 TABLET (5 MG TOTAL) BY MOUTH 2 (TWO) TIMES DAILY. FOR ANXIETY 04/19/21   [name], NP  Cholecalciferol (VITAMIN D3 PO)  Take 1 tablet by mouth daily. Unsure of mg    [provider]  ELDERBERRY PO Take 5 mLs by mouth daily.    [provider]  fluticasone (FLONASE) 50 MCG/ACT nasal spray Place 2 sprays into the nose daily as needed for allergies.     [provider]  Misc Natural Products (NEURIVA PO) Take 1 capsule by mouth daily.    [provider]  Multiple Vitamins-Minerals (MULTIVITAMIN WITH MINERALS) tablet Take 1 tablet by mouth daily. Centrum Silver for Women    [provider]  Omega-3 Fatty Acids (FISH OIL) 500 MG CAPS Take 500 mg by mouth daily.    [provider]  POTASSIUM PO Take 99 mg by mouth daily.    [provider]  pravastatin (PRAVACHOL) 40 MG tablet TAKE 1 TABLET BY MOUTH EVERY DAY FOR CHOLESTEROL Patient taking differently: Take 40 mg by mouth at bedtime. 05/17/21   [name], NP  vitamin B-12 (CYANOCOBALAMIN) 1000 MCG tablet Take 1,000 mcg by mouth daily.    [provider]     Family History  Problem Relation Age of Onset   Hyperlipidemia Mother    Hypertension Mother    Colon polyps Mother    Coronary artery disease Father         AMI age 89   Heart disease Father 34       AMI x 2; first AMI in 90s   Breast cancer Sister 30       with metastasis to bone; smoker   Cancer Sister 35       breast cancer   Colon cancer Maternal Uncle        dx. >50   Colon cancer Paternal Aunt        dx. 53s   Heart attack Paternal Uncle    Stroke Maternal Grandmother    Alzheimer's disease Paternal Grandmother    Heart disease Paternal Grandfather    Heart attack Paternal Grandfather    Healthy Daughter    Healthy Daughter    Breast cancer Cousin    Ovarian cancer Cousin        dx. late 70s/dx. 41s   Lung cancer Cousin        dx. 26s   Stomach cancer Other     Social History   Socioeconomic History   Marital status: Married    Spouse name: Tallulah Hosman   Number of children: 2   Years of education: Not on file   Highest education level: Not on file  Occupational History   Occupation: retired    Comment: Network engineer  Tobacco Use   Smoking status: Never    Passive exposure: Never   Smokeless tobacco: Never  Vaping Use   Vaping Use: Never used  Substance and Sexual Activity   Alcohol use: Yes    Comment: 2-3 beers per week or wine   Drug use: No   Sexual activity: Yes    Birth control/protection: Surgical  Other Topics Concern   Not on file  Social History Narrative   Marital status:  Married x 14 years; second marriage;happily married,husband has PTSD.     Children: 2 daughters  [name], [name]); 7 grandchildren; 2 gg      Lives: with husband, dog/beagle      Employment: retired since 2013; Network engineer      Tobacco: never      Alcohol: weekends; 1-2 glasses of wine per week or beer.  Drugs:  None      Exercise: none in 2018      Seatbelt: 100%      Guns: unloaded.     Caffeine not every day. 7 grandchildren and 2 GG. Organ donor NO.      ADLs: independent with ADLs; drives      Advanced Directives: YES; FULL CODE no prolonged measures.   Social Determinants of Health   Financial Resource Strain: Low Risk  (01/23/2020)   Overall Financial Resource Strain (CARDIA)    Difficulty of Paying Living Expenses: Not hard at all  Food Insecurity: No Food Insecurity (01/23/2020)   Hunger Vital Sign    Worried About Running Out of Food in the Last Year: Never true    Ran Out of Food in the Last Year: Never true  Transportation Needs: No Transportation Needs (01/23/2020)   PRAPARE - Hydrologist (Medical): No    Lack of Transportation (Non-Medical): No  Physical Activity: Inactive (01/23/2020)   Exercise Vital Sign    Days of Exercise per Week: 0 days    Minutes of Exercise per Session: 0 min  Stress: No Stress Concern Present (01/23/2020)   [place]    Feeling of Stress : Not at all  Social Connections: Not on file     Vital Signs: BP 118/65   Pulse 87   Temp 97.8 F (36.6 C)   Physical Exam Pulmonary:     Effort: Pulmonary effort is normal.  Abdominal:     General: Abdomen is flat.     Palpations: Abdomen is soft.  Skin:    General: Skin is warm and dry.  Neurological:     Mental Status: She is oriented to person, place, and time.  Psychiatric:        Mood and Affect: Mood normal.        Behavior: Behavior normal.     Imaging: DG Sinus/Fist Tube Chk-Non GI  Result Date: 09/09/2021 INDICATION: 71 year old woman with perforated diverticulitis requiring  percutaneous CT-guided abscess drain placed on 06/26/2021 returns to IR clinic for follow-up. EXAM: Fluoroscopic abscessogram MEDICATIONS: None ANESTHESIA/SEDATION: None COMPLICATIONS: None immediate. PROCEDURE: Informed written consent was obtained from the patient after a thorough discussion of the procedural risks, benefits and alternatives. A timeout was performed prior to the initiation of the procedure. Scout image showed the pelvic drain in unchanged position. Contrast administered through the drain showed no residual abscess cavity, however there is prompt fistulous communication with sigmoid colon. Drain flushed and reattached to bag. IMPRESSION: Pelvic abscessogram demonstrates continued fistulous communication with sigmoid colon. PLAN: 1. Patient instructed again to not flush drain. 2. Remain attached to bag. 3. Return in 4 weeks for abscess drain injection. Electronically Signed   By: Miachel Roux M.D.   On: 09/09/2021 15:01   IR Radiologist Eval & Mgmt  Result Date: 09/09/2021 Please refer to notes tab for details about interventional procedure. (Op Note)   Labs:  CBC: Recent Labs    06/23/21 0100 06/24/21 0210 06/25/21 0130 06/26/21 0259  WBC 4.2 2.9* 2.8* 3.4*  HGB 9.7* 10.5* 10.7* 10.6*  HCT 28.5* 31.7* 31.9* 32.3*  PLT 286 287 335 382    COAGS: Recent Labs    06/20/21 1417  INR 1.3*    BMP: Recent Labs    06/23/21 0100 06/24/21 0210 06/25/21 0130 06/26/21 0259  NA 139 141 140 135  K 3.9 3.2* 2.9* 4.1  CL  106 107 106 106  CO2 '23 26 27 24  '$ GLUCOSE 79 103* 94 114*  BUN 8 <5* <5* <5*  CALCIUM 7.9* 8.0* 8.4* 8.4*  CREATININE 0.55 0.47 0.48 0.52  GFRNONAA >60 >60 >60 >60    LIVER FUNCTION TESTS: Recent Labs    03/24/21 1159 06/20/21 1417 06/21/21 0500  BILITOT 0.4 0.8 0.8  AST 17 15 12*  ALT '15 11 9  '$ ALKPHOS 46 50 43  PROT 7.4 7.3 6.0*  ALBUMIN 4.5 3.3* 2.6*    Assessment and Plan:  71 year old woman with history of pelvic diverticular abscess  s/p CT guided drain placement on 4.16.23 returns to IR clinic for drain follow up.  She has had minimal drain output.  She has not been flushing the drain.  Abscessogram once more demonstrates no significant abscess cavity, but continued fistula with sigmoid colon.  She does not have any significant pain, fever or chills.  She is most concerned with the foul smell from the bag.    Given the continued fistula with sigmoid colon, the drain was left in place.  I provided her with a 3 way valve so she could flush water and soap into the bag to clean it out and limit the foul smell.  I went over the use of valve with her and husband so they understood to only use it when the bag is detached from the drain.  Plan: Return in 4 weeks for abscessogram.  Electronically Signed: Paula Libra Doneshia Hill 09/09/2021, 3:52 PM   I spent a total of 25 Minutes in face to face in clinical consultation, greater than 50% of which was counseling/coordinating care for pelvic abscess drain.

## 2021-09-10 ENCOUNTER — Other Ambulatory Visit: Payer: Self-pay | Admitting: General Surgery

## 2021-09-10 DIAGNOSIS — L0291 Cutaneous abscess, unspecified: Secondary | ICD-10-CM

## 2021-09-17 NOTE — Addendum Note (Signed)
Addended by: [name] on: 09/17/2021 05:52 PM   Modules accepted: Orders

## 2021-09-29 ENCOUNTER — Ambulatory Visit (INDEPENDENT_AMBULATORY_CARE_PROVIDER_SITE_OTHER): Payer: Medicare Other

## 2021-09-29 ENCOUNTER — Telehealth: Payer: Self-pay

## 2021-09-29 VITALS — Wt 111.0 lb

## 2021-09-29 DIAGNOSIS — Z0000 Encounter for general adult medical examination without abnormal findings: Secondary | ICD-10-CM | POA: Diagnosis not present

## 2021-09-29 DIAGNOSIS — M810 Age-related osteoporosis without current pathological fracture: Secondary | ICD-10-CM

## 2021-09-29 NOTE — Telephone Encounter (Signed)
Benefits submitted-pending. Next injection 10/07/21 or after.

## 2021-09-29 NOTE — Patient Instructions (Signed)
Tracy Estrada , Thank you for taking time to come for your Medicare Wellness Visit. I appreciate your ongoing commitment to your health goals. Please review the following plan we discussed and let me know if I can assist you in the future.   Screening recommendations/referrals: Colonoscopy: 04/07/15 Mammogram: 01/21/20 Bone Density: 03/25/21 Recommended yearly ophthalmology/optometry visit for glaucoma screening and checkup Recommended yearly dental visit for hygiene and checkup  Vaccinations: Influenza vaccine: 11/12/20 Pneumococcal vaccine: 1/31/8 Tdap vaccine: 08/10/11 Shingles vaccine: Zostavax 09/06/11 Shingrix 05/15/18, 01/22/19   Covid-19:04/19/19, 05/08/19  Advanced directives: no  Conditions/risks identified: none  Next appointment: Follow up in one year for your annual wellness visit 10/01/22 @ 10 am by phone   Preventive Care 65 Years and Older, Female Preventive care refers to lifestyle choices and visits with your health care provider that can promote health and wellness. What does preventive care include? A yearly physical exam. This is also called an annual well check. Dental exams once or twice a year. Routine eye exams. Ask your health care provider how often you should have your eyes checked. Personal lifestyle choices, including: Daily care of your teeth and gums. Regular physical activity. Eating a healthy diet. Avoiding tobacco and drug use. Limiting alcohol use. Practicing safe sex. Taking low-dose aspirin every day. Taking vitamin and mineral supplements as recommended by your health care provider. What happens during an annual well check? The services and screenings done by your health care provider during your annual well check will depend on your age, overall health, lifestyle risk factors, and family history of disease. Counseling  Your health care provider may ask you questions about your: Alcohol use. Tobacco use. Drug use. Emotional well-being. Home and  relationship well-being. Sexual activity. Eating habits. History of falls. Memory and ability to understand (cognition). Work and work Statistician. Reproductive health. Screening  You may have the following tests or measurements: Height, weight, and BMI. Blood pressure. Lipid and cholesterol levels. These may be checked every 5 years, or more frequently if you are over 58 years old. Skin check. Lung cancer screening. You may have this screening every year starting at age 32 if you have a 30-pack-year history of smoking and currently smoke or have quit within the past 15 years. Fecal occult blood test (FOBT) of the stool. You may have this test every year starting at age 73. Flexible sigmoidoscopy or colonoscopy. You may have a sigmoidoscopy every 5 years or a colonoscopy every 10 years starting at age 12. Hepatitis C blood test. Hepatitis B blood test. Sexually transmitted disease (STD) testing. Diabetes screening. This is done by checking your blood sugar (glucose) after you have not eaten for a while (fasting). You may have this done every 1-3 years. Bone density scan. This is done to screen for osteoporosis. You may have this done starting at age 22. Mammogram. This may be done every 1-2 years. Talk to your health care provider about how often you should have regular mammograms. Talk with your health care provider about your test results, treatment options, and if necessary, the need for more tests. Vaccines  Your health care provider may recommend certain vaccines, such as: Influenza vaccine. This is recommended every year. Tetanus, diphtheria, and acellular pertussis (Tdap, Td) vaccine. You may need a Td booster every 10 years. Zoster vaccine. You may need this after age 7. Pneumococcal 13-valent conjugate (PCV13) vaccine. One dose is recommended after age 64. Pneumococcal polysaccharide (PPSV23) vaccine. One dose is recommended after age 38. Talk to  your health care provider  about which screenings and vaccines you need and how often you need them. This information is not intended to replace advice given to you by your health care provider. Make sure you discuss any questions you have with your health care provider. Document Released: 03/21/2015 Document Revised: 11/12/2015 Document Reviewed: 12/24/2014 Elsevier Interactive Patient Education  2017 [place] Prevention in the Home Falls can cause injuries. They can happen to people of all ages. There are many things you can do to make your home safe and to help prevent falls. What can I do on the outside of my home? Regularly fix the edges of walkways and driveways and fix any cracks. Remove anything that might make you trip as you walk through a door, such as a raised step or threshold. Trim any bushes or trees on the path to your home. Use bright outdoor lighting. Clear any walking paths of anything that might make someone trip, such as rocks or tools. Regularly check to see if handrails are loose or broken. Make sure that both sides of any steps have handrails. Any raised decks and porches should have guardrails on the edges. Have any leaves, snow, or ice cleared regularly. Use sand or salt on walking paths during winter. Clean up any spills in your garage right away. This includes oil or grease spills. What can I do in the bathroom? Use night lights. Install grab bars by the toilet and in the tub and shower. Do not use towel bars as grab bars. Use non-skid mats or decals in the tub or shower. If you need to sit down in the shower, use a plastic, non-slip stool. Keep the floor dry. Clean up any water that spills on the floor as soon as it happens. Remove soap buildup in the tub or shower regularly. Attach bath mats securely with double-sided non-slip rug tape. Do not have throw rugs and other things on the floor that can make you trip. What can I do in the bedroom? Use night lights. Make sure  that you have a light by your bed that is easy to reach. Do not use any sheets or blankets that are too big for your bed. They should not hang down onto the floor. Have a firm chair that has side arms. You can use this for support while you get dressed. Do not have throw rugs and other things on the floor that can make you trip. What can I do in the kitchen? Clean up any spills right away. Avoid walking on wet floors. Keep items that you use a lot in easy-to-reach places. If you need to reach something above you, use a strong step stool that has a grab bar. Keep electrical cords out of the way. Do not use floor polish or wax that makes floors slippery. If you must use wax, use non-skid floor wax. Do not have throw rugs and other things on the floor that can make you trip. What can I do with my stairs? Do not leave any items on the stairs. Make sure that there are handrails on both sides of the stairs and use them. Fix handrails that are broken or loose. Make sure that handrails are as long as the stairways. Check any carpeting to make sure that it is firmly attached to the stairs. Fix any carpet that is loose or worn. Avoid having throw rugs at the top or bottom of the stairs. If you do have throw rugs, attach  them to the floor with carpet tape. Make sure that you have a light switch at the top of the stairs and the bottom of the stairs. If you do not have them, ask someone to add them for you. What else can I do to help prevent falls? Wear shoes that: Do not have high heels. Have rubber bottoms. Are comfortable and fit you well. Are closed at the toe. Do not wear sandals. If you use a stepladder: Make sure that it is fully opened. Do not climb a closed stepladder. Make sure that both sides of the stepladder are locked into place. Ask someone to hold it for you, if possible. Clearly mark and make sure that you can see: Any grab bars or handrails. First and last steps. Where the edge of  each step is. Use tools that help you move around (mobility aids) if they are needed. These include: Canes. Walkers. Scooters. Crutches. Turn on the lights when you go into a dark area. Replace any light bulbs as soon as they burn out. Set up your furniture so you have a clear path. Avoid moving your furniture around. If any of your floors are uneven, fix them. If there are any pets around you, be aware of where they are. Review your medicines with your doctor. Some medicines can make you feel dizzy. This can increase your chance of falling. Ask your doctor what other things that you can do to help prevent falls. This information is not intended to replace advice given to you by your health care provider. Make sure you discuss any questions you have with your health care provider. Document Released: 12/19/2008 Document Revised: 07/31/2015 Document Reviewed: 03/29/2014 Elsevier Interactive Patient Education  2017 [employer].

## 2021-09-29 NOTE — Progress Notes (Signed)
Virtual Visit via Telephone Note  I connected with  Tracy Estrada on 09/29/21 at 10:30 AM EDT by telephone and verified that I am speaking with the correct person using two identifiers.  Location: Patient: home Provider: [place] Persons participating in the virtual visit: [place]   I discussed the limitations, risks, security and privacy concerns of performing an evaluation and management service by telephone and the availability of in person appointments. The patient expressed understanding and agreed to proceed.  Interactive audio and video telecommunications were attempted between this nurse and patient, however failed, due to patient having technical difficulties OR patient did not have access to video capability.  We continued and completed visit with audio only.  Some vital signs may be absent or patient reported.   Dionisio David, LPN  Subjective:   Tracy Estrada is a 71 y.o. female who presents for Medicare Annual (Subsequent) preventive examination.  Review of Systems     Cardiac Risk Factors include: advanced age (>93mn, >>68women);dyslipidemia     Objective:    There were no vitals filed for this visit. There is no height or weight on file to calculate BMI.     09/29/2021   10:37 AM 06/21/2021    8:30 PM 05/01/2021    1:51 PM 04/03/2020    1:38 PM 03/17/2020    1:30 PM 01/23/2020    2:02 PM 06/03/2016   10:51 AM  Advanced Directives  Does Patient Have a Medical Advance Directive? No Yes Yes Yes No Yes Yes  Type of AArmed forces technical officerof APlainsLiving will Living will;Healthcare Power of AStartupLiving will HMedoraLiving will  Does patient want to make changes to medical advance directive?  No - Patient declined Yes (ED - Information included in AVS)      Copy of HV[place]in Chart?  No - copy requested    No - copy  requested   Would patient like information on creating a medical advance directive? No - Patient declined No - Patient declined Yes (ED - Information included in AVS)        Current Medications (verified) Outpatient Encounter Medications as of 09/29/2021  Medication Sig   acetaminophen (TYLENOL) 325 MG tablet Take 650 mg by mouth every 6 (six) hours as needed for moderate pain or headache.   buPROPion (WELLBUTRIN XL) 150 MG 24 hr tablet Take 2 tablets (300 mg total) by mouth daily. For depression.   busPIRone (BUSPAR) 5 MG tablet TAKE 1 TABLET (5 MG TOTAL) BY MOUTH 2 (TWO) TIMES DAILY. FOR ANXIETY   Cholecalciferol (VITAMIN D3 PO) Take 1 tablet by mouth daily. Unsure of mg   ELDERBERRY PO Take 5 mLs by mouth daily.   fluticasone (FLONASE) 50 MCG/ACT nasal spray Place 2 sprays into the nose daily as needed for allergies.    Misc Natural Products (NEURIVA PO) Take 1 capsule by mouth daily.   Multiple Vitamins-Minerals (MULTIVITAMIN WITH MINERALS) tablet Take 1 tablet by mouth daily. Centrum Silver for Women   Omega-3 Fatty Acids (FISH OIL) 500 MG CAPS Take 500 mg by mouth daily.   POTASSIUM PO Take 99 mg by mouth daily.   pravastatin (PRAVACHOL) 40 MG tablet TAKE 1 TABLET BY MOUTH EVERY DAY FOR CHOLESTEROL (Patient taking differently: Take 40 mg by mouth at bedtime.)   vitamin B-12 (CYANOCOBALAMIN) 1000 MCG tablet Take 1,000 mcg by mouth daily.  No facility-administered encounter medications on file as of 09/29/2021.    Allergies (verified) Demerol [meperidine], Kenalog [triamcinolone acetonide], and Adhesive [tape]   History: Past Medical History:  Diagnosis Date   Achilles bursitis or tendinitis    Anxiety state, unspecified    Arthritis    Closed fracture of unspecified part of fibula    Depressive disorder, not elsewhere classified    Diverticulosis of colon (without mention of hemorrhage)    Edema    Fibromyalgia    Insomnia, unspecified    Lumbago    Mass of left ovary  06/01/2016   Meniere's disease 1986   MVP (mitral valve prolapse)    Other intra-abdominal and pelvic swelling, mass and lump 06/01/2016   Pain in joint, site unspecified    Peptic ulcer    no testing   Pure hypercholesterolemia    TSH elevation    Past Surgical History:  Procedure Laterality Date   ABDOMINAL HYSTERECTOMY  03/08/1980   fibroids; ovaries intact.   BACK SURGERY     x2 Cervical and lumbar   CYSTOSCOPY N/A 06/08/2016   Procedure: CYSTOSCOPY;  Surgeon: Will Bonnet, MD;  Location: [facility];  Service: Gynecology;  Laterality: N/A;   IR RADIOLOGIST EVAL & MGMT  07/10/2021   IR RADIOLOGIST EVAL & MGMT  08/11/2021   IR RADIOLOGIST EVAL & MGMT  09/09/2021   LAPAROSCOPIC LYSIS OF ADHESIONS  06/08/2016   Procedure: LAPAROSCOPIC LYSIS OF ADHESIONS;  Surgeon: Will Bonnet, MD;  Location: [facility];  Service: Gynecology;;   LAPAROSCOPIC SALPINGO OOPHERECTOMY Left 06/08/2016   Procedure: LAPAROSCOPIC SALPINGO OOPHORECTOMY;  Surgeon: Will Bonnet, MD;  Location: [facility];  Service: Gynecology;  Laterality: Left;   OOPHORECTOMY     left ovary   SALPINGECTOMY     left   SPINE SURGERY     lumbar spine 11/2015; cervical spine 2009. [facility].   Family History  Problem Relation Age of Onset   Hyperlipidemia Mother    Hypertension Mother    Colon polyps Mother    Coronary artery disease Father         AMI age 81   Heart disease Father 54       AMI x 2; first AMI in 56s   Breast cancer Sister 9       with metastasis to bone; smoker   Cancer Sister 1       breast cancer   Colon cancer Maternal Uncle        dx. >50   Colon cancer Paternal Aunt        dx. 57s   Heart attack Paternal Uncle    Stroke Maternal Grandmother    Alzheimer's disease Paternal Grandmother    Heart disease Paternal Grandfather    Heart attack Paternal Grandfather    Healthy Daughter    Healthy Daughter    Breast cancer Cousin    Ovarian cancer Cousin        dx. late 70s/dx. 37s   Lung cancer Cousin         dx. 76s   Stomach cancer Other    Social History   Socioeconomic History   Marital status: Married    Spouse name: Caliya Narine   Number of children: 2   Years of education: Not on file   Highest education level: Not on file  Occupational History   Occupation: retired    Comment: Network engineer  Tobacco Use   Smoking status: Never    Passive exposure:  Never   Smokeless tobacco: Never  Vaping Use   Vaping Use: Never used  Substance and Sexual Activity   Alcohol use: Yes    Comment: 2-3 beers per week or wine   Drug use: No   Sexual activity: Yes    Birth control/protection: Surgical  Other Topics Concern   Not on file  Social History Narrative   Marital status:  Married x 14 years; second marriage;happily married,husband has PTSD.     Children: 2 daughters [name], [name]); 7 grandchildren; 2 gg      Lives: with husband, dog/beagle      Employment: retired since 2013; Network engineer      Tobacco: never      Alcohol: weekends; 1-2 glasses of wine per week or beer.      Drugs:  None      Exercise: none in 2018      Seatbelt: 100%      Guns: unloaded.     Caffeine not every day. 7 grandchildren and 2 GG. Organ donor NO.      ADLs: independent with ADLs; drives      Advanced Directives: YES; FULL CODE no prolonged measures.   Social Determinants of Health   Financial Resource Strain: Low Risk  (09/29/2021)   Overall Financial Resource Strain (CARDIA)    Difficulty of Paying Living Expenses: Not hard at all  Food Insecurity: No Food Insecurity (09/29/2021)   Hunger Vital Sign    Worried About Running Out of Food in the Last Year: Never true    Ran Out of Food in the Last Year: Never true  Transportation Needs: No Transportation Needs (09/29/2021)   PRAPARE - Hydrologist (Medical): No    Lack of Transportation (Non-Medical): No  Physical Activity: Insufficiently Active (09/29/2021)   Exercise Vital Sign    Days of Exercise per Week: 2 days     Minutes of Exercise per Session: 20 min  Stress: No Stress Concern Present (09/29/2021)   [place]    Feeling of Stress : Only a little  Social Connections: Moderately Integrated (09/29/2021)   Social Connection and Isolation Panel [NHANES]    Frequency of Communication with Friends and Family: More than three times a week    Frequency of Social Gatherings with Friends and Family: Once a week    Attends Religious Services: More than 4 times per year    Active Member of [employer] or Organizations: No    Attends Music therapist: Never    Marital Status: Married    Tobacco Counseling Counseling given: Not Answered   Clinical Intake:  Pre-visit preparation completed: Yes  Pain : No/denies pain     Nutritional Risks: None Diabetes: No  How often do you need to have someone help you when you read instructions, pamphlets, or other written materials from your doctor or pharmacy?: 1 - Never  Diabetic?no  Interpreter Needed?: No  Information entered by :: Kirke Shaggy, LPN   Activities of Daily Living    09/29/2021   10:38 AM 06/21/2021    8:30 PM  In your present state of health, do you have any difficulty performing the following activities:  Hearing? 1 0  Vision? 0 0  Difficulty concentrating or making decisions? 0 0  Walking or climbing stairs? 0 0  Dressing or bathing? 0 0  Doing errands, shopping? 0 0  Preparing Food and eating ? N   Using  the Toilet? N   In the past six months, have you accidently leaked urine? N   Do you have problems with loss of bowel control? N   Managing your Medications? N   Managing your Finances? N   Housekeeping or managing your Housekeeping? N     Patient Care Team: [name], NP as PCP - General (Internal Medicine) Cletis Athens, MD (Cardiology) Kristeen Miss, MD (Neurosurgery)  Indicate any recent Medical Services you may have received from  other than [facility] providers in the past year (date may be approximate).     Assessment:   This is a routine wellness examination for Hayla.  Hearing/Vision screen Hearing Screening - Comments:: Wears aids Vision Screening - Comments:: Wears glasses- [facility]  Dietary issues and exercise activities discussed: Current Exercise Habits: Home exercise routine, Type of exercise: walking, Time (Minutes): 20, Frequency (Times/Week): 2, Weekly Exercise (Minutes/Week): 40, Intensity: Mild   Goals Addressed             This Visit's Progress    DIET - EAT MORE FRUITS AND VEGETABLES         Depression Screen    09/29/2021   10:34 AM 03/24/2021   11:43 AM 01/23/2020    2:04 PM 01/25/2019   11:16 AM 12/13/2017    2:25 PM 04/07/2016   12:13 PM 10/15/2015    2:50 PM  PHQ 2/9 Scores  PHQ - 2 Score 0 0 0 0 0 0 0  PHQ- 9 Score 0 1 0        Fall Risk    09/29/2021   10:38 AM 03/24/2021   11:43 AM 01/28/2020   11:16 AM 01/23/2020    2:03 PM 01/25/2019   11:16 AM  Fall Risk   Falls in the past year? 0 0 1 1   Number falls in past yr: 0 0 0 0 0  Injury with Fall? 0 0 1 1 0  Comment    right shoulder hurting   Risk for fall due to : No Fall Risks  Impaired balance/gait Medication side effect   Follow up Falls evaluation completed   Falls evaluation completed;Falls prevention discussed     FALL RISK PREVENTION PERTAINING TO THE HOME:  Any stairs in or around the home? No  If so, are there any without handrails? No  Home free of loose throw rugs in walkways, pet beds, electrical cords, etc? Yes  Adequate lighting in your home to reduce risk of falls? Yes   ASSISTIVE DEVICES UTILIZED TO PREVENT FALLS:  Life alert? No  Use of a cane, walker or w/c? Yes  Grab bars in the bathroom? Yes  Shower chair or bench in shower? Yes  Elevated toilet seat or a handicapped toilet? Yes    Cognitive Function:    01/23/2020    2:05 PM  MMSE - Mini Mental State Exam  Orientation to time 5   Orientation to Place 5  Registration 3  Attention/ Calculation 5  Recall 3  Language- repeat 1        09/29/2021   10:39 AM  6CIT Screen  What Year? 0 points  What month? 0 points  What time? 0 points  Count back from 20 0 points  Months in reverse 0 points  Repeat phrase 0 points  Total Score 0 points    Immunizations Immunization History  Administered Date(s) Administered   Fluad Quad(high Dose 65+) 01/22/2019, 01/09/2020   Influenza,inj,Quad PF,6+ Mos 11/06/2014  Influenza-Unspecified 01/07/2016, 12/13/2016, 11/12/2020   PFIZER(Purple Top)SARS-COV-2 Vaccination 04/19/2019, 05/08/2019   Pneumococcal Conjugate-13 11/06/2014   Pneumococcal Polysaccharide-23 04/07/2016   Tdap 08/10/2011   Zoster Recombinat (Shingrix) 05/15/2018, 09/16/2018, 01/22/2019   Zoster, Live 09/06/2011    TDAP status: Due, Education has been provided regarding the importance of this vaccine. Advised may receive this vaccine at local pharmacy or Health Dept. Aware to provide a copy of the vaccination record if obtained from local pharmacy or Health Dept. Verbalized acceptance and understanding.  Flu Vaccine status: Up to date  Pneumococcal vaccine status: Up to date  Covid-19 vaccine status: Completed vaccines  Qualifies for Shingles Vaccine? Yes   Zostavax completed Yes   Shingrix Completed?: No.    Education has been provided regarding the importance of this vaccine. Patient has been advised to call insurance company to determine out of pocket expense if they have not yet received this vaccine. Advised may also receive vaccine at local pharmacy or Health Dept. Verbalized acceptance and understanding.  Screening Tests Health Maintenance  Topic Date Due   COVID-19 Vaccine (3 - Pfizer series) 07/03/2019   TETANUS/TDAP  08/09/2021   INFLUENZA VACCINE  10/06/2021   MAMMOGRAM  01/20/2022   COLONOSCOPY (Pts 45-32yr Insurance coverage will need to be confirmed)  04/06/2025   Pneumonia Vaccine  71 Years old  Completed   DEXA SCAN  Completed   Hepatitis C Screening  Completed   Zoster Vaccines- Shingrix  Completed   HPV VACCINES  Aged Out    Health Maintenance  Health Maintenance Due  Topic Date Due   COVID-19 Vaccine (3 - Pfizer series) 07/03/2019   TETANUS/TDAP  08/09/2021    Colorectal cancer screening: Type of screening: Colonoscopy. Completed 04/07/15. Repeat every 10 years- getting ready to have another due to colonic abscess  Mammogram status: Completed 01/21/20. Repeat every year- referral sent  Bone Density status: Completed 03/25/21. Results reflect: Bone density results: OSTEOPENIA. Repeat every 5 years.  Lung Cancer Screening: (Low Dose CT Chest recommended if Age 73-80years, 30 pack-year currently smoking OR have quit w/in 15years.) does not qualify.   Additional Screening:  Hepatitis C Screening: does qualify; Completed 03/17/20  Vision Screening: Recommended annual ophthalmology exams for early detection of glaucoma and other disorders of the eye. Is the patient up to date with their annual eye exam?  Yes  Who is the provider or what is the name of the office in which the patient attends annual eye exams? Dr.King If pt is not established with a provider, would they like to be referred to a provider to establish care? No .   Dental Screening: Recommended annual dental exams for proper oral hygiene  Community Resource Referral / Chronic Care Management: CRR required this visit?  No   CCM required this visit?  No      Plan:     I have personally reviewed and noted the following in the patient's chart:   Medical and social history Use of alcohol, tobacco or illicit drugs  Current medications and supplements including opioid prescriptions.  Functional ability and status Nutritional status Physical activity Advanced directives List of other physicians Hospitalizations, surgeries, and ER visits in previous 12 months Vitals Screenings to include  cognitive, depression, and falls Referrals and appointments  In addition, I have reviewed and discussed with patient certain preventive protocols, quality metrics, and best practice recommendations. A written personalized care plan for preventive services as well as general preventive health recommendations were provided to patient.  Dionisio David, LPN   [id number]   Nurse Notes: none

## 2021-10-07 ENCOUNTER — Other Ambulatory Visit: Payer: Medicare Other

## 2021-10-09 ENCOUNTER — Telehealth: Payer: Self-pay | Admitting: Gastroenterology

## 2021-10-09 ENCOUNTER — Ambulatory Visit
Admission: RE | Admit: 2021-10-09 | Discharge: 2021-10-09 | Disposition: A | Discharge status: Home / Self Care | Payer: Medicare Other | Source: Ambulatory Visit | Attending: General Surgery | Admitting: General Surgery

## 2021-10-09 DIAGNOSIS — Z4682 Encounter for fitting and adjustment of non-vascular catheter: Secondary | ICD-10-CM | POA: Diagnosis not present

## 2021-10-09 DIAGNOSIS — K632 Fistula of intestine: Secondary | ICD-10-CM | POA: Diagnosis not present

## 2021-10-09 DIAGNOSIS — L0291 Cutaneous abscess, unspecified: Secondary | ICD-10-CM

## 2021-10-09 HISTORY — PX: IR RADIOLOGIST EVAL & MGMT: IMG5224

## 2021-10-09 NOTE — Telephone Encounter (Signed)
Hi Dr. Tarri Glenn,  This patient brought her prior colonoscopy report from Dr. Liliane Channel office as he is retiring and she wants to establish care with you.  Per patient's statement, she also has a colostomy bag and they are wanting to do surgery to move it.  She has a hole in her colon.  Her doctors told her she needed to have a colonoscopy prior to doing the surgery in order to make sure she doesn't have any other issues.  We do not have those records, but she says it was done at [facility].  I am sending you the records we do have for your review.  Please let me know how you would like to proceed.  Thank you.

## 2021-10-09 NOTE — Telephone Encounter (Signed)
I will look for those records for review. Thanks.

## 2021-10-09 NOTE — Progress Notes (Signed)
Referring Physician(s): Dr. Rolm Bookbinder   Chief Complaint: The patient is seen in follow up today s/p CT guided abscess drain placement on 06/21/21 for treatment of diverticular abscess.   History of present illness: [name] is 71 yo female presenting as a follow up to [facility], with LLQ drain in place.   History:  Presented to the ED on 4.15.23 with abdominal pain X 1 week. Found to have acute sigmoid diverticulitis with intramural abscess along the mid sigmoid colon. IR placed a LLQ 12 Fr anterior pelvis abscess drain on 4.16.23.    Patient was seen in the IR drain clinic on 5.5.23, 6.6.23, 09/09/21. Evaluation on each visit showed no residual abscess but a colonic fistula persists with injection on fluoro.    Changes include: Drain was changed to a gravity bag, and the patient was instructed to stop flushing.   Interval hx: She is currently not flushing the drain. She flushed the external drain to clear the tube of material.  This has resulted in improved small from the bag.   Output persists at about 25 ml's over several days. She denies any fevers, headache, chest pain, dyspnea, cough, abdominal pain, nausea vomiting or bleeding.   I see that the last surgical note was late June with Dr. Dema Severin.  The plan tentatively was to improve her nutritional status, update colonoscopy, and then discuss surgical options.   She tells me she has been doing better from nutritional and has gained ~10lbs.  She says "I like those Boost".  She is dropping her records at [facility] today.    Drain injection today shows persisting fistula to the bowel.   Past Medical History:  Diagnosis Date   Achilles bursitis or tendinitis    Anxiety state, unspecified    Arthritis    Closed fracture of unspecified part of fibula    Depressive disorder, not elsewhere classified    Diverticulosis of colon (without mention of hemorrhage)    Edema    Fibromyalgia    Insomnia, unspecified     Lumbago    Mass of left ovary 06/01/2016   Meniere's disease 1986   MVP (mitral valve prolapse)    Other intra-abdominal and pelvic swelling, mass and lump 06/01/2016   Pain in joint, site unspecified    Peptic ulcer    no testing   Pure hypercholesterolemia    TSH elevation     Past Surgical History:  Procedure Laterality Date   ABDOMINAL HYSTERECTOMY  03/08/1980   fibroids; ovaries intact.   BACK SURGERY     x2 Cervical and lumbar   CYSTOSCOPY N/A 06/08/2016   Procedure: CYSTOSCOPY;  Surgeon: Will Bonnet, MD;  Location: [facility];  Service: Gynecology;  Laterality: N/A;   IR RADIOLOGIST EVAL & MGMT  07/10/2021   IR RADIOLOGIST EVAL & MGMT  08/11/2021   IR RADIOLOGIST EVAL & MGMT  09/09/2021   LAPAROSCOPIC LYSIS OF ADHESIONS  06/08/2016   Procedure: LAPAROSCOPIC LYSIS OF ADHESIONS;  Surgeon: Will Bonnet, MD;  Location: [facility];  Service: Gynecology;;   LAPAROSCOPIC SALPINGO OOPHERECTOMY Left 06/08/2016   Procedure: LAPAROSCOPIC SALPINGO OOPHORECTOMY;  Surgeon: Will Bonnet, MD;  Location: [facility];  Service: Gynecology;  Laterality: Left;   OOPHORECTOMY     left ovary   SALPINGECTOMY     left   SPINE SURGERY     lumbar spine 11/2015; cervical spine 2009. [facility].    Allergies: Demerol [meperidine], Kenalog [triamcinolone acetonide], and Adhesive [tape]  Medications: Prior to Admission medications   Medication Sig Start Date End Date Taking? Authorizing Provider  acetaminophen (TYLENOL) 325 MG tablet Take 650 mg by mouth every 6 (six) hours as needed for moderate pain or headache.    [provider]  buPROPion (WELLBUTRIN XL) 150 MG 24 hr tablet Take 2 tablets (300 mg total) by mouth daily. For depression. 03/24/21   [name], NP  busPIRone (BUSPAR) 5 MG tablet TAKE 1 TABLET (5 MG TOTAL) BY MOUTH 2 (TWO) TIMES DAILY. FOR ANXIETY 04/19/21   [name], NP  Cholecalciferol (VITAMIN D3 PO) Take 1 tablet by mouth daily. Unsure of mg    [provider]  ELDERBERRY PO Take 5 mLs by mouth daily.    [provider]  fluticasone (FLONASE) 50 MCG/ACT nasal spray Place 2 sprays into the nose daily as needed for allergies.     [provider]  Misc Natural Products (NEURIVA PO) Take 1 capsule by mouth daily.    [provider]  Multiple Vitamins-Minerals (MULTIVITAMIN WITH MINERALS) tablet Take 1 tablet by mouth daily. Centrum Silver for Women    [provider]  Omega-3 Fatty Acids (FISH OIL) 500 MG CAPS Take 500 mg by mouth daily.    [provider]  POTASSIUM PO Take 99 mg by mouth daily.    [provider]  pravastatin (PRAVACHOL) 40 MG tablet TAKE 1 TABLET BY MOUTH EVERY DAY FOR CHOLESTEROL Patient taking differently: Take 40 mg by mouth at bedtime. 05/17/21   [name], NP  vitamin B-12 (CYANOCOBALAMIN) 1000 MCG tablet Take 1,000 mcg by mouth daily.    [provider]     Family History  Problem Relation Age of Onset   Hyperlipidemia Mother    Hypertension Mother    Colon polyps Mother    Coronary artery disease Father         AMI age 33   Heart disease Father 25       AMI x 2; first AMI in 71s   Breast cancer Sister 20       with metastasis to bone; smoker   Cancer Sister 71       breast cancer   Colon cancer Maternal Uncle        dx. >50   Colon cancer Paternal Aunt        dx. 68s   Heart attack Paternal Uncle    Stroke Maternal Grandmother    Alzheimer's disease Paternal Grandmother    Heart disease Paternal Grandfather    Heart attack Paternal Grandfather    Healthy Daughter    Healthy Daughter    Breast cancer Cousin    Ovarian cancer Cousin        dx. late 70s/dx. 44s   Lung cancer Cousin        dx. 17s   Stomach cancer Other     Social History   Socioeconomic History   Marital status: Married    Spouse name: Earlena Werst   Number of children: 2   Years of education: Not on file   Highest education level: Not on file   Occupational History   Occupation: retired    Comment: Network engineer  Tobacco Use   Smoking status: Never    Passive exposure: Never   Smokeless tobacco: Never  Vaping Use   Vaping Use: Never used  Substance and Sexual Activity   Alcohol use: Yes    Comment: 2-3 beers per week or  wine   Drug use: No   Sexual activity: Yes    Birth control/protection: Surgical  Other Topics Concern   Not on file  Social History Narrative   Marital status:  Married x 14 years; second marriage;happily married,husband has PTSD.     Children: 2 daughters [name], [name]); 7 grandchildren; 2 gg      Lives: with husband, dog/beagle      Employment: retired since 2013; Network engineer      Tobacco: never      Alcohol: weekends; 1-2 glasses of wine per week or beer.      Drugs:  None      Exercise: none in 2018      Seatbelt: 100%      Guns: unloaded.     Caffeine not every day. 7 grandchildren and 2 GG. Organ donor NO.      ADLs: independent with ADLs; drives      Advanced Directives: YES; FULL CODE no prolonged measures.   Social Determinants of Health   Financial Resource Strain: Low Risk  (09/29/2021)   Overall Financial Resource Strain (CARDIA)    Difficulty of Paying Living Expenses: Not hard at all  Food Insecurity: No Food Insecurity (09/29/2021)   Hunger Vital Sign    Worried About Running Out of Food in the Last Year: Never true    Ran Out of Food in the Last Year: Never true  Transportation Needs: No Transportation Needs (09/29/2021)   PRAPARE - Hydrologist (Medical): No    Lack of Transportation (Non-Medical): No  Physical Activity: Insufficiently Active (09/29/2021)   Exercise Vital Sign    Days of Exercise per Week: 2 days    Minutes of Exercise per Session: 20 min  Stress: No Stress Concern Present (09/29/2021)   [place]    Feeling of Stress : Only a little  Social Connections: Moderately  Integrated (09/29/2021)   Social Connection and Isolation Panel [NHANES]    Frequency of Communication with Friends and Family: More than three times a week    Frequency of Social Gatherings with Friends and Family: Once a week    Attends Religious Services: More than 4 times per year    Active Member of [employer] or Organizations: No    Attends Archivist Meetings: Never    Marital Status: Married      Review of Systems: A 12 point ROS discussed and pertinent positives are indicated in the HPI above.  All other systems are negative.  Review of Systems  Vital Signs: There were no vitals taken for this visit.  Advance Care Plan: The advanced care plan/surrogate decision maker was discussed at the time of visit and documented in the medical record.    Physical Exam  Targeted exam of the drain site shows no concern for infection.  Clean, dry, with no erythema or drainage. Drain is functional.  Mallampati Score:     Imaging: DG Sinus/Fist Tube Chk-Non GI  Result Date: 09/09/2021 INDICATION: 71 year old woman with perforated diverticulitis requiring percutaneous CT-guided abscess drain placed on 06/26/2021 returns to IR clinic for follow-up. EXAM: Fluoroscopic abscessogram MEDICATIONS: None ANESTHESIA/SEDATION: None COMPLICATIONS: None immediate. PROCEDURE: Informed written consent was obtained from the patient after a thorough discussion of the procedural risks, benefits and alternatives. A timeout was performed prior to the initiation of the procedure. Scout image showed the pelvic drain in unchanged position. Contrast administered through the drain showed no residual  abscess cavity, however there is prompt fistulous communication with sigmoid colon. Drain flushed and reattached to bag. IMPRESSION: Pelvic abscessogram demonstrates continued fistulous communication with sigmoid colon. PLAN: 1. Patient instructed again to not flush drain. 2. Remain attached to bag. 3. Return in 4 weeks  for abscess drain injection. Electronically Signed   By: Miachel Roux M.D.   On: 09/09/2021 15:01   IR Radiologist Eval & Mgmt  Result Date: 09/09/2021 Please refer to notes tab for details about interventional procedure. (Op Note)   Labs:  CBC: Recent Labs    06/23/21 0100 06/24/21 0210 06/25/21 0130 06/26/21 0259  WBC 4.2 2.9* 2.8* 3.4*  HGB 9.7* 10.5* 10.7* 10.6*  HCT 28.5* 31.7* 31.9* 32.3*  PLT 286 287 335 382    COAGS: Recent Labs    06/20/21 1417  INR 1.3*    BMP: Recent Labs    06/23/21 0100 06/24/21 0210 06/25/21 0130 06/26/21 0259  NA 139 141 140 135  K 3.9 3.2* 2.9* 4.1  CL 106 107 106 106  CO2 '23 26 27 24  '$ GLUCOSE 79 103* 94 114*  BUN 8 <5* <5* <5*  CALCIUM 7.9* 8.0* 8.4* 8.4*  CREATININE 0.55 0.47 0.48 0.52  GFRNONAA >60 >60 >60 >60    LIVER FUNCTION TESTS: Recent Labs    03/24/21 1159 06/20/21 1417 06/21/21 0500  BILITOT 0.4 0.8 0.8  AST 17 15 12*  ALT '15 11 9  '$ ALKPHOS 46 50 43  PROT 7.4 7.3 6.0*  ALBUMIN 4.5 3.3* 2.6*    TUMOR MARKERS: No results for input(s): "AFPTM", "CEA", "CA199", "CHROMGRNA" in the last 8760 hours.  Assessment and Plan:  I spoke with [name] and her husband today mainly about trying to facilitate healing, such that the plan outlined by surgery can progress.   I did let her know that I think GI would be hesitant to perform colonoscopy with persisting fistula, and that this might be obstacle to have any surgery performed in the near future.    She does have appointment with surgery at the end of August.   I proposed, today, that we change the drain at the hospital, so that we can potentially disrupt any connection to the fistula and give the bowel a chance to heal.  I think I would give it about 2 weeks after that before another injection.    I think we need to try something different in order to try and get some progress with her care.   They understand and agree.   Plan: - We will schedule for drain  injection/exchange at [facility] to change the position of the drain and attempt fistula healing.   Electronically Signed: Corrie Mckusick 10/09/2021, 1:17 PM   I spent a total of    15 Minutes in face to face in clinical consultation, greater than 50% of which was counseling/coordinating care for abscess drain, possible drain injection/repositioning

## 2021-10-13 NOTE — Telephone Encounter (Signed)
Colonoscopy with Dr. Earlean Shawl 04/07/2015 showed pancolonic diverticulosis and internal hemorrhoids.  Repeat colonoscopy recommended in 10 years.  Recent perforated diverticulitis presumably with colo-- drain fistula. Dr. Dema Severin has recommended colonoscopy prior to surgery.

## 2021-10-13 NOTE — Telephone Encounter (Signed)
Dr. Estanislado Pandy,   Any objections for patient to proceed with her regularly scheduled Prolia injection in light of her ongoing fistula secondary to diverticulitis with intra-abdominal abscess from May 2023?  From chart review in June 2023 her abscess was resolved.  I appreciate any assistance you can provide, thanks! Allie Bossier, NP-C

## 2021-10-13 NOTE — Telephone Encounter (Signed)
Dr. Tarri Glenn,  Any objections to patient receiving her regularly scheduled Prolia injection for bone density maintenance?  Just wanted to make sure, especially in light of her diverticulitis and residual fistula.   Thanks so much! Allie Bossier, NP-C [place]

## 2021-10-13 NOTE — Telephone Encounter (Signed)
OOP cost is $0  Tracy Estrada, is patient ok to proceed with labs and prolia injection? Wanted to make sure in setting of ER visit in June and recent abscess drainage.

## 2021-10-14 ENCOUNTER — Telehealth (HOSPITAL_COMMUNITY): Payer: Self-pay

## 2021-10-14 ENCOUNTER — Other Ambulatory Visit (HOSPITAL_COMMUNITY): Payer: Self-pay | Admitting: Interventional Radiology

## 2021-10-14 DIAGNOSIS — K5792 Diverticulitis of intestine, part unspecified, without perforation or abscess without bleeding: Secondary | ICD-10-CM

## 2021-10-14 NOTE — Telephone Encounter (Signed)
There the drug information there is mention about increased lower extremity infections .  I have not noticed an increased risk of infection in the patient's on Prolia.  There is no contraindication listed on Prolia if the patient has an infection.

## 2021-10-14 NOTE — Telephone Encounter (Signed)
Left message to call back  

## 2021-10-14 NOTE — Telephone Encounter (Signed)
Called to schedule drain exchange, no answer, left vm. AW 

## 2021-10-14 NOTE — Telephone Encounter (Addendum)
Noted, appreciate Dr. Arlean Hopping input. Please move forward with scheduled Prolia injection to prevent rebound osteoporosis.  Especially since her intra-abdominal abscess has resolved.

## 2021-10-15 NOTE — Telephone Encounter (Signed)
Patient called returning call she missed. Thank you!

## 2021-10-16 NOTE — Telephone Encounter (Signed)
Left message to call back  

## 2021-10-16 NOTE — Telephone Encounter (Signed)
Patient advised. Lab on 8/31 and NV 11/10/21-patient will speak with her surgeon on 11/03/21 also to make sure she is ok to proceed with this. Patient could not come sooner due to been out of town next week and other appointments.

## 2021-10-16 NOTE — Addendum Note (Signed)
Addended by: Kris Mouton on: 10/16/2021 11:01 AM   Modules accepted: Orders

## 2021-10-19 NOTE — Telephone Encounter (Signed)
PT called back to schedule. Advised that I could schedule per the notations but she would much rather speak to [place] to schedule. Please advise. Thank you.

## 2021-10-21 ENCOUNTER — Other Ambulatory Visit (HOSPITAL_COMMUNITY): Payer: Self-pay | Admitting: Interventional Radiology

## 2021-10-21 ENCOUNTER — Ambulatory Visit (HOSPITAL_COMMUNITY)
Admission: RE | Admit: 2021-10-21 | Discharge: 2021-10-21 | Disposition: A | Discharge status: Home / Self Care | Payer: Medicare Other | Source: Ambulatory Visit | Attending: Interventional Radiology | Admitting: Interventional Radiology

## 2021-10-21 DIAGNOSIS — K632 Fistula of intestine: Secondary | ICD-10-CM | POA: Diagnosis not present

## 2021-10-21 DIAGNOSIS — T85638A Leakage of other specified internal prosthetic devices, implants and grafts, initial encounter: Secondary | ICD-10-CM | POA: Diagnosis not present

## 2021-10-21 DIAGNOSIS — Z4803 Encounter for change or removal of drains: Secondary | ICD-10-CM | POA: Diagnosis not present

## 2021-10-21 DIAGNOSIS — K5792 Diverticulitis of intestine, part unspecified, without perforation or abscess without bleeding: Secondary | ICD-10-CM | POA: Insufficient documentation

## 2021-10-21 HISTORY — PX: IR CATHETER TUBE CHANGE: IMG717

## 2021-10-21 MED ORDER — IOHEXOL 300 MG/ML  SOLN
100.0000 mL | Freq: Once | INTRAMUSCULAR | Status: AC | PRN
  Administered 2021-10-21: 10 mL

## 2021-10-21 MED ORDER — LIDOCAINE HCL 1 % IJ SOLN
INTRAMUSCULAR | Status: AC
  Administered 2021-10-21: 10 mL
  Filled 2021-10-21: qty 20

## 2021-10-21 NOTE — Procedures (Signed)
Pre procedural Dx: Chronic diverticular drain Post procedural Dx: Same  Technically successful CT guided exchange of chronic diverticular abscess drainage catheter.    EBL: Trace Complications: None immediate  Ronny Bacon, MD Pager #: [phone]

## 2021-10-22 ENCOUNTER — Other Ambulatory Visit: Payer: Self-pay | Admitting: Surgery

## 2021-10-22 DIAGNOSIS — L0291 Cutaneous abscess, unspecified: Secondary | ICD-10-CM

## 2021-11-02 ENCOUNTER — Ambulatory Visit
Admission: RE | Admit: 2021-11-02 | Discharge: 2021-11-02 | Disposition: A | Discharge status: Home / Self Care | Payer: Medicare Other | Source: Ambulatory Visit | Attending: Surgery | Admitting: Surgery

## 2021-11-02 ENCOUNTER — Other Ambulatory Visit: Payer: Self-pay | Admitting: Student

## 2021-11-02 ENCOUNTER — Ambulatory Visit (INDEPENDENT_AMBULATORY_CARE_PROVIDER_SITE_OTHER): Payer: Medicare Other | Admitting: Primary Care

## 2021-11-02 ENCOUNTER — Ambulatory Visit (INDEPENDENT_AMBULATORY_CARE_PROVIDER_SITE_OTHER)
Admission: RE | Admit: 2021-11-02 | Discharge: 2021-11-02 | Disposition: A | Discharge status: Home / Self Care | Payer: Medicare Other | Source: Ambulatory Visit | Attending: Primary Care | Admitting: Primary Care

## 2021-11-02 VITALS — BP 110/60 | HR 85 | Temp 98.6°F | Ht 60.0 in | Wt 116.0 lb

## 2021-11-02 DIAGNOSIS — M25562 Pain in left knee: Secondary | ICD-10-CM | POA: Insufficient documentation

## 2021-11-02 DIAGNOSIS — K651 Peritoneal abscess: Secondary | ICD-10-CM

## 2021-11-02 DIAGNOSIS — Z4682 Encounter for fitting and adjustment of non-vascular catheter: Secondary | ICD-10-CM | POA: Diagnosis not present

## 2021-11-02 DIAGNOSIS — L0291 Cutaneous abscess, unspecified: Secondary | ICD-10-CM

## 2021-11-02 DIAGNOSIS — K632 Fistula of intestine: Secondary | ICD-10-CM | POA: Diagnosis not present

## 2021-11-02 HISTORY — PX: IR RADIOLOGIST EVAL & MGMT: IMG5224

## 2021-11-02 NOTE — Patient Instructions (Signed)
Complete xray(s) prior to leaving today. I will notify you of your results once received.  You may take Ibuprofen 400 mg every 8 hours as needed for pain/inflammation. You can alternate with Tylenol as needed.  Consider seeing our Sports Medicine doctor or your rheumatologist for further evaluation. We can also consider physical therapy.  Wear a knee sleeve/brace for stability.  Try to walk when possible.  It was a pleasure to see you today!

## 2021-11-02 NOTE — Assessment & Plan Note (Signed)
Could be osteoarthritis flare vs fibromyalgia.  Negative for gout last year, presentation today doesn't support gout diagnosis. No obvious effusion or warmth to suggest infection.  Discussed to add 400 mg of Ibuprofen with food every 8 hours, alternate with Tylenol if needed. Discussed to wear a knee sleeve/brace for support.  Discussed walking daily as tolerated.  Offered physical therapy, she kindly declines.  Xrays pending today.

## 2021-11-02 NOTE — Progress Notes (Signed)
Referring Physician(s): White,Christopher M  Chief Complaint: The patient is seen in follow up today s/p CT guided abscess drain placement on 06/21/21 with exchange on 10/21/21 for treatment of diverticular abscess  History of present illness:  Ms Valentino Saxon is a 71 yo female being seen today as a follow-up in clinic with LLQ drain connected to gravity bag in place. Patient initially presented to the ED on 06/20/21 for abdominal pain x 1 week. She was found to have acute sigmoid diverticulitis with intramural abscess along the mid-sigmoid colon. A LLQ 12 Fr anterior pelvis abscess drain was initially placed on 06/21/21.  Patient has been seen in IR drain clinic on 07/10/21, 08/11/21, 09/09/21, 10/09/21, and had her drain exchanged by Dr Pascal Lux on 10/21/21. She reports daily flushing of her drain, with 25-30cc of drainage daily. She additionally reports that she is experiencing a significant emotional burden with this drain in place, reporting daily crying. She has a follow-up appointment with her surgeon Dr Dema Severin tomorrow.   Past Medical History:  Diagnosis Date   Achilles bursitis or tendinitis    Anxiety state, unspecified    Arthritis    Closed fracture of unspecified part of fibula    Depressive disorder, not elsewhere classified    Diverticulosis of colon (without mention of hemorrhage)    Edema    Fibromyalgia    Insomnia, unspecified    Lumbago    Mass of left ovary 06/01/2016   Meniere's disease 1986   MVP (mitral valve prolapse)    Other intra-abdominal and pelvic swelling, mass and lump 06/01/2016   Pain in joint, site unspecified    Peptic ulcer    no testing   Pure hypercholesterolemia    TSH elevation     Past Surgical History:  Procedure Laterality Date   ABDOMINAL HYSTERECTOMY  03/08/1980   fibroids; ovaries intact.   BACK SURGERY     x2 Cervical and lumbar   CYSTOSCOPY N/A 06/08/2016   Procedure: CYSTOSCOPY;  Surgeon: Will Bonnet, MD;  Location: [facility];  Service:  Gynecology;  Laterality: N/A;   IR CATHETER TUBE CHANGE  10/21/2021   IR RADIOLOGIST EVAL & MGMT  07/10/2021   IR RADIOLOGIST EVAL & MGMT  08/11/2021   IR RADIOLOGIST EVAL & MGMT  09/09/2021   IR RADIOLOGIST EVAL & MGMT  10/09/2021   IR RADIOLOGIST EVAL & MGMT  11/02/2021   LAPAROSCOPIC LYSIS OF ADHESIONS  06/08/2016   Procedure: LAPAROSCOPIC LYSIS OF ADHESIONS;  Surgeon: Will Bonnet, MD;  Location: [facility];  Service: Gynecology;;   LAPAROSCOPIC SALPINGO OOPHERECTOMY Left 06/08/2016   Procedure: LAPAROSCOPIC SALPINGO OOPHORECTOMY;  Surgeon: Will Bonnet, MD;  Location: [facility];  Service: Gynecology;  Laterality: Left;   OOPHORECTOMY     left ovary   SALPINGECTOMY     left   SPINE SURGERY     lumbar spine 11/2015; cervical spine 2009. [facility].    Allergies: Demerol [meperidine], Kenalog [triamcinolone acetonide], and Adhesive [tape]  Medications: Prior to Admission medications   Medication Sig Start Date End Date Taking? Authorizing Provider  acetaminophen (TYLENOL) 325 MG tablet Take 650 mg by mouth every 6 (six) hours as needed for moderate pain or headache.    [provider]  buPROPion (WELLBUTRIN XL) 150 MG 24 hr tablet Take 2 tablets (300 mg total) by mouth daily. For depression. 03/24/21   [name], NP  busPIRone (BUSPAR) 5 MG tablet TAKE 1 TABLET (5 MG TOTAL) BY MOUTH 2 (TWO) TIMES  DAILY. FOR ANXIETY 04/19/21   [name], NP  Cholecalciferol (VITAMIN D3 PO) Take 1 tablet by mouth daily. Unsure of mg    [provider]  ELDERBERRY PO Take 5 mLs by mouth daily.    [provider]  fluticasone (FLONASE) 50 MCG/ACT nasal spray Place 2 sprays into the nose daily as needed for allergies.     [provider]  Misc Natural Products (NEURIVA PO) Take 1 capsule by mouth daily.    [provider]  Multiple Vitamins-Minerals (MULTIVITAMIN WITH MINERALS) tablet Take 1 tablet by mouth daily. Centrum Silver for Women    [provider]  Omega-3 Fatty Acids (FISH OIL) 500 MG CAPS Take 500 mg by mouth daily.    [provider]  POTASSIUM PO Take 99 mg by mouth daily.    [provider]  pravastatin (PRAVACHOL) 40 MG tablet TAKE 1 TABLET BY MOUTH EVERY DAY FOR CHOLESTEROL Patient taking differently: Take 40 mg by mouth at bedtime. 05/17/21   [name], NP  vitamin B-12 (CYANOCOBALAMIN) 1000 MCG tablet Take 1,000 mcg by mouth daily.    [provider]     Family History  Problem Relation Age of Onset   Hyperlipidemia Mother    Hypertension Mother    Colon polyps Mother    Coronary artery disease Father         AMI age 4   Heart disease Father 34       AMI x 2; first AMI in 73s   Breast cancer Sister 12       with metastasis to bone; smoker   Cancer Sister 28       breast cancer   Colon cancer Maternal Uncle        dx. >50   Colon cancer Paternal Aunt        dx. 37s   Heart attack Paternal Uncle    Stroke Maternal Grandmother    Alzheimer's disease Paternal Grandmother    Heart disease Paternal Grandfather    Heart attack Paternal Grandfather    Healthy Daughter    Healthy Daughter    Breast cancer Cousin    Ovarian cancer Cousin        dx. late 70s/dx. 38s   Lung cancer Cousin        dx. 4s   Stomach cancer Other     Social History   Socioeconomic History   Marital status: Married    Spouse name: Evette Diclemente   Number of children: 2   Years of education: Not on file   Highest education level: Not on file  Occupational History   Occupation: retired    Comment: Network engineer  Tobacco Use   Smoking status: Never    Passive exposure: Never   Smokeless tobacco: Never  Vaping Use   Vaping Use: Never used  Substance and Sexual Activity   Alcohol use: Yes    Comment: 2-3 beers per week or wine   Drug use: No   Sexual activity: Yes    Birth control/protection: Surgical  Other Topics Concern   Not on file  Social History Narrative   Marital status:   Married x 14 years; second marriage;happily married,husband has PTSD.     Children: 2 daughters [name], [name]); 7 grandchildren; 2 gg      Lives: with husband, dog/beagle      Employment: retired since 2013; Network engineer      Tobacco: never  Alcohol: weekends; 1-2 glasses of wine per week or beer.      Drugs:  None      Exercise: none in 2018      Seatbelt: 100%      Guns: unloaded.     Caffeine not every day. 7 grandchildren and 2 GG. Organ donor NO.      ADLs: independent with ADLs; drives      Advanced Directives: YES; FULL CODE no prolonged measures.   Social Determinants of Health   Financial Resource Strain: Low Risk  (09/29/2021)   Overall Financial Resource Strain (CARDIA)    Difficulty of Paying Living Expenses: Not hard at all  Food Insecurity: No Food Insecurity (09/29/2021)   Hunger Vital Sign    Worried About Running Out of Food in the Last Year: Never true    Ran Out of Food in the Last Year: Never true  Transportation Needs: No Transportation Needs (09/29/2021)   PRAPARE - Hydrologist (Medical): No    Lack of Transportation (Non-Medical): No  Physical Activity: Insufficiently Active (09/29/2021)   Exercise Vital Sign    Days of Exercise per Week: 2 days    Minutes of Exercise per Session: 20 min  Stress: No Stress Concern Present (09/29/2021)   [place]    Feeling of Stress : Only a little  Social Connections: Moderately Integrated (09/29/2021)   Social Connection and Isolation Panel [NHANES]    Frequency of Communication with Friends and Family: More than three times a week    Frequency of Social Gatherings with Friends and Family: Once a week    Attends Religious Services: More than 4 times per year    Active Member of [employer] or Organizations: No    Attends Archivist Meetings: Never    Marital Status: Married     Vital Signs: BP 125/63   Pulse 74   Temp  98.7 F (37.1 C)   SpO2 98%   Physical Exam Constitutional:      General: She is not in acute distress. HENT:     Head: Normocephalic and atraumatic.  Pulmonary:     Effort: Pulmonary effort is normal.  Abdominal:     Comments: LLQ drain in place. Insertion site unremarkable, with drain secured in place by suture and stat lock.   Neurological:     Mental Status: She is alert and oriented to person, place, and time.  Psychiatric:        Mood and Affect: Mood normal.        Behavior: Behavior normal.        Thought Content: Thought content normal.        Judgment: Judgment normal.     Imaging: IR Radiologist Eval & Mgmt  Result Date: 11/02/2021 Please refer to notes tab for details about interventional procedure. (Op Note)   Labs:  CBC: Recent Labs    06/23/21 0100 06/24/21 0210 06/25/21 0130 06/26/21 0259  WBC 4.2 2.9* 2.8* 3.4*  HGB 9.7* 10.5* 10.7* 10.6*  HCT 28.5* 31.7* 31.9* 32.3*  PLT 286 287 335 382    COAGS: Recent Labs    06/20/21 1417  INR 1.3*    BMP: Recent Labs    06/23/21 0100 06/24/21 0210 06/25/21 0130 06/26/21 0259  NA 139 141 140 135  K 3.9 3.2* 2.9* 4.1  CL 106 107 106 106  CO2 '23 26 27 24  '$ GLUCOSE 79 103* 94  114*  BUN 8 <5* <5* <5*  CALCIUM 7.9* 8.0* 8.4* 8.4*  CREATININE 0.55 0.47 0.48 0.52  GFRNONAA >60 >60 >60 >60    LIVER FUNCTION TESTS: Recent Labs    03/24/21 1159 06/20/21 1417 06/21/21 0500  BILITOT 0.4 0.8 0.8  AST 17 15 12*  ALT '15 11 9  '$ ALKPHOS 46 50 43  PROT 7.4 7.3 6.0*  ALBUMIN 4.5 3.3* 2.6*    Assessment:  Tracy Estrada is a 71 yo female with history of pelvic diverticular abscess s/p image-guided drain placement on 06/21/21 and exchange on 10/21/21 who is returning to IR clinic today for drain follow-up. She has reported 25-30cc of daily output from her drain. During drain injection performed under fluoroscopy today, a small persistent fistula was noted, with improvement noted compared to study  performed on 10/09/21. Patient will be seen by their surgeon Dr Dema Severin tomorrow on 11/03/21.   Signed: Lura Em, PA-C 11/02/2021, 10:39 AM   Please refer to Dr. Malachi Carl attestation of this note for management and plan.

## 2021-11-02 NOTE — Progress Notes (Signed)
Subjective:    Patient ID: Tracy Estrada, female    DOB: June 06, 1950, 71 y.o.   MRN: [id number]  Knee Pain     Tracy Estrada is a very pleasant 71 y.o. female with a history of osteoporosis, fibromyalgia, chronic right shoulder pain who presents today to discuss knee pain.  Her pain is located to the left lateal knee with intermittent radiation down to her left lateral ankle. Pain is worse with movement such as walking and when rising from a seated position after sitting for prolonged periods of time. She has noticed weakness to the left lower extremity at times. She is using a walker when feeling unstable.   Symptom onset 1-2 weeks ago. She denies redness, swelling, injury/trauma, pain to her right knee. She's been taking Tylenol with some improvement. She tested negative for gout in 2022. Follows with rheumatology, has not mentioned symptoms.    Review of Systems  Musculoskeletal:  Positive for arthralgias. Negative for joint swelling.  Skin:  Negative for color change.  Neurological:  Positive for weakness.         Past Medical History:  Diagnosis Date   Achilles bursitis or tendinitis    Anxiety state, unspecified    Arthritis    Closed fracture of unspecified part of fibula    Depressive disorder, not elsewhere classified    Diverticulosis of colon (without mention of hemorrhage)    Edema    Fibromyalgia    Insomnia, unspecified    Lumbago    Mass of left ovary 06/01/2016   Meniere's disease 1986   MVP (mitral valve prolapse)    Other intra-abdominal and pelvic swelling, mass and lump 06/01/2016   Pain in joint, site unspecified    Peptic ulcer    no testing   Pure hypercholesterolemia    TSH elevation     Social History   Socioeconomic History   Marital status: Married    Spouse name: Francine Hannan   Number of children: 2   Years of education: Not on file   Highest education level: Not on file  Occupational History   Occupation: retired    Comment:  Network engineer  Tobacco Use   Smoking status: Never    Passive exposure: Never   Smokeless tobacco: Never  Vaping Use   Vaping Use: Never used  Substance and Sexual Activity   Alcohol use: Yes    Comment: 2-3 beers per week or wine   Drug use: No   Sexual activity: Yes    Birth control/protection: Surgical  Other Topics Concern   Not on file  Social History Narrative   Marital status:  Married x 14 years; second marriage;happily married,husband has PTSD.     Children: 2 daughters Tracy Estrada, Tracy Estrada); 7 grandchildren; 2 gg      Lives: with husband, dog/beagle      Employment: retired since 2013; Network engineer      Tobacco: never      Alcohol: weekends; 1-2 glasses of wine per week or beer.      Drugs:  None      Exercise: none in 2018      Seatbelt: 100%      Guns: unloaded.     Caffeine not every day. 7 grandchildren and 2 GG. Organ donor NO.      ADLs: independent with ADLs; drives      Advanced Directives: YES; FULL CODE no prolonged measures.   Social Determinants of Health   Financial Resource Strain: Low Risk  (09/29/2021)  Overall Financial Resource Strain (CARDIA)    Difficulty of Paying Living Expenses: Not hard at all  Food Insecurity: No Food Insecurity (09/29/2021)   Hunger Vital Sign    Worried About Running Out of Food in the Last Year: Never true    Ran Out of Food in the Last Year: Never true  Transportation Needs: No Transportation Needs (09/29/2021)   PRAPARE - Hydrologist (Medical): No    Lack of Transportation (Non-Medical): No  Physical Activity: Insufficiently Active (09/29/2021)   Exercise Vital Sign    Days of Exercise per Week: 2 days    Minutes of Exercise per Session: 20 min  Stress: No Stress Concern Present (09/29/2021)   [place]    Feeling of Stress : Only a little  Social Connections: Moderately Integrated (09/29/2021)   Social Connection and Isolation Panel  [NHANES]    Frequency of Communication with Friends and Family: More than three times a week    Frequency of Social Gatherings with Friends and Family: Once a week    Attends Religious Services: More than 4 times per year    Active Member of [employer] or Organizations: No    Attends Archivist Meetings: Never    Marital Status: Married  Human resources officer Violence: Not At Risk (09/29/2021)   Humiliation, Afraid, Rape, and Kick questionnaire    Fear of Current or Ex-Partner: No    Emotionally Abused: No    Physically Abused: No    Sexually Abused: No    Past Surgical History:  Procedure Laterality Date   ABDOMINAL HYSTERECTOMY  03/08/1980   fibroids; ovaries intact.   BACK SURGERY     x2 Cervical and lumbar   CYSTOSCOPY N/A 06/08/2016   Procedure: CYSTOSCOPY;  Surgeon: Will Bonnet, MD;  Location: [facility];  Service: Gynecology;  Laterality: N/A;   IR CATHETER TUBE CHANGE  10/21/2021   IR RADIOLOGIST EVAL & MGMT  07/10/2021   IR RADIOLOGIST EVAL & MGMT  08/11/2021   IR RADIOLOGIST EVAL & MGMT  09/09/2021   IR RADIOLOGIST EVAL & MGMT  10/09/2021   IR RADIOLOGIST EVAL & MGMT  11/02/2021   LAPAROSCOPIC LYSIS OF ADHESIONS  06/08/2016   Procedure: LAPAROSCOPIC LYSIS OF ADHESIONS;  Surgeon: Will Bonnet, MD;  Location: [facility];  Service: Gynecology;;   LAPAROSCOPIC SALPINGO OOPHERECTOMY Left 06/08/2016   Procedure: LAPAROSCOPIC SALPINGO OOPHORECTOMY;  Surgeon: Will Bonnet, MD;  Location: [facility];  Service: Gynecology;  Laterality: Left;   OOPHORECTOMY     left ovary   SALPINGECTOMY     left   SPINE SURGERY     lumbar spine 11/2015; cervical spine 2009. [facility].    Family History  Problem Relation Age of Onset   Hyperlipidemia Mother    Hypertension Mother    Colon polyps Mother    Coronary artery disease Father         AMI age 17   Heart disease Father 83       AMI x 2; first AMI in 72s   Breast cancer Sister 49       with metastasis to bone; smoker   Cancer Sister 6        breast cancer   Colon cancer Maternal Uncle        dx. >50   Colon cancer Paternal Aunt        dx. 80s   Heart attack Paternal Uncle  Stroke Maternal Grandmother    Alzheimer's disease Paternal Grandmother    Heart disease Paternal Grandfather    Heart attack Paternal Grandfather    Healthy Daughter    Healthy Daughter    Breast cancer Cousin    Ovarian cancer Cousin        dx. late 70s/dx. 69s   Lung cancer Cousin        dx. 70s   Stomach cancer Other     Allergies  Allergen Reactions   Demerol [Meperidine] Nausea And Vomiting   Kenalog [Triamcinolone Acetonide] Other (See Comments)    Cause vertigo/dizziness   Adhesive [Tape] Rash and Other (See Comments)    PAPER TAPE OK    Current Outpatient Medications on File Prior to Visit  Medication Sig Dispense Refill   acetaminophen (TYLENOL) 325 MG tablet Take 650 mg by mouth every 6 (six) hours as needed for moderate pain or headache.     buPROPion (WELLBUTRIN XL) 150 MG 24 hr tablet Take 2 tablets (300 mg total) by mouth daily. For depression. 180 tablet 3   busPIRone (BUSPAR) 5 MG tablet TAKE 1 TABLET (5 MG TOTAL) BY MOUTH 2 (TWO) TIMES DAILY. FOR ANXIETY 180 tablet 1   Cholecalciferol (VITAMIN D3 PO) Take 1 tablet by mouth daily. Unsure of mg     fluticasone (FLONASE) 50 MCG/ACT nasal spray Place 2 sprays into the nose daily as needed for allergies.      Multiple Vitamins-Minerals (MULTIVITAMIN WITH MINERALS) tablet Take 1 tablet by mouth daily. Centrum Silver for Women     Omega-3 Fatty Acids (FISH OIL) 500 MG CAPS Take 500 mg by mouth daily.     POTASSIUM PO Take 99 mg by mouth daily.     pravastatin (PRAVACHOL) 40 MG tablet TAKE 1 TABLET BY MOUTH EVERY DAY FOR CHOLESTEROL (Patient taking differently: Take 40 mg by mouth at bedtime.) 90 tablet 2   vitamin B-12 (CYANOCOBALAMIN) 1000 MCG tablet Take 1,000 mcg by mouth daily.     ELDERBERRY PO Take 5 mLs by mouth daily. (Patient not taking: Reported on 11/02/2021)      Misc Natural Products (NEURIVA PO) Take 1 capsule by mouth daily. (Patient not taking: Reported on 11/02/2021)     No current facility-administered medications on file prior to visit.    BP 110/60   Pulse 85   Temp 98.6 F (37 C) (Oral)   Ht 5' (1.524 m)   Wt 116 lb (52.6 kg)   SpO2 96%   BMI 22.65 kg/m  Objective:   Physical Exam Constitutional:      General: She is not in acute distress. Pulmonary:     Effort: Pulmonary effort is normal.  Musculoskeletal:     Right knee: No swelling.     Left knee: No swelling, deformity, erythema or bony tenderness. Decreased range of motion. No tenderness. No LCL laxity.      Legs:  Skin:    General: Skin is warm and dry.  Neurological:     Mental Status: She is alert.           Assessment & Plan:   Problem List Items Addressed This Visit       Other   Acute pain of left knee - Primary    Could be osteoarthritis flare vs fibromyalgia.  Negative for gout last year, presentation today doesn't support gout diagnosis. No obvious effusion or warmth to suggest infection.  Discussed to add 400 mg of Ibuprofen with food every 8 hours,  alternate with Tylenol if needed. Discussed to wear a knee sleeve/brace for support.  Discussed walking daily as tolerated.  Offered physical therapy, she kindly declines.  Xrays pending today.       Relevant Orders   DG Knee Complete 4 Views Left       [name], NP

## 2021-11-03 ENCOUNTER — Other Ambulatory Visit: Payer: Self-pay | Admitting: Surgery

## 2021-11-03 DIAGNOSIS — K5720 Diverticulitis of large intestine with perforation and abscess without bleeding: Secondary | ICD-10-CM | POA: Diagnosis not present

## 2021-11-03 DIAGNOSIS — K651 Peritoneal abscess: Secondary | ICD-10-CM

## 2021-11-03 NOTE — Telephone Encounter (Signed)
Tracy Estrada, this was lab appt for Prolia and nurse visit for Prolia. Ok to cancel?

## 2021-11-05 ENCOUNTER — Other Ambulatory Visit: Payer: Medicare Other

## 2021-11-06 ENCOUNTER — Other Ambulatory Visit: Payer: Self-pay | Admitting: Primary Care

## 2021-11-06 DIAGNOSIS — F32A Depression, unspecified: Secondary | ICD-10-CM

## 2021-11-10 ENCOUNTER — Ambulatory Visit: Payer: Medicare Other

## 2021-11-19 ENCOUNTER — Ambulatory Visit (AMBULATORY_SURGERY_CENTER): Payer: Self-pay

## 2021-11-19 VITALS — Ht 60.0 in | Wt 114.0 lb

## 2021-11-19 DIAGNOSIS — Z1211 Encounter for screening for malignant neoplasm of colon: Secondary | ICD-10-CM

## 2021-11-19 DIAGNOSIS — K5792 Diverticulitis of intestine, part unspecified, without perforation or abscess without bleeding: Secondary | ICD-10-CM

## 2021-11-19 NOTE — Progress Notes (Signed)
No egg or soy allergy known to patient  No issues known to pt with past sedation with any surgeries or procedures Patient denies ever being told they had issues or difficulty with intubation  No FH of Malignant Hyperthermia Pt is not on diet pills Pt is not on  home 02  Pt is not on blood thinners  Pt denies issues with constipation  No A fib or A flutter Have any cardiac testing pending--denied Pt instructed to use Single[employer] or GoodRx for a price reduction on prep   

## 2021-11-24 ENCOUNTER — Ambulatory Visit
Admission: RE | Admit: 2021-11-24 | Discharge: 2021-11-24 | Disposition: A | Discharge status: Home / Self Care | Payer: Medicare Other | Source: Ambulatory Visit | Attending: Surgery | Admitting: Surgery

## 2021-11-24 ENCOUNTER — Ambulatory Visit
Admission: RE | Admit: 2021-11-24 | Discharge: 2021-11-24 | Disposition: A | Discharge status: Home / Self Care | Payer: Medicare Other | Source: Ambulatory Visit | Attending: Student | Admitting: Student

## 2021-11-24 DIAGNOSIS — K5720 Diverticulitis of large intestine with perforation and abscess without bleeding: Secondary | ICD-10-CM | POA: Diagnosis not present

## 2021-11-24 DIAGNOSIS — K651 Peritoneal abscess: Secondary | ICD-10-CM

## 2021-11-24 DIAGNOSIS — Z4682 Encounter for fitting and adjustment of non-vascular catheter: Secondary | ICD-10-CM | POA: Diagnosis not present

## 2021-11-24 DIAGNOSIS — K632 Fistula of intestine: Secondary | ICD-10-CM | POA: Diagnosis not present

## 2021-11-24 HISTORY — PX: IR RADIOLOGIST EVAL & MGMT: IMG5224

## 2021-11-24 NOTE — Procedures (Signed)
Interventional Radiology Procedure Note  Procedure: Drain injection Findings: No abscess. Persisting fistula to the colon. .  Complications: None Recommendations:  - continue current drain care - Do not submerge   Signed,  Dulcy Fanny. Earleen Newport, DO

## 2021-11-24 NOTE — Progress Notes (Signed)
Referring Physician(s): White,Christopher M   Chief Complaint: The patient is seen in follow up today s/p CT guided abscess drain placement on 06/21/21 with exchange on 10/21/21 for treatment of diverticular abscess   History of present illness:   Tracy Estrada is a 71 yo female presenting to the clinic with an indwelling diverticular abscess drain.  This is scheduled follow up for routine injection, as she has known fistula to the colon.   Hx: Patient initially presented to the ED on 06/20/21 for abdominal pain x 1 week. She was found to have acute sigmoid diverticulitis with intramural abscess along the mid-sigmoid colon. A LLQ 12 Fr anterior pelvis abscess drain was initially placed on 06/21/21.   Patient has been seen in IR drain clinic on 07/10/21, 08/11/21, 09/09/21, 10/09/21, 8/28, and had her drain exchanged by Dr Pascal Lux on 10/21/21.   Interval: Tracy Kincannon reports today that the output remains low, and she know has an upcoming colonoscopy and possible surgery with Dr. Dema Severin in 4-6 weeks.    Today's injection confirms that there is persisting fistula.  She denies any new fevers.   Past Medical History:  Diagnosis Date   Achilles bursitis or tendinitis    Anxiety state, unspecified    Arthritis    Closed fracture of unspecified part of fibula    Depressive disorder, not elsewhere classified    Diverticulosis of colon (without mention of hemorrhage)    Edema    Fibromyalgia    Insomnia, unspecified    Lumbago    Mass of left ovary 06/01/2016   Meniere's disease 1986   MVP (mitral valve prolapse)    Other intra-abdominal and pelvic swelling, mass and lump 06/01/2016   Pain in joint, site unspecified    Peptic ulcer    no testing   Pure hypercholesterolemia    TSH elevation     Past Surgical History:  Procedure Laterality Date   ABDOMINAL HYSTERECTOMY  03/08/1980   fibroids; ovaries intact.   BACK SURGERY     x2 Cervical and lumbar   COLONOSCOPY  2017   CYSTOSCOPY N/A  06/08/2016   Procedure: CYSTOSCOPY;  Surgeon: Will Bonnet, MD;  Location: [facility];  Service: Gynecology;  Laterality: N/A;   IR CATHETER TUBE CHANGE  10/21/2021   IR RADIOLOGIST EVAL & MGMT  07/10/2021   IR RADIOLOGIST EVAL & MGMT  08/11/2021   IR RADIOLOGIST EVAL & MGMT  09/09/2021   IR RADIOLOGIST EVAL & MGMT  10/09/2021   IR RADIOLOGIST EVAL & MGMT  11/02/2021   LAPAROSCOPIC LYSIS OF ADHESIONS  06/08/2016   Procedure: LAPAROSCOPIC LYSIS OF ADHESIONS;  Surgeon: Will Bonnet, MD;  Location: [facility];  Service: Gynecology;;   LAPAROSCOPIC SALPINGO OOPHERECTOMY Left 06/08/2016   Procedure: LAPAROSCOPIC SALPINGO OOPHORECTOMY;  Surgeon: Will Bonnet, MD;  Location: [facility];  Service: Gynecology;  Laterality: Left;   OOPHORECTOMY     left ovary   SALPINGECTOMY     left   SPINE SURGERY     lumbar spine 11/2015; cervical spine 2009. [facility].    Allergies: Demerol [meperidine], Kenalog [triamcinolone acetonide], and Adhesive [tape]  Medications: Prior to Admission medications   Medication Sig Start Date End Date Taking? Authorizing Provider  acetaminophen (TYLENOL) 325 MG tablet Take 650 mg by mouth every 6 (six) hours as needed for moderate pain or headache.    [provider]  buPROPion (WELLBUTRIN XL) 150 MG 24 hr tablet Take 2 tablets (300 mg total) by mouth  daily. For depression. 03/24/21   [name], NP  busPIRone (BUSPAR) 5 MG tablet TAKE 1 TABLET (5 MG TOTAL) BY MOUTH 2 (TWO) TIMES DAILY. FOR ANXIETY 11/06/21   [name], NP  Cholecalciferol (VITAMIN D3 PO) Take 1 tablet by mouth daily. Unsure of mg    [provider]  fluticasone (FLONASE) 50 MCG/ACT nasal spray Place 2 sprays into the nose daily as needed for allergies.     [provider]  Multiple Vitamins-Minerals (MULTIVITAMIN WITH MINERALS) tablet Take 1 tablet by mouth daily. Centrum Silver for Women    [provider]  Omega-3 Fatty Acids (FISH OIL) 500 MG  CAPS Take 500 mg by mouth daily.    [provider]  POTASSIUM PO Take 99 mg by mouth daily.    [provider]  pravastatin (PRAVACHOL) 40 MG tablet TAKE 1 TABLET BY MOUTH EVERY DAY FOR CHOLESTEROL Patient taking differently: Take 40 mg by mouth at bedtime. 05/17/21   [name], NP  vitamin B-12 (CYANOCOBALAMIN) 1000 MCG tablet Take 1,000 mcg by mouth daily.    [provider]     Family History  Problem Relation Age of Onset   Hyperlipidemia Mother    Hypertension Mother    Colon polyps Mother    Coronary artery disease Father         AMI age 64   Heart disease Father 38       AMI x 2; first AMI in 30s   Breast cancer Sister 73       with metastasis to bone; smoker   Cancer Sister 37       breast cancer   Colon cancer Maternal Uncle        dx. >50   Colon cancer Paternal Aunt        dx. 83s   Heart attack Paternal Uncle    Stroke Maternal Grandmother    Alzheimer's disease Paternal Grandmother    Heart disease Paternal Grandfather    Heart attack Paternal Grandfather    Healthy Daughter    Healthy Daughter    Breast cancer Cousin    Ovarian cancer Cousin        dx. late 70s/dx. 50s   Lung cancer Cousin        dx. 70s   Stomach cancer Other    Esophageal cancer Neg Hx    Rectal cancer Neg Hx     Social History   Socioeconomic History   Marital status: Married    Spouse name: Darrell Leonhardt   Number of children: 2   Years of education: Not on file   Highest education level: Not on file  Occupational History   Occupation: retired    Comment: Network engineer  Tobacco Use   Smoking status: Never    Passive exposure: Never   Smokeless tobacco: Never  Vaping Use   Vaping Use: Never used  Substance and Sexual Activity   Alcohol use: Yes    Comment: 2-3 beers per week or wine   Drug use: No   Sexual activity: Yes    Birth control/protection: Surgical  Other Topics Concern   Not on file  Social History Narrative   Marital status:   Married x 14 years; second marriage;happily married,husband has PTSD.     Children: 2 daughters [name], [name]); 7 grandchildren; 2 gg      Lives: with husband, dog/beagle      Employment: retired since 2013; Network engineer  Tobacco: never      Alcohol: weekends; 1-2 glasses of wine per week or beer.      Drugs:  None      Exercise: none in 2018      Seatbelt: 100%      Guns: unloaded.     Caffeine not every day. 7 grandchildren and 2 GG. Organ donor NO.      ADLs: independent with ADLs; drives      Advanced Directives: YES; FULL CODE no prolonged measures.   Social Determinants of Health   Financial Resource Strain: Low Risk  (09/29/2021)   Overall Financial Resource Strain (CARDIA)    Difficulty of Paying Living Expenses: Not hard at all  Food Insecurity: No Food Insecurity (09/29/2021)   Hunger Vital Sign    Worried About Running Out of Food in the Last Year: Never true    Ran Out of Food in the Last Year: Never true  Transportation Needs: No Transportation Needs (09/29/2021)   PRAPARE - Hydrologist (Medical): No    Lack of Transportation (Non-Medical): No  Physical Activity: Insufficiently Active (09/29/2021)   Exercise Vital Sign    Days of Exercise per Week: 2 days    Minutes of Exercise per Session: 20 min  Stress: No Stress Concern Present (09/29/2021)   [place]    Feeling of Stress : Only a little  Social Connections: Moderately Integrated (09/29/2021)   Social Connection and Isolation Panel [NHANES]    Frequency of Communication with Friends and Family: More than three times a week    Frequency of Social Gatherings with Friends and Family: Once a week    Attends Religious Services: More than 4 times per year    Active Member of [employer] or Organizations: No    Attends Archivist Meetings: Never    Marital Status: Married      Review of Systems: A 12 point ROS discussed  and pertinent positives are indicated in the HPI above.  All other systems are negative.  Review of Systems  Vital Signs: There were no vitals taken for this visit.   Physical Exam  Mallampati Score:     Imaging: DG Knee Complete 4 Views Left  Result Date: 11/02/2021 CLINICAL DATA:  Pain left knee EXAM: LEFT KNEE - COMPLETE 4+ VIEW COMPARISON:  09/09/2020 FINDINGS: No fracture or dislocation is seen. No significant bony spurs are seen. There is no effusion in suprapatellar bursa. Small transverse linear densities in distal femur and proximal tibia may suggest growth arrest lines from previous illnesses. IMPRESSION: No significant acute radiographic abnormality is seen in left knee. Electronically Signed   By: Elmer Picker M.D.   On: 11/02/2021 20:52   DG Sinus/Fist Tube Chk-Non GI  Result Date: 11/02/2021 CLINICAL DATA:  Diverticular abscess, persistent fistula, surgical follow up 11/03/21. EXAM: ABSCESS INJECTION COMPARISON:  Injection 10/09/21, exchange 10/21/21. CONTRAST:  7cc Omnipaque - administered via the existing percutaneous drain. FLUOROSCOPY TIME:  3.535 mGy TECHNIQUE: The patient was positioned supine on the fluoroscopy table. A preprocedural spot fluoroscopic image was obtained of the pelvis and the existing percutaneous drainage catheter. Multiple spot fluoroscopic and radiographic images were obtained following the injection of a small amount of contrast via the existing percutaneous drainage catheter. FINDINGS: Contrast readily fills the drain space and back tracks along the outside of the catheter before trace contrast is identified spilling into the colon. IMPRESSION: Smaller, though persistent colonic  fistula remains. Performed by Reatha Armour, PA and read by Pasty Spillers, PA Electronically Signed   By: Ruthann Cancer M.D.   On: 11/02/2021 12:19   IR Radiologist Eval & Mgmt  Result Date: 11/02/2021 Please refer to notes tab for details about interventional procedure.  (Op Note)   Labs:  CBC: Recent Labs    06/23/21 0100 06/24/21 0210 06/25/21 0130 06/26/21 0259  WBC 4.2 2.9* 2.8* 3.4*  HGB 9.7* 10.5* 10.7* 10.6*  HCT 28.5* 31.7* 31.9* 32.3*  PLT 286 287 335 382    COAGS: Recent Labs    06/20/21 1417  INR 1.3*    BMP: Recent Labs    06/23/21 0100 06/24/21 0210 06/25/21 0130 06/26/21 0259  NA 139 141 140 135  K 3.9 3.2* 2.9* 4.1  CL 106 107 106 106  CO2 '23 26 27 24  '$ GLUCOSE 79 103* 94 114*  BUN 8 <5* <5* <5*  CALCIUM 7.9* 8.0* 8.4* 8.4*  CREATININE 0.55 0.47 0.48 0.52  GFRNONAA >60 >60 >60 >60    LIVER FUNCTION TESTS: Recent Labs    03/24/21 1159 06/20/21 1417 06/21/21 0500  BILITOT 0.4 0.8 0.8  AST 17 15 12*  ALT '15 11 9  '$ ALKPHOS 46 50 43  PROT 7.4 7.3 6.0*  ALBUMIN 4.5 3.3* 2.6*    TUMOR MARKERS: No results for input(s): "AFPTM", "CEA", "CA199", "CHROMGRNA" in the last 8760 hours.  Assessment and Plan:  Tracy Pilling is 71 yo female with prior diverticular abscess, with indwelling drain and fistula.   Fistula persists on today's injection.   She tells me today that she has colonoscopy set up and tentative surgery with Dr. Dema Severin in 4-6 weeks.   I advised that she not do anything different with the drain care at this point, and that we can keep it in place for now.  We changed the bag, and I encouraged her to keep her upcoming appointments.   We will schedule her for another injection in 8 weeks from now, just so that if something happens with her plan she will not be lost to follow up.    Electronically Signed: Corrie Mckusick 11/24/2021, 11:16 AM   I spent a total of    15 Minutes in face to face in clinical consultation, greater than 50% of which was counseling/coordinating care for diverticular abscess drain and fistula follow up

## 2021-11-25 ENCOUNTER — Other Ambulatory Visit: Payer: Self-pay | Admitting: Surgery

## 2021-11-25 DIAGNOSIS — K651 Peritoneal abscess: Secondary | ICD-10-CM

## 2021-12-10 ENCOUNTER — Telehealth: Payer: Self-pay | Admitting: Primary Care

## 2021-12-10 NOTE — Telephone Encounter (Signed)
Patient called in stating she has been sick for 2 weeks with a fever, chills, sore throat. She doesn't want to schedule with anyone but Belenda Cruise. She was wondering if she could fit her in somewhere today or tomorrow for a virtual appointment. Please advise. Thank you!

## 2021-12-10 NOTE — Telephone Encounter (Signed)
I am just now seeing this message.  I can see her tomorrow (12/11/21) at 10 am, but I recommend she come in person. Also check to see if she's tested for Covid,.

## 2021-12-11 ENCOUNTER — Other Ambulatory Visit: Payer: Self-pay

## 2021-12-11 ENCOUNTER — Encounter (HOSPITAL_COMMUNITY): Payer: Self-pay | Admitting: Emergency Medicine

## 2021-12-11 ENCOUNTER — Inpatient Hospital Stay (HOSPITAL_COMMUNITY)
Admission: EM | Admit: 2021-12-11 | Discharge: 2021-12-29 | DRG: 330 | Disposition: A | Discharge status: Home Health | Payer: Medicare Other | Attending: Internal Medicine | Admitting: Internal Medicine

## 2021-12-11 DIAGNOSIS — R634 Abnormal weight loss: Secondary | ICD-10-CM | POA: Diagnosis present

## 2021-12-11 DIAGNOSIS — F419 Anxiety disorder, unspecified: Secondary | ICD-10-CM | POA: Diagnosis not present

## 2021-12-11 DIAGNOSIS — F32A Depression, unspecified: Secondary | ICD-10-CM | POA: Diagnosis not present

## 2021-12-11 DIAGNOSIS — N3000 Acute cystitis without hematuria: Secondary | ICD-10-CM | POA: Diagnosis not present

## 2021-12-11 DIAGNOSIS — Z823 Family history of stroke: Secondary | ICD-10-CM

## 2021-12-11 DIAGNOSIS — R531 Weakness: Principal | ICD-10-CM

## 2021-12-11 DIAGNOSIS — Z888 Allergy status to other drugs, medicaments and biological substances status: Secondary | ICD-10-CM | POA: Diagnosis not present

## 2021-12-11 DIAGNOSIS — N1330 Unspecified hydronephrosis: Secondary | ICD-10-CM | POA: Diagnosis not present

## 2021-12-11 DIAGNOSIS — K5792 Diverticulitis of intestine, part unspecified, without perforation or abscess without bleeding: Secondary | ICD-10-CM

## 2021-12-11 DIAGNOSIS — Z800 Family history of malignant neoplasm of digestive organs: Secondary | ICD-10-CM

## 2021-12-11 DIAGNOSIS — T402X5A Adverse effect of other opioids, initial encounter: Secondary | ICD-10-CM | POA: Diagnosis not present

## 2021-12-11 DIAGNOSIS — D62 Acute posthemorrhagic anemia: Secondary | ICD-10-CM | POA: Diagnosis not present

## 2021-12-11 DIAGNOSIS — Z79899 Other long term (current) drug therapy: Secondary | ICD-10-CM

## 2021-12-11 DIAGNOSIS — Z803 Family history of malignant neoplasm of breast: Secondary | ICD-10-CM | POA: Diagnosis not present

## 2021-12-11 DIAGNOSIS — K5732 Diverticulitis of large intestine without perforation or abscess without bleeding: Secondary | ICD-10-CM | POA: Diagnosis not present

## 2021-12-11 DIAGNOSIS — T428X5A Adverse effect of antiparkinsonism drugs and other central muscle-tone depressants, initial encounter: Secondary | ICD-10-CM | POA: Diagnosis not present

## 2021-12-11 DIAGNOSIS — M810 Age-related osteoporosis without current pathological fracture: Secondary | ICD-10-CM | POA: Diagnosis present

## 2021-12-11 DIAGNOSIS — Z801 Family history of malignant neoplasm of trachea, bronchus and lung: Secondary | ICD-10-CM

## 2021-12-11 DIAGNOSIS — F062 Psychotic disorder with delusions due to known physiological condition: Secondary | ICD-10-CM | POA: Diagnosis not present

## 2021-12-11 DIAGNOSIS — E785 Hyperlipidemia, unspecified: Secondary | ICD-10-CM | POA: Diagnosis present

## 2021-12-11 DIAGNOSIS — I341 Nonrheumatic mitral (valve) prolapse: Secondary | ICD-10-CM | POA: Diagnosis present

## 2021-12-11 DIAGNOSIS — K632 Fistula of intestine: Secondary | ICD-10-CM | POA: Diagnosis present

## 2021-12-11 DIAGNOSIS — T82898A Other specified complication of vascular prosthetic devices, implants and grafts, initial encounter: Secondary | ICD-10-CM | POA: Diagnosis not present

## 2021-12-11 DIAGNOSIS — N136 Pyonephrosis: Secondary | ICD-10-CM | POA: Diagnosis present

## 2021-12-11 DIAGNOSIS — E440 Moderate protein-calorie malnutrition: Secondary | ICD-10-CM | POA: Diagnosis present

## 2021-12-11 DIAGNOSIS — Z885 Allergy status to narcotic agent status: Secondary | ICD-10-CM

## 2021-12-11 DIAGNOSIS — Z6821 Body mass index (BMI) 21.0-21.9, adult: Secondary | ICD-10-CM | POA: Diagnosis not present

## 2021-12-11 DIAGNOSIS — E7800 Pure hypercholesterolemia, unspecified: Secondary | ICD-10-CM | POA: Diagnosis present

## 2021-12-11 DIAGNOSIS — B961 Klebsiella pneumoniae [K. pneumoniae] as the cause of diseases classified elsewhere: Secondary | ICD-10-CM | POA: Diagnosis present

## 2021-12-11 DIAGNOSIS — D649 Anemia, unspecified: Secondary | ICD-10-CM | POA: Diagnosis not present

## 2021-12-11 DIAGNOSIS — Z4682 Encounter for fitting and adjustment of non-vascular catheter: Secondary | ICD-10-CM | POA: Diagnosis not present

## 2021-12-11 DIAGNOSIS — N390 Urinary tract infection, site not specified: Secondary | ICD-10-CM | POA: Diagnosis not present

## 2021-12-11 DIAGNOSIS — M797 Fibromyalgia: Secondary | ICD-10-CM | POA: Diagnosis not present

## 2021-12-11 DIAGNOSIS — Z820 Family history of epilepsy and other diseases of the nervous system: Secondary | ICD-10-CM

## 2021-12-11 DIAGNOSIS — Z8041 Family history of malignant neoplasm of ovary: Secondary | ICD-10-CM

## 2021-12-11 DIAGNOSIS — M1990 Unspecified osteoarthritis, unspecified site: Secondary | ICD-10-CM | POA: Diagnosis not present

## 2021-12-11 DIAGNOSIS — R109 Unspecified abdominal pain: Secondary | ICD-10-CM | POA: Diagnosis not present

## 2021-12-11 DIAGNOSIS — Z933 Colostomy status: Secondary | ICD-10-CM | POA: Diagnosis present

## 2021-12-11 DIAGNOSIS — K5720 Diverticulitis of large intestine with perforation and abscess without bleeding: Secondary | ICD-10-CM | POA: Diagnosis not present

## 2021-12-11 DIAGNOSIS — K567 Ileus, unspecified: Secondary | ICD-10-CM | POA: Diagnosis not present

## 2021-12-11 DIAGNOSIS — R739 Hyperglycemia, unspecified: Secondary | ICD-10-CM | POA: Diagnosis not present

## 2021-12-11 DIAGNOSIS — K5730 Diverticulosis of large intestine without perforation or abscess without bleeding: Secondary | ICD-10-CM | POA: Diagnosis not present

## 2021-12-11 DIAGNOSIS — K631 Perforation of intestine (nontraumatic): Secondary | ICD-10-CM | POA: Diagnosis not present

## 2021-12-11 DIAGNOSIS — Z83438 Family history of other disorder of lipoprotein metabolism and other lipidemia: Secondary | ICD-10-CM

## 2021-12-11 DIAGNOSIS — Z8249 Family history of ischemic heart disease and other diseases of the circulatory system: Secondary | ICD-10-CM

## 2021-12-11 DIAGNOSIS — Z83719 Family history of colon polyps, unspecified: Secondary | ICD-10-CM

## 2021-12-11 LAB — CBC WITH DIFFERENTIAL/PLATELET
Abs Immature Granulocytes: 0.03 10*3/uL (ref 0.00–0.07)
Basophils Absolute: 0 10*3/uL (ref 0.0–0.1)
Basophils Relative: 0 %
Eosinophils Absolute: 0 10*3/uL (ref 0.0–0.5)
Eosinophils Relative: 1 %
HCT: 38.4 % (ref 36.0–46.0)
Hemoglobin: 12.9 g/dL (ref 12.0–15.0)
Immature Granulocytes: 0 %
Lymphocytes Relative: 26 %
Lymphs Abs: 1.8 10*3/uL (ref 0.7–4.0)
MCH: 33.3 pg (ref 26.0–34.0)
MCHC: 33.6 g/dL (ref 30.0–36.0)
MCV: 99.2 fL (ref 80.0–100.0)
Monocytes Absolute: 0.7 10*3/uL (ref 0.1–1.0)
Monocytes Relative: 10 %
Neutro Abs: 4.2 10*3/uL (ref 1.7–7.7)
Neutrophils Relative %: 63 %
Platelets: 316 10*3/uL (ref 150–400)
RBC: 3.87 MIL/uL (ref 3.87–5.11)
RDW: 11.5 % (ref 11.5–15.5)
WBC: 6.8 10*3/uL (ref 4.0–10.5)
nRBC: 0 % (ref 0.0–0.2)

## 2021-12-11 LAB — COMPREHENSIVE METABOLIC PANEL
ALT: 16 U/L (ref 0–44)
AST: 22 U/L (ref 15–41)
Albumin: 3.6 g/dL (ref 3.5–5.0)
Alkaline Phosphatase: 54 U/L (ref 38–126)
Anion gap: 9 (ref 5–15)
BUN: 23 mg/dL (ref 8–23)
CO2: 28 mmol/L (ref 22–32)
Calcium: 10.2 mg/dL (ref 8.9–10.3)
Chloride: 100 mmol/L (ref 98–111)
Creatinine, Ser: 0.66 mg/dL (ref 0.44–1.00)
GFR, Estimated: >60 mL/min (ref >60)
Glucose, Bld: 113 mg/dL — ABNORMAL HIGH (ref 70–99)
Potassium: 4.3 mmol/L (ref 3.5–5.1)
Sodium: 137 mmol/L (ref 135–145)
Total Bilirubin: 0.2 mg/dL — ABNORMAL LOW (ref 0.3–1.2)
Total Protein: 6.8 g/dL (ref 6.5–8.1)

## 2021-12-11 LAB — I-STAT CHEM 8, ED
BUN: 26 mg/dL — ABNORMAL HIGH (ref 8–23)
Calcium, Ion: 1.21 mmol/L (ref 1.15–1.40)
Chloride: 99 mmol/L (ref 98–111)
Creatinine, Ser: 0.6 mg/dL (ref 0.44–1.00)
Glucose, Bld: 112 mg/dL — ABNORMAL HIGH (ref 70–99)
HCT: 37 % (ref 36.0–46.0)
Hemoglobin: 12.6 g/dL (ref 12.0–15.0)
Potassium: 4.1 mmol/L (ref 3.5–5.1)
Sodium: 138 mmol/L (ref 135–145)
TCO2: 29 mmol/L (ref 22–32)

## 2021-12-11 LAB — URINALYSIS, ROUTINE W REFLEX MICROSCOPIC
Bilirubin Urine: NEGATIVE
Glucose, UA: NEGATIVE mg/dL
Hgb urine dipstick: NEGATIVE
Ketones, ur: NEGATIVE mg/dL
Nitrite: POSITIVE — AB
Protein, ur: NEGATIVE mg/dL
Specific Gravity, Urine: 1.02 (ref 1.005–1.030)
pH: 5 (ref 5.0–8.0)

## 2021-12-11 LAB — LIPASE, BLOOD: Lipase: 23 U/L (ref 11–51)

## 2021-12-11 MED ORDER — SODIUM CHLORIDE 0.9 % IV BOLUS
1000.0000 mL | Freq: Once | INTRAVENOUS | Status: AC
  Administered 2021-12-12: 1000 mL via INTRAVENOUS

## 2021-12-11 MED ORDER — SODIUM CHLORIDE 0.9 % IV SOLN: INTRAVENOUS | Status: DC

## 2021-12-11 NOTE — ED Provider Triage Note (Signed)
Emergency Medicine Provider Triage Evaluation Note  Tracy Estrada , a 71 y.o. female  was evaluated in triage.  Pt complains of weight loss and decreasing appetite.  Patient has history of complicated diverticulitis with perforation and abscess formation.  Patient is followed by Dr. Dema Severin from [facility] surgery.  Patient is also being treated by [facility] radiology, interventional radiology.  Patient has a indwelling catheter draining the abscess.  She continues to have that in place.  Patient states over the last couple weeks she has had decreasing appetite.  She has had weight loss.  She is only had a couple episodes of vomiting but mostly just does not have any appetite and does not want to eat.  She denies having severe abdominal pain  Review of Systems  Positive: Anorexia Negative: No fevers  Physical Exam  BP 120/80   Pulse (!) 107   Temp 98.3 F (36.8 C) (Oral)   Resp 14   Ht 1.524 m (5')   Wt 49.9 kg   SpO2 97%   BMI 21.48 kg/m  Gen:   Awake, no distress   Resp:  Normal effort  MSK:   Moves extremities without difficulty  Other:  Abdomen mildly tender to palpation, drains in place, of feculent mucopurulent material in the range bag  Medical Decision Making  Medically screening exam initiated at 4:25 PM.  Appropriate orders placed.  Tracy Estrada was informed that the remainder of the evaluation will be completed by another provider, this initial triage assessment does not replace that evaluation, and the importance of remaining in the ED until their evaluation is complete.  We will proceed with laboratory test.  Anticipate CT scan   Tracy Rank, MD 12/11/21 [phone]

## 2021-12-11 NOTE — ED Triage Notes (Signed)
Patient arrives with family by POV c/o abdominal issues. States patient is supposed to have a colonoscopy next week. Patient has known hole in her colon and has colostomy. Over the past two weeks patient has been losing weight and lack of appetite. C/o abdominal pain since the surgeon had to redirect her colostomy about a month or two ago.

## 2021-12-11 NOTE — Telephone Encounter (Signed)
Called and scheduled an appointment 10/10

## 2021-12-12 ENCOUNTER — Emergency Department (HOSPITAL_COMMUNITY): Payer: Medicare Other

## 2021-12-12 DIAGNOSIS — F419 Anxiety disorder, unspecified: Secondary | ICD-10-CM | POA: Diagnosis present

## 2021-12-12 DIAGNOSIS — K5732 Diverticulitis of large intestine without perforation or abscess without bleeding: Secondary | ICD-10-CM | POA: Diagnosis not present

## 2021-12-12 DIAGNOSIS — Z888 Allergy status to other drugs, medicaments and biological substances status: Secondary | ICD-10-CM | POA: Diagnosis not present

## 2021-12-12 DIAGNOSIS — F32A Depression, unspecified: Secondary | ICD-10-CM

## 2021-12-12 DIAGNOSIS — Z8041 Family history of malignant neoplasm of ovary: Secondary | ICD-10-CM | POA: Diagnosis not present

## 2021-12-12 DIAGNOSIS — Z885 Allergy status to narcotic agent status: Secondary | ICD-10-CM | POA: Diagnosis not present

## 2021-12-12 DIAGNOSIS — N3000 Acute cystitis without hematuria: Secondary | ICD-10-CM

## 2021-12-12 DIAGNOSIS — Z800 Family history of malignant neoplasm of digestive organs: Secondary | ICD-10-CM | POA: Diagnosis not present

## 2021-12-12 DIAGNOSIS — N390 Urinary tract infection, site not specified: Secondary | ICD-10-CM

## 2021-12-12 DIAGNOSIS — N1330 Unspecified hydronephrosis: Secondary | ICD-10-CM | POA: Diagnosis present

## 2021-12-12 DIAGNOSIS — D62 Acute posthemorrhagic anemia: Secondary | ICD-10-CM | POA: Diagnosis not present

## 2021-12-12 DIAGNOSIS — R739 Hyperglycemia, unspecified: Secondary | ICD-10-CM | POA: Diagnosis not present

## 2021-12-12 DIAGNOSIS — K5720 Diverticulitis of large intestine with perforation and abscess without bleeding: Secondary | ICD-10-CM | POA: Diagnosis present

## 2021-12-12 DIAGNOSIS — Z803 Family history of malignant neoplasm of breast: Secondary | ICD-10-CM | POA: Diagnosis not present

## 2021-12-12 DIAGNOSIS — Z6821 Body mass index (BMI) 21.0-21.9, adult: Secondary | ICD-10-CM | POA: Diagnosis not present

## 2021-12-12 DIAGNOSIS — K631 Perforation of intestine (nontraumatic): Secondary | ICD-10-CM | POA: Diagnosis not present

## 2021-12-12 DIAGNOSIS — K567 Ileus, unspecified: Secondary | ICD-10-CM | POA: Diagnosis not present

## 2021-12-12 DIAGNOSIS — M1990 Unspecified osteoarthritis, unspecified site: Secondary | ICD-10-CM | POA: Diagnosis present

## 2021-12-12 DIAGNOSIS — T402X5A Adverse effect of other opioids, initial encounter: Secondary | ICD-10-CM | POA: Diagnosis not present

## 2021-12-12 DIAGNOSIS — F062 Psychotic disorder with delusions due to known physiological condition: Secondary | ICD-10-CM | POA: Diagnosis not present

## 2021-12-12 DIAGNOSIS — K5730 Diverticulosis of large intestine without perforation or abscess without bleeding: Secondary | ICD-10-CM | POA: Diagnosis not present

## 2021-12-12 DIAGNOSIS — K632 Fistula of intestine: Secondary | ICD-10-CM | POA: Diagnosis present

## 2021-12-12 DIAGNOSIS — E785 Hyperlipidemia, unspecified: Secondary | ICD-10-CM

## 2021-12-12 DIAGNOSIS — E7800 Pure hypercholesterolemia, unspecified: Secondary | ICD-10-CM | POA: Diagnosis present

## 2021-12-12 DIAGNOSIS — E440 Moderate protein-calorie malnutrition: Secondary | ICD-10-CM | POA: Diagnosis present

## 2021-12-12 DIAGNOSIS — R109 Unspecified abdominal pain: Secondary | ICD-10-CM | POA: Diagnosis not present

## 2021-12-12 DIAGNOSIS — Z79899 Other long term (current) drug therapy: Secondary | ICD-10-CM | POA: Diagnosis not present

## 2021-12-12 DIAGNOSIS — M810 Age-related osteoporosis without current pathological fracture: Secondary | ICD-10-CM | POA: Diagnosis present

## 2021-12-12 DIAGNOSIS — R634 Abnormal weight loss: Secondary | ICD-10-CM | POA: Diagnosis present

## 2021-12-12 DIAGNOSIS — M797 Fibromyalgia: Secondary | ICD-10-CM | POA: Diagnosis present

## 2021-12-12 DIAGNOSIS — N136 Pyonephrosis: Secondary | ICD-10-CM | POA: Diagnosis present

## 2021-12-12 DIAGNOSIS — I341 Nonrheumatic mitral (valve) prolapse: Secondary | ICD-10-CM | POA: Diagnosis present

## 2021-12-12 DIAGNOSIS — T428X5A Adverse effect of antiparkinsonism drugs and other central muscle-tone depressants, initial encounter: Secondary | ICD-10-CM | POA: Diagnosis not present

## 2021-12-12 DIAGNOSIS — T82898A Other specified complication of vascular prosthetic devices, implants and grafts, initial encounter: Secondary | ICD-10-CM | POA: Diagnosis not present

## 2021-12-12 DIAGNOSIS — Z4682 Encounter for fitting and adjustment of non-vascular catheter: Secondary | ICD-10-CM | POA: Diagnosis not present

## 2021-12-12 DIAGNOSIS — D649 Anemia, unspecified: Secondary | ICD-10-CM | POA: Diagnosis not present

## 2021-12-12 HISTORY — DX: Urinary tract infection, site not specified: N39.0

## 2021-12-12 HISTORY — DX: Unspecified hydronephrosis: N13.30

## 2021-12-12 MED ORDER — SODIUM CHLORIDE 0.9 % IV SOLN: INTRAVENOUS | Status: AC

## 2021-12-12 MED ORDER — PANTOPRAZOLE SODIUM 40 MG IV SOLR
40.0000 mg | INTRAVENOUS | Status: DC
  Administered 2021-12-12: 40 mg via INTRAVENOUS
  Filled 2021-12-12: qty 10

## 2021-12-12 MED ORDER — MORPHINE SULFATE (PF) 2 MG/ML IV SOLN
2.0000 mg | INTRAVENOUS | Status: DC | PRN
  Filled 2021-12-12: qty 1

## 2021-12-12 MED ORDER — ORAL CARE MOUTH RINSE: 15.0000 mL | OROMUCOSAL | Status: DC | PRN

## 2021-12-12 MED ORDER — SODIUM CHLORIDE 0.9% FLUSH
3.0000 mL | Freq: Two times a day (BID) | INTRAVENOUS | Status: DC
  Administered 2021-12-12 – 2021-12-29 (×23): 3 mL via INTRAVENOUS

## 2021-12-12 MED ORDER — IOHEXOL 350 MG/ML SOLN
75.0000 mL | Freq: Once | INTRAVENOUS | Status: AC | PRN
  Administered 2021-12-12: 75 mL via INTRAVENOUS

## 2021-12-12 MED ORDER — ENOXAPARIN SODIUM 40 MG/0.4ML IJ SOSY
40.0000 mg | PREFILLED_SYRINGE | INTRAMUSCULAR | Status: DC
  Administered 2021-12-12 – 2021-12-21 (×7): 40 mg via SUBCUTANEOUS
  Filled 2021-12-12 (×10): qty 0.4

## 2021-12-12 MED ORDER — MAGIC MOUTHWASH: 15.0000 mL | Freq: Four times a day (QID) | ORAL | Status: DC | PRN

## 2021-12-12 MED ORDER — SODIUM CHLORIDE 0.9 % IV SOLN
8.0000 mg | Freq: Four times a day (QID) | INTRAVENOUS | Status: DC | PRN
  Administered 2021-12-24 – 2021-12-28 (×5): 8 mg via INTRAVENOUS
  Filled 2021-12-12 (×7): qty 4

## 2021-12-12 MED ORDER — PIPERACILLIN-TAZOBACTAM 3.375 G IVPB 30 MIN
3.3750 g | Freq: Once | INTRAVENOUS | Status: AC
  Administered 2021-12-12: 3.375 g via INTRAVENOUS
  Filled 2021-12-12: qty 50

## 2021-12-12 MED ORDER — HYDROCODONE-ACETAMINOPHEN 5-325 MG PO TABS
1.0000 | ORAL_TABLET | Freq: Four times a day (QID) | ORAL | Status: DC | PRN
  Administered 2021-12-14: 1 via ORAL
  Filled 2021-12-12 (×2): qty 1

## 2021-12-12 MED ORDER — BOOST / RESOURCE BREEZE PO LIQD CUSTOM: 1.0000 | Freq: Three times a day (TID) | ORAL | Status: DC

## 2021-12-12 MED ORDER — IOHEXOL 9 MG/ML PO SOLN
ORAL | Status: AC
  Filled 2021-12-12: qty 1000

## 2021-12-12 MED ORDER — ALBUTEROL SULFATE (2.5 MG/3ML) 0.083% IN NEBU: 2.5000 mg | INHALATION_SOLUTION | Freq: Four times a day (QID) | RESPIRATORY_TRACT | Status: DC | PRN

## 2021-12-12 MED ORDER — PIPERACILLIN-TAZOBACTAM 3.375 G IVPB
3.3750 g | Freq: Three times a day (TID) | INTRAVENOUS | Status: AC
  Administered 2021-12-12 – 2021-12-17 (×15): 3.375 g via INTRAVENOUS
  Filled 2021-12-12 (×15): qty 50

## 2021-12-12 MED ORDER — HYDRALAZINE HCL 20 MG/ML IJ SOLN: 10.0000 mg | INTRAMUSCULAR | Status: DC | PRN

## 2021-12-12 MED ORDER — SODIUM CHLORIDE 0.9 % IV SOLN
1.0000 g | Freq: Once | INTRAVENOUS | Status: AC
  Administered 2021-12-12: 1 g via INTRAVENOUS
  Filled 2021-12-12: qty 10

## 2021-12-12 MED ORDER — ONDANSETRON HCL 4 MG/2ML IJ SOLN
4.0000 mg | Freq: Four times a day (QID) | INTRAMUSCULAR | Status: DC | PRN
  Administered 2021-12-12 – 2021-12-21 (×9): 4 mg via INTRAVENOUS
  Filled 2021-12-12 (×9): qty 2

## 2021-12-12 MED ORDER — ACETAMINOPHEN 650 MG RE SUPP: 650.0000 mg | Freq: Four times a day (QID) | RECTAL | Status: DC | PRN

## 2021-12-12 MED ORDER — PROCHLORPERAZINE EDISYLATE 10 MG/2ML IJ SOLN
5.0000 mg | INTRAMUSCULAR | Status: DC | PRN
  Administered 2021-12-15: 5 mg via INTRAVENOUS
  Administered 2021-12-17: 10 mg via INTRAVENOUS
  Filled 2021-12-12 (×2): qty 2

## 2021-12-12 MED ORDER — LIP MEDEX EX OINT
TOPICAL_OINTMENT | Freq: Two times a day (BID) | CUTANEOUS | Status: DC
  Administered 2021-12-20: 1 via TOPICAL
  Administered 2021-12-21 – 2021-12-23 (×3): 75 via TOPICAL
  Administered 2021-12-24: 1 via TOPICAL
  Administered 2021-12-25 (×2): 75 via TOPICAL
  Administered 2021-12-29: 2 via TOPICAL
  Filled 2021-12-12 (×2): qty 7

## 2021-12-12 MED ORDER — ACETAMINOPHEN 325 MG PO TABS
650.0000 mg | ORAL_TABLET | Freq: Four times a day (QID) | ORAL | Status: DC | PRN
  Administered 2021-12-13: 650 mg via ORAL
  Filled 2021-12-12: qty 2

## 2021-12-12 NOTE — H&P (Signed)
History and Physical    Patient: Tracy Estrada OIZ:[id number] DOB: 1951-01-05 DOA: 12/11/2021 DOS: the patient was seen and examined on 12/12/2021 PCP: [name], NP  Patient coming from: Home  Chief Complaint:  Chief Complaint  Patient presents with   Anorexia   Weakness   HPI: Tracy Estrada is a 71 y.o. female with medical history significant of hyperlipidemia, recurrent UTIs, degenerative disc disease, fibromyalgia, anxiety, depression, and diverticulitis with abscess s/p cutaneous drain in place since 06/2021 who presents with complaints of weakness.  Symptoms started approximately 2 weeks ago with subjective fever and chills which she felt like she had the flu.  Noted associated symptoms of nausea, vomiting, and decreased appetite.  She has been drinking fluids which include boost drinks, but not really eating any significant solid foods.  She has been having routine follow-up with surgery and at the interventional radiology, but reports 25 to 30 cc every 2 days or so brownish to red material coming out with foul smell.  She had reported that her weight has been increasing, but over the last 2 weeks she has lost about 8 pounds.  Reports having pain around the drain site for which she has been taking Tylenol intermittently for the symptoms.  Upon admission to the emergency department patient was seen to be afebrile with tachypnea and all other vital signs relatively maintained.  Urinalysis noted leukocytes, positive nitrites, many bacteria, and 11-20 WBCs.  Patient had initially been given 1 g of Rocephin IV.  Labs noted elevated BUN to creatinine ratio to suggest dehydration.  CT scan of the abdomen pelvis noted complicated acute on chronic sigmoid diverticulitis with progression/June CT with adhesions and new mild bilateral hydronephrosis without stricture appreciated.  General surgery and gastroenterology have been consulted.  Patient had been given 1 L of normal saline IV fluids then  placed on a rate of 125, Magic mouthwash, and Zosyn.   Review of Systems: As mentioned in the history of present illness. All other systems reviewed and are negative. Past Medical History:  Diagnosis Date   Achilles bursitis or tendinitis    Anxiety state, unspecified    Arthritis    Closed fracture of unspecified part of fibula    Depressive disorder, not elsewhere classified    Diverticulosis of colon (without mention of hemorrhage)    Edema    Fibromyalgia    Insomnia, unspecified    Lumbago    Mass of left ovary 06/01/2016   Meniere's disease 1986   MVP (mitral valve prolapse)    Other intra-abdominal and pelvic swelling, mass and lump 06/01/2016   Pain in joint, site unspecified    Peptic ulcer    no testing   Pure hypercholesterolemia    TSH elevation    Past Surgical History:  Procedure Laterality Date   ABDOMINAL HYSTERECTOMY  03/08/1980   fibroids; ovaries intact.   BACK SURGERY     x2 Cervical and lumbar   COLONOSCOPY  2017   CYSTOSCOPY N/A 06/08/2016   Procedure: CYSTOSCOPY;  Surgeon: Will Bonnet, MD;  Location: [facility] OR[facility];  Service: Gynecology;  Laterality: N/A;   IR CATHETER TUBE CHANGE  10/21/2021   IR RADIOLOGIST EVAL & MGMT  07/10/2021   IR RADIOLOGIST EVAL & MGMT  08/11/2021   IR RADIOLOGIST EVAL & MGMT  09/09/2021   IR RADIOLOGIST EVAL & MGMT  10/09/2021   IR RADIOLOGIST EVAL & MGMT  11/02/2021   IR RADIOLOGIST EVAL & MGMT  11/24/2021   LAPAROSCOPIC LYSIS  OF ADHESIONS  06/08/2016   Procedure: LAPAROSCOPIC LYSIS OF ADHESIONS;  Surgeon: Will Bonnet, MD;  Location: [facility];  Service: Gynecology;;   LAPAROSCOPIC SALPINGO OOPHERECTOMY Left 06/08/2016   Procedure: LAPAROSCOPIC SALPINGO OOPHORECTOMY;  Surgeon: Will Bonnet, MD;  Location: [facility];  Service: Gynecology;  Laterality: Left;   OOPHORECTOMY     left ovary   SALPINGECTOMY     left   SPINE SURGERY     lumbar spine 11/2015; cervical spine 2009. [facility].   Social History:  reports  that she has never smoked. She has never been exposed to tobacco smoke. She has never used smokeless tobacco. She reports current alcohol use. She reports that she does not use drugs.  Allergies  Allergen Reactions   Demerol [Meperidine] Nausea And Vomiting   Kenalog [Triamcinolone Acetonide] Other (See Comments)    Cause vertigo/dizziness   Adhesive [Tape] Rash and Other (See Comments)    PAPER TAPE OK    Family History  Problem Relation Age of Onset   Hyperlipidemia Mother    Hypertension Mother    Colon polyps Mother    Coronary artery disease Father         AMI age 34   Heart disease Father 63       AMI x 2; first AMI in 41s   Breast cancer Sister 30       with metastasis to bone; smoker   Cancer Sister 45       breast cancer   Colon cancer Maternal Uncle        dx. >50   Colon cancer Paternal Aunt        dx. 46s   Heart attack Paternal Uncle    Stroke Maternal Grandmother    Alzheimer's disease Paternal Grandmother    Heart disease Paternal Grandfather    Heart attack Paternal Grandfather    Healthy Daughter    Healthy Daughter    Breast cancer Cousin    Ovarian cancer Cousin        dx. late 70s/dx. 13s   Lung cancer Cousin        dx. 70s   Stomach cancer Other    Esophageal cancer Neg Hx    Rectal cancer Neg Hx     Prior to Admission medications   Medication Sig Start Date End Date Taking? Authorizing Provider  acetaminophen (TYLENOL) 325 MG tablet Take 650 mg by mouth every 6 (six) hours as needed for moderate pain or headache.    [provider]  buPROPion (WELLBUTRIN XL) 150 MG 24 hr tablet Take 2 tablets (300 mg total) by mouth daily. For depression. 03/24/21   [name], NP  busPIRone (BUSPAR) 5 MG tablet TAKE 1 TABLET (5 MG TOTAL) BY MOUTH 2 (TWO) TIMES DAILY. FOR ANXIETY 11/06/21   [name], NP  Cholecalciferol (VITAMIN D3 PO) Take 1 tablet by mouth daily. Unsure of mg    [provider]  fluticasone (FLONASE) 50  MCG/ACT nasal spray Place 2 sprays into the nose daily as needed for allergies.     [provider]  Multiple Vitamins-Minerals (MULTIVITAMIN WITH MINERALS) tablet Take 1 tablet by mouth daily. Centrum Silver for Women    [provider]  Omega-3 Fatty Acids (FISH OIL) 500 MG CAPS Take 500 mg by mouth daily.    [provider]  POTASSIUM PO Take 99 mg by mouth daily.    [provider]  pravastatin (PRAVACHOL) 40 MG tablet TAKE 1  TABLET BY MOUTH EVERY DAY FOR CHOLESTEROL Patient taking differently: Take 40 mg by mouth at bedtime. 05/17/21   [name], NP  vitamin B-12 (CYANOCOBALAMIN) 1000 MCG tablet Take 1,000 mcg by mouth daily.    [provider]    Physical Exam: Vitals:   12/12/21 0800 12/12/21 0816 12/12/21 0845 12/12/21 1012  BP: 130/75  (!) 133/111 (!) 111/59  Pulse: 88  87 89  Resp: '16  16 18  '$ Temp:  97.8 F (36.6 C)  98.2 F (36.8 C)  TempSrc:  Oral  Oral  SpO2: 96%  93% 95%  Weight:      Height:       Exam  Constitutional: Elderly female who appears ill but in no acute distress Eyes: PERRL, lids and conjunctivae normal ENMT: Mucous membranes are dry. Neck: normal, supple  Respiratory: clear to auscultation bilaterally, no wheezing, no crackles. Normal respiratory effort. No accessory muscle use.  Cardiovascular: Regular rate and rhythm, no murmurs / rubs / gallops. No extremity edema.   Abdomen: Abdominal tenderness appreciated in the left lower quadrant of the abdomen.  Drain in place with feculent material with foul odor. Musculoskeletal: no clubbing / cyanosis. No joint deformity upper and lower extremities. Good ROM, no contractures. Normal muscle tone.  Skin: no rashes, lesions, ulcers. No induration Neurologic: CN 2-12 grossly intact. Sensation intact, DTR normal. Strength 5/5 in all 4.  Psychiatric: Normal judgment and insight. Alert and oriented x 3. Normal mood.   Data Reviewed:  Reviewed labs, imaging,  and pertinent records as noted above in the HPI  Assessment and Plan: Complicated sigmoid diverticulitis Acute on chronic.  Patient presents for 2-week history of subjective fever/chills, poor appetite, and continued lower abdominal pain.  Prior history of sigmoid diverticulitis with abscess requiring placement of percutaneous drain back in April.  She has been having continued drainage of 20 to 30 cc of brown fluid every 2 days with foul smell..  CT imaging concerning for complicated sigmoid diverticulitis without clear signs of fistula for imaging.  Started on empiric antibiotics of Zosyn. -Admit to a medical telemetry bed -Monitor intake and output -Full liquid diet per general surgery -Continue antibiotics of Zosyn -hydrocodone/Morphine as needed for moderate to severe pain respectively -Appreciate general surgery consultative services we will follow-up for any further recommendations   Urinary tract infection bilateral hydronephrosis Acute.  Urinalysis positive for trace leukocytes, positive nitrites, many bacteria, and 11-20 WBCs.  CT noted mild bilateral hydronephrosis without signs of stricture or fistula.  Patient had initially been given Rocephin IV. -Follow-up urine culture -Antibiotics as noted above  Anxiety and depression -Continue Wellbutrin and  Hyperlipidemia -Continue pravastatin  DVT prophylaxis: Lovenox Advance Care Planning:   Code Status: Full Code    Consults: General surgery  Family Communication: Family updated at bedside  Severity of Illness: The appropriate patient status for this patient is INPATIENT. Inpatient status is judged to be reasonable and necessary in order to provide the required intensity of service to ensure the patient's safety. The patient's presenting symptoms, physical exam findings, and initial radiographic and laboratory data in the context of their chronic comorbidities is felt to place them at high risk for further clinical  deterioration. Furthermore, it is not anticipated that the patient will be medically stable for discharge from the hospital within 2 midnights of admission.   * I certify that at the point of admission it is my clinical judgment that the patient will require inpatient hospital care spanning beyond  2 midnights from the point of admission due to high intensity of service, high risk for further deterioration and high frequency of surveillance required.*  Author: Norval Morton, MD 12/12/2021 10:21 AM  For on call review [email or web address].

## 2021-12-12 NOTE — ED Notes (Signed)
Received patient on cardiac monitor in no acute distress breathing spontaneously to room air. Patient is A/O x4. Patient has a PERC drain to the left upper abdomen. PERC drain is draining dark green liquid stool .Drain has [employer]. Patient denies nausea or vomiting . Patient assisted to the bathroom to urinate . Patient has a steady gait. Denies pain at this time . Continuing monitoring.

## 2021-12-12 NOTE — Consult Note (Signed)
Tracy Estrada 1950/07/28  [id number].    Requesting MD: Leonette Monarch, MD Chief Complaint/Reason for Consult: complicated diverticulitis  HPI:  Tracy Estrada is a 71 y/o F who was admitted to the hospital with perforated diverticulitis 06/20/2021 She was managed nonoperatively and eventually had a, IR drain placed. She got better and was discharged home. She underwent a repeat CT scan on 07/10/21 that showed just some trace residual simple appearing fluid but otherwise this is now normal. There was some persistent but improving sigmoid diverticulitis. There is no real concern that there is a mass there at this point. She was eating well and having bowel function. Drain study 9/19 showed continued connection to the sigmoid colon.   Today she tells me that over the last 2 weeks she has had fatigue, chills, decreased appetite. Over a week ago she had 2 episodes of vomiting. She is no longer vomiting and has been able to stay hydrated at home but is not hungry. Has been drinking boost. She is having flatus and some liquid stools. Her drain remains feculent. She denies urinary sxs.   She was following with Dr. Collene Mares for endoscopic surveillance of her colon. She has not had a repeat colonoscopy since her last in 2017 with Dr. Earlean Shawl. She was scheduled for colonoscopy with Dr. Tarri Glenn next week.   Her family members are at the bedside.   ROS: As above Review of Systems  All other systems reviewed and are negative.  Family History  Problem Relation Age of Onset   Hyperlipidemia Mother    Hypertension Mother    Colon polyps Mother    Coronary artery disease Father         AMI age 48   Heart disease Father 54       AMI x 2; first AMI in 71s   Breast cancer Sister 77       with metastasis to bone; smoker   Cancer Sister 42       breast cancer   Colon cancer Maternal Uncle        dx. >50   Colon cancer Paternal Aunt        dx. 8s   Heart attack Paternal Uncle    Stroke Maternal Grandmother     Alzheimer's disease Paternal Grandmother    Heart disease Paternal Grandfather    Heart attack Paternal Grandfather    Healthy Daughter    Healthy Daughter    Breast cancer Cousin    Ovarian cancer Cousin        dx. late 70s/dx. 52s   Lung cancer Cousin        dx. 70s   Stomach cancer Other    Esophageal cancer Neg Hx    Rectal cancer Neg Hx     Past Medical History:  Diagnosis Date   Achilles bursitis or tendinitis    Anxiety state, unspecified    Arthritis    Closed fracture of unspecified part of fibula    Depressive disorder, not elsewhere classified    Diverticulosis of colon (without mention of hemorrhage)    Edema    Fibromyalgia    Insomnia, unspecified    Lumbago    Mass of left ovary 06/01/2016   Meniere's disease 1986   MVP (mitral valve prolapse)    Other intra-abdominal and pelvic swelling, mass and lump 06/01/2016   Pain in joint, site unspecified    Peptic ulcer    no testing   Pure hypercholesterolemia  TSH elevation     Past Surgical History:  Procedure Laterality Date   ABDOMINAL HYSTERECTOMY  03/08/1980   fibroids; ovaries intact.   BACK SURGERY     x2 Cervical and lumbar   COLONOSCOPY  2017   CYSTOSCOPY N/A 06/08/2016   Procedure: CYSTOSCOPY;  Surgeon: Will Bonnet, MD;  Location: [facility];  Service: Gynecology;  Laterality: N/A;   IR CATHETER TUBE CHANGE  10/21/2021   IR RADIOLOGIST EVAL & MGMT  07/10/2021   IR RADIOLOGIST EVAL & MGMT  08/11/2021   IR RADIOLOGIST EVAL & MGMT  09/09/2021   IR RADIOLOGIST EVAL & MGMT  10/09/2021   IR RADIOLOGIST EVAL & MGMT  11/02/2021   IR RADIOLOGIST EVAL & MGMT  11/24/2021   LAPAROSCOPIC LYSIS OF ADHESIONS  06/08/2016   Procedure: LAPAROSCOPIC LYSIS OF ADHESIONS;  Surgeon: Will Bonnet, MD;  Location: [facility];  Service: Gynecology;;   LAPAROSCOPIC SALPINGO OOPHERECTOMY Left 06/08/2016   Procedure: LAPAROSCOPIC SALPINGO OOPHORECTOMY;  Surgeon: Will Bonnet, MD;  Location: [facility];   Service: Gynecology;  Laterality: Left;   OOPHORECTOMY     left ovary   SALPINGECTOMY     left   SPINE SURGERY     lumbar spine 11/2015; cervical spine 2009. [facility].    Social History:  reports that she has never smoked. She has never been exposed to tobacco smoke. She has never used smokeless tobacco. She reports current alcohol use. She reports that she does not use drugs.  Allergies:  Allergies  Allergen Reactions   Demerol [Meperidine] Nausea And Vomiting   Kenalog [Triamcinolone Acetonide] Other (See Comments)    Cause vertigo/dizziness   Adhesive [Tape] Rash and Other (See Comments)    PAPER TAPE OK    Medications Prior to Admission  Medication Sig Dispense Refill   acetaminophen (TYLENOL) 325 MG tablet Take 650 mg by mouth every 6 (six) hours as needed for moderate pain or headache.     buPROPion (WELLBUTRIN XL) 150 MG 24 hr tablet Take 2 tablets (300 mg total) by mouth daily. For depression. 180 tablet 3   busPIRone (BUSPAR) 5 MG tablet TAKE 1 TABLET (5 MG TOTAL) BY MOUTH 2 (TWO) TIMES DAILY. FOR ANXIETY 180 tablet 0   Cholecalciferol (VITAMIN D3 PO) Take 1 tablet by mouth daily. Unsure of mg     fluticasone (FLONASE) 50 MCG/ACT nasal spray Place 2 sprays into the nose daily as needed for allergies.      Multiple Vitamins-Minerals (MULTIVITAMIN WITH MINERALS) tablet Take 1 tablet by mouth daily. Centrum Silver for Women     Omega-3 Fatty Acids (FISH OIL) 500 MG CAPS Take 500 mg by mouth daily.     POTASSIUM PO Take 99 mg by mouth daily.     pravastatin (PRAVACHOL) 40 MG tablet TAKE 1 TABLET BY MOUTH EVERY DAY FOR CHOLESTEROL (Patient taking differently: Take 40 mg by mouth at bedtime.) 90 tablet 2   vitamin B-12 (CYANOCOBALAMIN) 1000 MCG tablet Take 1,000 mcg by mouth daily.       Physical Exam: Blood pressure 101/67, pulse 91, temperature 98.5 F (36.9 C), temperature source Oral, resp. rate 18, height 5' (1.524 m), weight 49.9 kg, SpO2 97 %. General: Pleasant white  female laying on hospital bed, appears stated age, NAD. HEENT: head -normocephalic, atraumatic; Eyes: PERRLA, no conjunctival injection, anicteric sclerae  Neck- Trachea is midline, no thyromegaly or JVD appreciated.  CV- RRR, normal S1/S2, no M/R/G, no lower extremity edema Pulm- breathing is non-labored. CTABL,  no wheezes, rhales, rhonchi. Abd- soft, NT/ND, LLQ IR drain with feculent effluent. GU- deferred  MSK- UE/LE symmetrical, no cyanosis, clubbing, or edema. Neuro- non-focal exam, gait no assessed Psych- Alert and Oriented x3 with appropriate affect Skin: warm and dry, no rashes or lesions   Results for orders placed or performed during the hospital encounter of 12/11/21 (from the past 48 hour(s))  Urinalysis, Routine w reflex microscopic Urine, Clean Catch     Status: Abnormal   Collection Time: 12/11/21  4:31 PM  Result Value Ref Range   Color, Urine YELLOW YELLOW   APPearance HAZY (A) CLEAR   Specific Gravity, Urine 1.020 1.005 - 1.030   pH 5.0 5.0 - 8.0   Glucose, UA NEGATIVE NEGATIVE mg/dL   Hgb urine dipstick NEGATIVE NEGATIVE   Bilirubin Urine NEGATIVE NEGATIVE   Ketones, ur NEGATIVE NEGATIVE mg/dL   Protein, ur NEGATIVE NEGATIVE mg/dL   Nitrite POSITIVE (A) NEGATIVE   Leukocytes,Ua TRACE (A) NEGATIVE   RBC / HPF 0-5 0 - 5 RBC/hpf   WBC, UA 11-20 0 - 5 WBC/hpf   Bacteria, UA MANY (A) NONE SEEN   Squamous Epithelial / LPF 0-5 0 - 5   Mucus PRESENT    Hyaline Casts, UA PRESENT    Ca Oxalate Crys, UA PRESENT     Comment: Performed at [facility], [address], [place], [place] [address]  Comprehensive metabolic panel     Status: Abnormal   Collection Time: 12/11/21  4:44 PM  Result Value Ref Range   Sodium 137 135 - 145 mmol/L   Potassium 4.3 3.5 - 5.1 mmol/L   Chloride 100 98 - 111 mmol/L   CO2 28 22 - 32 mmol/L   Glucose, Bld 113 (H) 70 - 99 mg/dL    Comment: Glucose reference range applies only to samples taken after fasting for at least 8 hours.    BUN 23 8 - 23 mg/dL   Creatinine, Ser 0.66 0.44 - 1.00 mg/dL   Calcium 10.2 8.9 - 10.3 mg/dL   Total Protein 6.8 6.5 - 8.1 g/dL   Albumin 3.6 3.5 - 5.0 g/dL   AST 22 15 - 41 U/L   ALT 16 0 - 44 U/L   Alkaline Phosphatase 54 38 - 126 U/L   Total Bilirubin 0.2 (L) 0.3 - 1.2 mg/dL   GFR, Estimated >60 >60 mL/min    Comment: (NOTE) Calculated using the CKD-EPI Creatinine Equation (2021)    Anion gap 9 5 - 15    Comment: Performed at [facility] [address], [place], [place] [address]  Lipase, blood     Status: None   Collection Time: 12/11/21  4:44 PM  Result Value Ref Range   Lipase 23 11 - 51 U/L    Comment: Performed at [facility] [address], [place], [place] [address]  CBC with Diff     Status: None   Collection Time: 12/11/21  4:44 PM  Result Value Ref Range   WBC 6.8 4.0 - 10.5 K/uL   RBC 3.87 3.87 - 5.11 MIL/uL   Hemoglobin 12.9 12.0 - 15.0 g/dL   HCT 38.4 36.0 - 46.0 %   MCV 99.2 80.0 - 100.0 fL   MCH 33.3 26.0 - 34.0 pg   MCHC 33.6 30.0 - 36.0 g/dL   RDW 11.5 11.5 - 15.5 %   Platelets 316 150 - 400 K/uL   nRBC 0.0 0.0 - 0.2 %  Neutrophils Relative % 63 %   Neutro Abs 4.2 1.7 - 7.7 K/uL   Lymphocytes Relative 26 %   Lymphs Abs 1.8 0.7 - 4.0 K/uL   Monocytes Relative 10 %   Monocytes Absolute 0.7 0.1 - 1.0 K/uL   Eosinophils Relative 1 %   Eosinophils Absolute 0.0 0.0 - 0.5 K/uL   Basophils Relative 0 %   Basophils Absolute 0.0 0.0 - 0.1 K/uL   Immature Granulocytes 0 %   Abs Immature Granulocytes 0.03 0.00 - 0.07 K/uL    Comment: Performed at [facility] [address], [place], [place] [address]  I-stat chem 8, ED (not at [facility] or [facility])     Status: Abnormal   Collection Time: 12/11/21  4:54 PM  Result Value Ref Range   Sodium 138 135 - 145 mmol/L   Potassium 4.1 3.5 - 5.1 mmol/L   Chloride 99 98 - 111 mmol/L   BUN 26 (H) 8 - 23 mg/dL   Creatinine, Ser 0.60 0.44 - 1.00 mg/dL   Glucose, Bld 112 (H) 70 - 99 mg/dL    Comment:  Glucose reference range applies only to samples taken after fasting for at least 8 hours.   Calcium, Ion 1.21 1.15 - 1.40 mmol/L   TCO2 29 22 - 32 mmol/L   Hemoglobin 12.6 12.0 - 15.0 g/dL   HCT 37.0 36.0 - 46.0 %   CT ABDOMEN PELVIS W CONTRAST  Result Date: 12/12/2021 CLINICAL DATA:  71 year old female with acute abdominal pain. History of perforated diverticulitis and chronic fistula, chronic pelvic drain. EXAM: CT ABDOMEN AND PELVIS WITH CONTRAST TECHNIQUE: Multidetector CT imaging of the abdomen and pelvis was performed using the standard protocol following bolus administration of intravenous contrast. RADIATION DOSE REDUCTION: This exam was performed according to the departmental dose-optimization program which includes automated exposure control, adjustment of the mA and/or kV according to patient size and/or use of iterative reconstruction technique. CONTRAST:  49m OMNIPAQUE IOHEXOL 350 MG/ML SOLN COMPARISON:  CT Abdomen and Pelvis 08/11/2021 and earlier. FINDINGS: Lower chest: Lower lung volumes with mild dependent atelectasis now at both lung bases. No pericardial or pleural effusion. Hepatobiliary: Negative liver and gallbladder. Pancreas: Stable and within normal limits. Spleen: Negative. Adrenals/Urinary Tract: Adrenal glands remain within normal limits. New bilateral renal pelvis and collecting system enlargement compatible with mild hydronephrosis. Symmetric renal enhancement and there is contrast excretion on delayed images. Symmetric contrast excretion to the ureters, which remain decompressed and traverse the bowel and mesenteric inflammation at the pelvic inlet to the bladder as seen on series 8, image 52. No significant ureteral distortion is evident. There is evidence of soft tissue thickening and adhesion to the abnormal sigmoid mesentery at the dome of the bladder on sagittal image 63. But otherwise only mild bladder wall thickening and no gas or oral contrast within the bladder to  suggest fistula. Stomach/Bowel: Oral contrast has reached the rectum without evidence of obstruction. Chronic percutaneous left lower quadrant pigtail drain terminates in the abnormal sigmoid mesentery, stable since June. Regional moderate sigmoid inflammation and wall thickening which appears mildly progressed compared to the June CT. The catheter is located at the proximal 3rd of the inflamed bowel segment. A small focus of fluid in the distal sigmoid mesentery has regressed since June with trace residual now visible on coronal image 44. Regional small bowel loops are nondilated and opacified with oral contrast, but multiple loops now appear likely adhesed to the abnormal sigmoid including along the cephalad margin  coronal image 23 and caudal margin image 27. No extraluminal gas or contrast identified. Upstream descending colon remarkable for diverticulosis but no active inflammation. Similar transverse and ascending colon diverticulosis with no active inflammation. Terminal ileum appears negative. Appendix not delineated. Stomach is mostly decompressed. There is a relatively large 4.5 cm diverticulum of the proximal duodenum, with no associated inflammation. No free air or free fluid. Vascular/Lymphatic: Major arterial structures in the abdomen and pelvis remain patent with mild for age Aortoiliac calcified atherosclerosis. Portal venous system is patent. No lymphadenopathy identified. Reproductive: Surgically absent uterus as before. Ovaries not well delineated. Other: No pelvic free fluid. Musculoskeletal: Stable visualized osseous structures. IMPRESSION: 1. Complicated Acute on chronic Sigmoid Diverticulitis with progression since a June CT in the form of increasing regional small bowel and bladder dome adhesions to the abnormal sigmoid colon. No associated bowel obstruction. No evidence of acute perforation. No evidence of colovesical fistula at this time. 2. Also there is new mild bilateral hydronephrosis,  but symmetric renal contrast excretion and opacified ureters are visible on series 8 tracking to the bladder without no significant ureteral stricture or distortion. 3. Chronic left lower quadrant pelvic drain appears stable. No residual/recurrent abscess. 4. Generalized underlying colon diverticulosis. Duodenal diverticulum. Electronically Signed   By: Genevie Ann M.D.   On: 12/12/2021 04:38      Assessment/Plan Acute on chronic sigmoid diverticulitis with perforation, colonic fistula controlled with IR drain - afebrile, VSS, WBC WNL - CT today with slight progression of inflammatory changes around the sigmoid colon compared to June. Decrease in size of fluid collection, controlled with drain. - No emergent surgical needs. Recommend IV abx, continue FLD + Boost or ensure.  - [facility] will follow closely. If she fails to improve this admission then she may end up needed exploratory laparotomy, partial colectomy, end colostomy.   FEN - FLD, ensure or boost TID VTE - SCD's ID - Zosyn; UA with UTI - no obvious signs of colovesical fistula on CT, UTI is asymptomatic Admit - [facility] service   I reviewed nursing notes, ED provider notes, hospitalist notes, last 24 h vitals and pain scores, last 48 h intake and output, last 24 h labs and trends, and last 24 h imaging results.  Jill Alexanders, PA-C [facility] 12/12/2021, 12:59 PM Please see Amion for pager number during day hours 7:00am-4:30pm or 7:00am -11:30am on weekends

## 2021-12-12 NOTE — ED Provider Notes (Signed)
  Physical Exam  BP (!) 111/59 (BP Location: Left Arm)   Pulse 89   Temp 98.2 F (36.8 C) (Oral)   Resp 18   Ht 5' (1.524 m)   Wt 49.9 kg   SpO2 95%   BMI 21.48 kg/m   Physical Exam Vitals and nursing note reviewed.  Constitutional:      General: She is not in acute distress.    Appearance: She is well-developed.  HENT:     Head: Normocephalic and atraumatic.  Eyes:     Conjunctiva/sclera: Conjunctivae normal.  Cardiovascular:     Rate and Rhythm: Normal rate and regular rhythm.     Heart sounds: No murmur heard. Pulmonary:     Effort: Pulmonary effort is normal. No respiratory distress.  Abdominal:     Tenderness: There is abdominal tenderness.  Musculoskeletal:        General: No swelling.     Cervical back: Neck supple.  Skin:    General: Skin is warm and dry.     Capillary Refill: Capillary refill takes less than 2 seconds.  Neurological:     Mental Status: She is alert.  Psychiatric:        Mood and Affect: Mood normal.     Procedures  Procedures  ED Course / MDM   Clinical Course as of 12/12/21 1241  Sat Dec 12, 2021  0706 Pending surgery, possibly home on abx [MK]    Clinical Course User Index [MK] Mike Berntsen, Debe Coder, MD   Medical Decision Making Risk Prescription drug management. Decision regarding hospitalization.   Patient received in handoff.  Diverticulitis and UTI.  Pending surgery evaluation.  I spoke with general surgeon on-call Dr. Bobbye Morton who evaluated the images and is recommending medical admission with serial abdominal exams and antibiotics.  Patient then admitted to medicine       Teressa Lower, MD 12/12/21 1242

## 2021-12-12 NOTE — ED Provider Notes (Signed)
[facility] EMERGENCY DEPARTMENT Provider Note  CSN: [id number] Arrival date & time: 12/11/21 1527  Chief Complaint(s) Anorexia and Weakness  HPI Tracy Estrada is a 71 y.o. female with a past medical history listed below including recent diverticulitis complicated by intraluminal abscess and perforation requiring PERC drain currently in place who presents to the emergency department with 1-1/2 weeks of decreased appetite and nausea.  She reports that last Monday, she felt like she had subjective fevers and thought she had COVID but tested negative at home.  Since then she has had decreased appetite and postprandial nausea.  She denies any abdominal pain.  No diarrhea.  No dysuria but does report urinary frequency.  She reports that she is scheduled to have a colonoscopy in the next 2 weeks to determine need for surgery.  The history is provided by the patient.    Past Medical History Past Medical History:  Diagnosis Date   Achilles bursitis or tendinitis    Anxiety state, unspecified    Arthritis    Closed fracture of unspecified part of fibula    Depressive disorder, not elsewhere classified    Diverticulosis of colon (without mention of hemorrhage)    Edema    Fibromyalgia    Insomnia, unspecified    Lumbago    Mass of left ovary 06/01/2016   Meniere's disease 1986   MVP (mitral valve prolapse)    Other intra-abdominal and pelvic swelling, mass and lump 06/01/2016   Pain in joint, site unspecified    Peptic ulcer    no testing   Pure hypercholesterolemia    TSH elevation    Patient Active Problem List   Diagnosis Date Noted   Acute pain of left knee 11/02/2021   Memory changes 07/21/2021   Intra-abdominal abscess ([place]) 06/21/2021   Normocytic anemia 06/21/2021   Right pelvic adnexal fluid collection 06/21/2021   Acute diverticulitis 06/20/2021   Neutropenia ([place]) 01/28/2020   Chronic right shoulder pain 01/28/2020   Foul smelling urine 09/04/2019    Medicare annual wellness visit, subsequent 01/25/2019   Hyperlipidemia 01/25/2019   Osteoporosis 01/25/2019   Rash and nonspecific skin eruption 04/13/2018   Fibromyalgia 09/16/2015   Anxiety and depression 09/16/2015   Insomnia 09/16/2015   Genetic testing 03/21/2015   Family history of breast cancer in sister 02/27/2015   Family history of ovarian cancer 02/27/2015   Family history of colon cancer 02/27/2015   Home Medication(s) Prior to Admission medications   Medication Sig Start Date End Date Taking? Authorizing Provider  acetaminophen (TYLENOL) 325 MG tablet Take 650 mg by mouth every 6 (six) hours as needed for moderate pain or headache.    [provider]  buPROPion (WELLBUTRIN XL) 150 MG 24 hr tablet Take 2 tablets (300 mg total) by mouth daily. For depression. 03/24/21   [name], NP  busPIRone (BUSPAR) 5 MG tablet TAKE 1 TABLET (5 MG TOTAL) BY MOUTH 2 (TWO) TIMES DAILY. FOR ANXIETY 11/06/21   [name], NP  Cholecalciferol (VITAMIN D3 PO) Take 1 tablet by mouth daily. Unsure of mg    [provider]  fluticasone (FLONASE) 50 MCG/ACT nasal spray Place 2 sprays into the nose daily as needed for allergies.     [provider]  Multiple Vitamins-Minerals (MULTIVITAMIN WITH MINERALS) tablet Take 1 tablet by mouth daily. Centrum Silver for Women    [provider]  Omega-3 Fatty Acids (FISH OIL) 500 MG CAPS Take 500 mg by mouth daily.  [provider]  POTASSIUM PO Take 99 mg by mouth daily.    [provider]  pravastatin (PRAVACHOL) 40 MG tablet TAKE 1 TABLET BY MOUTH EVERY DAY FOR CHOLESTEROL Patient taking differently: Take 40 mg by mouth at bedtime. 05/17/21   [name], NP  vitamin B-12 (CYANOCOBALAMIN) 1000 MCG tablet Take 1,000 mcg by mouth daily.    [provider]                                                                                                                                     Allergies Demerol [meperidine], Kenalog [triamcinolone acetonide], and Adhesive [tape]  Review of Systems Review of Systems As noted in HPI  Physical Exam Vital Signs  I have reviewed the triage vital signs BP (!) 110/51   Pulse 82   Temp 98.7 F (37.1 C) (Oral)   Resp 16   Ht 5' (1.524 m)   Wt 49.9 kg   SpO2 95%   BMI 21.48 kg/m   Physical Exam Vitals reviewed.  Constitutional:      General: She is not in acute distress.    Appearance: She is well-developed. She is not diaphoretic.  HENT:     Head: Normocephalic and atraumatic.     Right Ear: External ear normal.     Left Ear: External ear normal.     Nose: Nose normal.  Eyes:     General: No scleral icterus.    Conjunctiva/sclera: Conjunctivae normal.  Neck:     Trachea: Phonation normal.  Cardiovascular:     Rate and Rhythm: Normal rate and regular rhythm.  Pulmonary:     Effort: Pulmonary effort is normal. No respiratory distress.     Breath sounds: No stridor.  Abdominal:     General: There is no distension.     Tenderness: There is abdominal tenderness in the left lower quadrant. There is no guarding or rebound.    Musculoskeletal:        General: Normal range of motion.     Cervical back: Normal range of motion.  Neurological:     Mental Status: She is alert and oriented to person, place, and time.  Psychiatric:        Behavior: Behavior normal.     ED Results and Treatments Labs (all labs ordered are listed, but only abnormal results are displayed) Labs Reviewed  COMPREHENSIVE METABOLIC PANEL - Abnormal; Notable for the following components:      Result Value   Glucose, Bld 113 (*)    Total Bilirubin 0.2 (*)    All other components within normal limits  URINALYSIS, ROUTINE W REFLEX MICROSCOPIC - Abnormal; Notable for the following components:   APPearance HAZY (*)    Nitrite POSITIVE (*)    Leukocytes,Ua TRACE (*)    Bacteria, UA MANY (*)    All other components within normal  limits  I-STAT CHEM 8,  ED - Abnormal; Notable for the following components:   BUN 26 (*)    Glucose, Bld 112 (*)    All other components within normal limits  LIPASE, BLOOD  CBC WITH DIFFERENTIAL/PLATELET                                                                                                                         EKG  EKG Interpretation  Date/Time:    Ventricular Rate:    PR Interval:    QRS Duration:   QT Interval:    QTC Calculation:   R Axis:     Text Interpretation:         Radiology CT ABDOMEN PELVIS W CONTRAST  Result Date: 12/12/2021 CLINICAL DATA:  71 year old female with acute abdominal pain. History of perforated diverticulitis and chronic fistula, chronic pelvic drain. EXAM: CT ABDOMEN AND PELVIS WITH CONTRAST TECHNIQUE: Multidetector CT imaging of the abdomen and pelvis was performed using the standard protocol following bolus administration of intravenous contrast. RADIATION DOSE REDUCTION: This exam was performed according to the departmental dose-optimization program which includes automated exposure control, adjustment of the mA and/or kV according to patient size and/or use of iterative reconstruction technique. CONTRAST:  38m OMNIPAQUE IOHEXOL 350 MG/ML SOLN COMPARISON:  CT Abdomen and Pelvis 08/11/2021 and earlier. FINDINGS: Lower chest: Lower lung volumes with mild dependent atelectasis now at both lung bases. No pericardial or pleural effusion. Hepatobiliary: Negative liver and gallbladder. Pancreas: Stable and within normal limits. Spleen: Negative. Adrenals/Urinary Tract: Adrenal glands remain within normal limits. New bilateral renal pelvis and collecting system enlargement compatible with mild hydronephrosis. Symmetric renal enhancement and there is contrast excretion on delayed images. Symmetric contrast excretion to the ureters, which remain decompressed and traverse the bowel and mesenteric inflammation at the pelvic inlet to the bladder as seen on  series 8, image 52. No significant ureteral distortion is evident. There is evidence of soft tissue thickening and adhesion to the abnormal sigmoid mesentery at the dome of the bladder on sagittal image 63. But otherwise only mild bladder wall thickening and no gas or oral contrast within the bladder to suggest fistula. Stomach/Bowel: Oral contrast has reached the rectum without evidence of obstruction. Chronic percutaneous left lower quadrant pigtail drain terminates in the abnormal sigmoid mesentery, stable since June. Regional moderate sigmoid inflammation and wall thickening which appears mildly progressed compared to the June CT. The catheter is located at the proximal 3rd of the inflamed bowel segment. A small focus of fluid in the distal sigmoid mesentery has regressed since June with trace residual now visible on coronal image 44. Regional small bowel loops are nondilated and opacified with oral contrast, but multiple loops now appear likely adhesed to the abnormal sigmoid including along the cephalad margin coronal image 23 and caudal margin image 27. No extraluminal gas or contrast identified. Upstream descending colon remarkable for diverticulosis but no active inflammation. Similar transverse and ascending colon diverticulosis with no active inflammation. Terminal ileum appears negative.  Appendix not delineated. Stomach is mostly decompressed. There is a relatively large 4.5 cm diverticulum of the proximal duodenum, with no associated inflammation. No free air or free fluid. Vascular/Lymphatic: Major arterial structures in the abdomen and pelvis remain patent with mild for age Aortoiliac calcified atherosclerosis. Portal venous system is patent. No lymphadenopathy identified. Reproductive: Surgically absent uterus as before. Ovaries not well delineated. Other: No pelvic free fluid. Musculoskeletal: Stable visualized osseous structures. IMPRESSION: 1. Complicated Acute on chronic Sigmoid Diverticulitis  with progression since a June CT in the form of increasing regional small bowel and bladder dome adhesions to the abnormal sigmoid colon. No associated bowel obstruction. No evidence of acute perforation. No evidence of colovesical fistula at this time. 2. Also there is new mild bilateral hydronephrosis, but symmetric renal contrast excretion and opacified ureters are visible on series 8 tracking to the bladder without no significant ureteral stricture or distortion. 3. Chronic left lower quadrant pelvic drain appears stable. No residual/recurrent abscess. 4. Generalized underlying colon diverticulosis. Duodenal diverticulum. Electronically Signed   By: Genevie Ann M.D.   On: 12/12/2021 04:38    Medications Ordered in ED Medications  sodium chloride 0.9 % bolus 1,000 mL (0 mLs Intravenous Stopped 12/12/21 0527)    And  0.9 %  sodium chloride infusion ( Intravenous New Bag/Given 12/12/21 0204)  iohexol (OMNIPAQUE) 9 MG/ML oral solution (  Canceled Entry 12/12/21 0540)  cefTRIAXone (ROCEPHIN) 1 g in sodium chloride 0.9 % 100 mL IVPB (0 g Intravenous Stopped 12/12/21 0527)  iohexol (OMNIPAQUE) 350 MG/ML injection 75 mL (75 mLs Intravenous Contrast Given 12/12/21 0350)                                                                                                                                     Procedures Procedures  (including critical care time)  Medical Decision Making / ED Course   Medical Decision Making Amount and/or Complexity of Data Reviewed External Data Reviewed: notes. Labs: ordered. Decision-making details documented in ED Course. Radiology: ordered and independent interpretation performed. Decision-making details documented in ED Course. Discussion of management or test interpretation with external provider(s): General surgery clinic note mention patient is scheduled for colonoscopy.  Following that there we will discuss need for surgery.    Patient presents with postprandial nausea  and decreased appetite. History of diverticulitis with abscess and perforation requiring PERC drain in place. Mild discomfort to the left lower quadrant.  We will obtain labs and imaging to assess for any complication from diverticulitis. We will also assess for urinary tract infection.  CBC without leukocytosis or anemia. Metabolic panel without significant electrolyte derangement or renal sufficiency. No evidence of bili obstruction or pancreatitis. UA is consistent with urinary tract infection CT of the abdomen notable for acute on chronic diverticulitis without abscess formation or perforation.  PERC drain is in correct positioning without recurrence of her abscess.  Patient was started on empiric  antibiotics. Consulted general surgery to determine need for admission versus DC home with antibiotics and continue current plan for colonoscopy and surgery evaluation.      Final Clinical Impression(s) / ED Diagnoses Final diagnoses:  Generalized weakness  Acute lower UTI  Diverticulitis           This chart was dictated using voice recognition software.  Despite best efforts to proofread,  errors can occur which can change the documentation meaning.    Fatima Blank, MD 12/12/21 [phone]

## 2021-12-13 ENCOUNTER — Encounter: Payer: Self-pay | Admitting: Certified Registered Nurse Anesthetist

## 2021-12-13 ENCOUNTER — Inpatient Hospital Stay: Payer: Self-pay

## 2021-12-13 DIAGNOSIS — K5732 Diverticulitis of large intestine without perforation or abscess without bleeding: Secondary | ICD-10-CM | POA: Diagnosis not present

## 2021-12-13 LAB — CBC
HCT: 34 % — ABNORMAL LOW (ref 36.0–46.0)
Hemoglobin: 11.9 g/dL — ABNORMAL LOW (ref 12.0–15.0)
MCH: 33.7 pg (ref 26.0–34.0)
MCHC: 35 g/dL (ref 30.0–36.0)
MCV: 96.3 fL (ref 80.0–100.0)
Platelets: 259 10*3/uL (ref 150–400)
RBC: 3.53 MIL/uL — ABNORMAL LOW (ref 3.87–5.11)
RDW: 11.6 % (ref 11.5–15.5)
WBC: 5.4 10*3/uL (ref 4.0–10.5)
nRBC: 0 % (ref 0.0–0.2)

## 2021-12-13 LAB — BASIC METABOLIC PANEL
Anion gap: 9 (ref 5–15)
BUN: 9 mg/dL (ref 8–23)
CO2: 27 mmol/L (ref 22–32)
Calcium: 9.6 mg/dL (ref 8.9–10.3)
Chloride: 101 mmol/L (ref 98–111)
Creatinine, Ser: 0.77 mg/dL (ref 0.44–1.00)
GFR, Estimated: >60 mL/min (ref >60)
Glucose, Bld: 102 mg/dL — ABNORMAL HIGH (ref 70–99)
Potassium: 3.7 mmol/L (ref 3.5–5.1)
Sodium: 137 mmol/L (ref 135–145)

## 2021-12-13 LAB — PHOSPHORUS: Phosphorus: 4.3 mg/dL (ref 2.5–4.6)

## 2021-12-13 LAB — GLUCOSE, CAPILLARY
Glucose-Capillary: 103 mg/dL — ABNORMAL HIGH (ref 70–99)
Glucose-Capillary: 110 mg/dL — ABNORMAL HIGH (ref 70–99)

## 2021-12-13 LAB — PREALBUMIN: Prealbumin: 10 mg/dL — ABNORMAL LOW (ref 18–38)

## 2021-12-13 LAB — MAGNESIUM: Magnesium: 2.1 mg/dL (ref 1.7–2.4)

## 2021-12-13 MED ORDER — BUPROPION HCL ER (XL) 150 MG PO TB24
300.0000 mg | ORAL_TABLET | Freq: Every day | ORAL | Status: DC
  Administered 2021-12-13 – 2021-12-29 (×16): 300 mg via ORAL
  Filled 2021-12-13 (×18): qty 2

## 2021-12-13 MED ORDER — PANTOPRAZOLE SODIUM 40 MG IV SOLR
40.0000 mg | Freq: Every day | INTRAVENOUS | Status: DC
  Administered 2021-12-13 – 2021-12-27 (×15): 40 mg via INTRAVENOUS
  Filled 2021-12-13 (×15): qty 10

## 2021-12-13 MED ORDER — ENSURE ENLIVE PO LIQD
237.0000 mL | Freq: Three times a day (TID) | ORAL | Status: DC
  Administered 2021-12-17: 237 mL via ORAL

## 2021-12-13 MED ORDER — SODIUM CHLORIDE 0.9% FLUSH
10.0000 mL | Freq: Two times a day (BID) | INTRAVENOUS | Status: DC
  Administered 2021-12-13 – 2021-12-28 (×16): 10 mL

## 2021-12-13 MED ORDER — CHLORHEXIDINE GLUCONATE CLOTH 2 % EX PADS
6.0000 | MEDICATED_PAD | Freq: Every day | CUTANEOUS | Status: DC
  Administered 2021-12-13 – 2021-12-28 (×15): 6 via TOPICAL

## 2021-12-13 MED ORDER — SODIUM CHLORIDE 0.9% FLUSH
10.0000 mL | INTRAVENOUS | Status: DC | PRN
  Administered 2021-12-16 – 2021-12-25 (×9): 10 mL

## 2021-12-13 MED ORDER — LOPERAMIDE HCL 2 MG PO CAPS
2.0000 mg | ORAL_CAPSULE | Freq: Four times a day (QID) | ORAL | Status: DC | PRN
  Administered 2021-12-13 – 2021-12-19 (×5): 2 mg via ORAL
  Filled 2021-12-13 (×5): qty 1

## 2021-12-13 MED ORDER — INSULIN ASPART 100 UNIT/ML IJ SOLN: 0.0000 [IU] | INTRAMUSCULAR | Status: DC

## 2021-12-13 MED ORDER — BUSPIRONE HCL 5 MG PO TABS
5.0000 mg | ORAL_TABLET | Freq: Two times a day (BID) | ORAL | Status: DC
  Administered 2021-12-13 – 2021-12-29 (×29): 5 mg via ORAL
  Filled 2021-12-13 (×33): qty 1

## 2021-12-13 MED ORDER — TRACE MINERALS CU-MN-SE-ZN 300-55-60-3000 MCG/ML IV SOLN
INTRAVENOUS | Status: AC
  Filled 2021-12-13: qty 313.6

## 2021-12-13 NOTE — Progress Notes (Signed)
[facility] Progress Note     Subjective: CC: Patient is doing well this morning, sitting upright in bed. States she feels fine, denies issues with pain, nausea, or vomiting. Tried some spray to help with odor from drain, which she appreciated. States she wants to advance diet, including Boost from home and a sweet tea. Reports her intake of solid food has decreased a lot over the last at least 2-4 weeks, she still drinks boost but no longer is able to drink 3 per day.  Objective: Vital signs in last 24 hours: Temp:  [98 F (36.7 C)-99 F (37.2 C)] 98.1 F (36.7 C) (10/08 0742) Pulse Rate:  [86-95] 86 (10/08 0742) Resp:  [16-18] 16 (10/08 0742) BP: (101-149)/(59-75) 149/64 (10/08 0742) SpO2:  [92 %-99 %] 96 % (10/08 0742)    Intake/Output from previous day: 10/07 0701 - 10/08 0700 In: 2178.3 [P.O.:120; I.V.:2058.3] Out: -  Intake/Output this shift: No intake/output data recorded.  PE: Gen:  Alert, NAD, pleasant Card:  Regular rate and rhythm, pedal pulses 2+ BL Pulm:  Normal effort, clear to auscultation bilaterally Abd: Soft, non-tender, non-distended, bowel sounds present in all 4 quadrants, no HSM, drain continues to have feculent output. Skin: warm and dry, no rashes  Psych: A&Ox3   Lab Results:  Recent Labs    12/11/21 1644 12/11/21 1654 12/13/21 0231  WBC 6.8  --  5.4  HGB 12.9 12.6 11.9*  HCT 38.4 37.0 34.0*  PLT 316  --  259   BMET Recent Labs    12/11/21 1644 12/11/21 1654 12/13/21 0231  NA 137 138 137  K 4.3 4.1 3.7  CL 100 99 101  CO2 28  --  27  GLUCOSE 113* 112* 102*  BUN 23 26* 9  CREATININE 0.66 0.60 0.77  CALCIUM 10.2  --  9.6   PT/INR No results for input(s): "LABPROT", "INR" in the last 72 hours. CMP     Component Value Date/Time   NA 137 12/13/2021 0231   NA 141 04/07/2016 1348   K 3.7 12/13/2021 0231   CL 101 12/13/2021 0231   CO2 27 12/13/2021 0231   GLUCOSE 102 (H) 12/13/2021 0231   BUN 9 12/13/2021 0231   BUN  16 04/07/2016 1348   CREATININE 0.77 12/13/2021 0231   CREATININE 0.62 09/16/2015 1245   CALCIUM 9.6 12/13/2021 0231   PROT 6.8 12/11/2021 1644   PROT 6.7 04/07/2016 1348   ALBUMIN 3.6 12/11/2021 1644   ALBUMIN 4.5 04/07/2016 1348   AST 22 12/11/2021 1644   ALT 16 12/11/2021 1644   ALKPHOS 54 12/11/2021 1644   BILITOT 0.2 (L) 12/11/2021 1644   BILITOT 0.4 04/07/2016 1348   GFRNONAA >60 12/13/2021 0231   GFRAA >60 06/03/2016 1101   Lipase     Component Value Date/Time   LIPASE 23 12/11/2021 1644       Studies/Results: [place] EKG SITE RITE  Result Date: 12/13/2021 If Site Rite image not attached, placement could not be confirmed due to current cardiac rhythm.  CT ABDOMEN PELVIS W CONTRAST  Result Date: 12/12/2021 CLINICAL DATA:  71 year old female with acute abdominal pain. History of perforated diverticulitis and chronic fistula, chronic pelvic drain. EXAM: CT ABDOMEN AND PELVIS WITH CONTRAST TECHNIQUE: Multidetector CT imaging of the abdomen and pelvis was performed using the standard protocol following bolus administration of intravenous contrast. RADIATION DOSE REDUCTION: This exam was performed according to the departmental dose-optimization program which includes automated exposure control, adjustment of the mA and/or  kV according to patient size and/or use of iterative reconstruction technique. CONTRAST:  59m OMNIPAQUE IOHEXOL 350 MG/ML SOLN COMPARISON:  CT Abdomen and Pelvis 08/11/2021 and earlier. FINDINGS: Lower chest: Lower lung volumes with mild dependent atelectasis now at both lung bases. No pericardial or pleural effusion. Hepatobiliary: Negative liver and gallbladder. Pancreas: Stable and within normal limits. Spleen: Negative. Adrenals/Urinary Tract: Adrenal glands remain within normal limits. New bilateral renal pelvis and collecting system enlargement compatible with mild hydronephrosis. Symmetric renal enhancement and there is contrast excretion on delayed images.  Symmetric contrast excretion to the ureters, which remain decompressed and traverse the bowel and mesenteric inflammation at the pelvic inlet to the bladder as seen on series 8, image 52. No significant ureteral distortion is evident. There is evidence of soft tissue thickening and adhesion to the abnormal sigmoid mesentery at the dome of the bladder on sagittal image 63. But otherwise only mild bladder wall thickening and no gas or oral contrast within the bladder to suggest fistula. Stomach/Bowel: Oral contrast has reached the rectum without evidence of obstruction. Chronic percutaneous left lower quadrant pigtail drain terminates in the abnormal sigmoid mesentery, stable since June. Regional moderate sigmoid inflammation and wall thickening which appears mildly progressed compared to the June CT. The catheter is located at the proximal 3rd of the inflamed bowel segment. A small focus of fluid in the distal sigmoid mesentery has regressed since June with trace residual now visible on coronal image 44. Regional small bowel loops are nondilated and opacified with oral contrast, but multiple loops now appear likely adhesed to the abnormal sigmoid including along the cephalad margin coronal image 23 and caudal margin image 27. No extraluminal gas or contrast identified. Upstream descending colon remarkable for diverticulosis but no active inflammation. Similar transverse and ascending colon diverticulosis with no active inflammation. Terminal ileum appears negative. Appendix not delineated. Stomach is mostly decompressed. There is a relatively large 4.5 cm diverticulum of the proximal duodenum, with no associated inflammation. No free air or free fluid. Vascular/Lymphatic: Major arterial structures in the abdomen and pelvis remain patent with mild for age Aortoiliac calcified atherosclerosis. Portal venous system is patent. No lymphadenopathy identified. Reproductive: Surgically absent uterus as before. Ovaries not  well delineated. Other: No pelvic free fluid. Musculoskeletal: Stable visualized osseous structures. IMPRESSION: 1. Complicated Acute on chronic Sigmoid Diverticulitis with progression since a June CT in the form of increasing regional small bowel and bladder dome adhesions to the abnormal sigmoid colon. No associated bowel obstruction. No evidence of acute perforation. No evidence of colovesical fistula at this time. 2. Also there is new mild bilateral hydronephrosis, but symmetric renal contrast excretion and opacified ureters are visible on series 8 tracking to the bladder without no significant ureteral stricture or distortion. 3. Chronic left lower quadrant pelvic drain appears stable. No residual/recurrent abscess. 4. Generalized underlying colon diverticulosis. Duodenal diverticulum. Electronically Signed   By: HGenevie AnnM.D.   On: 12/12/2021 04:38    Anti-infectives: Anti-infectives (From admission, onward)    Start     Dose/Rate Route Frequency Ordered Stop   12/12/21 1400  piperacillin-tazobactam (ZOSYN) IVPB 3.375 g        3.375 g 12.5 mL/hr over 240 Minutes Intravenous Every 8 hours 12/12/21 0738 12/17/21 1359   12/12/21 0745  piperacillin-tazobactam (ZOSYN) IVPB 3.375 g        3.375 g 100 mL/hr over 30 Minutes Intravenous  Once 12/12/21 0740 12/12/21 0922   12/12/21 0300  cefTRIAXone (ROCEPHIN) 1 g in sodium  chloride 0.9 % 100 mL IVPB        1 g 200 mL/hr over 30 Minutes Intravenous  Once 12/12/21 0252 12/12/21 0527        Assessment/Plan Acute on chronic sigmoid diverticulitis with perforation, colonic fistula with IR drain - Afebrile, VSS, WBC WNL - No emergent surgical need. Continue IV antibiotic, continue FLD + Boost or Ensure TID. Given her very low prealbumin (10), and possible need for OR, add TPN to supplement nutrition. - CCS will follow closely. She appears non-toxic and overall well but, given weeks of poor appetite, fatigue, subjective chills, discomfort, I suspect she  may need an operation this admission which would be exploratory laparotomy, partial colectomy, and end colostomy.   FEN: FLD, Ensure or Boost BID or TID. TPN to start today. VTE: SCDs, Enoxaparin ID: [email or web address], UA with UTI, culture pending. UTI asymptomatic. Dispo: Continue IV abx, FLD, TPN.   Surgical planning -- this admission vs continuing to try to get her to an elective operation with Dr. Dema Severin   LOS: 1 day   I reviewed last 24 h vitals and pain scores, last 48 h intake and output, last 24 h labs and trends, and last 24 h imaging results.  This care required moderate level of medical decision making.    Fontaine No, PA-S  Obie Dredge, [facility] Please see Amion for pager number during day hours 7:00am-4:30pm

## 2021-12-13 NOTE — Progress Notes (Signed)
Initial Nutrition Assessment  DOCUMENTATION CODES:   Not applicable  INTERVENTION:   TPN management per pharmacy Ensure Enlive po TID, each supplement provides 350 kcal and 20 grams of protein.  NUTRITION DIAGNOSIS:   Increased nutrient needs related to acute illness as evidenced by estimated needs.  GOAL:   Patient will meet greater than or equal to 90% of their needs  MONITOR:   PO intake, Labs, I & O's, Supplement acceptance  REASON FOR ASSESSMENT:   Consult New TPN/TNA  ASSESSMENT:   71 y.o. female presented to the ED with weakness, N/V, decreased appetite. PMH includes HLD, fibromyalgia, anxiety, and diverticulitis  w/ cutaneous drains in place. Pt admitted with sigmoid diverticulitis and UTI.   10/06 - admitted 10/07 - diet advanced to FLD  RD working remotely at time of assessment. Per surgery note, plan to start TPN today due to decreased appetite and possible need for surgery this admission. RD attempted to speak with pt via phone; pt unavailable.  Per EMR, pt has had a 9% weight loss within 10 months.   Medications reviewed and include: NovoLog SSI, Protonix, IV antibiotics Labs reviewed.   NUTRITION - FOCUSED PHYSICAL EXAM:  Deferred to follow-up.  Diet Order:   Diet Order             Diet full liquid Room service appropriate? Yes; Fluid consistency: Thin  Diet effective now                   EDUCATION NEEDS:   No education needs have been identified at this time  Skin:  Skin Assessment: Reviewed RN Assessment  Last BM:  Unknown  Height:   Ht Readings from Last 1 Encounters:  12/11/21 5' (1.524 m)    Weight:   Wt Readings from Last 1 Encounters:  12/11/21 49.9 kg   BMI:  Body mass index is 21.48 kg/m.  Estimated Nutritional Needs:   Kcal:  1600-1800  Protein:  80-95 grams  Fluid:  >/= 1.6 L    Hermina Barters RD, LDN Clinical Dietitian See [facility] for contact information.

## 2021-12-13 NOTE — Progress Notes (Signed)
Peripherally Inserted Central Catheter Placement  The IV Nurse has discussed with the patient and/or persons authorized to consent for the patient, the purpose of this procedure and the potential benefits and risks involved with this procedure.  The benefits include less needle sticks, lab draws from the catheter, and the patient may be discharged home with the catheter. Risks include, but not limited to, infection, bleeding, blood clot (thrombus formation), and puncture of an artery; nerve damage and irregular heartbeat and possibility to perform a PICC exchange if needed/ordered by physician.  Alternatives to this procedure were also discussed.  Bard Power PICC patient education guide, fact sheet on infection prevention and patient information card has been provided to patient /or left at bedside.    PICC Placement Documentation  PICC Double Lumen 09/62/83 Right Basilic 35 cm 0 cm (Active)  Indication for Insertion or Continuance of Line Administration of hyperosmolar/irritating solutions (i.e. TPN, Vancomycin, etc.) 12/13/21 1351  Exposed Catheter (cm) 0 cm 12/13/21 1351  Site Assessment Clean, Dry, Intact 12/13/21 1351  Lumen #1 Status Saline locked;Flushed;Blood return noted 12/13/21 1351  Lumen #2 Status Saline locked;Flushed;Blood return noted 12/13/21 1351  Dressing Type Transparent 12/13/21 1351  Dressing Status Antimicrobial disc in place;Clean, Dry, Intact 12/13/21 1351  Safety Lock Not Applicable 66/29/47 6546  Line Care Connections checked and tightened 12/13/21 1351  Line Adjustment (NICU/IV Team Only) No 12/13/21 1351  Dressing Intervention New dressing 12/13/21 1351  Dressing Change Due 12/20/21 12/13/21 1351       Rosalio Macadamia Chenice 12/13/2021, 1:52 PM

## 2021-12-13 NOTE — Progress Notes (Signed)
PHARMACY - TOTAL PARENTERAL NUTRITION CONSULT NOTE   Indication:  complicated diverticulitis  Patient Measurements: Height: 5' (152.4 cm) Weight: 49.9 kg (110 lb) IBW/kg (Calculated) : 45.5 TPN AdjBW (KG): 49.9 Body mass index is 21.48 kg/m. Usual Weight: 45 kg  Assessment:  71 yo F admitted on 10/7 who presented with weakness and 8lb weight loss over 2 weeks. Patient been eating "poorly" x~43monthat home with limited toleration d/t diverticulitis. (Has cutaneous drain in place since April 2023 for diverticulitis w/ abscess).   Glucose / Insulin: CBG<180, no SSI prior to TPN  Electrolytes:  K 3.7;  Phos 4.3; Mag 2.1; BMP WNL  Renal: Scr <1; BUN WNL Hepatic: LFTs 10/6 WNL  Intake / Output; MIVF: UO x3  GI Imaging: 10/7 CTAP colon diverticulosis; no recurrent abscess; complicated acute on chronic sigmoid diverticulitis with progression since June  GI Surgeries / Procedures:  Planning for GI operation next week per PA   Central access: PICC to be placed 10/8 TPN start date: 10/8  Nutritional Goals: Goal TPN rate is 70 mL/hr (provides 94 g of protein and 1792 kcals per day)  RD Assessment: Estimated Needs Total Energy Estimated Needs: 1600-1800 Total Protein Estimated Needs: 80-95 grams Total Fluid Estimated Needs: >/= 1.6 L  Current Nutrition:  Full liquids - not tolerating per PA Ensure TID   Plan:  Start TPN at 35 mL/hr at 1800 - provides 47g protein and 896kcal which provides ~50% of patient's kcal needs Standard electrolytes in TPN: Na 521m/L, K 5078mL, Ca 5mE14m, Mg 5mEq51m and Phos 15mmo21m Cl:Ac 1:1 Add standard MVI and trace elements to TPN Initiate Sensitive q4h SSI and adjust as needed  Monitor TPN labs on Mon/Thurs, daily TPN labs until stable   Tracy SingermD Clinical Pharmacist 12/13/2021 9:58 AM

## 2021-12-13 NOTE — Progress Notes (Addendum)
PROGRESS NOTE  Grant Henkes  EVO:[id number] DOB: [birth year] DOA: 12/11/2021 PCP: [name], NP   Brief Narrative:  Patient is a 71 year old female who presented to the ED with complaint of 2-week history of fatigue, chills, decreased appetite, vomiting.  She was admitted in April and was managed for perforated diverticulitis and was managed nonoperatively and eventually had IR drain placed, and was discharged.  Admitted for further management.  General surgery following   Assessment & Plan:  Principal Problem:   Sigmoid diverticulitis Active Problems:   Urinary tract infection   Bilateral hydronephrosis   Anxiety and depression   Hyperlipidemia   Acute on chronic sigmoid diverticulitis with perforation/colonic fistula: Status post drain placement by IR.  Was managed conservatively with antibiotics.  Presented with fever, chills, fatigue.  Currently afebrile, no leukocytosis, hemodynamically stable.  CT imaging showed slight progression of inflammatory changes around the sigmoid colon compared to last one.  General surgery following.  No plan for surgery.  Currently on antibiotics.  Full liquid diet. Blood culture will be sent. Started on TPN by surgery  Urinary tract infection/bilateral hydronephrosis: UA was positive for leukocytes, nitrates, bacteria.  Patient was also having increased frequency.  CT showed mild bilateral hydronephrosis without signs of stricture or fistula.  Currently on antibiotics.  Follow-up urine culture  Anxiety/ depression: On Wellbutrin  Hyperlipidemia: On pravastatin        DVT prophylaxis:enoxaparin (LOVENOX) injection 40 mg Start: 12/12/21 2200     Code Status: Full Code  Family Communication: None at the bedside  Patient status:Inpatient  Patient is from :Home  Anticipated discharge EX:HBZJ  Estimated DC date:2-3 days   Consultants: Surgery  Procedures:None  Antimicrobials:  Anti-infectives (From admission, onward)     Start     Dose/Rate Route Frequency Ordered Stop   12/12/21 1400  piperacillin-tazobactam (ZOSYN) IVPB 3.375 g        3.375 g 12.5 mL/hr over 240 Minutes Intravenous Every 8 hours 12/12/21 0738 12/17/21 1359   12/12/21 0745  piperacillin-tazobactam (ZOSYN) IVPB 3.375 g        3.375 g 100 mL/hr over 30 Minutes Intravenous  Once 12/12/21 0740 12/12/21 0922   12/12/21 0300  cefTRIAXone (ROCEPHIN) 1 g in sodium chloride 0.9 % 100 mL IVPB        1 g 200 mL/hr over 30 Minutes Intravenous  Once 12/12/21 0252 12/12/21 0527       Subjective:  Patient seen and examined at the bedside today.  Hemodynamically stable.  Lying comfortably in bed.  Feels much better today.  Denies any abdominal pain today, no nausea or vomiting. Objective: Vitals:   12/12/21 1949 12/13/21 0045 12/13/21 0546 12/13/21 0742  BP: 133/75 130/73 134/70 (!) 149/64  Pulse: 89 89 89 86  Resp: '16 16 16 16  '$ Temp: 98.4 F (36.9 C) 98.7 F (37.1 C) 98 F (36.7 C) 98.1 F (36.7 C)  TempSrc: Oral Oral Oral Oral  SpO2: 99% 92% 97% 96%  Weight:      Height:        Intake/Output Summary (Last 24 hours) at 12/13/2021 0755 Last data filed at 12/12/2021 2216 Gross per 24 hour  Intake 2178.33 ml  Output --  Net 2178.33 ml   Filed Weights   12/11/21 1614  Weight: 49.9 kg    Examination:  General exam: Overall comfortable, not in distress HEENT: PERRL Respiratory system:  no wheezes or crackles  Cardiovascular system: S1 & S2 heard, RRR.  Gastrointestinal system: Abdomen is  nondistended, soft .  Mild generalized tenderness.  Abdominal  drain Central nervous system: Alert and oriented Extremities: No edema, no clubbing ,no cyanosis Skin: No rashes, no ulcers,no icterus     Data Reviewed: I have personally reviewed following labs and imaging studies  CBC: Recent Labs  Lab 12/11/21 1644 12/11/21 1654 12/13/21 0231  WBC 6.8  --  5.4  NEUTROABS 4.2  --   --   HGB 12.9 12.6 11.9*  HCT 38.4 37.0 34.0*  MCV 99.2   --  96.3  PLT 316  --  122   Basic Metabolic Panel: Recent Labs  Lab 12/11/21 1644 12/11/21 1654 12/13/21 0231  NA 137 138 137  K 4.3 4.1 3.7  CL 100 99 101  CO2 28  --  27  GLUCOSE 113* 112* 102*  BUN 23 26* 9  CREATININE 0.66 0.60 0.77  CALCIUM 10.2  --  9.6     No results found for this or any previous visit (from the past 240 hour(s)).   Radiology Studies: CT ABDOMEN PELVIS W CONTRAST  Result Date: 12/12/2021 CLINICAL DATA:  71 year old female with acute abdominal pain. History of perforated diverticulitis and chronic fistula, chronic pelvic drain. EXAM: CT ABDOMEN AND PELVIS WITH CONTRAST TECHNIQUE: Multidetector CT imaging of the abdomen and pelvis was performed using the standard protocol following bolus administration of intravenous contrast. RADIATION DOSE REDUCTION: This exam was performed according to the departmental dose-optimization program which includes automated exposure control, adjustment of the mA and/or kV according to patient size and/or use of iterative reconstruction technique. CONTRAST:  54m OMNIPAQUE IOHEXOL 350 MG/ML SOLN COMPARISON:  CT Abdomen and Pelvis 08/11/2021 and earlier. FINDINGS: Lower chest: Lower lung volumes with mild dependent atelectasis now at both lung bases. No pericardial or pleural effusion. Hepatobiliary: Negative liver and gallbladder. Pancreas: Stable and within normal limits. Spleen: Negative. Adrenals/Urinary Tract: Adrenal glands remain within normal limits. New bilateral renal pelvis and collecting system enlargement compatible with mild hydronephrosis. Symmetric renal enhancement and there is contrast excretion on delayed images. Symmetric contrast excretion to the ureters, which remain decompressed and traverse the bowel and mesenteric inflammation at the pelvic inlet to the bladder as seen on series 8, image 52. No significant ureteral distortion is evident. There is evidence of soft tissue thickening and adhesion to the abnormal  sigmoid mesentery at the dome of the bladder on sagittal image 63. But otherwise only mild bladder wall thickening and no gas or oral contrast within the bladder to suggest fistula. Stomach/Bowel: Oral contrast has reached the rectum without evidence of obstruction. Chronic percutaneous left lower quadrant pigtail drain terminates in the abnormal sigmoid mesentery, stable since June. Regional moderate sigmoid inflammation and wall thickening which appears mildly progressed compared to the June CT. The catheter is located at the proximal 3rd of the inflamed bowel segment. A small focus of fluid in the distal sigmoid mesentery has regressed since June with trace residual now visible on coronal image 44. Regional small bowel loops are nondilated and opacified with oral contrast, but multiple loops now appear likely adhesed to the abnormal sigmoid including along the cephalad margin coronal image 23 and caudal margin image 27. No extraluminal gas or contrast identified. Upstream descending colon remarkable for diverticulosis but no active inflammation. Similar transverse and ascending colon diverticulosis with no active inflammation. Terminal ileum appears negative. Appendix not delineated. Stomach is mostly decompressed. There is a relatively large 4.5 cm diverticulum of the proximal duodenum, with no associated inflammation. No free air  or free fluid. Vascular/Lymphatic: Major arterial structures in the abdomen and pelvis remain patent with mild for age Aortoiliac calcified atherosclerosis. Portal venous system is patent. No lymphadenopathy identified. Reproductive: Surgically absent uterus as before. Ovaries not well delineated. Other: No pelvic free fluid. Musculoskeletal: Stable visualized osseous structures. IMPRESSION: 1. Complicated Acute on chronic Sigmoid Diverticulitis with progression since a June CT in the form of increasing regional small bowel and bladder dome adhesions to the abnormal sigmoid colon. No  associated bowel obstruction. No evidence of acute perforation. No evidence of colovesical fistula at this time. 2. Also there is new mild bilateral hydronephrosis, but symmetric renal contrast excretion and opacified ureters are visible on series 8 tracking to the bladder without no significant ureteral stricture or distortion. 3. Chronic left lower quadrant pelvic drain appears stable. No residual/recurrent abscess. 4. Generalized underlying colon diverticulosis. Duodenal diverticulum. Electronically Signed   By: Genevie Ann M.D.   On: 12/12/2021 04:38    Scheduled Meds:  enoxaparin (LOVENOX) injection  40 mg Subcutaneous Q24H   feeding supplement  1 Container Oral TID BM   lip balm   Topical BID   pantoprazole (PROTONIX) IV  40 mg Intravenous Q24H   sodium chloride flush  3 mL Intravenous Q12H   Continuous Infusions:  ondansetron (ZOFRAN) IV     piperacillin-tazobactam (ZOSYN)  IV 3.375 g (12/13/21 0536)     LOS: 1 day   Shelly Coss, MD [facility] P10/10/2021, 7:55 AM

## 2021-12-14 ENCOUNTER — Encounter (HOSPITAL_COMMUNITY): Payer: Self-pay | Admitting: Internal Medicine

## 2021-12-14 DIAGNOSIS — K5732 Diverticulitis of large intestine without perforation or abscess without bleeding: Secondary | ICD-10-CM | POA: Diagnosis not present

## 2021-12-14 LAB — URINE CULTURE: Culture: >=100000 — AB

## 2021-12-14 LAB — COMPREHENSIVE METABOLIC PANEL
ALT: 12 U/L (ref 0–44)
AST: 13 U/L — ABNORMAL LOW (ref 15–41)
Albumin: 2.8 g/dL — ABNORMAL LOW (ref 3.5–5.0)
Alkaline Phosphatase: 39 U/L (ref 38–126)
Anion gap: 5 (ref 5–15)
BUN: 9 mg/dL (ref 8–23)
CO2: 29 mmol/L (ref 22–32)
Calcium: 9 mg/dL (ref 8.9–10.3)
Chloride: 103 mmol/L (ref 98–111)
Creatinine, Ser: 0.63 mg/dL (ref 0.44–1.00)
GFR, Estimated: >60 mL/min (ref >60)
Glucose, Bld: 147 mg/dL — ABNORMAL HIGH (ref 70–99)
Potassium: 3.2 mmol/L — ABNORMAL LOW (ref 3.5–5.1)
Sodium: 137 mmol/L (ref 135–145)
Total Bilirubin: 0.7 mg/dL (ref 0.3–1.2)
Total Protein: 6.1 g/dL — ABNORMAL LOW (ref 6.5–8.1)

## 2021-12-14 LAB — GLUCOSE, CAPILLARY
Glucose-Capillary: 102 mg/dL — ABNORMAL HIGH (ref 70–99)
Glucose-Capillary: 117 mg/dL — ABNORMAL HIGH (ref 70–99)
Glucose-Capillary: 118 mg/dL — ABNORMAL HIGH (ref 70–99)
Glucose-Capillary: 126 mg/dL — ABNORMAL HIGH (ref 70–99)
Glucose-Capillary: 138 mg/dL — ABNORMAL HIGH (ref 70–99)
Glucose-Capillary: 145 mg/dL — ABNORMAL HIGH (ref 70–99)

## 2021-12-14 LAB — TRIGLYCERIDES: Triglycerides: 87 mg/dL (ref <150)

## 2021-12-14 LAB — HEMOGLOBIN A1C
Hgb A1c MFr Bld: 5.4 % (ref 4.8–5.6)
Mean Plasma Glucose: 108.28 mg/dL

## 2021-12-14 LAB — PHOSPHORUS: Phosphorus: 3 mg/dL (ref 2.5–4.6)

## 2021-12-14 LAB — MAGNESIUM: Magnesium: 2.2 mg/dL (ref 1.7–2.4)

## 2021-12-14 MED ORDER — TRACE MINERALS CU-MN-SE-ZN 300-55-60-3000 MCG/ML IV SOLN: INTRAVENOUS | Status: DC

## 2021-12-14 MED ORDER — POTASSIUM CHLORIDE 10 MEQ/100ML IV SOLN
10.0000 meq | INTRAVENOUS | Status: AC
  Administered 2021-12-14 (×3): 10 meq via INTRAVENOUS
  Filled 2021-12-14 (×3): qty 100

## 2021-12-14 MED ORDER — INSULIN ASPART 100 UNIT/ML IJ SOLN
0.0000 [IU] | Freq: Four times a day (QID) | INTRAMUSCULAR | Status: DC
  Administered 2021-12-14 – 2021-12-15 (×4): 1 [IU] via SUBCUTANEOUS
  Administered 2021-12-16: 2 [IU] via SUBCUTANEOUS
  Administered 2021-12-16: 1 [IU] via SUBCUTANEOUS

## 2021-12-14 MED ORDER — TRACE MINERALS CU-MN-SE-ZN 300-55-60-3000 MCG/ML IV SOLN
INTRAVENOUS | Status: AC
  Filled 2021-12-14: qty 313.6

## 2021-12-14 MED ORDER — POTASSIUM CHLORIDE 10 MEQ/100ML IV SOLN: 10.0000 meq | INTRAVENOUS | Status: DC

## 2021-12-14 NOTE — Progress Notes (Signed)
Mobility Specialist Progress Note   12/14/21 1235  Mobility  Activity Stood at bedside (x20)  Level of Assistance Standby assist, set-up cues, supervision of patient - no hands on  Assistive Device Front wheel walker  Activity Response Tolerated well  $Mobility charge 1 Mobility   Pre Mobility: 78 HR, 111/64 BP, 100% SpO2  Pt having no c/o in bed upon arrival. Able to do x5 STS w/ RW's assistance and then an additional x15 STS w/o an AD. Only complaint was fatigue, returned back to bed w/o fault and all needs met.   Holland Falling Mobility Specialist MS [facility] #:  [phone] Acute Rehab Office:  [phone]

## 2021-12-14 NOTE — Progress Notes (Signed)
Nutrition Follow-up  DOCUMENTATION CODES:   Not applicable  INTERVENTION:  - Discontinue calorie count.   - Discontinue Ensure TID.   - Continue TPN.   NUTRITION DIAGNOSIS:   Moderate Malnutrition related to acute illness as evidenced by energy intake < 75% for > 7 days, percent weight loss, mild fat depletion, mild muscle depletion (5% wt loss in 2 weeks).  GOAL:   Patient will meet greater than or equal to 90% of their needs  MONITOR:   PO intake, Labs, I & O's, Supplement acceptance  REASON FOR ASSESSMENT:   Consult New TPN/TNA  ASSESSMENT:   71 y.o. female presented to the ED with weakness, N/V, decreased appetite. PMH includes HLD, fibromyalgia, anxiety, and diverticulitis  w/ cutaneous drains in place. Pt admitted with sigmoid diverticulitis and UTI.  Meds reviewed: Sliding scale insulin. IVF: TPN. Labs reviewed: K low (3.2). 100 mg of Thiamine added to TPN.   Calorie count ordered over the weekend. Spoke with Surgery PA, who states that the plan is for the pt to potentially have surgery this week. PA states that the pt will continue with TPN and full liquid diet for now. Discussed discontinuing calorie count for now and assessing post surgical intervention once diet is able to be advanced to assess caloric intakes, if needed.   Pt reports that she is drinking Boost supplements from home. She states that this is the only supplement that she really enjoys. Pt is currently ordered to receive Ensure TID, but does not want them, RD will discontinue supplements. Spoke with Pharmacist in regards to TPN. Pt is at risk for refeeding. Potassium and phos have both decreased since initiation of TPN. Pt is receiving about 900 kcals from TPN. Pt is also drinking Boost (High Protein) TID. This provides the pt with 750 kcals and 60 gm protein. With current TPN and Boost supplements, pt will receive 1650 kcals and 107 gm protein. Pt is currently meeting her need with TPN and PO intakes.    Pt's family reports that the pt has lost about 6 lbs in the past 2 weeks, which is significant.   NUTRITION - FOCUSED PHYSICAL EXAM: Flowsheet Row Most Recent Value  Orbital Region Mild depletion  Upper Arm Region No depletion  Thoracic and Lumbar Region No depletion  Buccal Region Mild depletion  Temple Region Mild depletion  Clavicle Bone Region No depletion  Clavicle and Acromion Bone Region No depletion  Scapular Bone Region No depletion  Dorsal Hand Mild depletion  Anterior Thigh Region No depletion  Posterior Calf Region No depletion  Edema (RD Assessment) None  Hair Reviewed  [pt reports that some of her hair is falling out]  Eyes Reviewed  Mouth Reviewed  Skin Reviewed  Nails Reviewed      Diet Order:   Diet Order             Diet full liquid Room service appropriate? Yes; Fluid consistency: Thin  Diet effective now                   EDUCATION NEEDS:   No education needs have been identified at this time  Skin:  Skin Assessment: Reviewed RN Assessment  Last BM:  12/13/21  Height:   Ht Readings from Last 1 Encounters:  12/11/21 5' (1.524 m)    Weight:   Wt Readings from Last 1 Encounters:  12/11/21 49.9 kg    Ideal Body Weight:  45.5 kg  BMI:  Body mass index is 21.48  kg/m.  Estimated Nutritional Needs:   Kcal:  1600-1800  Protein:  80-95 grams  Fluid:  >/= 1.6 L  Thalia Bloodgood, RD, LDN, CNSC

## 2021-12-14 NOTE — Progress Notes (Signed)
Mobility Specialist Progress Note   12/14/21 1645  Mobility  Activity Ambulated with assistance to bathroom  Level of Assistance Standby assist, set-up cues, supervision of patient - no hands on  Assistive Device Front wheel walker  Distance Ambulated (ft) 16 ft  Activity Response Tolerated well  $Mobility charge 1 Mobility   Pt requesting assistance to BR. No physical assistance for bed mobility but slight LOB upon standing. Ambulated to BR w/o fault and left in BR w/ NT notified.   Holland Falling Mobility Specialist MS [facility] #:  [phone] Acute Rehab Office:  [phone]

## 2021-12-14 NOTE — Progress Notes (Signed)
[facility] Progress Note     Subjective: CC:  NAEO. Drank some sweet tea and boost yesterday. Having BMs, denies nausea or vomiting.  Objective: Vital signs in last 24 hours: Temp:  [97.8 F (36.6 C)-99.3 F (37.4 C)] 98.6 F (37 C) (10/09 0455) Pulse Rate:  [80-89] 80 (10/09 0455) Resp:  [16-20] 20 (10/09 0455) BP: (98-125)/(59-92) 98/61 (10/09 0455) SpO2:  [94 %-98 %] 94 % (10/09 0455) Last BM Date : 12/13/21  Intake/Output from previous day: 10/08 0701 - 10/09 0700 In: 630.7 [P.O.:240; I.V.:255.5; IV Piggyback:135.3] Out: -  Intake/Output this shift: No intake/output data recorded.  PE: Gen:  Alert, NAD, pleasant Card:  Regular rate and rhythm, pedal pulses 2+ BL Pulm:  Normal effort, clear to auscultation bilaterally Abd: Soft, non-tender, non-distended, drain continues to have feculent output, mostly around the drain. Skin: warm and dry, no rashes  Psych: A&Ox3   Lab Results:  Recent Labs    12/11/21 1644 12/11/21 1654 12/13/21 0231  WBC 6.8  --  5.4  HGB 12.9 12.6 11.9*  HCT 38.4 37.0 34.0*  PLT 316  --  259   BMET Recent Labs    12/13/21 0231 12/14/21 0307  NA 137 137  K 3.7 3.2*  CL 101 103  CO2 27 29  GLUCOSE 102* 147*  BUN 9 9  CREATININE 0.77 0.63  CALCIUM 9.6 9.0   PT/INR No results for input(s): "LABPROT", "INR" in the last 72 hours. CMP     Component Value Date/Time   NA 137 12/14/2021 0307   NA 141 04/07/2016 1348   K 3.2 (L) 12/14/2021 0307   CL 103 12/14/2021 0307   CO2 29 12/14/2021 0307   GLUCOSE 147 (H) 12/14/2021 0307   BUN 9 12/14/2021 0307   BUN 16 04/07/2016 1348   CREATININE 0.63 12/14/2021 0307   CREATININE 0.62 09/16/2015 1245   CALCIUM 9.0 12/14/2021 0307   PROT 6.1 (L) 12/14/2021 0307   PROT 6.7 04/07/2016 1348   ALBUMIN 2.8 (L) 12/14/2021 0307   ALBUMIN 4.5 04/07/2016 1348   AST 13 (L) 12/14/2021 0307   ALT 12 12/14/2021 0307   ALKPHOS 39 12/14/2021 0307   BILITOT 0.7 12/14/2021 0307    BILITOT 0.4 04/07/2016 1348   GFRNONAA >60 12/14/2021 0307   GFRAA >60 06/03/2016 1101   Lipase     Component Value Date/Time   LIPASE 23 12/11/2021 1644     Studies/Results: [place] EKG SITE RITE  Result Date: 12/13/2021 If Site Rite image not attached, placement could not be confirmed due to current cardiac rhythm.   Anti-infectives: Anti-infectives (From admission, onward)    Start     Dose/Rate Route Frequency Ordered Stop   12/12/21 1400  piperacillin-tazobactam (ZOSYN) IVPB 3.375 g        3.375 g 12.5 mL/hr over 240 Minutes Intravenous Every 8 hours 12/12/21 0738 12/17/21 1359   12/12/21 0745  piperacillin-tazobactam (ZOSYN) IVPB 3.375 g        3.375 g 100 mL/hr over 30 Minutes Intravenous  Once 12/12/21 0740 12/12/21 0922   12/12/21 0300  cefTRIAXone (ROCEPHIN) 1 g in sodium chloride 0.9 % 100 mL IVPB        1 g 200 mL/hr over 30 Minutes Intravenous  Once 12/12/21 0252 12/12/21 0527        Assessment/Plan Acute on chronic sigmoid diverticulitis with perforation, colonic fistula with IR drain - Afebrile, VSS, WBC WNL - No emergent surgical needs. Continue IV antibiotics, continue FLD +  Boost or Ensure TID. Given her very low prealbumin (10), and possible need for OR, add TPN to supplement nutrition. - CCS will follow closely. She appears non-toxic and overall well but, given weeks of decreased appetite, decreased PO intake, fatigue, subjective chills, abdominal discomfort, I suspect she may need an operation this admission which would be exploratory laparotomy, partial colectomy, and end colostomy.   FEN: FLD, Ensure or Boost BID or TID. TPN. Repeat pre-albumin Thursday or Friday  VTE: SCDs, Enoxaparin ID: Zosyn, UA with UTI, culture pending. UTI asymptomatic. Dispo: Continue IV abx, FLD, TPN.   Surgical planning -- will discuss with Dr. Georgette Dover today. Would need WOC RN to mark her if surgery planned this admission.    LOS: 2 days   I reviewed last 24 h vitals and pain  scores, last 48 h intake and output, last 24 h labs and trends, and last 24 h imaging results.  This care required moderate level of medical decision making.    Obie Dredge, PA-C [facility] Please see Amion for pager number during day hours 7:00am-4:30pm

## 2021-12-14 NOTE — Progress Notes (Signed)
PHARMACY - TOTAL PARENTERAL NUTRITION CONSULT NOTE   Indication:  complicated diverticulitis  Patient Measurements: Height: 5' (152.4 cm) Weight: 49.9 kg (110 lb) IBW/kg (Calculated) : 45.5 TPN AdjBW (KG): 49.9 Body mass index is 21.48 kg/m. Usual Weight: 45 kg  Assessment:  71 yo F admitted on 10/7 who presented with weakness and 8lb weight loss over 2 weeks. Patient been eating "poorly" x~67monthat home with limited toleration d/t diverticulitis. (Has cutaneous drain in place since April 2023 for diverticulitis w/ abscess).   Glucose / Insulin: CBG<180, no SSI required, A1c 5.4% Electrolytes:  K 3.2; coCa 10; all others WNL  Renal: Scr <1; BUN WNL Hepatic: Alb 2.8, LFTs WNL  Intake / Output; MIVF: UOP: 3x/24h, LBM 10/8 GI Imaging: 10/7 CTAP colon diverticulosis; no recurrent abscess; complicated acute on chronic sigmoid diverticulitis with progression since June GI Surgeries / Procedures:  Planning for GI operation next week per PA   Central access: PICC (double-lumen) placed 10/8 TPN start date: 10/8  Nutritional Goals: Goal TPN rate is 70 mL/hr (provides 94 g of protein and 1792 kcals per day)  RD Assessment: Estimated Needs Total Energy Estimated Needs: 1600-1800 Total Protein Estimated Needs: 80-95 grams Total Fluid Estimated Needs: >/= 1.6 L  Current Nutrition:  TPN + Full liquids - not eating anything except Boost Pt does not like Ensure, but takes home Boost High Protein TID (250kcal, 20g protein per carton)  Plan:  Continue TPN to 35 mL/hr at 1800 - providing 47g protein and 896kcal which provides ~50% of patient's kcal needs due to high risk of refeeding syndrome Electrolytes in TPN: Na 560m/L, K 8040mL, Ca 5mE16m, Mg 7mEq59m and Phos 29mmo32m Cl:Ac 1:1 Add standard MVI and trace elements to TPN Add thiamine '100mg'$  to TPN daily x7 days for high risk of re-feeding syndrome Change Sensitive SSI frequency to q6h and adjust as needed  Monitor TPN labs on  Mon/Thurs, daily TPN labs until stable   Quanesha Klimaszewski Luisa HartmD, BCPS Clinical Pharmacist 12/14/2021 9:12 AM   Please refer to AMION for pharmacy phone number;a

## 2021-12-14 NOTE — Telephone Encounter (Signed)
Pt called returning a missed call she received? Didn't see any notes in pt'c chart. Pt stated she is in the hospital due to a UTI & other issues. Call back # [phone]

## 2021-12-14 NOTE — Telephone Encounter (Signed)
Unable to reach patient. She answers and then hangs up, so unable to leave voicemail.  Need to inform patient that the call she got from [place] was likely her automated appointment reminder for 10/10. Even though she cancelled the appointment it still sends if the system doesn't have enough time to process.  Please inform her to make a hospital follow up with [place] after she is discharged.

## 2021-12-14 NOTE — Consult Note (Signed)
[place] Nurse requested for preoperative stoma site marking  Discussed surgical procedure and stoma creation with patient and family.  Explained role of the [place] nurse team.  Provided the patient with educational booklet and provided samples of pouching options.  Answered patient and family questions.   Examined patient lying, sitting, and standing in order to place the marking in the patient's visual field, away from any creases or abdominal contour issues and within the rectus muscle.  Attempted to mark below the patient's belt line.   Marked for colostomy in the LLQ  __4__ cm to the left of the umbilicus and _7.7___LT  below the umbilicus. Marked for ileostomy in the RLQ 4 cm to the right of the umbilicus and 4cm below the umbilicus  Patient has IR drain in the LLQ that is draining foul smelling effluent with a saturated dressing and stat lock holding the drain.  I have taken all of the dressing down including taking the stat lock off because it is extremely soiled.  I have cleansed her skin and treated the denudation with skin prep. Prepped the skin under the new line anchor with alchol and secured the IR drain in place. Used small silicone foam dressing as a split gauze for absorption and additionally to stabilize tube. Additional split gauze placed on top but not secured with tape, using tube to hold in place.   [place] nursing team will follow up later this week for ostomy teaching/needs.    Patient's abdomen cleansed with CHG wipes at site markings, allowed to air dry prior to marking.Covered mark with thin film transparent dressing to preserve mark until date of surgery.   [place] Nurse team will follow up with patient after surgery for continue ostomy care and teaching.  [place] MSN, [place], [place], [place]

## 2021-12-14 NOTE — Consult Note (Signed)
Chief Complaint: Patient was seen in consultation today for complicated diverticulitis  Referring Physician(s): Dr. Georgette Dover  Supervising Physician: Arne Cleveland  Patient Status: [facility] - In-pt  History of Present Illness: Tracy Estrada is a 71 y.o. female with past medical history of complicated diverticulitis with abscess formation known to IR from prior drain placement 06/21/21 by Dr. Annamaria Boots.  Patient has undergone several drain evaluations over the past several months in the IR drain clinic which have consistently showed persistent fistula.  During evaluation 09/09/21, she was instructed to discontinue flushes. On 10/21/21 her drain was exchanged in hopes of disrupting the fistulous connection which did result in smaller, though persistent colonic fistula as documented 11/02/21. At last assessment 9/19, again fistula was demonstrated and she was scheduled for 2 month repeat injection with surgical follow-up.  Tracy Estrada now presents with increasing fatigue, chills, decreased appetite at home.  She has overall failed to improve since her last assessment.  Surgery consulted for possible inpatient surgical management.  In the meantime, IR asked to exchange drain due to leakage at the insertion site.     Past Medical History:  Diagnosis Date   Achilles bursitis or tendinitis    Anxiety state, unspecified    Arthritis    Closed fracture of unspecified part of fibula    Depressive disorder, not elsewhere classified    Diverticulosis of colon (without mention of hemorrhage)    Edema    Fibromyalgia    Insomnia, unspecified    Lumbago    Mass of left ovary 06/01/2016   Meniere's disease 1986   MVP (mitral valve prolapse)    Other intra-abdominal and pelvic swelling, mass and lump 06/01/2016   Pain in joint, site unspecified    Peptic ulcer    no testing   Pure hypercholesterolemia    TSH elevation     Past Surgical History:  Procedure Laterality Date   ABDOMINAL HYSTERECTOMY   03/08/1980   fibroids; ovaries intact.   BACK SURGERY     x2 Cervical and lumbar   COLONOSCOPY  2017   CYSTOSCOPY N/A 06/08/2016   Procedure: CYSTOSCOPY;  Surgeon: Will Bonnet, MD;  Location: [facility] ORS;  Service: Gynecology;  Laterality: N/A;   IR CATHETER TUBE CHANGE  10/21/2021   IR RADIOLOGIST EVAL & MGMT  07/10/2021   IR RADIOLOGIST EVAL & MGMT  08/11/2021   IR RADIOLOGIST EVAL & MGMT  09/09/2021   IR RADIOLOGIST EVAL & MGMT  10/09/2021   IR RADIOLOGIST EVAL & MGMT  11/02/2021   IR RADIOLOGIST EVAL & MGMT  11/24/2021   LAPAROSCOPIC LYSIS OF ADHESIONS  06/08/2016   Procedure: LAPAROSCOPIC LYSIS OF ADHESIONS;  Surgeon: Will Bonnet, MD;  Location: [facility];  Service: Gynecology;;   LAPAROSCOPIC SALPINGO OOPHERECTOMY Left 06/08/2016   Procedure: LAPAROSCOPIC SALPINGO OOPHORECTOMY;  Surgeon: Will Bonnet, MD;  Location: [facility] ORS;  Service: Gynecology;  Laterality: Left;   OOPHORECTOMY     left ovary   SALPINGECTOMY     left   SPINE SURGERY     lumbar spine 11/2015; cervical spine 2009. [place].    Allergies: Demerol [meperidine], Kenalog [triamcinolone acetonide], and Adhesive [tape]  Medications: Prior to Admission medications   Medication Sig Start Date End Date Taking? Authorizing Provider  acetaminophen (TYLENOL) 325 MG tablet Take 650 mg by mouth every 6 (six) hours as needed for moderate pain or headache.   Yes [provider]  buPROPion (WELLBUTRIN XL) 150 MG 24 hr tablet Take 2 tablets (  300 mg total) by mouth daily. For depression. 03/24/21  Yes [name], NP  busPIRone (BUSPAR) 5 MG tablet TAKE 1 TABLET (5 MG TOTAL) BY MOUTH 2 (TWO) TIMES DAILY. FOR ANXIETY 11/06/21  Yes [name], NP  Cholecalciferol (VITAMIN D3 PO) Take 1 tablet by mouth daily. Unsure of mg   Yes [provider]  fluticasone (FLONASE) 50 MCG/ACT nasal spray Place 2 sprays into the nose daily as needed for allergies.    Yes [provider]  Multiple  Vitamins-Minerals (MULTIVITAMIN WITH MINERALS) tablet Take 1 tablet by mouth daily. Centrum Silver for Women   Yes [provider]  Omega-3 Fatty Acids (FISH OIL) 500 MG CAPS Take 500 mg by mouth daily.   Yes [provider]  POTASSIUM PO Take 99 mg by mouth daily.   Yes [provider]  pravastatin (PRAVACHOL) 40 MG tablet TAKE 1 TABLET BY MOUTH EVERY DAY FOR CHOLESTEROL Patient taking differently: Take 40 mg by mouth at bedtime. 05/17/21  Yes [name], NP  vitamin B-12 (CYANOCOBALAMIN) 1000 MCG tablet Take 1,000 mcg by mouth daily.   Yes [provider]     Family History  Problem Relation Age of Onset   Hyperlipidemia Mother    Hypertension Mother    Colon polyps Mother    Coronary artery disease Father         AMI age 18   Heart disease Father 92       AMI x 2; first AMI in 39s   Breast cancer Sister 53       with metastasis to bone; smoker   Cancer Sister 29       breast cancer   Colon cancer Maternal Uncle        dx. >50   Colon cancer Paternal Aunt        dx. 77s   Heart attack Paternal Uncle    Stroke Maternal Grandmother    Alzheimer's disease Paternal Grandmother    Heart disease Paternal Grandfather    Heart attack Paternal Grandfather    Healthy Daughter    Healthy Daughter    Breast cancer Cousin    Ovarian cancer Cousin        dx. late 70s/dx. 25s   Lung cancer Cousin        dx. 70s   Stomach cancer Other    Esophageal cancer Neg Hx    Rectal cancer Neg Hx     Social History   Socioeconomic History   Marital status: Married    Spouse name: Reniya Mcclees   Number of children: 2   Years of education: Not on file   Highest education level: Not on file  Occupational History   Occupation: retired    Comment: Network engineer  Tobacco Use   Smoking status: Never    Passive exposure: Never   Smokeless tobacco: Never  Vaping Use   Vaping Use: Never used  Substance and Sexual Activity   Alcohol use: Yes     Comment: 2-3 beers per week or wine   Drug use: No   Sexual activity: Yes    Birth control/protection: Surgical  Other Topics Concern   Not on file  Social History Narrative   Marital status:  Married x 14 years; second marriage;happily married,husband has PTSD.     Children: 2 daughters [name], [name]); 7 grandchildren; 2 gg      Lives: with husband, dog/beagle      Employment: retired since 2013;  secretary      Tobacco: never      Alcohol: weekends; 1-2 glasses of wine per week or beer.      Drugs:  None      Exercise: none in 2018      Seatbelt: 100%      Guns: unloaded.     Caffeine not every day. 7 grandchildren and 2 GG. Organ donor NO.      ADLs: independent with ADLs; drives      Advanced Directives: YES; FULL CODE no prolonged measures.   Social Determinants of Health   Financial Resource Strain: Low Risk  (09/29/2021)   Overall Financial Resource Strain (CARDIA)    Difficulty of Paying Living Expenses: Not hard at all  Food Insecurity: No Food Insecurity (09/29/2021)   Hunger Vital Sign    Worried About Running Out of Food in the Last Year: Never true    Ran Out of Food in the Last Year: Never true  Transportation Needs: No Transportation Needs (09/29/2021)   PRAPARE - Hydrologist (Medical): No    Lack of Transportation (Non-Medical): No  Physical Activity: Insufficiently Active (09/29/2021)   Exercise Vital Sign    Days of Exercise per Week: 2 days    Minutes of Exercise per Session: 20 min  Stress: No Stress Concern Present (09/29/2021)   [place]    Feeling of Stress : Only a little  Social Connections: Moderately Integrated (09/29/2021)   Social Connection and Isolation Panel [NHANES]    Frequency of Communication with Friends and Family: More than three times a week    Frequency of Social Gatherings with Friends and Family: Once a week    Attends Religious Services: More  than 4 times per year    Active Member of [employer] or Organizations: No    Attends Archivist Meetings: Never    Marital Status: Married     Review of Systems: A 12 point ROS discussed and pertinent positives are indicated in the HPI above.  All other systems are negative.  Review of Systems  Constitutional:  Positive for fatigue. Negative for fever.  Respiratory:  Negative for cough and shortness of breath.   Cardiovascular:  Negative for chest pain.  Gastrointestinal:  Positive for abdominal pain. Negative for diarrhea, nausea and vomiting.  Psychiatric/Behavioral:  Negative for behavioral problems and confusion.     Vital Signs: BP 107/68 (BP Location: Right Arm)   Pulse 80   Temp 98.8 F (37.1 C) (Oral)   Resp 18   Ht 5' (1.524 m)   Wt 110 lb (49.9 kg)   SpO2 94%   BMI 21.48 kg/m   Physical Exam Vitals and nursing note reviewed.  Constitutional:      General: She is not in acute distress.    Appearance: Normal appearance. She is not ill-appearing.  Cardiovascular:     Rate and Rhythm: Normal rate and regular rhythm.  Pulmonary:     Effort: Pulmonary effort is normal.  Abdominal:     Comments: LLQ drain in place. Insertion site with erythema and irritation.  Foul-smelling drainage.  Gravity bag with feculent-appearing output.   Neurological:     General: No focal deficit present.     Mental Status: She is alert and oriented to person, place, and time. Mental status is at baseline.      MD Evaluation Airway: WNL Heart: WNL Abdomen: WNL Chest/ Lungs: WNL ASA  Classification: 3 Mallampati/Airway Score: Two   Imaging: [place] EKG SITE RITE  Result Date: 12/13/2021 If Site Rite image not attached, placement could not be confirmed due to current cardiac rhythm.  CT ABDOMEN PELVIS W CONTRAST  Result Date: 12/12/2021 CLINICAL DATA:  71 year old female with acute abdominal pain. History of perforated diverticulitis and chronic fistula, chronic pelvic drain.  EXAM: CT ABDOMEN AND PELVIS WITH CONTRAST TECHNIQUE: Multidetector CT imaging of the abdomen and pelvis was performed using the standard protocol following bolus administration of intravenous contrast. RADIATION DOSE REDUCTION: This exam was performed according to the departmental dose-optimization program which includes automated exposure control, adjustment of the mA and/or kV according to patient size and/or use of iterative reconstruction technique. CONTRAST:  46m OMNIPAQUE IOHEXOL 350 MG/ML SOLN COMPARISON:  CT Abdomen and Pelvis 08/11/2021 and earlier. FINDINGS: Lower chest: Lower lung volumes with mild dependent atelectasis now at both lung bases. No pericardial or pleural effusion. Hepatobiliary: Negative liver and gallbladder. Pancreas: Stable and within normal limits. Spleen: Negative. Adrenals/Urinary Tract: Adrenal glands remain within normal limits. New bilateral renal pelvis and collecting system enlargement compatible with mild hydronephrosis. Symmetric renal enhancement and there is contrast excretion on delayed images. Symmetric contrast excretion to the ureters, which remain decompressed and traverse the bowel and mesenteric inflammation at the pelvic inlet to the bladder as seen on series 8, image 52. No significant ureteral distortion is evident. There is evidence of soft tissue thickening and adhesion to the abnormal sigmoid mesentery at the dome of the bladder on sagittal image 63. But otherwise only mild bladder wall thickening and no gas or oral contrast within the bladder to suggest fistula. Stomach/Bowel: Oral contrast has reached the rectum without evidence of obstruction. Chronic percutaneous left lower quadrant pigtail drain terminates in the abnormal sigmoid mesentery, stable since June. Regional moderate sigmoid inflammation and wall thickening which appears mildly progressed compared to the June CT. The catheter is located at the proximal 3rd of the inflamed bowel segment. A small  focus of fluid in the distal sigmoid mesentery has regressed since June with trace residual now visible on coronal image 44. Regional small bowel loops are nondilated and opacified with oral contrast, but multiple loops now appear likely adhesed to the abnormal sigmoid including along the cephalad margin coronal image 23 and caudal margin image 27. No extraluminal gas or contrast identified. Upstream descending colon remarkable for diverticulosis but no active inflammation. Similar transverse and ascending colon diverticulosis with no active inflammation. Terminal ileum appears negative. Appendix not delineated. Stomach is mostly decompressed. There is a relatively large 4.5 cm diverticulum of the proximal duodenum, with no associated inflammation. No free air or free fluid. Vascular/Lymphatic: Major arterial structures in the abdomen and pelvis remain patent with mild for age Aortoiliac calcified atherosclerosis. Portal venous system is patent. No lymphadenopathy identified. Reproductive: Surgically absent uterus as before. Ovaries not well delineated. Other: No pelvic free fluid. Musculoskeletal: Stable visualized osseous structures. IMPRESSION: 1. Complicated Acute on chronic Sigmoid Diverticulitis with progression since a June CT in the form of increasing regional small bowel and bladder dome adhesions to the abnormal sigmoid colon. No associated bowel obstruction. No evidence of acute perforation. No evidence of colovesical fistula at this time. 2. Also there is new mild bilateral hydronephrosis, but symmetric renal contrast excretion and opacified ureters are visible on series 8 tracking to the bladder without no significant ureteral stricture or distortion. 3. Chronic left lower quadrant pelvic drain appears stable. No residual/recurrent abscess. 4. Generalized underlying colon  diverticulosis. Duodenal diverticulum. Electronically Signed   By: Genevie Ann M.D.   On: 12/12/2021 04:38   DG Sinus/Fist Tube Chk-Non  GI  Result Date: 11/24/2021 INDICATION: 71 year old female with indwelling drain secondary to diverticular abscess. She has known fistula with upcoming colonoscopy and possible surgery EXAM: DRAIN INJECTION MEDICATIONS: None ANESTHESIA/SEDATION: None COMPLICATIONS: None immediate. PROCEDURE: Informed written consent was obtained from the patient after a thorough discussion of the procedural risks, benefits and alternatives. All questions were addressed. Clean technique was utilized including caps, mask, gloves, hand hygiene and skin antiseptic. A timeout was performed prior to the initiation of the procedure. Patient was positioned supine under the image intensifier. Scout images acquired. Contrast was injected through the tube. Drain was then reattached to gravity. Patient tolerated the procedure well and remained hemodynamically stable throughout. No complications were encountered and no significant blood loss. FINDINGS: No abscess Persisting fistula Drain well position. IMPRESSION: Drain injection confirms persisting fistula to the colon. Electronically Signed   By: Corrie Mckusick D.O.   On: 11/24/2021 12:48   IR Radiologist Eval & Mgmt  Result Date: 11/24/2021 INDICATION: 71 year old female with indwelling drain secondary to diverticular abscess. She has known fistula with upcoming colonoscopy and possible surgery EXAM: DRAIN INJECTION MEDICATIONS: None ANESTHESIA/SEDATION: None COMPLICATIONS: None immediate. PROCEDURE: Informed written consent was obtained from the patient after a thorough discussion of the procedural risks, benefits and alternatives. All questions were addressed. Clean technique was utilized including caps, mask, gloves, hand hygiene and skin antiseptic. A timeout was performed prior to the initiation of the procedure. Patient was positioned supine under the image intensifier. Scout images acquired. Contrast was injected through the tube. Drain was then reattached to gravity. Patient  tolerated the procedure well and remained hemodynamically stable throughout. No complications were encountered and no significant blood loss. FINDINGS: No abscess Persisting fistula Drain well position. IMPRESSION: Drain injection confirms persisting fistula to the colon. Electronically Signed   By: Corrie Mckusick D.O.   On: 11/24/2021 12:48    Labs:  CBC: Recent Labs    06/25/21 0130 06/26/21 0259 12/11/21 1644 12/11/21 1654 12/13/21 0231  WBC 2.8* 3.4* 6.8  --  5.4  HGB 10.7* 10.6* 12.9 12.6 11.9*  HCT 31.9* 32.3* 38.4 37.0 34.0*  PLT 335 382 316  --  259    COAGS: Recent Labs    06/20/21 1417  INR 1.3*    BMP: Recent Labs    06/26/21 0259 12/11/21 1644 12/11/21 1654 12/13/21 0231 12/14/21 0307  NA 135 137 138 137 137  K 4.1 4.3 4.1 3.7 3.2*  CL 106 100 99 101 103  CO2 24 28  --  27 29  GLUCOSE 114* 113* 112* 102* 147*  BUN <5* 23 26* 9 9  CALCIUM 8.4* 10.2  --  9.6 9.0  CREATININE 0.52 0.66 0.60 0.77 0.63  GFRNONAA >60 >60  --  >60 >60    LIVER FUNCTION TESTS: Recent Labs    06/20/21 1417 06/21/21 0500 12/11/21 1644 12/14/21 0307  BILITOT 0.8 0.8 0.2* 0.7  AST 15 12* 22 13*  ALT '11 9 16 12  '$ ALKPHOS 50 43 54 39  PROT 7.3 6.0* 6.8 6.1*  ALBUMIN 3.3* 2.6* 3.6 2.8*    TUMOR MARKERS: No results for input(s): "AFPTM", "CEA", "CA199", "CHROMGRNA" in the last 8760 hours.  Assessment and Plan: Complicated diverticulitis s/p drain placement 06/21/21 by Dr. Annamaria Boots Patient has been followed closely by IR both as an inpatient and outpatient over the past  several months s/p drain placement.  She was last evaluated in IR 11/24/21 at which time her drain was exchanged.  She currently has a 12Fr drain to gravity drainage. She is now admitted to [facility] with ongoing weakness, failure to improve at home.   Surgery has been consulted and current plan is to optimize for likely inpatient OR.  IR consulted for drain exchange due to increased leakage from her site.   Patient  states leakage has been consistent and is not changed since her most recent drain exchange.  There does appears to be some wear/irritation of the site.   Patient consented for possible exchange, reposition, and/or upsize. She is fearful of the procedure, declining to proceed today due to pain with the most recent drain exchange.  Will make NPO p MN. Proceed with IV pain medication tomorrow if needed.  She is on TPN.    Thank you for this interesting consult.  I greatly enjoyed meeting Lynnsey Barbara and look forward to participating in their care.  A copy of this report was sent to the requesting provider on this date.  Electronically Signed: Docia Barrier, PA 12/14/2021, 2:53 PM   I spent a total of 20 Minutes    in face to face in clinical consultation, greater than 50% of which was counseling/coordinating care for complicated diverticulitis

## 2021-12-14 NOTE — Progress Notes (Signed)
PROGRESS NOTE  Tracy Estrada  XTG:[id number] DOB: May 03, 1950 DOA: 12/11/2021 PCP: [name], NP   Brief Narrative:  Patient is a 71 year old female who presented to the ED with complaint of 2-week history of fatigue, chills, decreased appetite, vomiting.  She was admitted in April and was managed for perforated diverticulitis and was managed nonoperatively and eventually had IR drain placed, and was discharged.  Admitted for further management.  General surgery following.  Found to have acute on chronic sigmoid diverticulitis with perforation, persistent sigmoid colonic fistula.  Started on TPN, full liquid diet.  General surgery planning for Hartman's procedure this week   Assessment & Plan:  Principal Problem:   Sigmoid diverticulitis Active Problems:   Urinary tract infection   Bilateral hydronephrosis   Anxiety and depression   Hyperlipidemia   Acute on chronic sigmoid diverticulitis with perforation/colonic fistula: Status post drain placement by IR.  Was managed conservatively with antibiotics.  Presented with fever, chills, fatigue.  Currently afebrile, no leukocytosis, hemodynamically stable.  CT imaging showed slight progression of inflammatory changes around the sigmoid colon compared to last one.  General surgery following.  Found to have acute on chronic sigmoid diverticulitis with perforation, persistent sigmoid colonic fistula.  Started on TPN, full liquid diet.  General surgery planning for Hartman's procedure this week Currently on Zosyn.  Blood culture  sent. There is a leak around drain site.  IR consulted  Urinary tract infection/bilateral hydronephrosis: UA was positive for leukocytes, nitrates, bacteria.  Patient was also having increased frequency.  CT showed mild bilateral hydronephrosis without signs of stricture or fistula.   urine culture showed Klebsiella which is pansensitive.   Already on Zosyn  Anxiety/ depression: On Wellbutrin  Hyperlipidemia: On  pravastatin   Nutrition Problem: Moderate Malnutrition Etiology: acute illness    DVT prophylaxis:enoxaparin (LOVENOX) injection 40 mg Start: 12/12/21 2200     Code Status: Full Code  Family Communication: family at the bedside  Patient status:Inpatient  Patient is from :Home  Anticipated discharge EV:OJJK  Estimated DC date:Not sure   Consultants: Surgery  Procedures:None  Antimicrobials:  Anti-infectives (From admission, onward)    Start     Dose/Rate Route Frequency Ordered Stop   12/12/21 1400  piperacillin-tazobactam (ZOSYN) IVPB 3.375 g        3.375 g 12.5 mL/hr over 240 Minutes Intravenous Every 8 hours 12/12/21 0738 12/17/21 1359   12/12/21 0745  piperacillin-tazobactam (ZOSYN) IVPB 3.375 g        3.375 g 100 mL/hr over 30 Minutes Intravenous  Once 12/12/21 0740 12/12/21 0922   12/12/21 0300  cefTRIAXone (ROCEPHIN) 1 g in sodium chloride 0.9 % 100 mL IVPB        1 g 200 mL/hr over 30 Minutes Intravenous  Once 12/12/21 0252 12/12/21 0527       Subjective:  Patient seen and examined at the bedside.  Family around bedside too.  Hemodynamically stable overall comfortable.  Denies any worsening abdominal pain, nausea or vomiting.  There is leakage around the abdominal drain site.    Objective: Vitals:   12/13/21 1607 12/13/21 2047 12/14/21 0455 12/14/21 1100  BP: (!) 120/59 (!) 116/92 98/61 107/68  Pulse: 84 89 80   Resp: '18 18 20 18  '$ Temp: 98.3 F (36.8 C) 99.3 F (37.4 C) 98.6 F (37 C) 98.8 F (37.1 C)  TempSrc: Oral Oral Oral Oral  SpO2: 94% 97% 94%   Weight:      Height:  Intake/Output Summary (Last 24 hours) at 12/14/2021 1302 Last data filed at 12/14/2021 0412 Gross per 24 hour  Intake 390.74 ml  Output --  Net 390.74 ml   Filed Weights   12/11/21 1614  Weight: 49.9 kg    Examination:  General exam: Overall comfortable, not in distress HEENT: PERRL Respiratory system:  no wheezes or crackles  Cardiovascular system: S1 &  S2 heard, RRR.  Gastrointestinal system: Abdomen is nondistended, soft and is appropriately tender.  Abdominal drain with surrounding leakage.  Wound covered with dressing Central nervous system: Alert and oriented Extremities: No edema, no clubbing ,no cyanosis, PICC line in the right arm Skin: No rashes, no ulcers,no icterus     Data Reviewed: I have personally reviewed following labs and imaging studies  CBC: Recent Labs  Lab 12/11/21 1644 12/11/21 1654 12/13/21 0231  WBC 6.8  --  5.4  NEUTROABS 4.2  --   --   HGB 12.9 12.6 11.9*  HCT 38.4 37.0 34.0*  MCV 99.2  --  96.3  PLT 316  --  941   Basic Metabolic Panel: Recent Labs  Lab 12/11/21 1644 12/11/21 1654 12/13/21 0227 12/13/21 0231 12/14/21 0307  NA 137 138  --  137 137  K 4.3 4.1  --  3.7 3.2*  CL 100 99  --  101 103  CO2 28  --   --  27 29  GLUCOSE 113* 112*  --  102* 147*  BUN 23 26*  --  9 9  CREATININE 0.66 0.60  --  0.77 0.63  CALCIUM 10.2  --   --  9.6 9.0  MG  --   --  2.1  --  2.2  PHOS  --   --  4.3  --  3.0     Recent Results (from the past 240 hour(s))  Urine Culture     Status: Abnormal   Collection Time: 12/12/21 10:38 AM   Specimen: Urine, Clean Catch  Result Value Ref Range Status   Specimen Description URINE, CLEAN CATCH  Final   Special Requests   Final    NONE Performed at [facility], [place] [address] [place], [place] [address]    Culture >=100,000 COLONIES/mL KLEBSIELLA PNEUMONIAE (A)  Final   Report Status 12/14/2021 FINAL  Final   Organism ID, Bacteria KLEBSIELLA PNEUMONIAE (A)  Final      Susceptibility   Klebsiella pneumoniae - MIC*    AMPICILLIN RESISTANT Resistant     CEFAZOLIN <=4 SENSITIVE Sensitive     CEFEPIME <=0.12 SENSITIVE Sensitive     CEFTRIAXONE <=0.25 SENSITIVE Sensitive     CIPROFLOXACIN <=0.25 SENSITIVE Sensitive     GENTAMICIN <=1 SENSITIVE Sensitive     IMIPENEM <=0.25 SENSITIVE Sensitive     NITROFURANTOIN 64 INTERMEDIATE Intermediate      TRIMETH/SULFA <=20 SENSITIVE Sensitive     AMPICILLIN/SULBACTAM <=2 SENSITIVE Sensitive     PIP/TAZO <=4 SENSITIVE Sensitive     * >=100,000 COLONIES/mL KLEBSIELLA PNEUMONIAE     Radiology Studies: [place] EKG SITE RITE  Result Date: 12/13/2021 If Site Rite image not attached, placement could not be confirmed due to current cardiac rhythm.   Scheduled Meds:  buPROPion  300 mg Oral Daily   busPIRone  5 mg Oral BID   Chlorhexidine Gluconate Cloth  6 each Topical Daily   enoxaparin (LOVENOX) injection  40 mg Subcutaneous Q24H   feeding supplement  237 mL Oral TID WC   insulin aspart  0-9  Units Subcutaneous Q6H   lip balm   Topical BID   pantoprazole (PROTONIX) IV  40 mg Intravenous QHS   sodium chloride flush  10-40 mL Intracatheter Q12H   sodium chloride flush  3 mL Intravenous Q12H   Continuous Infusions:  ondansetron (ZOFRAN) IV     piperacillin-tazobactam (ZOSYN)  IV 3.375 g (12/14/21 0556)   TPN ADULT (ION) 35 mL/hr at 12/14/21 0412   TPN ADULT (ION)       LOS: 2 days   Shelly Coss, MD [facility] P10/11/2021, 1:02 PM

## 2021-12-15 ENCOUNTER — Telehealth: Payer: Self-pay

## 2021-12-15 ENCOUNTER — Telehealth: Payer: Medicare Other | Admitting: Primary Care

## 2021-12-15 ENCOUNTER — Inpatient Hospital Stay (HOSPITAL_COMMUNITY): Payer: Medicare Other

## 2021-12-15 DIAGNOSIS — K5732 Diverticulitis of large intestine without perforation or abscess without bleeding: Secondary | ICD-10-CM | POA: Diagnosis not present

## 2021-12-15 HISTORY — PX: IR CATHETER TUBE CHANGE: IMG717

## 2021-12-15 LAB — BASIC METABOLIC PANEL
Anion gap: 7 (ref 5–15)
BUN: 10 mg/dL (ref 8–23)
CO2: 29 mmol/L (ref 22–32)
Calcium: 9.2 mg/dL (ref 8.9–10.3)
Chloride: 100 mmol/L (ref 98–111)
Creatinine, Ser: 0.54 mg/dL (ref 0.44–1.00)
GFR, Estimated: >60 mL/min (ref >60)
Glucose, Bld: 122 mg/dL — ABNORMAL HIGH (ref 70–99)
Potassium: 4 mmol/L (ref 3.5–5.1)
Sodium: 136 mmol/L (ref 135–145)

## 2021-12-15 LAB — GLUCOSE, CAPILLARY
Glucose-Capillary: 120 mg/dL — ABNORMAL HIGH (ref 70–99)
Glucose-Capillary: 130 mg/dL — ABNORMAL HIGH (ref 70–99)
Glucose-Capillary: 135 mg/dL — ABNORMAL HIGH (ref 70–99)
Glucose-Capillary: 139 mg/dL — ABNORMAL HIGH (ref 70–99)
Glucose-Capillary: 150 mg/dL — ABNORMAL HIGH (ref 70–99)
Glucose-Capillary: 93 mg/dL (ref 70–99)

## 2021-12-15 LAB — CBC
HCT: 33.3 % — ABNORMAL LOW (ref 36.0–46.0)
Hemoglobin: 11 g/dL — ABNORMAL LOW (ref 12.0–15.0)
MCH: 32.7 pg (ref 26.0–34.0)
MCHC: 33 g/dL (ref 30.0–36.0)
MCV: 99.1 fL (ref 80.0–100.0)
Platelets: 234 10*3/uL (ref 150–400)
RBC: 3.36 MIL/uL — ABNORMAL LOW (ref 3.87–5.11)
RDW: 11.6 % (ref 11.5–15.5)
WBC: 5 10*3/uL (ref 4.0–10.5)
nRBC: 0 % (ref 0.0–0.2)

## 2021-12-15 LAB — MAGNESIUM: Magnesium: 2.1 mg/dL (ref 1.7–2.4)

## 2021-12-15 LAB — PHOSPHORUS: Phosphorus: 3 mg/dL (ref 2.5–4.6)

## 2021-12-15 MED ORDER — IOHEXOL 300 MG/ML  SOLN
50.0000 mL | Freq: Once | INTRAMUSCULAR | Status: AC | PRN
  Administered 2021-12-15: 10 mL

## 2021-12-15 MED ORDER — SALINE SPRAY 0.65 % NA SOLN
1.0000 | NASAL | Status: DC | PRN
  Filled 2021-12-15: qty 44

## 2021-12-15 MED ORDER — TRACE MINERALS CU-MN-SE-ZN 300-55-60-3000 MCG/ML IV SOLN
INTRAVENOUS | Status: AC
  Filled 2021-12-15: qty 474.87

## 2021-12-15 MED ORDER — FENTANYL CITRATE (PF) 100 MCG/2ML IJ SOLN
INTRAMUSCULAR | Status: AC
  Filled 2021-12-15: qty 2

## 2021-12-15 MED ORDER — FENTANYL CITRATE (PF) 100 MCG/2ML IJ SOLN
INTRAMUSCULAR | Status: AC | PRN
  Administered 2021-12-15: 25 ug via INTRAVENOUS

## 2021-12-15 MED ORDER — MIDAZOLAM HCL 2 MG/2ML IJ SOLN
INTRAMUSCULAR | Status: AC | PRN
  Administered 2021-12-15 (×2): .5 mg via INTRAVENOUS

## 2021-12-15 MED ORDER — LIDOCAINE HCL 1 % IJ SOLN
INTRAMUSCULAR | Status: AC
  Administered 2021-12-15: 10 mL
  Filled 2021-12-15: qty 20

## 2021-12-15 MED ORDER — MIDAZOLAM HCL 2 MG/2ML IJ SOLN
INTRAMUSCULAR | Status: AC
  Filled 2021-12-15: qty 2

## 2021-12-15 MED ORDER — POTASSIUM CHLORIDE 10 MEQ/100ML IV SOLN
10.0000 meq | Freq: Once | INTRAVENOUS | Status: AC
  Administered 2021-12-15: 10 meq via INTRAVENOUS
  Filled 2021-12-15: qty 100

## 2021-12-15 NOTE — Progress Notes (Signed)
PROGRESS NOTE  Tracy Estrada  WOE:[id number] DOB: [birth year] DOA: 12/11/2021 PCP: [name], NP   Brief Narrative:  Patient is a 71 year old female who presented to the ED with complaint of 2-week history of fatigue, chills, decreased appetite, vomiting.  She was admitted in April and was managed for perforated diverticulitis and was managed nonoperatively and eventually had IR drain placed, and was discharged.  Admitted for further management.  General surgery following.  Found to have acute on chronic sigmoid diverticulitis with perforation, persistent sigmoid colonic fistula.  Started on TPN, full liquid diet.  General surgery planning for Hartman's procedure this week   Assessment & Plan:  Principal Problem:   Sigmoid diverticulitis Active Problems:   Urinary tract infection   Bilateral hydronephrosis   Anxiety and depression   Hyperlipidemia   Acute on chronic sigmoid diverticulitis with perforation/colonic fistula: Status post drain placement by IR.  Was managed conservatively with antibiotics.  Presented with fever, chills, fatigue.  Currently afebrile, no leukocytosis, hemodynamically stable.  CT imaging showed slight progression of inflammatory changes around the sigmoid colon compared to last one.  General surgery following.  Found to have acute on chronic sigmoid diverticulitis with perforation, persistent sigmoid colonic fistula.  Started on TPN, full liquid diet.  General surgery planning for Hartman's procedure this week Currently on Zosyn.  Blood culture  sent.  No growth till date There is a leak around drain site.  IR consulted, plan for drain exchange today  Urinary tract infection/bilateral hydronephrosis: UA was positive for leukocytes, nitrates, bacteria.  Patient was also having increased frequency.  CT showed mild bilateral hydronephrosis without signs of stricture or fistula.   urine culture showed Klebsiella which is pansensitive.   Already on  Zosyn  Anxiety/ depression: On Wellbutrin  Hyperlipidemia: On pravastatin   Nutrition Problem: Moderate Malnutrition Etiology: acute illness    DVT prophylaxis:enoxaparin (LOVENOX) injection 40 mg Start: 12/12/21 2200     Code Status: Full Code  Family Communication: family at the bedside  Patient status:Inpatient  Patient is from :Home  Anticipated discharge OI:BBCW  Estimated DC date:Not sure   Consultants: Surgery  Procedures:None  Antimicrobials:  Anti-infectives (From admission, onward)    Start     Dose/Rate Route Frequency Ordered Stop   12/12/21 1400  piperacillin-tazobactam (ZOSYN) IVPB 3.375 g        3.375 g 12.5 mL/hr over 240 Minutes Intravenous Every 8 hours 12/12/21 0738 12/17/21 1359   12/12/21 0745  piperacillin-tazobactam (ZOSYN) IVPB 3.375 g        3.375 g 100 mL/hr over 30 Minutes Intravenous  Once 12/12/21 0740 12/12/21 0922   12/12/21 0300  cefTRIAXone (ROCEPHIN) 1 g in sodium chloride 0.9 % 100 mL IVPB        1 g 200 mL/hr over 30 Minutes Intravenous  Once 12/12/21 0252 12/12/21 0527       Subjective:  Patient seen and examined the bedside today.  Hemodynamically stable.  Overall comfortable, febrile.  Any worsening of abdominal pain but frustrated due to her situation   Objective: Vitals:   12/14/21 1100 12/14/21 2135 12/15/21 0424 12/15/21 0745  BP: 107/68 125/62 119/62 113/63  Pulse:    87  Resp: 18 (!) 21 (!) 23 17  Temp: 98.8 F (37.1 C) 98.6 F (37 C)  98.5 F (36.9 C)  TempSrc: Oral Oral  Oral  SpO2:  98% 95% 94%  Weight:      Height:        Intake/Output Summary (  Last 24 hours) at 12/15/2021 1156 Last data filed at 12/15/2021 0500 Gross per 24 hour  Intake 750 ml  Output --  Net 750 ml   Filed Weights   12/11/21 1614  Weight: 49.9 kg    Examination:   General exam: Overall comfortable, not in distress HEENT: PERRL Respiratory system:  no wheezes or crackles  Cardiovascular system: S1 & S2 heard, RRR.   Gastrointestinal system: Abdomen is nondistended, soft and mostly nontender.  Abdominal drain with surrounding leakage, wound covered with dressing Central nervous system: Alert and oriented Extremities: No edema, no clubbing ,no cyanosis,picc line on right arm Skin: No rashes, no ulcers,no icterus     Data Reviewed: I have personally reviewed following labs and imaging studies  CBC: Recent Labs  Lab 12/11/21 1644 12/11/21 1654 12/13/21 0231 12/15/21 0239  WBC 6.8  --  5.4 5.0  NEUTROABS 4.2  --   --   --   HGB 12.9 12.6 11.9* 11.0*  HCT 38.4 37.0 34.0* 33.3*  MCV 99.2  --  96.3 99.1  PLT 316  --  259 001   Basic Metabolic Panel: Recent Labs  Lab 12/11/21 1644 12/11/21 1654 12/13/21 0227 12/13/21 0231 12/14/21 0307 12/15/21 0239  NA 137 138  --  137 137 136  K 4.3 4.1  --  3.7 3.2* 4.0  CL 100 99  --  101 103 100  CO2 28  --   --  '27 29 29  '$ GLUCOSE 113* 112*  --  102* 147* 122*  BUN 23 26*  --  '9 9 10  '$ CREATININE 0.66 0.60  --  0.77 0.63 0.54  CALCIUM 10.2  --   --  9.6 9.0 9.2  MG  --   --  2.1  --  2.2 2.1  PHOS  --   --  4.3  --  3.0 3.0     Recent Results (from the past 240 hour(s))  Urine Culture     Status: Abnormal   Collection Time: 12/12/21 10:38 AM   Specimen: Urine, Clean Catch  Result Value Ref Range Status   Specimen Description URINE, CLEAN CATCH  Final   Special Requests   Final    NONE Performed at [facility], [place] [address] [place], [place] [address]    Culture >=100,000 COLONIES/mL KLEBSIELLA PNEUMONIAE (A)  Final   Report Status 12/14/2021 FINAL  Final   Organism ID, Bacteria KLEBSIELLA PNEUMONIAE (A)  Final      Susceptibility   Klebsiella pneumoniae - MIC*    AMPICILLIN RESISTANT Resistant     CEFAZOLIN <=4 SENSITIVE Sensitive     CEFEPIME <=0.12 SENSITIVE Sensitive     CEFTRIAXONE <=0.25 SENSITIVE Sensitive     CIPROFLOXACIN <=0.25 SENSITIVE Sensitive     GENTAMICIN <=1 SENSITIVE Sensitive     IMIPENEM <=0.25 SENSITIVE  Sensitive     NITROFURANTOIN 64 INTERMEDIATE Intermediate     TRIMETH/SULFA <=20 SENSITIVE Sensitive     AMPICILLIN/SULBACTAM <=2 SENSITIVE Sensitive     PIP/TAZO <=4 SENSITIVE Sensitive     * >=100,000 COLONIES/mL KLEBSIELLA PNEUMONIAE  Culture, blood (Routine X 2) w Reflex to ID Panel     Status: None (Preliminary result)   Collection Time: 12/13/21  8:33 AM   Specimen: BLOOD  Result Value Ref Range Status   Specimen Description BLOOD BLOOD RIGHT HAND  Final   Special Requests   Final    BOTTLES DRAWN AEROBIC AND ANAEROBIC Blood Culture results may not be  optimal due to an inadequate volume of blood received in culture bottles   Culture   Final    NO GROWTH 1 DAY Performed at [facility], [place] [address], [place], [place] [address]    Report Status PENDING  Incomplete  Culture, blood (Routine X 2) w Reflex to ID Panel     Status: None (Preliminary result)   Collection Time: 12/13/21  8:33 AM   Specimen: BLOOD  Result Value Ref Range Status   Specimen Description BLOOD BLOOD RIGHT HAND  Final   Special Requests   Final    BOTTLES DRAWN AEROBIC AND ANAEROBIC Blood Culture results may not be optimal due to an inadequate volume of blood received in culture bottles   Culture   Final    NO GROWTH 1 DAY Performed at [facility], [place] [address], [place], [place] [address]    Report Status PENDING  Incomplete     Radiology Studies: No results found.  Scheduled Meds:  buPROPion  300 mg Oral Daily   busPIRone  5 mg Oral BID   Chlorhexidine Gluconate Cloth  6 each Topical Daily   enoxaparin (LOVENOX) injection  40 mg Subcutaneous Q24H   feeding supplement  237 mL Oral TID WC   insulin aspart  0-9 Units Subcutaneous Q6H   lip balm   Topical BID   pantoprazole (PROTONIX) IV  40 mg Intravenous QHS   sodium chloride flush  10-40 mL Intracatheter Q12H   sodium chloride flush  3 mL Intravenous Q12H   Continuous Infusions:  ondansetron (ZOFRAN) IV      piperacillin-tazobactam (ZOSYN)  IV 3.375 g (12/15/21 0801)   TPN ADULT (ION) 35 mL/hr at 12/14/21 1728   TPN ADULT (ION)       LOS: 3 days   Shelly Coss, MD [facility] P10/12/2021, 11:56 AM

## 2021-12-15 NOTE — Progress Notes (Signed)
Mobility Specialist Progress Note   12/15/21 1133  Mobility  Activity Stood at bedside;Ambulated with assistance in room  Level of Assistance Minimal assist, patient does 75% or more  Assistive Device Front wheel walker  Distance Ambulated (ft) 20 ft  Activity Response Tolerated well  $Mobility charge 1 Mobility   Received pt in chair c/o nausea but agreeable to mobility. MinA to get pt EOB d/t trunk weakness but able to ambulate short bout in room w/o fault and at minG. X10  STS w/o AD and no LOB recorded. Pt requesting to get back to bed d/t increase in nausea. Placed in bed w/ all needs met and call bell in reach.  Holland Falling Mobility Specialist MS [facility] #:  [phone] Acute Rehab Office:  [phone]'

## 2021-12-15 NOTE — Telephone Encounter (Signed)
Spke with patient daughter and informed her to have patient schedule a hospital f/u when she is discharged.

## 2021-12-15 NOTE — Care Management Important Message (Signed)
Important Message  Patient Details  Name: Tracy Estrada MRN: [id number] Date of Birth: 1950-03-27   Medicare Important Message Given:  Yes     Hannah Beat 12/15/2021, 10:55 AM

## 2021-12-15 NOTE — Progress Notes (Addendum)
[facility] Progress Note     Subjective: CC:  NAEO. Didn't drink any boost yesterday. Going for IR drain replacement today  Objective: Vital signs in last 24 hours: Temp:  [98.5 F (36.9 C)-98.6 F (37 C)] 98.5 F (36.9 C) (10/10 0745) Pulse Rate:  [87] 87 (10/10 0745) Resp:  [17-23] 17 (10/10 0745) BP: (113-125)/(62-63) 113/63 (10/10 0745) SpO2:  [94 %-98 %] 94 % (10/10 0745) Last BM Date : 12/14/21  Intake/Output from previous day: 10/09 0701 - 10/10 0700 In: 750 [I.V.:350; IV Piggyback:400] Out: -  Intake/Output this shift: No intake/output data recorded.  PE: Gen:  Alert, NAD, pleasant Card:  Regular rate and rhythm, pedal pulses 2+ BL Pulm:  Normal effort, clear to auscultation bilaterally Abd: Soft, non-tender, non-distended, drain continues to have feculent output, mostly around the drain. Skin: warm and dry, no rashes  Psych: A&Ox3   Lab Results:  Recent Labs    12/13/21 0231 12/15/21 0239  WBC 5.4 5.0  HGB 11.9* 11.0*  HCT 34.0* 33.3*  PLT 259 234   BMET Recent Labs    12/14/21 0307 12/15/21 0239  NA 137 136  K 3.2* 4.0  CL 103 100  CO2 29 29  GLUCOSE 147* 122*  BUN 9 10  CREATININE 0.63 0.54  CALCIUM 9.0 9.2   PT/INR No results for input(s): "LABPROT", "INR" in the last 72 hours. CMP     Component Value Date/Time   NA 136 12/15/2021 0239   NA 141 04/07/2016 1348   K 4.0 12/15/2021 0239   CL 100 12/15/2021 0239   CO2 29 12/15/2021 0239   GLUCOSE 122 (H) 12/15/2021 0239   BUN 10 12/15/2021 0239   BUN 16 04/07/2016 1348   CREATININE 0.54 12/15/2021 0239   CREATININE 0.62 09/16/2015 1245   CALCIUM 9.2 12/15/2021 0239   PROT 6.1 (L) 12/14/2021 0307   PROT 6.7 04/07/2016 1348   ALBUMIN 2.8 (L) 12/14/2021 0307   ALBUMIN 4.5 04/07/2016 1348   AST 13 (L) 12/14/2021 0307   ALT 12 12/14/2021 0307   ALKPHOS 39 12/14/2021 0307   BILITOT 0.7 12/14/2021 0307   BILITOT 0.4 04/07/2016 1348   GFRNONAA >60 12/15/2021 0239    GFRAA >60 06/03/2016 1101   Lipase     Component Value Date/Time   LIPASE 23 12/11/2021 1644     Studies/Results: No results found.  Anti-infectives: Anti-infectives (From admission, onward)    Start     Dose/Rate Route Frequency Ordered Stop   12/12/21 1400  piperacillin-tazobactam (ZOSYN) IVPB 3.375 g        3.375 g 12.5 mL/hr over 240 Minutes Intravenous Every 8 hours 12/12/21 0738 12/17/21 1359   12/12/21 0745  piperacillin-tazobactam (ZOSYN) IVPB 3.375 g        3.375 g 100 mL/hr over 30 Minutes Intravenous  Once 12/12/21 0740 12/12/21 0922   12/12/21 0300  cefTRIAXone (ROCEPHIN) 1 g in sodium chloride 0.9 % 100 mL IVPB        1 g 200 mL/hr over 30 Minutes Intravenous  Once 12/12/21 0252 12/12/21 0527        Assessment/Plan Acute on chronic sigmoid diverticulitis with perforation, colonic fistula with IR drain - Afebrile, VSS, WBC WNL - No emergent surgical needs.  - Continue IV antibiotics - . She appears non-toxic but, given weeks of decreased appetite, decreased PO intake, fatigue, subjective chills, abdominal discomfort, I think she has failed outpatient management and needs an operation this admission which would be exploratory  laparotomy, partial colectomy, and end colostomy. WOC RN marked her abdomen yesterday. Recheck nutritional labs tomorrow 10/11 and plan for surgery later this week.  FEN: FLD, Ensure or Boost BID or TID. TPN. Repeat pre-albumin tomorrow ID: Zosyn, UA with UTI, culture pending. UTI asymptomatic. Dispo: Continue IV abx, FLD, TPN.     LOS: 3 days   I reviewed last 24 h vitals and pain scores, last 48 h intake and output, last 24 h labs and trends, and last 24 h imaging results.  This care required moderate level of medical decision making.    Obie Dredge, PA-C [facility] Please see Amion for pager number during day hours 7:00am-4:30pm

## 2021-12-15 NOTE — Progress Notes (Signed)
PHARMACY - TOTAL PARENTERAL NUTRITION CONSULT NOTE   Indication:  complicated diverticulitis  Patient Measurements: Height: 5' (152.4 cm) Weight: 49.9 kg (110 lb) IBW/kg (Calculated) : 45.5 TPN AdjBW (KG): 49.9 Body mass index is 21.48 kg/m. Usual Weight: 45 kg  Assessment:  71 yo F admitted on 10/7 who presented with weakness and 8lb weight loss over 2 weeks. Patient been eating "poorly" x~86monthat home with limited toleration d/t diverticulitis. (Has cutaneous drain in place since April 2023 for diverticulitis w/ abscess).   Glucose / Insulin: CBG<180, 1 unit/24h SSI required, A1c 5.4% Electrolytes:  coCa 10.2; all others WNL  Renal: Scr <1; BUN WNL Hepatic: Alb 2.8, LFTs WNL  Intake / Output; MIVF: UOP: 4x/24h, LBM 10/9 (x2) GI Imaging: 10/7 CTAP colon diverticulosis; no recurrent abscess; complicated acute on chronic sigmoid diverticulitis with progression since June GI Surgeries / Procedures:  Planning for GI operation- Hartman's procedure this week, pending repeat pre-albumin level   Central access: PICC (double-lumen) placed 10/8 TPN start date: 10/8  Nutritional Goals: Goal TPN rate is 70 mL/hr (provides 94 g of protein and 1792 kcals per day)  RD Assessment: Estimated Needs Total Energy Estimated Needs: 1600-1800 Total Protein Estimated Needs: 80-95 grams Total Fluid Estimated Needs: >/= 1.6 L  Current Nutrition:  TPN + NPO   Note: Pt does not like Ensure, but takes home Boost High Protein TID - reports only 1 Boost on 10/9 due to NPO status for procedure (250kcal, 20g protein per carton)  Plan:  Continue TPN to 53 mL/hr at 1800 - providing 71g protein and 1358kcal which provides ~75% of patient's kcal needs due to high risk of refeeding syndrome Electrolytes in TPN: Na 448m/L, K 5529mL, Ca 3mE59m, Mg 7mEq41m and Phos 26mmo48m Max Cl. Add standard MVI and trace elements to TPN Add thiamine '100mg'$  to TPN daily x7 days for high risk of re-feeding  syndrome Continue Sensitive SSI frequency to q6h and adjust as needed, consider discontinuing once at goal TPN Monitor TPN labs on Mon/Thurs, daily TPN labs until stable   Ohm Dentler Luisa HartmD, BCPS Clinical Pharmacist 12/15/2021 11:27 AM   Please refer to AMION for pharmacy phone number;a

## 2021-12-15 NOTE — Telephone Encounter (Signed)
I wanted to follow up on this patient and Prolia. She had to cancel her last appointment in September and the note said she discussed this with you. She is in the hospital currently. With her current health condition issues how long should she post pone this for in your opinion?

## 2021-12-15 NOTE — Procedures (Signed)
  Procedure:  LLQ abscess drain catheter exchange for 40F device, fistula to colon still present, placed to bulb suction Preprocedure diagnosis: The primary encounter diagnosis was Generalized weakness. Diagnoses of Acute lower UTI and Diverticulitis were also pertinent to this visit.  Postprocedure diagnosis: same EBL:    minimal Complications:   none immediate  See full dictation in [facility].  Dillard Cannon MD Main # [phone] Pager  [phone] Mobile [phone]

## 2021-12-15 NOTE — Telephone Encounter (Signed)
Check back with me in 1 month.

## 2021-12-16 DIAGNOSIS — K5732 Diverticulitis of large intestine without perforation or abscess without bleeding: Secondary | ICD-10-CM | POA: Diagnosis not present

## 2021-12-16 LAB — BASIC METABOLIC PANEL
Anion gap: 7 (ref 5–15)
BUN: 14 mg/dL (ref 8–23)
CO2: 29 mmol/L (ref 22–32)
Calcium: 9.4 mg/dL (ref 8.9–10.3)
Chloride: 101 mmol/L (ref 98–111)
Creatinine, Ser: 0.63 mg/dL (ref 0.44–1.00)
GFR, Estimated: >60 mL/min (ref >60)
Glucose, Bld: 135 mg/dL — ABNORMAL HIGH (ref 70–99)
Potassium: 4.3 mmol/L (ref 3.5–5.1)
Sodium: 137 mmol/L (ref 135–145)

## 2021-12-16 LAB — GLUCOSE, CAPILLARY
Glucose-Capillary: 120 mg/dL — ABNORMAL HIGH (ref 70–99)
Glucose-Capillary: 122 mg/dL — ABNORMAL HIGH (ref 70–99)
Glucose-Capillary: 128 mg/dL — ABNORMAL HIGH (ref 70–99)
Glucose-Capillary: 145 mg/dL — ABNORMAL HIGH (ref 70–99)
Glucose-Capillary: 175 mg/dL — ABNORMAL HIGH (ref 70–99)
Glucose-Capillary: 183 mg/dL — ABNORMAL HIGH (ref 70–99)

## 2021-12-16 LAB — MAGNESIUM: Magnesium: 2.3 mg/dL (ref 1.7–2.4)

## 2021-12-16 LAB — PREALBUMIN: Prealbumin: 7 mg/dL — ABNORMAL LOW (ref 18–38)

## 2021-12-16 LAB — PHOSPHORUS: Phosphorus: 3.6 mg/dL (ref 2.5–4.6)

## 2021-12-16 MED ORDER — INSULIN ASPART 100 UNIT/ML IJ SOLN
0.0000 [IU] | Freq: Four times a day (QID) | INTRAMUSCULAR | Status: DC
  Administered 2021-12-17: 1 [IU] via SUBCUTANEOUS

## 2021-12-16 MED ORDER — TRACE MINERALS CU-MN-SE-ZN 300-55-60-3000 MCG/ML IV SOLN
INTRAVENOUS | Status: AC
  Filled 2021-12-16: qty 627.2

## 2021-12-16 NOTE — Telephone Encounter (Signed)
noted 

## 2021-12-16 NOTE — Progress Notes (Signed)
PROGRESS NOTE    Tracy Estrada  BOF:[id number] DOB: [birth year] DOA: 12/11/2021 PCP: [name], NP   Brief Narrative:  Patient is a 71 year old female who presented to the ED with complaint of 2-week history of fatigue, chills, decreased appetite, vomiting.  She was admitted in April and was managed for perforated diverticulitis and was managed nonoperatively and eventually had IR drain placed, and was discharged.  Admitted for further management.  General surgery following.  Found to have acute on chronic sigmoid diverticulitis with perforation, persistent sigmoid colonic fistula.  Started on TPN, full liquid diet.  General surgery planning for Hartman's procedure this week.  Assessment & Plan:   Principal Problem:   Sigmoid diverticulitis Active Problems:   Urinary tract infection   Bilateral hydronephrosis   Anxiety and depression   Hyperlipidemia  Acute on chronic sigmoid diverticulitis with perforation/colonic fistula: Status post drain placement by IR.  Was managed conservatively with antibiotics.  Presented with fever, chills, fatigue.  Currently afebrile, no leukocytosis, hemodynamically stable.  CT imaging showed slight progression of inflammatory changes around the sigmoid colon compared to last one.  General surgery following.  Found to have acute on chronic sigmoid diverticulitis with perforation, persistent sigmoid colonic fistula.  Started on TPN, full liquid diet.  Underwent drain exchange by radiology on 12/15/2021.  General surgery planning for Hartman's procedure this week Currently on Zosyn.  Blood culture  sent.  No growth till date.   Urinary tract infection/bilateral hydronephrosis: UA was positive for leukocytes, nitrates, bacteria.  Patient was also having increased frequency.  CT showed mild bilateral hydronephrosis without signs of stricture or fistula.   urine culture showed Klebsiella which is pansensitive.   Already on Zosyn   Anxiety/ depression: On  Wellbutrin   Hyperlipidemia: On pravastatin   Nutrition Problem: Moderate Malnutrition Etiology: acute illness  DVT prophylaxis: enoxaparin (LOVENOX) injection 40 mg Start: 12/12/21 2200   Code Status: Full Code  Family Communication:  None present at bedside.  Plan of care discussed with patient in length and he/she verbalized understanding and agreed with it.  Status is: Inpatient Remains inpatient appropriate because: General surgery planning for surgery later this week.   Estimated body mass index is 21.48 kg/m as calculated from the following:   Height as of this encounter: 5' (1.524 m).   Weight as of this encounter: 49.9 kg.    Nutritional Assessment: Body mass index is 21.48 kg/m.Marland Kitchen Seen by dietician.  I agree with the assessment and plan as outlined below: Nutrition Status: Nutrition Problem: Moderate Malnutrition Etiology: acute illness Signs/Symptoms: energy intake < 75% for > 7 days, percent weight loss, mild fat depletion, mild muscle depletion (5% wt loss in 2 weeks) Percent weight loss: 5 % Interventions: TPN, Ensure Enlive (each supplement provides 350kcal and 20 grams of protein), MVI  . Skin Assessment: I have examined the patient's skin and I agree with the wound assessment as performed by the wound care RN as outlined below:    Consultants:  IR General surgery  Procedures:  As above  Antimicrobials:  Anti-infectives (From admission, onward)    Start     Dose/Rate Route Frequency Ordered Stop   12/12/21 1400  piperacillin-tazobactam (ZOSYN) IVPB 3.375 g        3.375 g 12.5 mL/hr over 240 Minutes Intravenous Every 8 hours 12/12/21 0738 12/17/21 1359   12/12/21 0745  piperacillin-tazobactam (ZOSYN) IVPB 3.375 g        3.375 g 100 mL/hr over 30 Minutes Intravenous  Once 12/12/21 0740 12/12/21 0922   12/12/21 0300  cefTRIAXone (ROCEPHIN) 1 g in sodium chloride 0.9 % 100 mL IVPB        1 g 200 mL/hr over 30 Minutes Intravenous  Once 12/12/21 0252  12/12/21 0527         Subjective: Patient seen and examined.  She has mild pain at the left lower quadrant due to drain exchange yesterday.  Otherwise feeling better and no new complaint.  Objective: Vitals:   12/15/21 1718 12/15/21 1951 12/16/21 0441 12/16/21 0931  BP: 120/66 127/75 (!) 118/59 123/70  Pulse: 92 99 96 94  Resp: '15 16 17 18  '$ Temp: 98.4 F (36.9 C) 98.2 F (36.8 C)  98 F (36.7 C)  TempSrc: Oral Oral    SpO2: 98% 98% 95% 98%  Weight:      Height:        Intake/Output Summary (Last 24 hours) at 12/16/2021 0954 Last data filed at 12/16/2021 0606 Gross per 24 hour  Intake 269.39 ml  Output 50 ml  Net 219.39 ml   Filed Weights   12/11/21 1614  Weight: 49.9 kg    Examination:  General exam: Appears calm and comfortable  Respiratory system: Clear to auscultation. Respiratory effort normal. Cardiovascular system: S1 & S2 heard, RRR. No JVD, murmurs, rubs, gallops or clicks. No pedal edema. Gastrointestinal system: Abdomen is nondistended, soft and slightly tender at left lower quadrant, drain in place at left lower quadrant, no organomegaly or masses felt. Normal bowel sounds heard. Central nervous system: Alert and oriented. No focal neurological deficits. Extremities: Symmetric 5 x 5 power. Skin: No rashes, lesions or ulcers Psychiatry: Judgement and insight appear normal. Mood & affect appropriate.    Data Reviewed: I have personally reviewed following labs and imaging studies  CBC: Recent Labs  Lab 12/11/21 1644 12/11/21 1654 12/13/21 0231 12/15/21 0239  WBC 6.8  --  5.4 5.0  NEUTROABS 4.2  --   --   --   HGB 12.9 12.6 11.9* 11.0*  HCT 38.4 37.0 34.0* 33.3*  MCV 99.2  --  96.3 99.1  PLT 316  --  259 403   Basic Metabolic Panel: Recent Labs  Lab 12/11/21 1644 12/11/21 1654 12/13/21 0227 12/13/21 0231 12/14/21 0307 12/15/21 0239 12/16/21 0413  NA 137 138  --  137 137 136 137  K 4.3 4.1  --  3.7 3.2* 4.0 4.3  CL 100 99  --  101 103  100 101  CO2 28  --   --  '27 29 29 29  '$ GLUCOSE 113* 112*  --  102* 147* 122* 135*  BUN 23 26*  --  '9 9 10 14  '$ CREATININE 0.66 0.60  --  0.77 0.63 0.54 0.63  CALCIUM 10.2  --   --  9.6 9.0 9.2 9.4  MG  --   --  2.1  --  2.2 2.1 2.3  PHOS  --   --  4.3  --  3.0 3.0 3.6   GFR: Estimated Creatinine Clearance: 45 mL/min (by C-G formula based on SCr of 0.63 mg/dL). Liver Function Tests: Recent Labs  Lab 12/11/21 1644 12/14/21 0307  AST 22 13*  ALT 16 12  ALKPHOS 54 39  BILITOT 0.2* 0.7  PROT 6.8 6.1*  ALBUMIN 3.6 2.8*   Recent Labs  Lab 12/11/21 1644  LIPASE 23   No results for input(s): "AMMONIA" in the last 168 hours. Coagulation Profile: No results for input(s): "INR", "PROTIME" in  the last 168 hours. Cardiac Enzymes: No results for input(s): "CKTOTAL", "CKMB", "CKMBINDEX", "TROPONINI" in the last 168 hours. BNP (last 3 results) No results for input(s): "PROBNP" in the last 8760 hours. HbA1C: Recent Labs    12/14/21 0307  HGBA1C 5.4   CBG: Recent Labs  Lab 12/15/21 1816 12/15/21 1948 12/16/21 0020 12/16/21 0440 12/16/21 0723  GLUCAP 139* 120* 175* 145* 122*   Lipid Profile: Recent Labs    12/14/21 0307  TRIG 87   Thyroid Function Tests: No results for input(s): "TSH", "T4TOTAL", "FREET4", "T3FREE", "THYROIDAB" in the last 72 hours. Anemia Panel: No results for input(s): "VITAMINB12", "FOLATE", "FERRITIN", "TIBC", "IRON", "RETICCTPCT" in the last 72 hours. Sepsis Labs: No results for input(s): "PROCALCITON", "LATICACIDVEN" in the last 168 hours.  Recent Results (from the past 240 hour(s))  Urine Culture     Status: Abnormal   Collection Time: 12/12/21 10:38 AM   Specimen: Urine, Clean Catch  Result Value Ref Range Status   Specimen Description URINE, CLEAN CATCH  Final   Special Requests   Final    NONE Performed at [facility], [address] [place], [place] [address]    Culture >=100,000 COLONIES/mL KLEBSIELLA PNEUMONIAE (A)  Final    Report Status 12/14/2021 FINAL  Final   Organism ID, Bacteria KLEBSIELLA PNEUMONIAE (A)  Final      Susceptibility   Klebsiella pneumoniae - MIC*    AMPICILLIN RESISTANT Resistant     CEFAZOLIN <=4 SENSITIVE Sensitive     CEFEPIME <=0.12 SENSITIVE Sensitive     CEFTRIAXONE <=0.25 SENSITIVE Sensitive     CIPROFLOXACIN <=0.25 SENSITIVE Sensitive     GENTAMICIN <=1 SENSITIVE Sensitive     IMIPENEM <=0.25 SENSITIVE Sensitive     NITROFURANTOIN 64 INTERMEDIATE Intermediate     TRIMETH/SULFA <=20 SENSITIVE Sensitive     AMPICILLIN/SULBACTAM <=2 SENSITIVE Sensitive     PIP/TAZO <=4 SENSITIVE Sensitive     * >=100,000 COLONIES/mL KLEBSIELLA PNEUMONIAE  Culture, blood (Routine X 2) w Reflex to ID Panel     Status: None (Preliminary result)   Collection Time: 12/13/21  8:33 AM   Specimen: BLOOD  Result Value Ref Range Status   Specimen Description BLOOD BLOOD RIGHT HAND  Final   Special Requests   Final    BOTTLES DRAWN AEROBIC AND ANAEROBIC Blood Culture results may not be optimal due to an inadequate volume of blood received in culture bottles   Culture   Final    NO GROWTH 2 DAYS Performed at [facility] [address] [place], [place] [address]    Report Status PENDING  Incomplete  Culture, blood (Routine X 2) w Reflex to ID Panel     Status: None (Preliminary result)   Collection Time: 12/13/21  8:33 AM   Specimen: BLOOD  Result Value Ref Range Status   Specimen Description BLOOD BLOOD RIGHT HAND  Final   Special Requests   Final    BOTTLES DRAWN AEROBIC AND ANAEROBIC Blood Culture results may not be optimal due to an inadequate volume of blood received in culture bottles   Culture   Final    NO GROWTH 2 DAYS Performed at [facility], [place] [address], [place], [place] [address]    Report Status PENDING  Incomplete     Radiology Studies: IR Catheter Tube Change  Result Date: 12/16/2021 CLINICAL DATA:  Long-term sigmoid diverticular disease, post drain catheter  placement 06/21/2021, subsequent revisions, now with leakage around catheter EXAM:  ABSCESS DRAIN CATHETER EXCHANGE AND REVISION UNDER FLUOROSCOPY TECHNIQUE: The procedure, risks (including but not limited to bleeding, infection, organ damage), benefits, and alternatives were explained to the patient. Questions regarding the procedure were encouraged and answered. The patient understands and consents to the procedure. Drain catheter and surrounding skin prepped and draped in usual sterile fashion. Intravenous Fentanyl 52mg and Versed '1mg'$  were administered as conscious sedation during continuous monitoring of the patient's level of consciousness and physiological / cardiorespiratory status by the radiology RN, with a total moderate sedation time of 10 minutes. Survey fluoroscopic inspection reveals stable position of the left pelvic pigtail drain catheter. Injection demonstrates persistent fistula to sigmoid colon. No significant abscess cavity is opacified. 1% lidocaine infiltrated locally around the drain catheter. The catheter was cut and exchanged over an Amplatz wire for a new 14 French pigtail catheter, formed centrally at the level of the previous device. Catheter injection shows continued flow into the lumen of the sigmoid colon. No abscess cavity identified. Catheter secured externally 0 Prolene suture and StatLock and placed to suction drain bulb. The patient tolerated the procedure well. COMPLICATIONS: None immediate FLUOROSCOPY: Radiation Exposure Index (as provided by the fluoroscopic device): 3 mGy air Kerma IMPRESSION: Technically successful exchange and upsizing of the abscess drain catheter to a 14 FPakistandevice. Electronically Signed   By: DLucrezia EuropeM.D.   On: 12/16/2021 08:44    Scheduled Meds:  buPROPion  300 mg Oral Daily   busPIRone  5 mg Oral BID   Chlorhexidine Gluconate Cloth  6 each Topical Daily   enoxaparin (LOVENOX) injection  40 mg Subcutaneous Q24H   feeding supplement  237 mL  Oral TID WC   insulin aspart  0-6 Units Subcutaneous Q6H   lip balm   Topical BID   pantoprazole (PROTONIX) IV  40 mg Intravenous QHS   sodium chloride flush  10-40 mL Intracatheter Q12H   sodium chloride flush  3 mL Intravenous Q12H   Continuous Infusions:  ondansetron (ZOFRAN) IV     piperacillin-tazobactam (ZOSYN)  IV 3.375 g (12/16/21 0555)   TPN ADULT (ION) 53 mL/hr at 12/15/21 1730   TPN ADULT (ION)       LOS: 4 days   RDarliss Cheney MD [facility]  12/16/2021, 9:54 AM   *Please note that this is a verbal dictation therefore any spelling or grammatical errors are due to the "[email or web address]" system interpretation.  Please page via [place]and do not message via secure chat for urgent patient care matters. Secure chat can be used for non urgent patient care matters.  How to contact the TA[facility] Surgery Center IncAttending or Consulting provider 7[place] covering provider during after hours 7[place] for this patient?  Check the care team in [facility]and look for a) attending/consulting TRH provider listed and b) the TRavine Way Surgery Center LLCteam listed. Page or secure chat [phone]-[phone]. Log into [email or web address] and use [place] universal password to access. If you do not have the password, please contact the hospital operator. Locate the THoward County Medical Centerprovider you are looking for under [facility] and page to a number that you can be directly reached. If you still have difficulty reaching the provider, please page the DSt [facility](Director on Call) for the Hospitalists listed on amion for assistance.

## 2021-12-16 NOTE — Progress Notes (Addendum)
[facility] Progress Note     Subjective: CC:  NAEO. Reports drinking <1 boost yesterday due to nausea and decreased appetite. IR replaced her drain and its now a bulb drain.   Objective: Vital signs in last 24 hours: Temp:  [98 F (36.7 C)-99 F (37.2 C)] 98 F (36.7 C) (10/11 0931) Pulse Rate:  [82-99] 94 (10/11 0931) Resp:  [12-20] 18 (10/11 0931) BP: (102-127)/(50-75) 123/70 (10/11 0931) SpO2:  [94 %-100 %] 98 % (10/11 0931) Last BM Date : 12/16/21  Intake/Output from previous day: 10/10 0701 - 10/11 0700 In: 269.4 [I.V.:219.4; IV Piggyback:50] Out: 50 [Drains:50] Intake/Output this shift: Total I/O In: -  Out: 140 [Drains:140]  PE: Gen:  Alert, NAD, pleasant Card:  Regular rate and rhythm, pedal pulses 2+ BL Pulm:  Normal effort, clear to auscultation bilaterally Abd: Soft, mild TTP RLQ, JP with feculent material, no peritonitis, WOC RN markings noted Skin: warm and dry, no rashes  Psych: A&Ox3   Lab Results:  Recent Labs    12/15/21 0239  WBC 5.0  HGB 11.0*  HCT 33.3*  PLT 234   BMET Recent Labs    12/15/21 0239 12/16/21 0413  NA 136 137  K 4.0 4.3  CL 100 101  CO2 29 29  GLUCOSE 122* 135*  BUN 10 14  CREATININE 0.54 0.63  CALCIUM 9.2 9.4   PT/INR No results for input(s): "LABPROT", "INR" in the last 72 hours. CMP     Component Value Date/Time   NA 137 12/16/2021 0413   NA 141 04/07/2016 1348   K 4.3 12/16/2021 0413   CL 101 12/16/2021 0413   CO2 29 12/16/2021 0413   GLUCOSE 135 (H) 12/16/2021 0413   BUN 14 12/16/2021 0413   BUN 16 04/07/2016 1348   CREATININE 0.63 12/16/2021 0413   CREATININE 0.62 09/16/2015 1245   CALCIUM 9.4 12/16/2021 0413   PROT 6.1 (L) 12/14/2021 0307   PROT 6.7 04/07/2016 1348   ALBUMIN 2.8 (L) 12/14/2021 0307   ALBUMIN 4.5 04/07/2016 1348   AST 13 (L) 12/14/2021 0307   ALT 12 12/14/2021 0307   ALKPHOS 39 12/14/2021 0307   BILITOT 0.7 12/14/2021 0307   BILITOT 0.4 04/07/2016 1348   GFRNONAA  >60 12/16/2021 0413   GFRAA >60 06/03/2016 1101   Lipase     Component Value Date/Time   LIPASE 23 12/11/2021 1644     Studies/Results: IR Catheter Tube Change  Result Date: 12/16/2021 CLINICAL DATA:  Long-term sigmoid diverticular disease, post drain catheter placement 06/21/2021, subsequent revisions, now with leakage around catheter EXAM: ABSCESS DRAIN CATHETER EXCHANGE AND REVISION UNDER FLUOROSCOPY TECHNIQUE: The procedure, risks (including but not limited to bleeding, infection, organ damage), benefits, and alternatives were explained to the patient. Questions regarding the procedure were encouraged and answered. The patient understands and consents to the procedure. Drain catheter and surrounding skin prepped and draped in usual sterile fashion. Intravenous Fentanyl 44mg and Versed '1mg'$  were administered as conscious sedation during continuous monitoring of the patient's level of consciousness and physiological / cardiorespiratory status by the radiology RN, with a total moderate sedation time of 10 minutes. Survey fluoroscopic inspection reveals stable position of the left pelvic pigtail drain catheter. Injection demonstrates persistent fistula to sigmoid colon. No significant abscess cavity is opacified. 1% lidocaine infiltrated locally around the drain catheter. The catheter was cut and exchanged over an Amplatz wire for a new 14 French pigtail catheter, formed centrally at the level of the previous device. Catheter  injection shows continued flow into the lumen of the sigmoid colon. No abscess cavity identified. Catheter secured externally 0 Prolene suture and StatLock and placed to suction drain bulb. The patient tolerated the procedure well. COMPLICATIONS: None immediate FLUOROSCOPY: Radiation Exposure Index (as provided by the fluoroscopic device): 3 mGy air Kerma IMPRESSION: Technically successful exchange and upsizing of the abscess drain catheter to a 14 [place] device. Electronically  Signed   By: Lucrezia Europe M.D.   On: 12/16/2021 08:44    Anti-infectives: Anti-infectives (From admission, onward)    Start     Dose/Rate Route Frequency Ordered Stop   12/12/21 1400  piperacillin-tazobactam (ZOSYN) IVPB 3.375 g        3.375 g 12.5 mL/hr over 240 Minutes Intravenous Every 8 hours 12/12/21 0738 12/17/21 1359   12/12/21 0745  piperacillin-tazobactam (ZOSYN) IVPB 3.375 g        3.375 g 100 mL/hr over 30 Minutes Intravenous  Once 12/12/21 0740 12/12/21 0922   12/12/21 0300  cefTRIAXone (ROCEPHIN) 1 g in sodium chloride 0.9 % 100 mL IVPB        1 g 200 mL/hr over 30 Minutes Intravenous  Once 12/12/21 0252 12/12/21 0527        Assessment/Plan Acute on chronic sigmoid diverticulitis with perforation, colonic fistula with IR drain - Afebrile, VSS, WBC WNL - No emergent surgical needs.  - Continue IV antibiotics - . She appears non-toxic but, given weeks of decreased appetite, decreased PO intake, fatigue, subjective chills, abdominal discomfort, I think she has failed outpatient management and needs an operation which would be exploratory laparotomy, partial colectomy, and end colostomy. WOC RN marked her abdomen 10/9. Pre-albumin 7 from 10. Ideally her nutritional status could improve prior to surgery to decrease her risk of issues with wound healing, stump leak, etc post-operatively. Will discuss surgical timing with MD.   FEN: FLD, Ensure or Boost BID or TID. TPN. Repeat pre-albumin Monday ID: Zosyn, UA with UTI, culture pending. UTI asymptomatic. Dispo: Continue IV abx, FLD, TPN.     LOS: 4 days   I reviewed last 24 h vitals and pain scores, last 48 h intake and output, last 24 h labs and trends, and last 24 h imaging results.  This care required moderate level of medical decision making.    Obie Dredge, PA-C [facility] Please see Amion for pager number during day hours 7:00am-4:30pm  Surgery is indicated, but not emergent.  Her fistula is  controlled with the drain.  Operating at this time with such poor nutrition increases the risk of wound infection, dehiscence, possible fistula formation.  Recommend continuing TPN and PO nutritional supplements as tolerated throughout the weekend with possible surgery next week.  Imogene Burn. Georgette Dover, MD, [facility]  General Surgery   12/16/2021 2:14 PM

## 2021-12-16 NOTE — Progress Notes (Signed)
PHARMACY - TOTAL PARENTERAL NUTRITION CONSULT NOTE   Indication: complicated diverticulitis  Patient Measurements: Height: 5' (152.4 cm) Weight: 49.9 kg (110 lb) IBW/kg (Calculated) : 45.5 TPN AdjBW (KG): 49.9 Body mass index is 21.48 kg/m. Usual Weight: 45 kg  Assessment:  71 yo F admitted on 10/7 who presented with weakness and 8lb weight loss over 2 weeks. Patient been eating "poorly" x~29monthat home with limited toleration d/t diverticulitis. (Has cutaneous drain in place since April 2023 for diverticulitis w/ abscess).   Glucose / Insulin: CBG<180, 5 unit/24h SSI required, A1c 5.4% Electrolytes:  coCa 10.4; all WNL  Renal: Scr <1; BUN WNL Hepatic: Alb 2.8, prealbumin 7, LFTs WNL  Intake / Output; MIVF: UOP: no acurately documented, 1 episode of emesis, LBM 10/10 GI Imaging: 10/7 CTAP colon diverticulosis; no recurrent abscess; complicated acute on chronic sigmoid diverticulitis with progression since June GI Surgeries / Procedures:  10/10 LLQ abscess drain catheter exchange in IR Planning for GI operation- Hartman's procedure this week, pending repeat pre-albumin level   Central access: PICC (double-lumen) placed 10/8 TPN start date: 10/8  Nutritional Goals: Goal TPN rate is 70 mL/hr (provides 94 g of protein and 1792 kcals per day)  RD Assessment: Estimated Needs Total Energy Estimated Needs: 1600-1800 Total Protein Estimated Needs: 80-95 grams Total Fluid Estimated Needs: >/= 1.6 L  Current Nutrition:  TPN + FLD, then NPO (after midnight)  Note: Pt does not like Ensure, but takes home Boost High Protein TID - reports 1 Boost on 10/10 due to NPO status for procedure and significant nausea/vomiting (250kcal, 20g protein per carton)  Plan:  Advance TPN to goal rate of 70 mL/hr at 1800 - providing 94g protein and 1792kcal which provides 100% of patient's kcal needs. Electrolytes in TPN: Na 365m/L, K 401mL, Ca 1mE59m, Mg 3mEq73m and Phos 18mmo16m Max Cl. Add  standard MVI and trace elements to TPN Add thiamine '100mg'$  to TPN daily x7 days for high risk of re-feeding syndrome Change to Very Sensitive SSI q6h and adjust as needed, consider discontinuing once at goal TPN Monitor TPN labs on Mon/Thurs, daily TPN labs until stable  Catarino Vold Luisa HartmD, BCPS Clinical Pharmacist 12/16/2021 8:25 AM   Please refer to AM[facility]Charmacy phone number

## 2021-12-16 NOTE — TOC Initial Note (Addendum)
Transition of Care [facility]) - Initial/Assessment Note    Patient Details  Name: Tracy Estrada MRN: [id number] Date of Birth: 1950/03/13  Transition of Care [facility]) CM/SW Contact:    Sharin Mons, RN Phone Number: 12/16/2021, 3:13 PM  Clinical Narrative:     Presents with c/o weakness. Complicated sigmoid diverticulitis /urinary tract infection, bilateral hydronephrosis. Hx of diverticulitis with abscess, s/p cutaneous drain in place since 06/2021.    From home with husband. Supportive family. States PTA independent with ADL's ,  no DME usage.      -  Pt with colonic fistula with IR drain, S/p LLQ abscess drain catheter exchange for 51F, 10/10    Per surgery : Recommend continuing TPN and PO nutritional supplements as tolerated throughout the weekend with possible surgery next week.  TOC team following and will assist with needs....   Expected Discharge Plan: Home/Self Care Barriers to Discharge: Continued Medical Work up   Patient Goals and CMS Choice        Expected Discharge Plan and Services Expected Discharge Plan: Home/Self Care   Discharge Planning Services: CM Consult   Living arrangements for the past 2 months: Single Family Home                                      Prior Living Arrangements/Services Living arrangements for the past 2 months: Single Family Home Lives with:: Spouse Patient language and need for interpreter reviewed:: Yes Do you feel safe going back to the place where you live?: Yes      Need for Family Participation in Patient Care: Yes (Comment) Care giver support system in place?: Yes (comment)   Criminal Activity/Legal Involvement Pertinent to Current Situation/Hospitalization: No - Comment as needed  Activities of Daily Living Home Assistive Devices/Equipment: Eyeglasses, Walker (specify type) ADL Screening (condition at time of admission) Patient's cognitive ability adequate to safely complete daily activities?: Yes Is the  patient deaf or have difficulty hearing?: Yes Does the patient have difficulty seeing, even when wearing glasses/contacts?: Yes Does the patient have difficulty concentrating, remembering, or making decisions?: Yes Patient able to express need for assistance with ADLs?: Yes Does the patient have difficulty dressing or bathing?: No Independently performs ADLs?: Yes (appropriate for developmental age) Does the patient have difficulty walking or climbing stairs?: Yes Weakness of Legs: Both Weakness of Arms/Hands: Both  Permission Sought/Granted                  Emotional Assessment Appearance:: Appears stated age            Admission diagnosis:  Diverticulitis [K57.92] Acute lower UTI [N39.0] Generalized weakness [R53.1] Diverticulitis of intestine with abscess [K57.80] Patient Active Problem List   Diagnosis Date Noted   Urinary tract infection 12/12/2021   Bilateral hydronephrosis 12/12/2021   Acute pain of left knee 11/02/2021   Memory changes 07/21/2021   Intra-abdominal abscess ([place]) 06/21/2021   Normocytic anemia 06/21/2021   Right pelvic adnexal fluid collection 06/21/2021   Sigmoid diverticulitis 06/20/2021   Neutropenia ([place]) 01/28/2020   Chronic right shoulder pain 01/28/2020   Foul smelling urine 09/04/2019   Medicare annual wellness visit, subsequent 01/25/2019   Hyperlipidemia 01/25/2019   Osteoporosis 01/25/2019   Rash and nonspecific skin eruption 04/13/2018   Fibromyalgia 09/16/2015   Anxiety and depression 09/16/2015   Insomnia 09/16/2015   Genetic testing 03/21/2015   Family history of breast cancer in  sister 02/27/2015   Family history of ovarian cancer 02/27/2015   Family history of colon cancer 02/27/2015   PCP:  [name], NP Pharmacy:   [id number] [id number]: [phone]: [phone]    Social Determinants of  Health (SDOH) Interventions    Readmission Risk Interventions     No data to display

## 2021-12-16 NOTE — Plan of Care (Addendum)
Pt c/o nausea, no vomit overnight. Pt had 1 loose BM. Feculent drain from JP has output of 50 ml.   Problem: Activity: Goal: Risk for activity intolerance will decrease Outcome: Progressing   Problem: Coping: Goal: Level of anxiety will decrease Outcome: Progressing   Problem: Safety: Goal: Ability to remain free from injury will improve Outcome: Progressing

## 2021-12-17 ENCOUNTER — Encounter: Payer: Medicare Other | Admitting: Gastroenterology

## 2021-12-17 DIAGNOSIS — K5732 Diverticulitis of large intestine without perforation or abscess without bleeding: Secondary | ICD-10-CM | POA: Diagnosis not present

## 2021-12-17 LAB — COMPREHENSIVE METABOLIC PANEL
ALT: 16 U/L (ref 0–44)
AST: 16 U/L (ref 15–41)
Albumin: 2.9 g/dL — ABNORMAL LOW (ref 3.5–5.0)
Alkaline Phosphatase: 41 U/L (ref 38–126)
Anion gap: 12 (ref 5–15)
BUN: 17 mg/dL (ref 8–23)
CO2: 28 mmol/L (ref 22–32)
Calcium: 10.1 mg/dL (ref 8.9–10.3)
Chloride: 99 mmol/L (ref 98–111)
Creatinine, Ser: 0.58 mg/dL (ref 0.44–1.00)
GFR, Estimated: >60 mL/min (ref >60)
Glucose, Bld: 130 mg/dL — ABNORMAL HIGH (ref 70–99)
Potassium: 4.1 mmol/L (ref 3.5–5.1)
Sodium: 139 mmol/L (ref 135–145)
Total Bilirubin: 0.4 mg/dL (ref 0.3–1.2)
Total Protein: 6.2 g/dL — ABNORMAL LOW (ref 6.5–8.1)

## 2021-12-17 LAB — GLUCOSE, CAPILLARY
Glucose-Capillary: 149 mg/dL — ABNORMAL HIGH (ref 70–99)
Glucose-Capillary: 119 mg/dL — ABNORMAL HIGH (ref 70–99)

## 2021-12-17 LAB — MAGNESIUM: Magnesium: 2.2 mg/dL (ref 1.7–2.4)

## 2021-12-17 LAB — TRIGLYCERIDES: Triglycerides: 89 mg/dL (ref <150)

## 2021-12-17 LAB — PHOSPHORUS: Phosphorus: 3.8 mg/dL (ref 2.5–4.6)

## 2021-12-17 MED ORDER — PIPERACILLIN-TAZOBACTAM 3.375 G IVPB
3.3750 g | Freq: Three times a day (TID) | INTRAVENOUS | Status: AC
  Administered 2021-12-17 – 2021-12-27 (×32): 3.375 g via INTRAVENOUS
  Filled 2021-12-17 (×32): qty 50

## 2021-12-17 MED ORDER — TRACE MINERALS CU-MN-SE-ZN 300-55-60-3000 MCG/ML IV SOLN
INTRAVENOUS | Status: AC
  Filled 2021-12-17: qty 627.2

## 2021-12-17 NOTE — Progress Notes (Signed)
Supervising Physician: Aletta Edouard  Patient Status:  Tracy Estrada - In-pt  Chief Complaint:  LLQ diverticular drain with multiple exchanges since original placement 06/21/21 and most recently exchanged 12/15/21  Subjective:  Patient alert and sitting upright in bed at time of exam. She reports no questions or concerns at this time.   Allergies: Demerol [meperidine], Kenalog [triamcinolone acetonide], and Adhesive [tape]  Medications: Prior to Admission medications   Medication Sig Start Date End Date Taking? Authorizing Provider  acetaminophen (TYLENOL) 325 MG tablet Take 650 mg by mouth every 6 (six) hours as needed for moderate pain or headache.   Yes [provider]  buPROPion (WELLBUTRIN XL) 150 MG 24 hr tablet Take 2 tablets (300 mg total) by mouth daily. For depression. 03/24/21  Yes [name], NP  busPIRone (BUSPAR) 5 MG tablet TAKE 1 TABLET (5 MG TOTAL) BY MOUTH 2 (TWO) TIMES DAILY. FOR ANXIETY 11/06/21  Yes [name], NP  Cholecalciferol (VITAMIN D3 PO) Take 1 tablet by mouth daily. Unsure of mg   Yes [provider]  fluticasone (FLONASE) 50 MCG/ACT nasal spray Place 2 sprays into the nose daily as needed for allergies.    Yes [provider]  Multiple Vitamins-Minerals (MULTIVITAMIN WITH MINERALS) tablet Take 1 tablet by mouth daily. Centrum Silver for Women   Yes [provider]  Omega-3 Fatty Acids (FISH OIL) 500 MG CAPS Take 500 mg by mouth daily.   Yes [provider]  POTASSIUM PO Take 99 mg by mouth daily.   Yes [provider]  pravastatin (PRAVACHOL) 40 MG tablet TAKE 1 TABLET BY MOUTH EVERY DAY FOR CHOLESTEROL Patient taking differently: Take 40 mg by mouth at bedtime. 05/17/21  Yes [name], NP  vitamin B-12 (CYANOCOBALAMIN) 1000 MCG tablet Take 1,000 mcg by mouth daily.   Yes [provider]     Vital Signs: BP 117/76 (BP Location: Left Arm)   Pulse (!) 108   Temp 98.7 F  (37.1 C)   Resp 17   Ht 5' (1.524 m)   Wt 110 lb 0.2 oz (49.9 kg)   SpO2 96%   BMI 21.48 kg/m   Physical Exam Constitutional:      General: She is not in acute distress.    Appearance: She is not ill-appearing.  Pulmonary:     Effort: Pulmonary effort is normal.  Abdominal:     Palpations: Abdomen is soft.     Tenderness: There is no abdominal tenderness.     Comments: LLQ drain in place connected to JP bulb. Dressing had dried brown material over insertion site, did not appear to be blood. Skin at insertion site without erythema, bleeding, or induration. ~25cc of feculent material in JP bulb at time of exam. Suture in place.  Skin:    General: Skin is warm and dry.  Neurological:     Mental Status: She is alert and oriented to person, place, and time.  Psychiatric:        Mood and Affect: Mood normal.        Behavior: Behavior normal.        Thought Content: Thought content normal.        Judgment: Judgment normal.    Drain Location: LLQ Size: Fr size: 14 Fr Date of placement: 12/15/21  Currently to: Drain collection device: suction bulb 24 hour output:  Output by Drain (mL) 12/15/21 0701 - 12/15/21 1900 12/15/21 1901 - 12/16/21 0700 12/16/21 0701 - 12/16/21 1900  12/16/21 1901 - 12/17/21 0700 12/17/21 0701 - 12/17/21 1552  Closed System Drain 1 Left;Anterior LLQ Bulb (JP) 14 Fr.  50 360 90     Interval imaging/drain manipulation:  Drain exchange and upsize to 51F on 10/10, fistula to colon still present  Current examination: Insertion site unremarkable. Suture in place. Dressed appropriately.  ~25 cc of feculent material in drain at time of exam.  Imaging: IR Catheter Tube Change  Result Date: 12/16/2021 CLINICAL DATA:  Long-term sigmoid diverticular disease, post drain catheter placement 06/21/2021, subsequent revisions, now with leakage around catheter EXAM: [place]: The procedure, risks (including  but not limited to bleeding, infection, organ damage), benefits, and alternatives were explained to the patient. Questions regarding the procedure were encouraged and answered. The patient understands and consents to the procedure. Drain catheter and surrounding skin prepped and draped in usual sterile fashion. Intravenous Fentanyl 56mg and Versed '1mg'$  were administered as conscious sedation during continuous monitoring of the patient's level of consciousness and physiological / cardiorespiratory status by the radiology RN, with a total moderate sedation time of 10 minutes. Survey fluoroscopic inspection reveals stable position of the left pelvic pigtail drain catheter. Injection demonstrates persistent fistula to sigmoid colon. No significant abscess cavity is opacified. 1% lidocaine infiltrated locally around the drain catheter. The catheter was cut and exchanged over an Amplatz wire for a new 14 French pigtail catheter, formed centrally at the level of the previous device. Catheter injection shows continued flow into the lumen of the sigmoid colon. No abscess cavity identified. Catheter secured externally 0 Prolene suture and StatLock and placed to suction drain bulb. The patient tolerated the procedure well. COMPLICATIONS: None immediate FLUOROSCOPY: Radiation Exposure Index (as provided by the fluoroscopic device): 3 mGy air Kerma IMPRESSION: Technically successful exchange and upsizing of the abscess drain catheter to a 14 FPakistandevice. Electronically Signed   By: DLucrezia EuropeM.D.   On: 12/16/2021 08:44    Labs:  CBC: Recent Labs    06/26/21 0259 12/11/21 1644 12/11/21 1654 12/13/21 0231 12/15/21 0239  WBC 3.4* 6.8  --  5.4 5.0  HGB 10.6* 12.9 12.6 11.9* 11.0*  HCT 32.3* 38.4 37.0 34.0* 33.3*  PLT 382 316  --  259 234    COAGS: Recent Labs    06/20/21 1417  INR 1.3*    BMP: Recent Labs    12/14/21 0307 12/15/21 0239 12/16/21 0413 12/17/21 0402  NA 137 136 137 139  K 3.2* 4.0  4.3 4.1  CL 103 100 101 99  CO2 '29 29 29 28  '$ GLUCOSE 147* 122* 135* 130*  BUN '9 10 14 17  '$ CALCIUM 9.0 9.2 9.4 10.1  CREATININE 0.63 0.54 0.63 0.58  GFRNONAA >60 >60 >60 >60    LIVER FUNCTION TESTS: Recent Labs    06/21/21 0500 12/11/21 1644 12/14/21 0307 12/17/21 0402  BILITOT 0.8 0.2* 0.7 0.4  AST 12* 22 13* 16  ALT '9 16 12 16  '$ ALKPHOS 43 54 39 41  PROT 6.0* 6.8 6.1* 6.2*  ALBUMIN 2.6* 3.6 2.8* 2.9*    Assessment and Plan:  Tracy Estrada a 71yo female known to IR from prior drain placement 06/21/21 by Dr. SAnnamaria Boots  Patient has undergone several drain evaluations over the past several months in the IR drain clinic which have consistently showed persistent fistula.  During evaluation 09/09/21, she was instructed to discontinue flushes. On 10/21/21 her drain was exchanged in hopes of disrupting  the fistulous connection which did result in smaller, though persistent colonic fistula as documented 11/02/21. At last assessment 9/19, again fistula was demonstrated and she was scheduled for 2 month repeat injection with surgical follow-up. Drain was exchanged again on 12/15/21 by Dr Vernard Gambles. On exam today, dried drainage of feculent material was noted on dressing. Will re-evaluate if there is continued drainage with new dressing placed today.  Plan: Record output Q shift. Dressing changes QD or PRN if soiled.  Call IR APP or on call IR MD if difficulty flushing or sudden change in drain output.  Repeat imaging/possible drain injection once output < 10 mL/QD (excluding flush material). Consideration for drain removal if output is < 10 mL/QD (excluding flush material), pending discussion with the providing surgical service.  Discharge planning: Please contact IR APP or on call IR MD prior to patient d/c to ensure appropriate follow up plans are in place. Typically patient will follow up with IR clinic 10-14 days post d/c for repeat imaging/possible drain injection. IR scheduler will contact  patient with date/time of appointment. Patient will need to flush drain QD with 5 cc NS, record output QD, dressing changes every 2-3 days or earlier if soiled.   IR will continue to follow - please call with questions or concerns.   Electronically Signed: Lura Em, PA-C 12/17/2021, 3:48 PM   I spent a total of 15 Minutes at the the patient's bedside AND on the patient's hospital floor or unit, greater than 50% of which was counseling/coordinating care for LLQ diverticular drain.

## 2021-12-17 NOTE — Progress Notes (Signed)
1730- Patient's IR drain dressing changed this shift. Clean, dry, intact.

## 2021-12-17 NOTE — Progress Notes (Signed)
PROGRESS NOTE    Tracy Estrada  OFH:[id number] DOB: [birth year] DOA: 12/11/2021 PCP: [name], NP   Brief Narrative:  Patient is a 71 year old female who presented to the ED with complaint of 2-week history of fatigue, chills, decreased appetite, vomiting.  She was admitted in April and was managed for perforated diverticulitis and was managed nonoperatively and eventually had IR drain placed, and was discharged.  Admitted for further management.  General surgery following.  Found to have acute on chronic sigmoid diverticulitis with perforation, persistent sigmoid colonic fistula.  Started on TPN, full liquid diet.  General surgery planning for Hartman's procedure this week.  Assessment & Plan:   Principal Problem:   Sigmoid diverticulitis Active Problems:   Urinary tract infection   Bilateral hydronephrosis   Anxiety and depression   Hyperlipidemia  Acute on chronic sigmoid diverticulitis with perforation/colonic fistula: Status post drain placement by IR.  Was managed conservatively with antibiotics.  Presented with fever, chills, fatigue.  Currently afebrile, no leukocytosis, hemodynamically stable.  CT imaging showed slight progression of inflammatory changes around the sigmoid colon compared to last one.  General surgery following.  Found to have acute on chronic sigmoid diverticulitis with perforation, persistent sigmoid colonic fistula.  Started on TPN, full liquid diet.  Underwent drain exchange by radiology on 12/15/2021.  General surgery planning for Hartman's procedure next week and allow her some time to have some nutrition. Currently on Zosyn.  Blood culture  sent.  No growth till date.   Urinary tract infection/bilateral hydronephrosis: UA was positive for leukocytes, nitrates, bacteria.  Patient was also having increased frequency.  CT showed mild bilateral hydronephrosis without signs of stricture or fistula.   urine culture showed Klebsiella which is pansensitive.    Already on Zosyn   Anxiety/ depression: On Wellbutrin   Hyperlipidemia: On pravastatin   Nutrition Problem: Moderate Malnutrition Etiology: acute illness.  She is on TPN and p.o. boost.  DVT prophylaxis: enoxaparin (LOVENOX) injection 40 mg Start: 12/12/21 2200   Code Status: Full Code  Family Communication:  None present at bedside.  Plan of care discussed with patient in length and he/she verbalized understanding and agreed with it.  Status is: Inpatient Remains inpatient appropriate because: General surgery planning for surgery next week.  Estimated body mass index is 21.48 kg/m as calculated from the following:   Height as of this encounter: 5' (1.524 m).   Weight as of this encounter: 49.9 kg.    Nutritional Assessment: Body mass index is 21.48 kg/m.Marland Kitchen Seen by dietician.  I agree with the assessment and plan as outlined below: Nutrition Status: Nutrition Problem: Moderate Malnutrition Etiology: acute illness Signs/Symptoms: energy intake < 75% for > 7 days, percent weight loss, mild fat depletion, mild muscle depletion (5% wt loss in 2 weeks) Percent weight loss: 5 % Interventions: TPN, Ensure Enlive (each supplement provides 350kcal and 20 grams of protein), MVI  . Skin Assessment: I have examined the patient's skin and I agree with the wound assessment as performed by the wound care RN as outlined below:    Consultants:  IR General surgery  Procedures:  As above  Antimicrobials:  Anti-infectives (From admission, onward)    Start     Dose/Rate Route Frequency Ordered Stop   12/12/21 1400  piperacillin-tazobactam (ZOSYN) IVPB 3.375 g        3.375 g 12.5 mL/hr over 240 Minutes Intravenous Every 8 hours 12/12/21 0738 12/17/21 1359   12/12/21 0745  piperacillin-tazobactam (ZOSYN) IVPB 3.375 g  3.375 g 100 mL/hr over 30 Minutes Intravenous  Once 12/12/21 0740 12/12/21 0922   12/12/21 0300  cefTRIAXone (ROCEPHIN) 1 g in sodium chloride 0.9 % 100 mL IVPB         1 g 200 mL/hr over 30 Minutes Intravenous  Once 12/12/21 0252 12/12/21 0527         Subjective:  Patient seen and examined.  She has no complaints.  Objective: Vitals:   12/16/21 2213 12/17/21 0554 12/17/21 0809 12/17/21 0900  BP: 115/65 120/68 128/62 121/74  Pulse: (!) 103 94 98 97  Resp: '14 16 17 17  '$ Temp: 98.6 F (37 C) 97.7 F (36.5 C) 98.3 F (36.8 C) 98.4 F (36.9 C)  TempSrc: Oral   Oral  SpO2: 95% 92% 93% 93%  Weight:    49.9 kg  Height:    5' (1.524 m)    Intake/Output Summary (Last 24 hours) at 12/17/2021 0950 Last data filed at 12/17/2021 0609 Gross per 24 hour  Intake 10 ml  Output 390 ml  Net -380 ml    Filed Weights   12/11/21 1614 12/17/21 0900  Weight: 49.9 kg 49.9 kg    Examination:  General exam: Appears calm and comfortable  Respiratory system: Clear to auscultation. Respiratory effort normal. Cardiovascular system: S1 & S2 heard, RRR. No JVD, murmurs, rubs, gallops or clicks. No pedal edema. Gastrointestinal system: Abdomen is nondistended, soft and moderate tenderness at left lower quadrant. No organomegaly or masses felt. Normal bowel sounds heard. Central nervous system: Alert and oriented. No focal neurological deficits. Extremities: Symmetric 5 x 5 power. Skin: No rashes, lesions or ulcers.  Psychiatry: Judgement and insight appear normal. Mood & affect appropriate.   Data Reviewed: I have personally reviewed following labs and imaging studies  CBC: Recent Labs  Lab 12/11/21 1644 12/11/21 1654 12/13/21 0231 12/15/21 0239  WBC 6.8  --  5.4 5.0  NEUTROABS 4.2  --   --   --   HGB 12.9 12.6 11.9* 11.0*  HCT 38.4 37.0 34.0* 33.3*  MCV 99.2  --  96.3 99.1  PLT 316  --  259 161    Basic Metabolic Panel: Recent Labs  Lab 12/13/21 0227 12/13/21 0231 12/14/21 0307 12/15/21 0239 12/16/21 0413 12/17/21 0402  NA  --  137 137 136 137 139  K  --  3.7 3.2* 4.0 4.3 4.1  CL  --  101 103 100 101 99  CO2  --  '27 29 29 29 28   '$ GLUCOSE  --  102* 147* 122* 135* 130*  BUN  --  '9 9 10 14 17  '$ CREATININE  --  0.77 0.63 0.54 0.63 0.58  CALCIUM  --  9.6 9.0 9.2 9.4 10.1  MG 2.1  --  2.2 2.1 2.3 2.2  PHOS 4.3  --  3.0 3.0 3.6 3.8    GFR: Estimated Creatinine Clearance: 45 mL/min (by C-G formula based on SCr of 0.58 mg/dL). Liver Function Tests: Recent Labs  Lab 12/11/21 1644 12/14/21 0307 12/17/21 0402  AST 22 13* 16  ALT '16 12 16  '$ ALKPHOS 54 39 41  BILITOT 0.2* 0.7 0.4  PROT 6.8 6.1* 6.2*  ALBUMIN 3.6 2.8* 2.9*    Recent Labs  Lab 12/11/21 1644  LIPASE 23    No results for input(s): "AMMONIA" in the last 168 hours. Coagulation Profile: No results for input(s): "INR", "PROTIME" in the last 168 hours. Cardiac Enzymes: No results for input(s): "CKTOTAL", "CKMB", "CKMBINDEX", "TROPONINI" in the  last 168 hours. BNP (last 3 results) No results for input(s): "PROBNP" in the last 8760 hours. HbA1C: No results for input(s): "HGBA1C" in the last 72 hours.  CBG: Recent Labs  Lab 12/16/21 0723 12/16/21 1216 12/16/21 1544 12/16/21 2328 12/17/21 0556  GLUCAP 122* 120* 128* 183* 149*    Lipid Profile: Recent Labs    12/17/21 0402  TRIG 89    Thyroid Function Tests: No results for input(s): "TSH", "T4TOTAL", "FREET4", "T3FREE", "THYROIDAB" in the last 72 hours. Anemia Panel: No results for input(s): "VITAMINB12", "FOLATE", "FERRITIN", "TIBC", "IRON", "RETICCTPCT" in the last 72 hours. Sepsis Labs: No results for input(s): "PROCALCITON", "LATICACIDVEN" in the last 168 hours.  Recent Results (from the past 240 hour(s))  Urine Culture     Status: Abnormal   Collection Time: 12/12/21 10:38 AM   Specimen: Urine, Clean Catch  Result Value Ref Range Status   Specimen Description URINE, CLEAN CATCH  Final   Special Requests   Final    NONE Performed at [facility], [address] [place], [place] [address]    Culture >=100,000 COLONIES/mL KLEBSIELLA PNEUMONIAE (A)  Final   Report  Status 12/14/2021 FINAL  Final   Organism ID, Bacteria KLEBSIELLA PNEUMONIAE (A)  Final      Susceptibility   Klebsiella pneumoniae - MIC*    AMPICILLIN RESISTANT Resistant     CEFAZOLIN <=4 SENSITIVE Sensitive     CEFEPIME <=0.12 SENSITIVE Sensitive     CEFTRIAXONE <=0.25 SENSITIVE Sensitive     CIPROFLOXACIN <=0.25 SENSITIVE Sensitive     GENTAMICIN <=1 SENSITIVE Sensitive     IMIPENEM <=0.25 SENSITIVE Sensitive     NITROFURANTOIN 64 INTERMEDIATE Intermediate     TRIMETH/SULFA <=20 SENSITIVE Sensitive     AMPICILLIN/SULBACTAM <=2 SENSITIVE Sensitive     PIP/TAZO <=4 SENSITIVE Sensitive     * >=100,000 COLONIES/mL KLEBSIELLA PNEUMONIAE  Culture, blood (Routine X 2) w Reflex to ID Panel     Status: None (Preliminary result)   Collection Time: 12/13/21  8:33 AM   Specimen: BLOOD  Result Value Ref Range Status   Specimen Description BLOOD BLOOD RIGHT HAND  Final   Special Requests   Final    BOTTLES DRAWN AEROBIC AND ANAEROBIC Blood Culture results may not be optimal due to an inadequate volume of blood received in culture bottles   Culture   Final    NO GROWTH 3 DAYS Performed at [facility] [address] [place], [place] [address]    Report Status PENDING  Incomplete  Culture, blood (Routine X 2) w Reflex to ID Panel     Status: None (Preliminary result)   Collection Time: 12/13/21  8:33 AM   Specimen: BLOOD  Result Value Ref Range Status   Specimen Description BLOOD BLOOD RIGHT HAND  Final   Special Requests   Final    BOTTLES DRAWN AEROBIC AND ANAEROBIC Blood Culture results may not be optimal due to an inadequate volume of blood received in culture bottles   Culture   Final    NO GROWTH 3 DAYS Performed at [facility], [place] [address], [place],  [address]    Report Status PENDING  Incomplete     Radiology Studies: IR Catheter Tube Change  Result Date: 12/16/2021 CLINICAL DATA:  Long-term sigmoid diverticular disease, post drain catheter  placement 06/21/2021, subsequent revisions, now with leakage around catheter EXAM: [place]: The procedure, risks (including but not  limited to bleeding, infection, organ damage), benefits, and alternatives were explained to the patient. Questions regarding the procedure were encouraged and answered. The patient understands and consents to the procedure. Drain catheter and surrounding skin prepped and draped in usual sterile fashion. Intravenous Fentanyl 23mg and Versed '1mg'$  were administered as conscious sedation during continuous monitoring of the patient's level of consciousness and physiological / cardiorespiratory status by the radiology RN, with a total moderate sedation time of 10 minutes. Survey fluoroscopic inspection reveals stable position of the left pelvic pigtail drain catheter. Injection demonstrates persistent fistula to sigmoid colon. No significant abscess cavity is opacified. 1% lidocaine infiltrated locally around the drain catheter. The catheter was cut and exchanged over an Amplatz wire for a new 14 French pigtail catheter, formed centrally at the level of the previous device. Catheter injection shows continued flow into the lumen of the sigmoid colon. No abscess cavity identified. Catheter secured externally 0 Prolene suture and StatLock and placed to suction drain bulb. The patient tolerated the procedure well. COMPLICATIONS: None immediate FLUOROSCOPY: Radiation Exposure Index (as provided by the fluoroscopic device): 3 mGy air Kerma IMPRESSION: Technically successful exchange and upsizing of the abscess drain catheter to a 14 FPakistandevice. Electronically Signed   By: DLucrezia EuropeM.D.   On: 12/16/2021 08:44    Scheduled Meds:  buPROPion  300 mg Oral Daily   busPIRone  5 mg Oral BID   Chlorhexidine Gluconate Cloth  6 each Topical Daily   enoxaparin (LOVENOX) injection  40 mg Subcutaneous Q24H   feeding supplement  237 mL  Oral TID WC   lip balm   Topical BID   pantoprazole (PROTONIX) IV  40 mg Intravenous QHS   sodium chloride flush  10-40 mL Intracatheter Q12H   sodium chloride flush  3 mL Intravenous Q12H   Continuous Infusions:  ondansetron (ZOFRAN) IV     piperacillin-tazobactam (ZOSYN)  IV 3.375 g (12/17/21 0607)   TPN ADULT (ION) 70 mL/hr at 12/16/21 1730   TPN ADULT (ION)       LOS: 5 days   RDarliss Cheney MD [facility]  12/17/2021, 9:50 AM   *Please note that this is a verbal dictation therefore any spelling or grammatical errors are due to the "DBeckerOne" system interpretation.  Please page via AN[place]villeand do not message via secure chat for urgent patient care matters. Secure chat can be used for non urgent patient care matters.  How to contact the TRush [facility] Surgicenter LLCAttending or Consulting provider [place]or covering provider during after hours [place] for this patient?  Check the care team in [facility] Hospitaland look for a) attending/consulting TRH provider listed and b) the TJonathan [facility]. [facility] Centerteam listed. Page or secure chat [phone]. Log into [email or web address] and use [place]'s universal password to access. If you do not have the password, please contact the hospital operator. Locate the TNew Mexico Rehabilitation Centerprovider you are looking for under [facility] and page to a number that you can be directly reached. If you still have difficulty reaching the provider, please page the [facility](Director on Call) for the Hospitalists listed on amion for assistance.

## 2021-12-17 NOTE — Progress Notes (Signed)
Mobility Specialist Progress Note   12/17/21 1431  Mobility  Activity Ambulated with assistance in room  Level of Assistance Contact guard assist, steadying assist  Assistive Device Front wheel walker  Distance Ambulated (ft) 32 ft  Activity Response Tolerated well  $Mobility charge 1 Mobility   Session limited today by fatigue. 2x22f bouts in room w/o fault but pt requiring x1 seated rest break d/t increased levels of fatigue. Left back in bed w/ all needs met and call bell in reach.  JHolland FallingMobility Specialist MS [facility]#:  [phone] Rehab Office:  [phone]

## 2021-12-17 NOTE — Progress Notes (Signed)
[facility] Progress Note     Subjective: CC-  Having a good day. Has not had any nausea or diarrhea today. She already drank 1 Boost and plans to drink 2 more before the end of the day. Ambulating to the restroom.  Objective: Vital signs in last 24 hours: Temp:  [97.7 F (36.5 C)-98.6 F (37 C)] 98.4 F (36.9 C) (10/12 0900) Pulse Rate:  [92-103] 97 (10/12 0900) Resp:  [14-17] 17 (10/12 0900) BP: (107-128)/(62-74) 121/74 (10/12 0900) SpO2:  [92 %-99 %] 93 % (10/12 0900) Weight:  [49.9 kg] 49.9 kg (10/12 0900) Last BM Date : 12/16/21  Intake/Output from previous day: 10/11 0701 - 10/12 0700 In: 10  Out: 450 [Drains:450] Intake/Output this shift: No intake/output data recorded.  PE: Gen:  Alert, NAD, pleasant Card:  RRR Pulm:  CTAB, no W/R/R, rate and effort normal Abd: Soft, ND, focally tender at drain site, JP with feculent material and cdi dressing  Lab Results:  Recent Labs    12/15/21 0239  WBC 5.0  HGB 11.0*  HCT 33.3*  PLT 234   BMET Recent Labs    12/16/21 0413 12/17/21 0402  NA 137 139  K 4.3 4.1  CL 101 99  CO2 29 28  GLUCOSE 135* 130*  BUN 14 17  CREATININE 0.63 0.58  CALCIUM 9.4 10.1   PT/INR No results for input(s): "LABPROT", "INR" in the last 72 hours. CMP     Component Value Date/Time   NA 139 12/17/2021 0402   NA 141 04/07/2016 1348   K 4.1 12/17/2021 0402   CL 99 12/17/2021 0402   CO2 28 12/17/2021 0402   GLUCOSE 130 (H) 12/17/2021 0402   BUN 17 12/17/2021 0402   BUN 16 04/07/2016 1348   CREATININE 0.58 12/17/2021 0402   CREATININE 0.62 09/16/2015 1245   CALCIUM 10.1 12/17/2021 0402   PROT 6.2 (L) 12/17/2021 0402   PROT 6.7 04/07/2016 1348   ALBUMIN 2.9 (L) 12/17/2021 0402   ALBUMIN 4.5 04/07/2016 1348   AST 16 12/17/2021 0402   ALT 16 12/17/2021 0402   ALKPHOS 41 12/17/2021 0402   BILITOT 0.4 12/17/2021 0402   BILITOT 0.4 04/07/2016 1348   GFRNONAA >60 12/17/2021 0402   GFRAA >60 06/03/2016 1101    Lipase     Component Value Date/Time   LIPASE 23 12/11/2021 1644       Studies/Results: IR Catheter Tube Change  Result Date: 12/16/2021 CLINICAL DATA:  Long-term sigmoid diverticular disease, post drain catheter placement 06/21/2021, subsequent revisions, now with leakage around catheter EXAM: [place]: The procedure, risks (including but not limited to bleeding, infection, organ damage), benefits, and alternatives were explained to the patient. Questions regarding the procedure were encouraged and answered. The patient understands and consents to the procedure. Drain catheter and surrounding skin prepped and draped in usual sterile fashion. Intravenous Fentanyl 23mg and Versed '1mg'$  were administered as conscious sedation during continuous monitoring of the patient's level of consciousness and physiological / cardiorespiratory status by the radiology RN, with a total moderate sedation time of 10 minutes. Survey fluoroscopic inspection reveals stable position of the left pelvic pigtail drain catheter. Injection demonstrates persistent fistula to sigmoid colon. No significant abscess cavity is opacified. 1% lidocaine infiltrated locally around the drain catheter. The catheter was cut and exchanged over an Amplatz wire for a new 14 French pigtail catheter, formed centrally at the level of the previous device. Catheter injection shows continued  flow into the lumen of the sigmoid colon. No abscess cavity identified. Catheter secured externally 0 Prolene suture and StatLock and placed to suction drain bulb. The patient tolerated the procedure well. COMPLICATIONS: None immediate FLUOROSCOPY: Radiation Exposure Index (as provided by the fluoroscopic device): 3 mGy air Kerma IMPRESSION: Technically successful exchange and upsizing of the abscess drain catheter to a 14 [place] device. Electronically Signed   By: Lucrezia Europe M.D.   On: 12/16/2021  08:44    Anti-infectives: Anti-infectives (From admission, onward)    Start     Dose/Rate Route Frequency Ordered Stop   12/12/21 1400  piperacillin-tazobactam (ZOSYN) IVPB 3.375 g        3.375 g 12.5 mL/hr over 240 Minutes Intravenous Every 8 hours 12/12/21 0738 12/17/21 1007   12/12/21 0745  piperacillin-tazobactam (ZOSYN) IVPB 3.375 g        3.375 g 100 mL/hr over 30 Minutes Intravenous  Once 12/12/21 0740 12/12/21 0922   12/12/21 0300  cefTRIAXone (ROCEPHIN) 1 g in sodium chloride 0.9 % 100 mL IVPB        1 g 200 mL/hr over 30 Minutes Intravenous  Once 12/12/21 0252 12/12/21 0527        Assessment/Plan Acute on chronic sigmoid diverticulitis with perforation, colonic fistula with IR drain - Afebrile, VSS - No emergent surgical needs.  - Continue IV antibiotics and drain - She appears non-toxic but failed outpatient management and needs an operation which would be exploratory laparotomy, partial colectomy, and end colostomy. WOC RN marked her abdomen 10/9. Pre-albumin on 10/11 was 7 from 10. Ideally her nutritional status could improve prior to surgery to decrease her risk of issues with wound healing, stump leak, etc post-operatively. Continue TPN and protein shakes for now with plans to recheck prealbumin early next week and hopefully OR next week if improving.   FEN: FLD, Ensure or Boost BID or TID. TPN. Repeat pre-albumin Monday ID: Zosyn, Klebsiella pneumoniae UTI and diverticulitis Dispo: Continue IV abx, FLD, TPN.    I reviewed last 24 h vitals and pain scores, last 48 h intake and output, last 24 h labs and trends, and last 24 h imaging results.    LOS: 5 days    [facility] 12/17/2021, 10:29 AM Please see Amion for pager number during day hours 7:00am-4:30pm

## 2021-12-17 NOTE — Progress Notes (Signed)
PHARMACY - TOTAL PARENTERAL NUTRITION CONSULT NOTE   Indication: complicated diverticulitis  Patient Measurements: Height: 5' (152.4 cm) Weight: 49.9 kg (110 lb) IBW/kg (Calculated) : 45.5 TPN AdjBW (KG): 49.9 Body mass index is 21.48 kg/m. Usual Weight: 45 kg  Assessment:  71 yo F admitted on 10/7 who presented with weakness and 8lb weight loss over 2 weeks. Patient been eating "poorly" x~34monthat home with limited toleration d/t diverticulitis. (Has cutaneous drain in place since April 2023 for diverticulitis w/ abscess).   Glucose / Insulin: CBG<180, 1 unit/24h SSI required, A1c 5.4% Electrolytes:  coCa 11; all WNL  Renal: Scr <1; BUN WNL Hepatic: Alb 2.9, prealbumin 7, LFTs WNL, TG 89 Intake / Output; MIVF: UOP: no acurately documented, LBM 10/10, drain: 450 mL GI Imaging: 10/7 CTAP colon diverticulosis; no recurrent abscess; complicated acute on chronic sigmoid diverticulitis with progression since June GI Surgeries / Procedures:  10/10 LLQ abscess drain catheter exchange in IR Planning for GI operation- Hartman's procedure next week, pending repeat pre-albumin level   Central access: PICC (double-lumen) placed 10/8 TPN start date: 10/8  Nutritional Goals: Goal TPN rate is 70 mL/hr (provides 94 g of protein and 1792 kcals per day)  RD Assessment: Estimated Needs Total Energy Estimated Needs: 1600-1800 Total Protein Estimated Needs: 80-95 grams Total Fluid Estimated Needs: >/= 1.6 L  Current Nutrition:  TPN + FLD  Note: Pt does not like Ensure, but takes home Boost High Protein TID - reports 1 Boost on 10/11 (250kcal, 20g protein per carton)  Plan:  Continue TPN at goal rate of 70 mL/hr at 1800 - providing 94g protein and 1792kcal, 100% of patient's  nutritional goals. Electrolytes in TPN: Na 369m/L, K 4035mL, Ca 0mE97m, Mg 3mEq19m and Phos 18mmo21m Max Cl. Add standard MVI and trace elements to TPN Add thiamine '100mg'$  to TPN daily x7 days for high risk of  re-feeding syndrome (remove from TPN on 10/16) Discontinue SSI q6h, but continue monitoring CBG BID and adjust as needed Monitor TPN labs on Mon/Thurs, daily TPN labs until stable  Adyen Bifulco Luisa HartmD, BCPS Clinical Pharmacist 12/17/2021 1:29 PM   Please refer to AMION [facility] Hospitalharmacy phone number

## 2021-12-18 DIAGNOSIS — K5732 Diverticulitis of large intestine without perforation or abscess without bleeding: Secondary | ICD-10-CM | POA: Diagnosis not present

## 2021-12-18 LAB — BASIC METABOLIC PANEL
Anion gap: 9 (ref 5–15)
BUN: 18 mg/dL (ref 8–23)
CO2: 28 mmol/L (ref 22–32)
Calcium: 9.9 mg/dL (ref 8.9–10.3)
Chloride: 104 mmol/L (ref 98–111)
Creatinine, Ser: 0.59 mg/dL (ref 0.44–1.00)
GFR, Estimated: >60 mL/min (ref >60)
Glucose, Bld: 131 mg/dL — ABNORMAL HIGH (ref 70–99)
Potassium: 4 mmol/L (ref 3.5–5.1)
Sodium: 141 mmol/L (ref 135–145)

## 2021-12-18 LAB — CULTURE, BLOOD (ROUTINE X 2)
Culture: NO GROWTH
Culture: NO GROWTH

## 2021-12-18 LAB — GLUCOSE, CAPILLARY
Glucose-Capillary: 150 mg/dL — ABNORMAL HIGH (ref 70–99)
Glucose-Capillary: 140 mg/dL — ABNORMAL HIGH (ref 70–99)

## 2021-12-18 LAB — MAGNESIUM: Magnesium: 2.3 mg/dL (ref 1.7–2.4)

## 2021-12-18 LAB — PHOSPHORUS: Phosphorus: 4.1 mg/dL (ref 2.5–4.6)

## 2021-12-18 MED ORDER — SODIUM CHLORIDE 0.9% FLUSH
5.0000 mL | Freq: Three times a day (TID) | INTRAVENOUS | Status: DC
  Administered 2021-12-18 – 2021-12-29 (×27): 5 mL

## 2021-12-18 MED ORDER — TRACE MINERALS CU-MN-SE-ZN 300-55-60-3000 MCG/ML IV SOLN
INTRAVENOUS | Status: AC
  Filled 2021-12-18: qty 627.2

## 2021-12-18 MED ORDER — BOOST / RESOURCE BREEZE PO LIQD CUSTOM
1.0000 | Freq: Three times a day (TID) | ORAL | Status: DC
  Administered 2021-12-23: 1 via ORAL

## 2021-12-18 NOTE — Progress Notes (Signed)
Mobility Specialist Progress Note   12/18/21 1539  Mobility  Activity Ambulated with assistance in room  Level of Assistance Contact guard assist, steadying assist  Assistive Device None (HHA)  Distance Ambulated (ft) 28 ft  Activity Response Tolerated well  $Mobility charge 1 Mobility   Pre Mobility: 114  HR, 106/77 BP During Mobility: 132  HR Post Mobility: 110 HR  Received pt in bed having c/o slight nausea and pain at drain site but agreeable. X1 seated rest break for energy conservation but pt stating fatigue. Returned back to bed, left call bell inr each and all needs met.   Holland Falling Mobility Specialist MS [facility] #:  [phone] Acute Rehab Office:  [phone]

## 2021-12-18 NOTE — Progress Notes (Signed)
PHARMACY - TOTAL PARENTERAL NUTRITION CONSULT NOTE   Indication: complicated diverticulitis  Patient Measurements: Height: 5' (152.4 cm) Weight: 49.9 kg (110 lb 0.2 oz) IBW/kg (Calculated) : 45.5 TPN AdjBW (KG): 49.9 Body mass index is 21.48 kg/m. Usual Weight: 45 kg  Assessment:  71 yo F admitted on 10/7 who presented with weakness and 8lb weight loss over 2 weeks. Patient been eating "poorly" x~75monthat home with limited toleration d/t diverticulitis. (Has cutaneous drain in place since April 2023 for diverticulitis w/ abscess). Pharmacy has been consulted for TPN management to improve nutritional status in order to help stimulate positive post-op recovery.   Patient noted nausea after consuming Boost High Protein and Cookout milkshake on 10/12. She states she drank ginger ale this morning, however unsure if she will be able to trial another Boost High Protein later today. Will plan to cycle TPN once tolerating PO intake without nausea.  Glucose / Insulin: CBG <150, CBG checks q12h, SSI discontinued 10/12, A1c 5.4% Electrolytes: stable and WNL Renal: Scr <1; BUN WNL - stable Hepatic: 10/12: Alb 2.9, prealbumin 7, LFTs WNL, TG 89 Intake / Output; MIVF: UOP: not accurately documented, LBM 10/13 (diarrhea per pt), 228mdrain output thus far 10/13 GI Imaging: 10/7 CTAP colon diverticulosis; no recurrent abscess; complicated acute on chronic sigmoid diverticulitis with progression since June GI Surgeries / Procedures:  10/10 LLQ abscess drain catheter exchange in IR Planning for GI operation- Hartman's procedure next week, pending repeat pre-albumin level   Central access: PICC (double-lumen) placed 10/8 TPN start date: 10/8  Nutritional Goals: Goal TPN rate is 70 mL/hr (provides 94 g of protein and 1792 kcals per day)  RD Assessment: Estimated Needs Total Energy Estimated Needs: 1600-1800 Total Protein Estimated Needs: 80-95 grams Total Fluid Estimated Needs: >/= 1.6  L  Current Nutrition:  TPN + FLD Boost High Protein (as tolerated)  Plan:  Continue TPN at goal rate of 70 mL/hr at 1800 - providing 94g protein and 1792 kcal meeting 100% of patient's nutritional goals. Electrolytes in TPN: Na 3072mL, K 37m31m, Ca 0mEq6m Mg 3mEq/92mand Phos 18mmol19mMax Cl. Add standard MVI and trace elements to TPN Add thiamine '100mg'$  to TPN daily x7 days for high risk of re-feeding syndrome (remove from TPN on 10/16) Monitor TPN labs on Mon/Thurs, daily TPN labs until stable  Tracy Estrada, BCPS 12/18/2021 7:37 AM

## 2021-12-18 NOTE — Progress Notes (Signed)
[facility] Progress Note     Subjective: CC-  No new complaints.  Objective: Vital signs in last 24 hours: Temp:  [97.7 F (36.5 C)-98.7 F (37.1 C)] 98.1 F (36.7 C) (10/13 0757) Pulse Rate:  [93-108] 102 (10/13 0757) Resp:  [16-18] 16 (10/13 0757) BP: (107-135)/(66-76) 135/75 (10/13 0757) SpO2:  [92 %-96 %] 94 % (10/13 0757) Last BM Date : 12/16/21  Intake/Output from previous day: 10/12 0701 - 10/13 0700 In: 736.2 [I.V.:636.2; IV Piggyback:100] Out: -  Intake/Output this shift: Total I/O In: 120 [P.O.:120] Out: 25 [Drains:25]  PE: Gen:  Alert, NAD, pleasant Card:  RRR Pulm:  CTAB, no W/R/R, rate and effort normal Abd: Soft, ND, focally tender at drain site, JP with feculent material and cdi dressing  Lab Results:  No results for input(s): "WBC", "HGB", "HCT", "PLT" in the last 72 hours.  BMET Recent Labs    12/17/21 0402 12/18/21 0214  NA 139 141  K 4.1 4.0  CL 99 104  CO2 28 28  GLUCOSE 130* 131*  BUN 17 18  CREATININE 0.58 0.59  CALCIUM 10.1 9.9   PT/INR No results for input(s): "LABPROT", "INR" in the last 72 hours. CMP     Component Value Date/Time   NA 141 12/18/2021 0214   NA 141 04/07/2016 1348   K 4.0 12/18/2021 0214   CL 104 12/18/2021 0214   CO2 28 12/18/2021 0214   GLUCOSE 131 (H) 12/18/2021 0214   BUN 18 12/18/2021 0214   BUN 16 04/07/2016 1348   CREATININE 0.59 12/18/2021 0214   CREATININE 0.62 09/16/2015 1245   CALCIUM 9.9 12/18/2021 0214   PROT 6.2 (L) 12/17/2021 0402   PROT 6.7 04/07/2016 1348   ALBUMIN 2.9 (L) 12/17/2021 0402   ALBUMIN 4.5 04/07/2016 1348   AST 16 12/17/2021 0402   ALT 16 12/17/2021 0402   ALKPHOS 41 12/17/2021 0402   BILITOT 0.4 12/17/2021 0402   BILITOT 0.4 04/07/2016 1348   GFRNONAA >60 12/18/2021 0214   GFRAA >60 06/03/2016 1101   Lipase     Component Value Date/Time   LIPASE 23 12/11/2021 1644       Studies/Results: No results found.  Anti-infectives: Anti-infectives  (From admission, onward)    Start     Dose/Rate Route Frequency Ordered Stop   12/17/21 1400  piperacillin-tazobactam (ZOSYN) IVPB 3.375 g        3.375 g 12.5 mL/hr over 240 Minutes Intravenous Every 8 hours 12/17/21 1052     12/12/21 1400  piperacillin-tazobactam (ZOSYN) IVPB 3.375 g        3.375 g 12.5 mL/hr over 240 Minutes Intravenous Every 8 hours 12/12/21 0738 12/17/21 2011   12/12/21 0745  piperacillin-tazobactam (ZOSYN) IVPB 3.375 g        3.375 g 100 mL/hr over 30 Minutes Intravenous  Once 12/12/21 0740 12/12/21 0922   12/12/21 0300  cefTRIAXone (ROCEPHIN) 1 g in sodium chloride 0.9 % 100 mL IVPB        1 g 200 mL/hr over 30 Minutes Intravenous  Once 12/12/21 0252 12/12/21 0527        Assessment/Plan Acute on chronic sigmoid diverticulitis with perforation, colonic fistula with IR drain - Afebrile, VSS - No emergent surgical needs.  - Continue IV antibiotics and drain - She appears non-toxic but failed outpatient management and needs an operation which would be exploratory laparotomy, partial colectomy, and end colostomy. WOC RN marked her abdomen 10/9. Pre-albumin on 10/11 was 7 from 10. Ideally  her nutritional status could improve prior to surgery to decrease her risk of issues with wound healing, stump leak, etc post-operatively. Continue TPN and protein shakes for now with plans to recheck prealbumin early next week and hopefully OR next week if improving.   FEN: FLD, Ensure or Boost BID or TID. TPN. Repeat pre-albumin Monday ID: Zosyn, Klebsiella pneumoniae UTI and diverticulitis Dispo: Continue IV abx, FLD, TPN.    I reviewed last 24 h vitals and pain scores, last 48 h intake and output, last 24 h labs and trends, and last 24 h imaging results.    LOS: 6 days    [facility] 12/18/2021, 12:12 PM Please see Amion for pager number during day hours 7:00am-4:30pm

## 2021-12-18 NOTE — Progress Notes (Signed)
Nutrition Follow-up  DOCUMENTATION CODES:   Not applicable  INTERVENTION:  TPN management per pharmacy Discontinue Ensure Continue Boost Plus High protein from home as tolerated Boost Breeze po TID, each supplement provides 250 kcal and 9 grams of protein Magic cup TID with meals, each supplement provides 290 kcal and 9 grams of protein  NUTRITION DIAGNOSIS:   Moderate Malnutrition related to acute illness as evidenced by energy intake < 75% for > 7 days, percent weight loss, mild fat depletion, mild muscle depletion (5% wt loss in 2 weeks).  Ongoing  GOAL:   Patient will meet greater than or equal to 90% of their needs  Goal met via TPN  MONITOR:   PO intake, Labs, I & O's, Supplement acceptance  REASON FOR ASSESSMENT:   Consult New TPN/TNA  ASSESSMENT:   71 y.o. female presented to the ED with weakness, N/V, decreased appetite. PMH includes HLD, fibromyalgia, anxiety, and diverticulitis  w/ cutaneous drains in place. Pt admitted with sigmoid diverticulitis and UTI.  Noted ex-lap, partial colectomy, and end colostomy postponed to next week d/t pre-albumin labs decreasing.    Albumin has a half-life of 21 days and is strongly affected by stress response and inflammatory process, therefore, do not expect to see an improvement in this lab value during acute hospitalization. Discussed with Surgery.   TPN started 10/08. TPN at goal rate of 70 mL/hr at 1800 - providing 94g protein and 1792 kcal meeting 100% of patient's nutritional goals. Per Pharmacy, plans to cycle TPN once tolerating PO intake without nausea.   Spoke with pt via phone call to room. She reports feeling well today. She is not consuming anything on full liquid diet d/t nausea. She correlates this with excess Boost High Protein intake. She does not think she can drink anymore of these as she has had so many. She is agreeable to trailing Boost Breeze peach to add supplement variety. Will also add Magic Cup for  patient to try on meal trays.   Current weight is stable from admission.   Medications: protonix, IV abx  Labs: refeeding labs WDL, CBGs 119-183 x24 hours  LUQ JP drain: 77m x12 hours  Diet Order:   Diet Order             Diet full liquid Room service appropriate? Yes; Fluid consistency: Thin  Diet effective now                   EDUCATION NEEDS:   No education needs have been identified at this time  Skin:  Skin Assessment: Reviewed RN Assessment  Last BM:  10/11  Height:   Ht Readings from Last 1 Encounters:  12/17/21 5' (1.524 m)    Weight:   Wt Readings from Last 1 Encounters:  12/17/21 49.9 kg    Ideal Body Weight:  45.5 kg  BMI:  Body mass index is 21.48 kg/m.  Estimated Nutritional Needs:   Kcal:  1600-1800  Protein:  80-95 grams  Fluid:  >/= 1.6 L  AClayborne Dana RDN, LDN Clinical Nutrition

## 2021-12-18 NOTE — Progress Notes (Signed)
PROGRESS NOTE    Tracy Estrada  ENI:[id number] DOB: February 15, 1951 DOA: 12/11/2021 PCP: [name], NP   Brief Narrative:  Patient is a 71 year old female who presented to the ED with complaint of 2-week history of fatigue, chills, decreased appetite, vomiting.  She was admitted in April and was managed for perforated diverticulitis and was managed nonoperatively and eventually had IR drain placed, and was discharged.  Admitted for further management.  General surgery following.  Found to have acute on chronic sigmoid diverticulitis with perforation, persistent sigmoid colonic fistula.  Started on TPN, full liquid diet.  General surgery planning for Hartman's procedure this week.  Assessment & Plan:   Principal Problem:   Sigmoid diverticulitis Active Problems:   Urinary tract infection   Bilateral hydronephrosis   Anxiety and depression   Hyperlipidemia  Acute on chronic sigmoid diverticulitis with perforation/colonic fistula: Status post drain placement by IR.  Was managed conservatively with antibiotics.  Presented with fever, chills, fatigue.  Currently afebrile, no leukocytosis, hemodynamically stable.  CT imaging showed slight progression of inflammatory changes around the sigmoid colon compared to last one.  General surgery following.  Found to have acute on chronic sigmoid diverticulitis with perforation, persistent sigmoid colonic fistula.  Started on TPN, full liquid diet.  Underwent drain exchange by radiology on 12/15/2021.  General surgery planning for Hartman's procedure next week and allow her some time to have some nutrition. Currently on Zosyn.  Blood culture  sent.  No growth till date.   Urinary tract infection/bilateral hydronephrosis: UA was positive for leukocytes, nitrates, bacteria.  Patient was also having increased frequency.  CT showed mild bilateral hydronephrosis without signs of stricture or fistula.   urine culture showed Klebsiella which is pansensitive.    Already on Zosyn   Anxiety/ depression: On Wellbutrin   Hyperlipidemia: On pravastatin   Nutrition Problem: Moderate Malnutrition Etiology: acute illness.  She is on TPN and p.o. boost.  DVT prophylaxis: enoxaparin (LOVENOX) injection 40 mg Start: 12/12/21 2200   Code Status: Full Code  Family Communication:  None present at bedside.  Plan of care discussed with patient in length and he/she verbalized understanding and agreed with it.  Status is: Inpatient Remains inpatient appropriate because: General surgery planning for surgery next week.  Estimated body mass index is 21.48 kg/m as calculated from the following:   Height as of this encounter: 5' (1.524 m).   Weight as of this encounter: 49.9 kg.    Nutritional Assessment: Body mass index is 21.48 kg/m.Marland Kitchen Seen by dietician.  I agree with the assessment and plan as outlined below: Nutrition Status: Nutrition Problem: Moderate Malnutrition Etiology: acute illness Signs/Symptoms: energy intake < 75% for > 7 days, percent weight loss, mild fat depletion, mild muscle depletion (5% wt loss in 2 weeks) Percent weight loss: 5 % Interventions: TPN, Ensure Enlive (each supplement provides 350kcal and 20 grams of protein), MVI  . Skin Assessment: I have examined the patient's skin and I agree with the wound assessment as performed by the wound care RN as outlined below:    Consultants:  IR General surgery  Procedures:  As above  Antimicrobials:  Anti-infectives (From admission, onward)    Start     Dose/Rate Route Frequency Ordered Stop   12/17/21 1400  piperacillin-tazobactam (ZOSYN) IVPB 3.375 g        3.375 g 12.5 mL/hr over 240 Minutes Intravenous Every 8 hours 12/17/21 1052     12/12/21 1400  piperacillin-tazobactam (ZOSYN) IVPB 3.375 g  3.375 g 12.5 mL/hr over 240 Minutes Intravenous Every 8 hours 12/12/21 0738 12/17/21 2011   12/12/21 0745  piperacillin-tazobactam (ZOSYN) IVPB 3.375 g        3.375 g 100  mL/hr over 30 Minutes Intravenous  Once 12/12/21 0740 12/12/21 0922   12/12/21 0300  cefTRIAXone (ROCEPHIN) 1 g in sodium chloride 0.9 % 100 mL IVPB        1 g 200 mL/hr over 30 Minutes Intravenous  Once 12/12/21 0252 12/12/21 0527         Subjective:  Seen and examined.  No complaints.  Objective: Vitals:   12/17/21 1531 12/17/21 2139 12/18/21 0432 12/18/21 0757  BP: 117/76 107/66 129/70 135/75  Pulse: (!) 108 (!) 106 93 (!) 102  Resp: '17 18 18 16  '$ Temp: 98.7 F (37.1 C) 97.7 F (36.5 C) 97.7 F (36.5 C) 98.1 F (36.7 C)  TempSrc:  Oral Oral   SpO2: 96% 92% 95% 94%  Weight:      Height:        Intake/Output Summary (Last 24 hours) at 12/18/2021 1159 Last data filed at 12/18/2021 0930 Gross per 24 hour  Intake 856.16 ml  Output 25 ml  Net 831.16 ml    Filed Weights   12/11/21 1614 12/17/21 0900  Weight: 49.9 kg 49.9 kg    Examination:  General exam: Appears calm and comfortable  Respiratory system: Clear to auscultation. Respiratory effort normal. Cardiovascular system: S1 & S2 heard, RRR. No JVD, murmurs, rubs, gallops or clicks. No pedal edema. Gastrointestinal system: Abdomen is nondistended, soft and drainage left lower quadrant, tenderness moderate. No organomegaly or masses felt. Normal bowel sounds heard. Central nervous system: Alert and oriented. No focal neurological deficits. Extremities: Symmetric 5 x 5 power. Skin: No rashes, lesions or ulcers.  Psychiatry: Judgement and insight appear normal. Mood & affect appropriate.   Data Reviewed: I have personally reviewed following labs and imaging studies  CBC: Recent Labs  Lab 12/11/21 1644 12/11/21 1654 12/13/21 0231 12/15/21 0239  WBC 6.8  --  5.4 5.0  NEUTROABS 4.2  --   --   --   HGB 12.9 12.6 11.9* 11.0*  HCT 38.4 37.0 34.0* 33.3*  MCV 99.2  --  96.3 99.1  PLT 316  --  259 660    Basic Metabolic Panel: Recent Labs  Lab 12/14/21 0307 12/15/21 0239 12/16/21 0413 12/17/21 0402  12/18/21 0214  NA 137 136 137 139 141  K 3.2* 4.0 4.3 4.1 4.0  CL 103 100 101 99 104  CO2 '29 29 29 28 28  '$ GLUCOSE 147* 122* 135* 130* 131*  BUN '9 10 14 17 18  '$ CREATININE 0.63 0.54 0.63 0.58 0.59  CALCIUM 9.0 9.2 9.4 10.1 9.9  MG 2.2 2.1 2.3 2.2 2.3  PHOS 3.0 3.0 3.6 3.8 4.1    GFR: Estimated Creatinine Clearance: 45 mL/min (by C-G formula based on SCr of 0.59 mg/dL). Liver Function Tests: Recent Labs  Lab 12/11/21 1644 12/14/21 0307 12/17/21 0402  AST 22 13* 16  ALT '16 12 16  '$ ALKPHOS 54 39 41  BILITOT 0.2* 0.7 0.4  PROT 6.8 6.1* 6.2*  ALBUMIN 3.6 2.8* 2.9*    Recent Labs  Lab 12/11/21 1644  LIPASE 23    No results for input(s): "AMMONIA" in the last 168 hours. Coagulation Profile: No results for input(s): "INR", "PROTIME" in the last 168 hours. Cardiac Enzymes: No results for input(s): "CKTOTAL", "CKMB", "CKMBINDEX", "TROPONINI" in the last 168 hours. BNP (  last 3 results) No results for input(s): "PROBNP" in the last 8760 hours. HbA1C: No results for input(s): "HGBA1C" in the last 72 hours.  CBG: Recent Labs  Lab 12/16/21 2328 12/17/21 0556 12/17/21 1159 12/18/21 0125 12/18/21 0808  GLUCAP 183* 149* 119* 150* 140*    Lipid Profile: Recent Labs    12/17/21 0402  TRIG 89    Thyroid Function Tests: No results for input(s): "TSH", "T4TOTAL", "FREET4", "T3FREE", "THYROIDAB" in the last 72 hours. Anemia Panel: No results for input(s): "VITAMINB12", "FOLATE", "FERRITIN", "TIBC", "IRON", "RETICCTPCT" in the last 72 hours. Sepsis Labs: No results for input(s): "PROCALCITON", "LATICACIDVEN" in the last 168 hours.  Recent Results (from the past 240 hour(s))  Urine Culture     Status: Abnormal   Collection Time: 12/12/21 10:38 AM   Specimen: Urine, Clean Catch  Result Value Ref Range Status   Specimen Description URINE, CLEAN CATCH  Final   Special Requests   Final    NONE Performed at [facility], [address], [place], [place] [address]     Culture >=100,000 COLONIES/mL KLEBSIELLA PNEUMONIAE (A)  Final   Report Status 12/14/2021 FINAL  Final   Organism ID, Bacteria KLEBSIELLA PNEUMONIAE (A)  Final      Susceptibility   Klebsiella pneumoniae - MIC*    AMPICILLIN RESISTANT Resistant     CEFAZOLIN <=4 SENSITIVE Sensitive     CEFEPIME <=0.12 SENSITIVE Sensitive     CEFTRIAXONE <=0.25 SENSITIVE Sensitive     CIPROFLOXACIN <=0.25 SENSITIVE Sensitive     GENTAMICIN <=1 SENSITIVE Sensitive     IMIPENEM <=0.25 SENSITIVE Sensitive     NITROFURANTOIN 64 INTERMEDIATE Intermediate     TRIMETH/SULFA <=20 SENSITIVE Sensitive     AMPICILLIN/SULBACTAM <=2 SENSITIVE Sensitive     PIP/TAZO <=4 SENSITIVE Sensitive     * >=100,000 COLONIES/mL KLEBSIELLA PNEUMONIAE  Culture, blood (Routine X 2) w Reflex to ID Panel     Status: None (Preliminary result)   Collection Time: 12/13/21  8:33 AM   Specimen: BLOOD  Result Value Ref Range Status   Specimen Description BLOOD BLOOD RIGHT HAND  Final   Special Requests   Final    BOTTLES DRAWN AEROBIC AND ANAEROBIC Blood Culture results may not be optimal due to an inadequate volume of blood received in culture bottles   Culture   Final    NO GROWTH 4 DAYS Performed at [facility] [address] [place], [place] [address]    Report Status PENDING  Incomplete  Culture, blood (Routine X 2) w Reflex to ID Panel     Status: None (Preliminary result)   Collection Time: 12/13/21  8:33 AM   Specimen: BLOOD  Result Value Ref Range Status   Specimen Description BLOOD BLOOD RIGHT HAND  Final   Special Requests   Final    BOTTLES DRAWN AEROBIC AND ANAEROBIC Blood Culture results may not be optimal due to an inadequate volume of blood received in culture bottles   Culture   Final    NO GROWTH 4 DAYS Performed at [facility], [place] [address], [place], [place] [address]    Report Status PENDING  Incomplete     Radiology Studies: No results found.  Scheduled Meds:  buPROPion  300 mg  Oral Daily   busPIRone  5 mg Oral BID   Chlorhexidine Gluconate Cloth  6 each Topical Daily   enoxaparin (LOVENOX) injection  40 mg Subcutaneous Q24H   feeding supplement  237  mL Oral TID WC   lip balm   Topical BID   pantoprazole (PROTONIX) IV  40 mg Intravenous QHS   sodium chloride flush  10-40 mL Intracatheter Q12H   sodium chloride flush  3 mL Intravenous Q12H   Continuous Infusions:  ondansetron (ZOFRAN) IV     piperacillin-tazobactam (ZOSYN)  IV 3.375 g (12/18/21 0553)   TPN ADULT (ION) 70 mL/hr at 12/18/21 0342   TPN ADULT (ION)       LOS: 6 days   Darliss Cheney, MD [facility]  12/18/2021, 11:59 AM   *Please note that this is a verbal dictation therefore any spelling or grammatical errors are due to the "[employer]" system interpretation.  Please page via [place] and do not message via secure chat for urgent patient care matters. Secure chat can be used for non urgent patient care matters.  How to contact the [facility] Attending or Consulting provider [place] or covering provider during after hours [place], for this patient?  Check the care team in [facility] and look for a) attending/consulting [facility] provider listed and b) the [facility] team listed. Page or secure chat [phone]. Log into [email or web address] and use [place]'s universal password to access. If you do not have the password, please contact the hospital operator. Locate the [facility] provider you are looking for under [facility] and page to a number that you can be directly reached. If you still have difficulty reaching the provider, please page the [facility] (Director on Call) for the Hospitalists listed on amion for assistance.

## 2021-12-18 NOTE — Progress Notes (Signed)
Referring Physician(s): Dr. Georgette Dover  Supervising Physician: Ruthann Cancer  Patient Status:  [facility] - In-pt  Chief Complaint: LLQ diverticular drain with multiple exchanges since original placement 06/21/21 and most recently exchanged 12/15/21  Subjective: Patient in bed with husband at bedside. She denies pain/discomfort at this time.   Allergies: Demerol [meperidine], Kenalog [triamcinolone acetonide], and Adhesive [tape]  Medications: Prior to Admission medications   Medication Sig Start Date End Date Taking? Authorizing Provider  acetaminophen (TYLENOL) 325 MG tablet Take 650 mg by mouth every 6 (six) hours as needed for moderate pain or headache.   Yes [provider]  buPROPion (WELLBUTRIN XL) 150 MG 24 hr tablet Take 2 tablets (300 mg total) by mouth daily. For depression. 03/24/21  Yes [name], NP  busPIRone (BUSPAR) 5 MG tablet TAKE 1 TABLET (5 MG TOTAL) BY MOUTH 2 (TWO) TIMES DAILY. FOR ANXIETY 11/06/21  Yes [name], NP  Cholecalciferol (VITAMIN D3 PO) Take 1 tablet by mouth daily. Unsure of mg   Yes [provider]  fluticasone (FLONASE) 50 MCG/ACT nasal spray Place 2 sprays into the nose daily as needed for allergies.    Yes [provider]  Multiple Vitamins-Minerals (MULTIVITAMIN WITH MINERALS) tablet Take 1 tablet by mouth daily. Centrum Silver for Women   Yes [provider]  Omega-3 Fatty Acids (FISH OIL) 500 MG CAPS Take 500 mg by mouth daily.   Yes [provider]  POTASSIUM PO Take 99 mg by mouth daily.   Yes [provider]  pravastatin (PRAVACHOL) 40 MG tablet TAKE 1 TABLET BY MOUTH EVERY DAY FOR CHOLESTEROL Patient taking differently: Take 40 mg by mouth at bedtime. 05/17/21  Yes [name], NP  vitamin B-12 (CYANOCOBALAMIN) 1000 MCG tablet Take 1,000 mcg by mouth daily.   Yes [provider]     Vital Signs: BP 135/75 (BP Location: Left Arm)   Pulse (!) 102   Temp 98.1 F  (36.7 C)   Resp 16   Ht 5' (1.524 m)   Wt 110 lb 0.2 oz (49.9 kg)   SpO2 94%   BMI 21.48 kg/m   Physical Exam Constitutional:      General: She is not in acute distress.    Appearance: She is underweight.  Pulmonary:     Effort: Pulmonary effort is normal.  Abdominal:     Comments: LLQ drain to suction. Approximately 25 ml of brown fluid in bulb. Dressing is clean/dry/intact   Skin:    General: Skin is warm and dry.  Neurological:     Mental Status: She is alert and oriented to person, place, and time.     Imaging: IR Catheter Tube Change  Result Date: 12/16/2021 CLINICAL DATA:  Long-term sigmoid diverticular disease, post drain catheter placement 06/21/2021, subsequent revisions, now with leakage around catheter EXAM: [place]: The procedure, risks (including but not limited to bleeding, infection, organ damage), benefits, and alternatives were explained to the patient. Questions regarding the procedure were encouraged and answered. The patient understands and consents to the procedure. Drain catheter and surrounding skin prepped and draped in usual sterile fashion. Intravenous Fentanyl 64mg and Versed '1mg'$  were administered as conscious sedation during continuous monitoring of the patient's level of consciousness and physiological / cardiorespiratory status by the radiology RN, with a total moderate sedation time of 10 minutes. Survey fluoroscopic inspection reveals stable position of the left pelvic pigtail drain catheter. Injection demonstrates persistent  fistula to sigmoid colon. No significant abscess cavity is opacified. 1% lidocaine infiltrated locally around the drain catheter. The catheter was cut and exchanged over an Amplatz wire for a new 14 French pigtail catheter, formed centrally at the level of the previous device. Catheter injection shows continued flow into the lumen of the sigmoid colon. No abscess cavity  identified. Catheter secured externally 0 Prolene suture and StatLock and placed to suction drain bulb. The patient tolerated the procedure well. COMPLICATIONS: None immediate FLUOROSCOPY: Radiation Exposure Index (as provided by the fluoroscopic device): 3 mGy air Kerma IMPRESSION: Technically successful exchange and upsizing of the abscess drain catheter to a 14 [place] device. Electronically Signed   By: Lucrezia Europe M.D.   On: 12/16/2021 08:44    Labs:  CBC: Recent Labs    06/26/21 0259 12/11/21 1644 12/11/21 1654 12/13/21 0231 12/15/21 0239  WBC 3.4* 6.8  --  5.4 5.0  HGB 10.6* 12.9 12.6 11.9* 11.0*  HCT 32.3* 38.4 37.0 34.0* 33.3*  PLT 382 316  --  259 234    COAGS: Recent Labs    06/20/21 1417  INR 1.3*    BMP: Recent Labs    12/15/21 0239 12/16/21 0413 12/17/21 0402 12/18/21 0214  NA 136 137 139 141  K 4.0 4.3 4.1 4.0  CL 100 101 99 104  CO2 '29 29 28 28  '$ GLUCOSE 122* 135* 130* 131*  BUN '10 14 17 18  '$ CALCIUM 9.2 9.4 10.1 9.9  CREATININE 0.54 0.63 0.58 0.59  GFRNONAA >60 >60 >60 >60    LIVER FUNCTION TESTS: Recent Labs    06/21/21 0500 12/11/21 1644 12/14/21 0307 12/17/21 0402  BILITOT 0.8 0.2* 0.7 0.4  AST 12* 22 13* 16  ALT '9 16 12 16  '$ ALKPHOS 43 54 39 41  PROT 6.0* 6.8 6.1* 6.2*  ALBUMIN 2.6* 3.6 2.8* 2.9*    Assessment and Plan:  LLQ diverticular drain with multiple exchanges since original placement 06/21/21 and most recently exchanged 12/15/21  She is afebrile and without leukocytosis. Patient states Surgery is planning to take her to the OR once she has been medically optimized.   Drain Location: LLQ Size: Fr size: 14 Fr Date of placement: 12/15/21  Currently to: Drain collection device: suction bulb 24 hour output:  Output by Drain (mL) 12/16/21 0700 - 12/16/21 1459 12/16/21 1500 - 12/16/21 2259 12/16/21 2300 - 12/17/21 0659 12/17/21 0700 - 12/17/21 1459 12/17/21 1500 - 12/17/21 2259 12/17/21 2300 - 12/18/21 0659 12/18/21 0700 - 12/18/21  0953  Closed System Drain 1 Left;Anterior LLQ Bulb (JP) 14 Fr. 140 260 50        Interval imaging/drain manipulation:  None  Current examination:  Insertion site unremarkable. Suture and stat lock in place. Dressed appropriately.   Plan: Continue TID flushes with 5 cc NS. Record output Q shift. Dressing changes QD or PRN if soiled.  Call IR APP or on call IR MD if difficulty flushing or sudden change in drain output.  Repeat imaging/possible drain injection once output < 10 mL/QD (excluding flush material). Consideration for drain removal if output is < 10 mL/QD (excluding flush material), pending discussion with the providing surgical service.  Discharge planning: Please contact IR APP or on call IR MD prior to patient d/c to ensure appropriate follow up plans are in place. Typically patient will follow up with IR clinic 10-14 days post d/c for repeat imaging/possible drain injection. IR scheduler will contact patient with date/time of appointment. Patient will need  to flush drain QD with 5 cc NS, record output QD, dressing changes every 2-3 days or earlier if soiled.   IR will continue to follow - please call with questions or concerns.  Electronically Signed: Soyla Dryer, AGACNP-BC [phone] 12/18/2021, 9:48 AM   I spent a total of 15 Minutes at the the patient's bedside AND on the patient's hospital floor or unit, greater than 50% of which was counseling/coordinating care for LLQ drain.

## 2021-12-19 DIAGNOSIS — K5732 Diverticulitis of large intestine without perforation or abscess without bleeding: Secondary | ICD-10-CM | POA: Diagnosis not present

## 2021-12-19 LAB — GLUCOSE, CAPILLARY
Glucose-Capillary: 116 mg/dL — ABNORMAL HIGH (ref 70–99)
Glucose-Capillary: 143 mg/dL — ABNORMAL HIGH (ref 70–99)
Glucose-Capillary: 147 mg/dL — ABNORMAL HIGH (ref 70–99)

## 2021-12-19 LAB — BASIC METABOLIC PANEL
Anion gap: 8 (ref 5–15)
BUN: 19 mg/dL (ref 8–23)
CO2: 28 mmol/L (ref 22–32)
Calcium: 9.7 mg/dL (ref 8.9–10.3)
Chloride: 102 mmol/L (ref 98–111)
Creatinine, Ser: 0.58 mg/dL (ref 0.44–1.00)
GFR, Estimated: >60 mL/min (ref >60)
Glucose, Bld: 141 mg/dL — ABNORMAL HIGH (ref 70–99)
Potassium: 3.7 mmol/L (ref 3.5–5.1)
Sodium: 138 mmol/L (ref 135–145)

## 2021-12-19 LAB — PHOSPHORUS: Phosphorus: 3.6 mg/dL (ref 2.5–4.6)

## 2021-12-19 LAB — MAGNESIUM: Magnesium: 2 mg/dL (ref 1.7–2.4)

## 2021-12-19 MED ORDER — TRACE MINERALS CU-MN-SE-ZN 300-55-60-3000 MCG/ML IV SOLN
INTRAVENOUS | Status: AC
  Filled 2021-12-19: qty 627.2

## 2021-12-19 NOTE — Plan of Care (Signed)

## 2021-12-19 NOTE — Progress Notes (Signed)
Mobility Specialist Progress Note   12/19/21 0931  Mobility  Activity Ambulated with assistance in room;Transferred from bed to chair  Level of Assistance Contact guard assist, steadying assist  Assistive Device Front wheel walker  Distance Ambulated (ft) 15 ft  Range of Motion/Exercises Active;All extremities   Patient received in bed agreeable to participate in mobility. Reported feeling better than yesterday, no nausea or pain. Required minimal HHA to EOB from supine to sit + extra time. Stood and ambulated min guard with steady gait. Tolerated without complaint or incident. Was left in thge recliner with all needs met, call bell in reach.   Tracy Estrada, [place], [place]  INOMV:[phone] Office: [phone]

## 2021-12-19 NOTE — Progress Notes (Signed)
PROGRESS NOTE    Tracy Estrada  XBJ:[id number] DOB: 07-Jan-1951 DOA: 12/11/2021 PCP: [name], NP   Brief Narrative:  Patient is a 71 year old female who presented to the ED with complaint of 2-week history of fatigue, chills, decreased appetite, vomiting.  She was admitted in April and was managed for perforated diverticulitis and was managed nonoperatively and eventually had IR drain placed, and was discharged.  Admitted for further management.  General surgery following.  Found to have acute on chronic sigmoid diverticulitis with perforation, persistent sigmoid colonic fistula.  Started on TPN, full liquid diet.  General surgery planning for Hartman's procedure this week.  Assessment & Plan:   Principal Problem:   Sigmoid diverticulitis Active Problems:   Urinary tract infection   Bilateral hydronephrosis   Anxiety and depression   Hyperlipidemia  Acute on chronic sigmoid diverticulitis with perforation/colonic fistula: Status post drain placement by IR.  Was managed conservatively with antibiotics.  Presented with fever, chills, fatigue.  Currently afebrile, no leukocytosis, hemodynamically stable.  CT imaging showed slight progression of inflammatory changes around the sigmoid colon compared to last one.  General surgery following.  Found to have acute on chronic sigmoid diverticulitis with perforation, persistent sigmoid colonic fistula.  Started on TPN, full liquid diet.  Underwent drain exchange by radiology on 12/15/2021.  General surgery planning for Hartman's procedure next week and allow her some time to have some nutrition. Currently on Zosyn.  Blood culture  sent.  No growth till date.   Urinary tract infection/bilateral hydronephrosis: UA was positive for leukocytes, nitrates, bacteria.  Patient was also having increased frequency.  CT showed mild bilateral hydronephrosis without signs of stricture or fistula.   urine culture showed Klebsiella which is pansensitive.    Already on Zosyn   Anxiety/ depression: On Wellbutrin   Hyperlipidemia: On pravastatin   Nutrition Problem: Moderate Malnutrition Etiology: acute illness.  She is on TPN and p.o. boost.  DVT prophylaxis: enoxaparin (LOVENOX) injection 40 mg Start: 12/12/21 2200   Code Status: Full Code  Family Communication:  None present at bedside.  Plan of care discussed with patient in length and he/she verbalized understanding and agreed with it.  Status is: Inpatient Remains inpatient appropriate because: General surgery planning for surgery next week.  Estimated body mass index is 21.48 kg/m as calculated from the following:   Height as of this encounter: 5' (1.524 m).   Weight as of this encounter: 49.9 kg.    Nutritional Assessment: Body mass index is 21.48 kg/m.[place] Kitchen Seen by dietician.  I agree with the assessment and plan as outlined below: Nutrition Status: Nutrition Problem: Moderate Malnutrition Etiology: acute illness Signs/Symptoms: energy intake < 75% for > 7 days, percent weight loss, mild fat depletion, mild muscle depletion (5% wt loss in 2 weeks) Percent weight loss: 5 % Interventions: Boost Breeze, Magic cup  . Skin Assessment: I have examined the patient's skin and I agree with the wound assessment as performed by the wound care RN as outlined below:    Consultants:  IR General surgery  Procedures:  As above  Antimicrobials:  Anti-infectives (From admission, onward)    Start     Dose/Rate Route Frequency Ordered Stop   12/17/21 1400  piperacillin-tazobactam (ZOSYN) IVPB 3.375 g        3.375 g 12.5 mL/hr over 240 Minutes Intravenous Every 8 hours 12/17/21 1052     12/12/21 1400  piperacillin-tazobactam (ZOSYN) IVPB 3.375 g        3.375 g 12.5  mL/hr over 240 Minutes Intravenous Every 8 hours 12/12/21 0738 12/17/21 2011   12/12/21 0745  piperacillin-tazobactam (ZOSYN) IVPB 3.375 g        3.375 g 100 mL/hr over 30 Minutes Intravenous  Once 12/12/21 0740 12/12/21  0922   12/12/21 0300  cefTRIAXone (ROCEPHIN) 1 g in sodium chloride 0.9 % 100 mL IVPB        1 g 200 mL/hr over 30 Minutes Intravenous  Once 12/12/21 0252 12/12/21 0527         Subjective:  Patient seen and examined.  She has no complaints.  Appetite is improving.  Objective: Vitals:   12/18/21 0757 12/18/21 1932 12/19/21 0410 12/19/21 0822  BP: 135/75 123/65 139/68 137/67  Pulse: (!) 102  92 93  Resp: '16  18 20  '$ Temp: 98.1 F (36.7 C) 98 F (36.7 C) 98.2 F (36.8 C) 98.3 F (36.8 C)  TempSrc:  Oral Oral   SpO2: 94% 95% 98% 94%  Weight:      Height:        Intake/Output Summary (Last 24 hours) at 12/19/2021 1052 Last data filed at 12/19/2021 0827 Gross per 24 hour  Intake 2081.95 ml  Output 165 ml  Net 1916.95 ml    Filed Weights   12/11/21 1614 12/17/21 0900  Weight: 49.9 kg 49.9 kg    Examination:  General exam: Appears calm and comfortable  Respiratory system: Clear to auscultation. Respiratory effort normal. Cardiovascular system: S1 & S2 heard, RRR. No JVD, murmurs, rubs, gallops or clicks. No pedal edema. Gastrointestinal system: Abdomen is nondistended, soft and nontender. No organomegaly or masses felt. Normal bowel sounds heard. Central nervous system: Alert and oriented. No focal neurological deficits. Extremities: Symmetric 5 x 5 power. Skin: No rashes, lesions or ulcers.  Psychiatry: Judgement and insight appear normal. Mood & affect appropriate.    Data Reviewed: I have personally reviewed following labs and imaging studies  CBC: Recent Labs  Lab 12/13/21 0231 12/15/21 0239  WBC 5.4 5.0  HGB 11.9* 11.0*  HCT 34.0* 33.3*  MCV 96.3 99.1  PLT 259 628    Basic Metabolic Panel: Recent Labs  Lab 12/15/21 0239 12/16/21 0413 12/17/21 0402 12/18/21 0214 12/19/21 0310  NA 136 137 139 141 138  K 4.0 4.3 4.1 4.0 3.7  CL 100 101 99 104 102  CO2 '29 29 28 28 28  '$ GLUCOSE 122* 135* 130* 131* 141*  BUN '10 14 17 18 19  '$ CREATININE 0.54 0.63  0.58 0.59 0.58  CALCIUM 9.2 9.4 10.1 9.9 9.7  MG 2.1 2.3 2.2 2.3 2.0  PHOS 3.0 3.6 3.8 4.1 3.6    GFR: Estimated Creatinine Clearance: 45 mL/min (by C-G formula based on SCr of 0.58 mg/dL). Liver Function Tests: Recent Labs  Lab 12/14/21 0307 12/17/21 0402  AST 13* 16  ALT 12 16  ALKPHOS 39 41  BILITOT 0.7 0.4  PROT 6.1* 6.2*  ALBUMIN 2.8* 2.9*    No results for input(s): "LIPASE", "AMYLASE" in the last 168 hours.  No results for input(s): "AMMONIA" in the last 168 hours. Coagulation Profile: No results for input(s): "INR", "PROTIME" in the last 168 hours. Cardiac Enzymes: No results for input(s): "CKTOTAL", "CKMB", "CKMBINDEX", "TROPONINI" in the last 168 hours. BNP (last 3 results) No results for input(s): "PROBNP" in the last 8760 hours. HbA1C: No results for input(s): "HGBA1C" in the last 72 hours.  CBG: Recent Labs  Lab 12/17/21 1159 12/18/21 0125 12/18/21 0808 12/18/21 1519 12/19/21 0033  GLUCAP  119* 150* 140* 147* 143*    Lipid Profile: Recent Labs    12/17/21 0402  TRIG 89    Thyroid Function Tests: No results for input(s): "TSH", "T4TOTAL", "FREET4", "T3FREE", "THYROIDAB" in the last 72 hours. Anemia Panel: No results for input(s): "VITAMINB12", "FOLATE", "FERRITIN", "TIBC", "IRON", "RETICCTPCT" in the last 72 hours. Sepsis Labs: No results for input(s): "PROCALCITON", "LATICACIDVEN" in the last 168 hours.  Recent Results (from the past 240 hour(s))  Urine Culture     Status: Abnormal   Collection Time: 12/12/21 10:38 AM   Specimen: Urine, Clean Catch  Result Value Ref Range Status   Specimen Description URINE, CLEAN CATCH  Final   Special Requests   Final    NONE Performed at [facility], [address] [place], [place] [address]    Culture >=100,000 COLONIES/mL KLEBSIELLA PNEUMONIAE (A)  Final   Report Status 12/14/2021 FINAL  Final   Organism ID, Bacteria KLEBSIELLA PNEUMONIAE (A)  Final      Susceptibility   Klebsiella  pneumoniae - MIC*    AMPICILLIN RESISTANT Resistant     CEFAZOLIN <=4 SENSITIVE Sensitive     CEFEPIME <=0.12 SENSITIVE Sensitive     CEFTRIAXONE <=0.25 SENSITIVE Sensitive     CIPROFLOXACIN <=0.25 SENSITIVE Sensitive     GENTAMICIN <=1 SENSITIVE Sensitive     IMIPENEM <=0.25 SENSITIVE Sensitive     NITROFURANTOIN 64 INTERMEDIATE Intermediate     TRIMETH/SULFA <=20 SENSITIVE Sensitive     AMPICILLIN/SULBACTAM <=2 SENSITIVE Sensitive     PIP/TAZO <=4 SENSITIVE Sensitive     * >=100,000 COLONIES/mL KLEBSIELLA PNEUMONIAE  Culture, blood (Routine X 2) w Reflex to ID Panel     Status: None   Collection Time: 12/13/21  8:33 AM   Specimen: BLOOD  Result Value Ref Range Status   Specimen Description BLOOD BLOOD RIGHT HAND  Final   Special Requests   Final    BOTTLES DRAWN AEROBIC AND ANAEROBIC Blood Culture results may not be optimal due to an inadequate volume of blood received in culture bottles   Culture   Final    NO GROWTH 5 DAYS Performed at [facility], [address], [place], [place] [address]    Report Status 12/18/2021 FINAL  Final  Culture, blood (Routine X 2) w Reflex to ID Panel     Status: None   Collection Time: 12/13/21  8:33 AM   Specimen: BLOOD  Result Value Ref Range Status   Specimen Description BLOOD BLOOD RIGHT HAND  Final   Special Requests   Final    BOTTLES DRAWN AEROBIC AND ANAEROBIC Blood Culture results may not be optimal due to an inadequate volume of blood received in culture bottles   Culture   Final    NO GROWTH 5 DAYS Performed at [facility], [place] [address], [place], [place] [address]    Report Status 12/18/2021 FINAL  Final     Radiology Studies: No results found.  Scheduled Meds:  buPROPion  300 mg Oral Daily   busPIRone  5 mg Oral BID   Chlorhexidine Gluconate Cloth  6 each Topical Daily   enoxaparin (LOVENOX) injection  40 mg Subcutaneous Q24H   feeding supplement  1 Container Oral TID BM   lip balm   Topical BID    pantoprazole (PROTONIX) IV  40 mg Intravenous QHS   sodium chloride flush  10-40 mL Intracatheter Q12H   sodium chloride flush  3 mL Intravenous Q12H  sodium chloride flush  5 mL Intracatheter Q8H   Continuous Infusions:  ondansetron (ZOFRAN) IV     piperacillin-tazobactam (ZOSYN)  IV 3.375 g (12/19/21 0520)   TPN ADULT (ION) 70 mL/hr at 12/18/21 1740   TPN ADULT (ION)       LOS: 7 days   Darliss Cheney, MD [facility]  12/19/2021, 10:52 AM   *Please note that this is a verbal dictation therefore any spelling or grammatical errors are due to the "[place]" system interpretation.  Please page via [place] and do not message via secure chat for urgent patient care matters. Secure chat can be used for non urgent patient care matters.  How to contact the [facility] Attending or Consulting provider [place] or covering provider during after hours [place], for this patient?  Check the care team in [facility] and look for a) attending/consulting TR[facility] provider listed and b) the [facility] team listed. Page or secure chat [phone]. Log into [email or web address] and use [place]'s universal password to access. If you do not have the password, please contact the hospital operator. Locate the [facility] provider you are looking for under [facility] and page to a number that you can be directly reached. If you still have difficulty reaching the provider, please page the [facility] (Director on Call) for the Hospitalists listed on amion for assistance.

## 2021-12-19 NOTE — Progress Notes (Signed)
PHARMACY - TOTAL PARENTERAL NUTRITION CONSULT NOTE   Indication: complicated diverticulitis  Patient Measurements: Height: 5' (152.4 cm) Weight: 49.9 kg (110 lb 0.2 oz) IBW/kg (Calculated) : 45.5 TPN AdjBW (KG): 49.9 Body mass index is 21.48 kg/m. Usual Weight: 45 kg  Assessment:  71 yo F admitted on 10/7 who presented with weakness and 8lb weight loss over 2 weeks. Patient been eating "poorly" x~38monthat home with limited toleration d/t diverticulitis. (Has cutaneous drain in place since April 2023 for diverticulitis w/ abscess). Pharmacy has been consulted for TPN management to improve nutritional status in order to help stimulate positive post-op recovery.   Patient noted nausea after consuming Boost High Protein and Cookout milkshake on 10/12. She states she drank ginger ale, however unsure if she will be able to trial another Boost High Protein. Will plan to cycle TPN once tolerating PO intake without nausea.  Glucose / Insulin: CBG <150, CBG checks q12h, SSI discontinued 10/12, A1c 5.4% Electrolytes: K 4.0 > 3.7 trending down, others WNL Renal: Scr <1; BUN WNL - stable Hepatic: 10/12: Alb 2.9, prealbumin 7, LFTs WNL, TG 89 Intake / Output; MIVF: UOP: not accurately documented; LBM 10/13 (diarrhea per pt), 115 mL drain output 10/13  GI Imaging: 10/7 CTAP colon diverticulosis; no recurrent abscess; complicated acute on chronic sigmoid diverticulitis with progression since June GI Surgeries / Procedures:  10/10 LLQ abscess drain catheter exchange in IR Planning for GI operation- Hartman's procedure next week, pending repeat pre-albumin level   Central access: PICC (double-lumen) placed 10/8 TPN start date: 10/8  Nutritional Goals: Goal TPN rate is 70 mL/hr (provides 94 g of protein and 1792 kcals per day)  RD Assessment: Estimated Needs Total Energy Estimated Needs: 1600-1800 Total Protein Estimated Needs: 80-95 grams Total Fluid Estimated Needs: >/= 1.6 L  Current  Nutrition:  TPN + FLD Boost High Protein (as tolerated)  Plan:  Continue TPN at goal rate of 70 mL/hr at 1800 - providing 94g protein and 1792 kcal meeting 100% of patient's nutritional goals Electrolytes in TPN: Na 370m/L, increase K to 4564mL, Ca 0mE80m, Mg 3mEq29m and Phos 18mmo7m Max Cl Add standard MVI and trace elements to TPN Add thiamine '100mg'$  to TPN daily x7 days for high risk of re-feeding syndrome (remove from TPN on 10/16) Monitor TPN labs on Mon/Thurs, daily TPN labs until stable   Thank you for allowing pharmacy to be a part of this patient's care.  AdrienArdyth HarpsmD Clinical Pharmacist

## 2021-12-19 NOTE — Plan of Care (Signed)
  Problem: Pain Managment: Goal: General experience of comfort will improve Outcome: Progressing   Problem: Safety: Goal: Ability to remain free from injury will improve Outcome: Progressing   Problem: Skin Integrity: Goal: Risk for impaired skin integrity will decrease Outcome: Progressing   

## 2021-12-20 DIAGNOSIS — K5732 Diverticulitis of large intestine without perforation or abscess without bleeding: Secondary | ICD-10-CM | POA: Diagnosis not present

## 2021-12-20 LAB — PREALBUMIN: Prealbumin: 15 mg/dL — ABNORMAL LOW (ref 18–38)

## 2021-12-20 LAB — GLUCOSE, CAPILLARY
Glucose-Capillary: 129 mg/dL — ABNORMAL HIGH (ref 70–99)
Glucose-Capillary: 139 mg/dL — ABNORMAL HIGH (ref 70–99)

## 2021-12-20 MED ORDER — TRACE MINERALS CU-MN-SE-ZN 300-55-60-3000 MCG/ML IV SOLN
INTRAVENOUS | Status: AC
  Filled 2021-12-20: qty 627.2

## 2021-12-20 NOTE — Progress Notes (Signed)
Mobility Specialist Progress Note   12/20/21 1230  Mobility  Activity Ambulated with assistance to bathroom;Transferred from bed to chair (to chair after ambulation)  Level of Assistance Contact guard assist, steadying assist  Assistive Device Front wheel walker  Distance Ambulated (ft) 20 ft  Range of Motion/Exercises Active;All extremities  Activity Response Tolerated well   Patient received in supine eager to participate in mobility. Required minimal HHA to EOB from supine to sit. Stood and ambulated to and from bathroom, min guard with slow steady gait. Deferred further ambulation due to family visiting.Tolerated without complaint or incident. Was left in supine with all needs met, call bell in reach.    , CMS, BS EXP Acute Rehabilitation Services  Phone:[phone] Office: [phone]  

## 2021-12-20 NOTE — Plan of Care (Signed)
  Problem: Clinical Measurements: Goal: Will remain free from infection Outcome: Progressing   Problem: Activity: Goal: Risk for activity intolerance will decrease Outcome: Progressing   Problem: Nutrition: Goal: Adequate nutrition will be maintained Outcome: Progressing   Problem: Coping: Goal: Level of anxiety will decrease Outcome: Progressing   Problem: Pain Managment: Goal: General experience of comfort will improve Outcome: Progressing   

## 2021-12-20 NOTE — Progress Notes (Signed)
PHARMACY - TOTAL PARENTERAL NUTRITION CONSULT NOTE   Indication: complicated diverticulitis  Patient Measurements: Height: 5' (152.4 cm) Weight: 49.9 kg (110 lb 0.2 oz) IBW/kg (Calculated) : 45.5 TPN AdjBW (KG): 49.9 Body mass index is 21.48 kg/m. Usual Weight: 45 kg  Assessment:  71 yo F admitted on 10/7 who presented with weakness and 8lb weight loss over 2 weeks. Patient been eating "poorly" x~74monthat home with limited toleration d/t diverticulitis. (Has cutaneous drain in place since April 2023 for diverticulitis w/ abscess). Pharmacy has been consulted for TPN management to improve nutritional status in order to help stimulate positive post-op recovery.   Patient noted nausea after consuming Boost High Protein and Cookout milkshake on 10/12. She states she drank ginger ale, however unsure if she will be able to trial another Boost High Protein. Will plan to cycle TPN once tolerating PO intake without nausea.  Glucose / Insulin: CBG <150, CBG checks q12h, SSI discontinued 10/12, A1c 5.4% Electrolytes: K 4.0 > 3.7 trending down, others WNL Renal: Scr <1; BUN WNL - stable Hepatic: 10/12: Alb 2.9, prealbumin 7 > 15, LFTs WNL, TG 89 Intake / Output; MIVF: UOP: not accurately documented; LBM 10/14 (diarrhea per pt), 180 mL drain output  GI Imaging: 10/07 CTAP colon diverticulosis; no recurrent abscess; complicated acute on chronic sigmoid diverticulitis with progression since June GI Surgeries / Procedures:  10/10 LLQ abscess drain catheter exchange in IR Planning for GI operation- Hartman's procedure next week, pending repeat pre-albumin level (now up at 15)  Central access: PICC (double-lumen) placed 10/08 TPN start date: 10/08  Nutritional Goals: Goal TPN rate is 70 mL/hr (provides 94 g of protein and 1792 kcals per day)  RD Assessment: Estimated Needs Total Energy Estimated Needs: 1600-1800 Total Protein Estimated Needs: 80-95 grams Total Fluid Estimated Needs: >/= 1.6  L  Current Nutrition:  TPN + FLD Boost High Protein (as tolerated)  Plan:  Continue TPN at goal rate of 70 mL/hr at 1800 - providing 94g protein and 1792 kcal meeting 100% of patient's nutritional goals Electrolytes in TPN: Na 375m/L, increased K to 4556mL 10/14, Ca 0mE87m, Mg 3mEq67m and Phos 18mmo13m Max Cl Add standard MVI and trace elements to TPN Add thiamine '100mg'$  to TPN daily x7 days for high risk of re-feeding syndrome (remove from TPN on 10/16) Monitor TPN labs on Mon/Thurs, daily TPN labs until stable   Thank you for allowing pharmacy to be a part of this patient's care.  AdrienArdyth HarpsmD Clinical Pharmacist

## 2021-12-20 NOTE — Progress Notes (Signed)
PROGRESS NOTE    Tracy Estrada  ZLD:[id number] DOB: Aug 20, 1950 DOA: 12/11/2021 PCP: [name], NP   Brief Narrative:  Patient is a 71 year old female who presented to the ED with complaint of 2-week history of fatigue, chills, decreased appetite, vomiting.  She was admitted in April and was managed for perforated diverticulitis and was managed nonoperatively and eventually had IR drain placed, and was discharged.  Admitted for further management.  General surgery following.  Found to have acute on chronic sigmoid diverticulitis with perforation, persistent sigmoid colonic fistula.  Started on TPN, full liquid diet.  General surgery planning for Hartman's procedure this week.  Assessment & Plan:   Principal Problem:   Sigmoid diverticulitis Active Problems:   Urinary tract infection   Bilateral hydronephrosis   Anxiety and depression   Hyperlipidemia  Acute on chronic sigmoid diverticulitis with perforation/colonic fistula: Status post drain placement by IR.  Was managed conservatively with antibiotics.  Presented with fever, chills, fatigue.  Currently afebrile, no leukocytosis, hemodynamically stable.  CT imaging showed slight progression of inflammatory changes around the sigmoid colon compared to last one.  General surgery following.  Found to have acute on chronic sigmoid diverticulitis with perforation, persistent sigmoid colonic fistula.  Started on TPN, full liquid diet.  Underwent drain exchange by radiology on 12/15/2021.  General surgery planning for Hartman's procedure next week and allow her some time to have some nutrition. Currently on Zosyn.  Blood culture  sent.  No growth till date.   Urinary tract infection/bilateral hydronephrosis: UA was positive for leukocytes, nitrates, bacteria.  Patient was also having increased frequency.  CT showed mild bilateral hydronephrosis without signs of stricture or fistula.   urine culture showed Klebsiella which is pansensitive.    Already on Zosyn   Anxiety/ depression: On Wellbutrin   Hyperlipidemia: On pravastatin   Nutrition Problem: Moderate Malnutrition Etiology: acute illness.  She is on TPN and p.o. boost.  DVT prophylaxis: enoxaparin (LOVENOX) injection 40 mg Start: 12/12/21 2200   Code Status: Full Code  Family Communication:  None present at bedside.  Plan of care discussed with patient in length and he/she verbalized understanding and agreed with it.  Status is: Inpatient Remains inpatient appropriate because: General surgery planning for surgery next week.  Estimated body mass index is 21.48 kg/m as calculated from the following:   Height as of this encounter: 5' (1.524 m).   Weight as of this encounter: 49.9 kg.    Nutritional Assessment: Body mass index is 21.48 kg/m.Marland Kitchen Seen by dietician.  I agree with the assessment and plan as outlined below: Nutrition Status: Nutrition Problem: Moderate Malnutrition Etiology: acute illness Signs/Symptoms: energy intake < 75% for > 7 days, percent weight loss, mild fat depletion, mild muscle depletion (5% wt loss in 2 weeks) Percent weight loss: 5 % Interventions: Boost Breeze, Magic cup  . Skin Assessment: I have examined the patient's skin and I agree with the wound assessment as performed by the wound care RN as outlined below:    Consultants:  IR General surgery  Procedures:  As above  Antimicrobials:  Anti-infectives (From admission, onward)    Start     Dose/Rate Route Frequency Ordered Stop   12/17/21 1400  piperacillin-tazobactam (ZOSYN) IVPB 3.375 g        3.375 g 12.5 mL/hr over 240 Minutes Intravenous Every 8 hours 12/17/21 1052     12/12/21 1400  piperacillin-tazobactam (ZOSYN) IVPB 3.375 g        3.375 g 12.5  mL/hr over 240 Minutes Intravenous Every 8 hours 12/12/21 0738 12/17/21 2011   12/12/21 0745  piperacillin-tazobactam (ZOSYN) IVPB 3.375 g        3.375 g 100 mL/hr over 30 Minutes Intravenous  Once 12/12/21 0740 12/12/21  0922   12/12/21 0300  cefTRIAXone (ROCEPHIN) 1 g in sodium chloride 0.9 % 100 mL IVPB        1 g 200 mL/hr over 30 Minutes Intravenous  Once 12/12/21 0252 12/12/21 0527         Subjective:  Patient seen and examined.  She has no complaints.  No abdominal pain.  She said that her appetite is improving and she is trying to eat more nutritious food.  Objective: Vitals:   12/19/21 1252 12/19/21 1943 12/20/21 0338 12/20/21 0842  BP: 128/62 128/72 119/67 119/64  Pulse: 95 (!) 108 87 88  Resp: '19 17 18 14  '$ Temp: (!) 97.5 F (36.4 C) 98 F (36.7 C) 97.6 F (36.4 C) 98.6 F (37 C)  TempSrc:    Oral  SpO2: 97% 97% 91% 96%  Weight:      Height:        Intake/Output Summary (Last 24 hours) at 12/20/2021 1100 Last data filed at 12/20/2021 1005 Gross per 24 hour  Intake 378 ml  Output 170 ml  Net 208 ml    Filed Weights   12/11/21 1614 12/17/21 0900  Weight: 49.9 kg 49.9 kg    Examination:  General exam: Appears calm and comfortable  Respiratory system: Clear to auscultation. Respiratory effort normal. Cardiovascular system: S1 & S2 heard, RRR. No JVD, murmurs, rubs, gallops or clicks. No pedal edema. Gastrointestinal system: Abdomen is nondistended, soft and nontender. No organomegaly or masses felt. Normal bowel sounds heard. Central nervous system: Alert and oriented. No focal neurological deficits. Extremities: Symmetric 5 x 5 power. Skin: No rashes, lesions or ulcers.  Psychiatry: Judgement and insight appear normal. Mood & affect appropriate.   Data Reviewed: I have personally reviewed following labs and imaging studies  CBC: Recent Labs  Lab 12/15/21 0239  WBC 5.0  HGB 11.0*  HCT 33.3*  MCV 99.1  PLT 629    Basic Metabolic Panel: Recent Labs  Lab 12/15/21 0239 12/16/21 0413 12/17/21 0402 12/18/21 0214 12/19/21 0310  NA 136 137 139 141 138  K 4.0 4.3 4.1 4.0 3.7  CL 100 101 99 104 102  CO2 '29 29 28 28 28  '$ GLUCOSE 122* 135* 130* 131* 141*  BUN '10  14 17 18 19  '$ CREATININE 0.54 0.63 0.58 0.59 0.58  CALCIUM 9.2 9.4 10.1 9.9 9.7  MG 2.1 2.3 2.2 2.3 2.0  PHOS 3.0 3.6 3.8 4.1 3.6    GFR: Estimated Creatinine Clearance: 45 mL/min (by C-G formula based on SCr of 0.58 mg/dL). Liver Function Tests: Recent Labs  Lab 12/14/21 0307 12/17/21 0402  AST 13* 16  ALT 12 16  ALKPHOS 39 41  BILITOT 0.7 0.4  PROT 6.1* 6.2*  ALBUMIN 2.8* 2.9*    No results for input(s): "LIPASE", "AMYLASE" in the last 168 hours.  No results for input(s): "AMMONIA" in the last 168 hours. Coagulation Profile: No results for input(s): "INR", "PROTIME" in the last 168 hours. Cardiac Enzymes: No results for input(s): "CKTOTAL", "CKMB", "CKMBINDEX", "TROPONINI" in the last 168 hours. BNP (last 3 results) No results for input(s): "PROBNP" in the last 8760 hours. HbA1C: No results for input(s): "HGBA1C" in the last 72 hours.  CBG: Recent Labs  Lab 12/18/21 0125  12/18/21 0808 12/18/21 1519 12/19/21 0033 12/19/21 1254  GLUCAP 150* 140* 147* 143* 116*    Lipid Profile: No results for input(s): "CHOL", "HDL", "LDLCALC", "TRIG", "CHOLHDL", "LDLDIRECT" in the last 72 hours.  Thyroid Function Tests: No results for input(s): "TSH", "T4TOTAL", "FREET4", "T3FREE", "THYROIDAB" in the last 72 hours. Anemia Panel: No results for input(s): "VITAMINB12", "FOLATE", "FERRITIN", "TIBC", "IRON", "RETICCTPCT" in the last 72 hours. Sepsis Labs: No results for input(s): "PROCALCITON", "LATICACIDVEN" in the last 168 hours.  Recent Results (from the past 240 hour(s))  Urine Culture     Status: Abnormal   Collection Time: 12/12/21 10:38 AM   Specimen: Urine, Clean Catch  Result Value Ref Range Status   Specimen Description URINE, CLEAN CATCH  Final   Special Requests   Final    NONE Performed at [facility], [address] [place], [place] [address]    Culture >=100,000 COLONIES/mL KLEBSIELLA PNEUMONIAE (A)  Final   Report Status 12/14/2021 FINAL  Final    Organism ID, Bacteria KLEBSIELLA PNEUMONIAE (A)  Final      Susceptibility   Klebsiella pneumoniae - MIC*    AMPICILLIN RESISTANT Resistant     CEFAZOLIN <=4 SENSITIVE Sensitive     CEFEPIME <=0.12 SENSITIVE Sensitive     CEFTRIAXONE <=0.25 SENSITIVE Sensitive     CIPROFLOXACIN <=0.25 SENSITIVE Sensitive     GENTAMICIN <=1 SENSITIVE Sensitive     IMIPENEM <=0.25 SENSITIVE Sensitive     NITROFURANTOIN 64 INTERMEDIATE Intermediate     TRIMETH/SULFA <=20 SENSITIVE Sensitive     AMPICILLIN/SULBACTAM <=2 SENSITIVE Sensitive     PIP/TAZO <=4 SENSITIVE Sensitive     * >=100,000 COLONIES/mL KLEBSIELLA PNEUMONIAE  Culture, blood (Routine X 2) w Reflex to ID Panel     Status: None   Collection Time: 12/13/21  8:33 AM   Specimen: BLOOD  Result Value Ref Range Status   Specimen Description BLOOD BLOOD RIGHT HAND  Final   Special Requests   Final    BOTTLES DRAWN AEROBIC AND ANAEROBIC Blood Culture results may not be optimal due to an inadequate volume of blood received in culture bottles   Culture   Final    NO GROWTH 5 DAYS Performed at [facility], [address], [place], [place] [address]    Report Status 12/18/2021 FINAL  Final  Culture, blood (Routine X 2) w Reflex to ID Panel     Status: None   Collection Time: 12/13/21  8:33 AM   Specimen: BLOOD  Result Value Ref Range Status   Specimen Description BLOOD BLOOD RIGHT HAND  Final   Special Requests   Final    BOTTLES DRAWN AEROBIC AND ANAEROBIC Blood Culture results may not be optimal due to an inadequate volume of blood received in culture bottles   Culture   Final    NO GROWTH 5 DAYS Performed at [facility], [place] [address] [place], [place] [address]    Report Status 12/18/2021 FINAL  Final     Radiology Studies: No results found.  Scheduled Meds:  buPROPion  300 mg Oral Daily   busPIRone  5 mg Oral BID   Chlorhexidine Gluconate Cloth  6 each Topical Daily   enoxaparin (LOVENOX) injection  40 mg Subcutaneous  Q24H   feeding supplement  1 Container Oral TID BM   lip balm   Topical BID   pantoprazole (PROTONIX) IV  40 mg Intravenous QHS   sodium chloride flush  10-40 mL Intracatheter  Q12H   sodium chloride flush  3 mL Intravenous Q12H   sodium chloride flush  5 mL Intracatheter Q8H   Continuous Infusions:  ondansetron (ZOFRAN) IV     piperacillin-tazobactam (ZOSYN)  IV 3.375 g (12/20/21 0540)   TPN ADULT (ION) 70 mL/hr at 12/19/21 1759   TPN ADULT (ION)       LOS: 8 days   Darliss Cheney, MD [facility]  12/20/2021, 11:00 AM   *Please note that this is a verbal dictation therefore any spelling or grammatical errors are due to the "[place]" system interpretation.  Please page via [place] and do not message via secure chat for urgent patient care matters. Secure chat can be used for non urgent patient care matters.  How to contact the [facility] Attending or Consulting provider [place] or covering provider during after hours [place], for this patient?  Check the care team in [facility] and look for a) attending/consulting [facility] provider listed and b) the [facility] team listed. Page or secure chat [phone]. Log into [email or web address] and use [place]'s universal password to access. If you do not have the password, please contact the hospital operator. Locate the [facility] provider you are looking for under [facility] and page to a number that you can be directly reached. If you still have difficulty reaching the provider, please page the [facility] (Director on Call) for the Hospitalists listed on amion for assistance.

## 2021-12-20 NOTE — Progress Notes (Signed)
Patient ID: Tracy Estrada, female   DOB: 10-23-1950, 71 y.o.   MRN: [id number] [facility] Surgery Progress Note:   * No surgery found *   THE PLAN  Responding to treatment with Zosyn and TNA.  This is a recurrent problem and elective resection may need to be considered.    Subjective: Mental status is clear but patient doesn't have great insight into here diverticulitis.  Complaints pain in abdomen is gone. Objective: Vital signs in last 24 hours: Temp:  [97.5 F (36.4 C)-98.6 F (37 C)] 98.6 F (37 C) (10/15 0842) Pulse Rate:  [87-108] 88 (10/15 0842) Resp:  [14-19] 14 (10/15 0842) BP: (119-128)/(62-72) 119/64 (10/15 0842) SpO2:  [91 %-97 %] 96 % (10/15 0842)  Intake/Output from previous day: 10/14 0701 - 10/15 0700 In: 506 [P.O.:360; I.V.:36; IV Piggyback:100] Out: 180 [Drains:180] Intake/Output this shift: Total I/O In: 5 [I.V.:5] Out: 40 [Drains:40]  Physical Exam: Work of breathing is normal.  No appreciable abdominal pain  Lab Results:  Results for orders placed or performed during the hospital encounter of 12/11/21 (from the past 48 hour(s))  Glucose, capillary     Status: Abnormal   Collection Time: 12/18/21  3:19 PM  Result Value Ref Range   Glucose-Capillary 147 (H) 70 - 99 mg/dL    Comment: Glucose reference range applies only to samples taken after fasting for at least 8 hours.  Glucose, capillary     Status: Abnormal   Collection Time: 12/19/21 12:33 AM  Result Value Ref Range   Glucose-Capillary 143 (H) 70 - 99 mg/dL    Comment: Glucose reference range applies only to samples taken after fasting for at least 8 hours.  Basic metabolic panel     Status: Abnormal   Collection Time: 12/19/21  3:10 AM  Result Value Ref Range   Sodium 138 135 - 145 mmol/L   Potassium 3.7 3.5 - 5.1 mmol/L   Chloride 102 98 - 111 mmol/L   CO2 28 22 - 32 mmol/L   Glucose, Bld 141 (H) 70 - 99 mg/dL    Comment: Glucose reference range applies only to samples taken after  fasting for at least 8 hours.   BUN 19 8 - 23 mg/dL   Creatinine, Ser 0.58 0.44 - 1.00 mg/dL   Calcium 9.7 8.9 - 10.3 mg/dL   GFR, Estimated >60 >60 mL/min    Comment: (NOTE) Calculated using the CKD-EPI Creatinine Equation (2021)    Anion gap 8 5 - 15    Comment: Performed at [facility] [address] [place], [place] [address]  Magnesium     Status: None   Collection Time: 12/19/21  3:10 AM  Result Value Ref Range   Magnesium 2.0 1.7 - 2.4 mg/dL    Comment: Performed at [facility] [address] [place], [place] [address]  Phosphorus     Status: None   Collection Time: 12/19/21  3:10 AM  Result Value Ref Range   Phosphorus 3.6 2.5 - 4.6 mg/dL    Comment: Performed at [facility] [address], [place], [place] [address]  Glucose, capillary     Status: Abnormal   Collection Time: 12/19/21 12:54 PM  Result Value Ref Range   Glucose-Capillary 116 (H) 70 - 99 mg/dL    Comment: Glucose reference range applies only to samples taken after fasting for at least 8 hours.  Prealbumin     Status: Abnormal   Collection Time: 12/20/21  5:09  AM  Result Value Ref Range   Prealbumin 15 (L) 18 - 38 mg/dL    Comment: Performed at [facility], [place] [address] [place], [place] [address]    Radiology/Results: No results found.  Anti-infectives: Anti-infectives (From admission, onward)    Start     Dose/Rate Route Frequency Ordered Stop   12/17/21 1400  piperacillin-tazobactam (ZOSYN) IVPB 3.375 g        3.375 g 12.5 mL/hr over 240 Minutes Intravenous Every 8 hours 12/17/21 1052     12/12/21 1400  piperacillin-tazobactam (ZOSYN) IVPB 3.375 g        3.375 g 12.5 mL/hr over 240 Minutes Intravenous Every 8 hours 12/12/21 0738 12/17/21 2011   12/12/21 0745  piperacillin-tazobactam (ZOSYN) IVPB 3.375 g        3.375 g 100 mL/hr over 30 Minutes Intravenous  Once 12/12/21 0740 12/12/21 0922   12/12/21 0300  cefTRIAXone (ROCEPHIN) 1 g in sodium chloride 0.9 % 100  mL IVPB        1 g 200 mL/hr over 30 Minutes Intravenous  Once 12/12/21 0252 12/12/21 0527       Assessment/Plan: Problem List: Patient Active Problem List   Diagnosis Date Noted   Urinary tract infection 12/12/2021   Bilateral hydronephrosis 12/12/2021   Acute pain of left knee 11/02/2021   Memory changes 07/21/2021   Intra-abdominal abscess ([place]) 06/21/2021   Normocytic anemia 06/21/2021   Right pelvic adnexal fluid collection 06/21/2021   Sigmoid diverticulitis 06/20/2021   Neutropenia ([place]) 01/28/2020   Chronic right shoulder pain 01/28/2020   Foul smelling urine 09/04/2019   Medicare annual wellness visit, subsequent 01/25/2019   Hyperlipidemia 01/25/2019   Osteoporosis 01/25/2019   Rash and nonspecific skin eruption 04/13/2018   Fibromyalgia 09/16/2015   Anxiety and depression 09/16/2015   Insomnia 09/16/2015   Genetic testing 03/21/2015   Family history of breast cancer in sister 02/27/2015   Family history of ovarian cancer 02/27/2015   Family history of colon cancer 02/27/2015    Resolving diverticulitis * No surgery found *    LOS: 8 days   Matt B. Hassell Done, MD, [facility] [phone] to reach the surgeon on call.    12/20/2021 10:30 AM

## 2021-12-21 DIAGNOSIS — K5732 Diverticulitis of large intestine without perforation or abscess without bleeding: Secondary | ICD-10-CM | POA: Diagnosis not present

## 2021-12-21 LAB — COMPREHENSIVE METABOLIC PANEL
ALT: 16 U/L (ref 0–44)
AST: 17 U/L (ref 15–41)
Albumin: 2.9 g/dL — ABNORMAL LOW (ref 3.5–5.0)
Alkaline Phosphatase: 35 U/L — ABNORMAL LOW (ref 38–126)
Anion gap: 5 (ref 5–15)
BUN: 24 mg/dL — ABNORMAL HIGH (ref 8–23)
CO2: 28 mmol/L (ref 22–32)
Calcium: 9.5 mg/dL (ref 8.9–10.3)
Chloride: 105 mmol/L (ref 98–111)
Creatinine, Ser: 0.5 mg/dL (ref 0.44–1.00)
GFR, Estimated: >60 mL/min (ref >60)
Glucose, Bld: 149 mg/dL — ABNORMAL HIGH (ref 70–99)
Potassium: 4 mmol/L (ref 3.5–5.1)
Sodium: 138 mmol/L (ref 135–145)
Total Bilirubin: 0.3 mg/dL (ref 0.3–1.2)
Total Protein: 6 g/dL — ABNORMAL LOW (ref 6.5–8.1)

## 2021-12-21 LAB — TYPE AND SCREEN
ABO/RH(D): B POS
Antibody Screen: NEGATIVE

## 2021-12-21 LAB — GLUCOSE, CAPILLARY
Glucose-Capillary: 154 mg/dL — ABNORMAL HIGH (ref 70–99)
Glucose-Capillary: 154 mg/dL — ABNORMAL HIGH (ref 70–99)

## 2021-12-21 LAB — PHOSPHORUS: Phosphorus: 3 mg/dL (ref 2.5–4.6)

## 2021-12-21 LAB — TRIGLYCERIDES: Triglycerides: 103 mg/dL (ref <150)

## 2021-12-21 LAB — MAGNESIUM: Magnesium: 2.1 mg/dL (ref 1.7–2.4)

## 2021-12-21 MED ORDER — INSULIN ASPART 100 UNIT/ML IJ SOLN
0.0000 [IU] | INTRAMUSCULAR | Status: AC
  Administered 2021-12-22: 3 [IU] via SUBCUTANEOUS
  Administered 2021-12-23: 1 [IU] via SUBCUTANEOUS
  Administered 2021-12-23: 3 [IU] via SUBCUTANEOUS
  Administered 2021-12-24 (×2): 1 [IU] via SUBCUTANEOUS
  Administered 2021-12-24: 2 [IU] via SUBCUTANEOUS
  Administered 2021-12-25: 1 [IU] via SUBCUTANEOUS

## 2021-12-21 MED ORDER — TRACE MINERALS CU-MN-SE-ZN 300-55-60-3000 MCG/ML IV SOLN
INTRAVENOUS | Status: AC
  Filled 2021-12-21: qty 627.2

## 2021-12-21 NOTE — Progress Notes (Signed)
Mobility Specialist Progress Note   12/21/21 1020  Mobility  Activity Ambulated with assistance in hallway  Level of Assistance Minimal assist, patient does 75% or more  Assistive Device Front wheel walker  Distance Ambulated (ft) 80 ft  Activity Response Tolerated well  $Mobility charge 1 Mobility   Received pt in bed having no complaints and agreeable to mobility. Pt was asymptomatic throughout ambulation and returned to room w/o fault. Left in bed w/ call bell in reach and all needs met.  Holland Falling Mobility Specialist [facility] #:  [phone] Acute Rehab Office:  [phone]

## 2021-12-21 NOTE — Progress Notes (Signed)
PHARMACY - TOTAL PARENTERAL NUTRITION CONSULT NOTE   Indication: complicated diverticulitis  Patient Measurements: Height: 5' (152.4 cm) Weight: 49.9 kg (110 lb 0.2 oz) IBW/kg (Calculated) : 45.5 TPN AdjBW (KG): 49.9 Body mass index is 21.48 kg/m. Usual Weight: 45 kg  Assessment:  71 yo F admitted on 10/7 who presented with weakness and 8lb weight loss over 2 weeks. Patient been eating "poorly" x~77monthat home with limited toleration d/t diverticulitis. (Has cutaneous drain in place since April 2023 for diverticulitis w/ abscess). Pharmacy has been consulted for TPN management to improve nutritional status in order to help stimulate positive post-op recovery.   Patient switched Boost supplements to Premier Protein supplements and notes nausea/vomiting improved. She has also been able to tolerate sherbet ice cream.   Glucose / Insulin: CBG <150, CBG checks q12h, SSI discontinued 10/12, A1c 5.4% Electrolytes: Lytes WNL and stable Renal: Scr <1 stable, BUN slightly up (24) Hepatic: Alb 2.9, prealbumin up from 7 > 15, LFTs WNL, TG 103 Intake / Output; MIVF: UOP: not quantified but x 5 occurrences; LBM 10/15, 110 mL drain output - still reporting nausea/vomiting GI Imaging: 10/07 CTAP colon diverticulosis; no recurrent abscess; complicated acute on chronic sigmoid diverticulitis with progression since June GI Surgeries / Procedures:  10/10 LLQ abscess drain catheter exchange in IR Planning for GI operation- Hartman's procedure this week, pending repeat pre-albumin level (now up to 15)  Central access: PICC (double-lumen) placed 10/08 TPN start date: 10/08  Nutritional Goals: Goal TPN rate is 70 mL/hr (provides 94 g of protein and 1792 kcals per day)  RD Assessment: Estimated Needs Total Energy Estimated Needs: 1600-1800 Total Protein Estimated Needs: 80-95 grams Total Fluid Estimated Needs: >/= 1.6 L  Current Nutrition:  TPN + FLD Premier protein shakes  Plan:  Begin cycle  TPN over 18 hours this evening (GIR 2.37-4.27; providing 94g protein, meeting 100% of patients needs) - At 1800, begin TPN at 49 mL/hr for 1 hour, then increase to 99 mL/hr x 16 hours, then at 1100 on 10/17 reduce TPN to 49 mL/hr x 1 hour and stop Electrolytes in TPN: Na 362m/L, K 45103mL, Ca 0mE23m, Mg 3mEq29m and Phos 18mmo70m Max Cl Add standard MVI and trace elements to TPN Resume vsSSI and CBG checks QID while on cycle TPN (0800, 1300, 1700, 2000) Monitor TPN labs on Mon/Thurs, daily TPN labs until stable   Thank you for allowing pharmacy to be a part of this patient's care.  Mary-ADimple NanasmD, BCPS 12/21/2021 8:20 AM

## 2021-12-21 NOTE — Plan of Care (Signed)

## 2021-12-21 NOTE — Progress Notes (Signed)
PROGRESS NOTE    Tracy Estrada  ZYS:[id number] DOB: 05-20-1950 DOA: 12/11/2021 PCP: [name], NP   Brief Narrative:  Patient is a 71 year old female who presented to the ED with complaint of 2-week history of fatigue, chills, decreased appetite, vomiting.  She was admitted in April and was managed for perforated diverticulitis and was managed nonoperatively and eventually had IR drain placed, and was discharged.  Admitted for further management.  General surgery following.  Found to have acute on chronic sigmoid diverticulitis with perforation, persistent sigmoid colonic fistula.  Started on TPN, full liquid diet.  General surgery planning for Hartman's procedure this week.  Assessment & Plan:   Principal Problem:   Sigmoid diverticulitis Active Problems:   Urinary tract infection   Bilateral hydronephrosis   Anxiety and depression   Hyperlipidemia  Acute on chronic sigmoid diverticulitis with perforation/colonic fistula: Status post drain placement by IR.  Was managed conservatively with antibiotics.  Presented with fever, chills, fatigue.  Currently afebrile, no leukocytosis, hemodynamically stable.  CT imaging showed slight progression of inflammatory changes around the sigmoid colon compared to last one.  General surgery following.  Found to have acute on chronic sigmoid diverticulitis with perforation, persistent sigmoid colonic fistula.  Started on TPN, full liquid diet.  Underwent drain exchange by radiology on 12/15/2021.  General surgery planning for colectomy and colostomy tomorrow.  Continue Zosyn.   Urinary tract infection/bilateral hydronephrosis: UA was positive for leukocytes, nitrates, bacteria.  Patient was also having increased frequency.  CT showed mild bilateral hydronephrosis without signs of stricture or fistula.   urine culture showed Klebsiella which is pansensitive.   Already on Zosyn   Anxiety/ depression: On Wellbutrin   Hyperlipidemia: On pravastatin    Nutrition Problem: Moderate Malnutrition Etiology: acute illness.  She is on TPN and p.o. boost.  DVT prophylaxis: enoxaparin (LOVENOX) injection 40 mg Start: 12/12/21 2200   Code Status: Full Code  Family Communication:  None present at bedside.  Plan of care discussed with patient in length and he/she verbalized understanding and agreed with it.  Status is: Inpatient Remains inpatient appropriate because: General surgery planning for surgery tomorrow.  Estimated body mass index is 21.48 kg/m as calculated from the following:   Height as of this encounter: 5' (1.524 m).   Weight as of this encounter: 49.9 kg.    Nutritional Assessment: Body mass index is 21.48 kg/m.Marland Kitchen Seen by dietician.  I agree with the assessment and plan as outlined below: Nutrition Status: Nutrition Problem: Moderate Malnutrition Etiology: acute illness Signs/Symptoms: energy intake < 75% for > 7 days, percent weight loss, mild fat depletion, mild muscle depletion (5% wt loss in 2 weeks) Percent weight loss: 5 % Interventions: Boost Breeze, Magic cup  . Skin Assessment: I have examined the patient's skin and I agree with the wound assessment as performed by the wound care RN as outlined below:    Consultants:  IR General surgery  Procedures:  As above  Antimicrobials:  Anti-infectives (From admission, onward)    Start     Dose/Rate Route Frequency Ordered Stop   12/17/21 1400  piperacillin-tazobactam (ZOSYN) IVPB 3.375 g        3.375 g 12.5 mL/hr over 240 Minutes Intravenous Every 8 hours 12/17/21 1052     12/12/21 1400  piperacillin-tazobactam (ZOSYN) IVPB 3.375 g        3.375 g 12.5 mL/hr over 240 Minutes Intravenous Every 8 hours 12/12/21 0738 12/17/21 2011   12/12/21 0745  piperacillin-tazobactam (ZOSYN) IVPB  3.375 g        3.375 g 100 mL/hr over 30 Minutes Intravenous  Once 12/12/21 0740 12/12/21 0922   12/12/21 0300  cefTRIAXone (ROCEPHIN) 1 g in sodium chloride 0.9 % 100 mL IVPB         1 g 200 mL/hr over 30 Minutes Intravenous  Once 12/12/21 0252 12/12/21 0527         Subjective:  Patient seen and examined.  No complaints.  Objective: Vitals:   12/20/21 1626 12/20/21 2039 12/21/21 0540 12/21/21 0822  BP: 132/77 120/68 130/62 (!) 132/104  Pulse: 95 98 87 88  Resp: '16 15 16 17  '$ Temp: 97.8 F (36.6 C) 98.3 F (36.8 C)  98.5 F (36.9 C)  TempSrc: Oral   Oral  SpO2: 100% 94% 96% 94%  Weight:      Height:        Intake/Output Summary (Last 24 hours) at 12/21/2021 1230 Last data filed at 12/21/2021 0900 Gross per 24 hour  Intake 1774.66 ml  Output 85 ml  Net 1689.66 ml    Filed Weights   12/11/21 1614 12/17/21 0900  Weight: 49.9 kg 49.9 kg    Examination:  General exam: Appears calm and comfortable  Respiratory system: Clear to auscultation. Respiratory effort normal. Cardiovascular system: S1 & S2 heard, RRR. No JVD, murmurs, rubs, gallops or clicks. No pedal edema. Gastrointestinal system: Abdomen is nondistended, soft and nontender. No organomegaly or masses felt. Normal bowel sounds heard. Central nervous system: Alert and oriented. No focal neurological deficits. Extremities: Symmetric 5 x 5 power. Skin: No rashes, lesions or ulcers.  Psychiatry: Judgement and insight appear normal. Mood & affect appropriate.   Data Reviewed: I have personally reviewed following labs and imaging studies  CBC: Recent Labs  Lab 12/15/21 0239  WBC 5.0  HGB 11.0*  HCT 33.3*  MCV 99.1  PLT 300    Basic Metabolic Panel: Recent Labs  Lab 12/16/21 0413 12/17/21 0402 12/18/21 0214 12/19/21 0310 12/21/21 0424  NA 137 139 141 138 138  K 4.3 4.1 4.0 3.7 4.0  CL 101 99 104 102 105  CO2 '29 28 28 28 28  '$ GLUCOSE 135* 130* 131* 141* 149*  BUN '14 17 18 19 '$ 24*  CREATININE 0.63 0.58 0.59 0.58 0.50  CALCIUM 9.4 10.1 9.9 9.7 9.5  MG 2.3 2.2 2.3 2.0 2.1  PHOS 3.6 3.8 4.1 3.6 3.0    GFR: Estimated Creatinine Clearance: 45 mL/min (by C-G formula based on  SCr of 0.5 mg/dL). Liver Function Tests: Recent Labs  Lab 12/17/21 0402 12/21/21 0424  AST 16 17  ALT 16 16  ALKPHOS 41 35*  BILITOT 0.4 0.3  PROT 6.2* 6.0*  ALBUMIN 2.9* 2.9*    No results for input(s): "LIPASE", "AMYLASE" in the last 168 hours.  No results for input(s): "AMMONIA" in the last 168 hours. Coagulation Profile: No results for input(s): "INR", "PROTIME" in the last 168 hours. Cardiac Enzymes: No results for input(s): "CKTOTAL", "CKMB", "CKMBINDEX", "TROPONINI" in the last 168 hours. BNP (last 3 results) No results for input(s): "PROBNP" in the last 8760 hours. HbA1C: No results for input(s): "HGBA1C" in the last 72 hours.  CBG: Recent Labs  Lab 12/19/21 0033 12/19/21 1254 12/20/21 1216 12/20/21 2040 12/21/21 0008  GLUCAP 143* 116* 129* 139* 154*    Lipid Profile: Recent Labs    12/21/21 0424  TRIG 103    Thyroid Function Tests: No results for input(s): "TSH", "T4TOTAL", "FREET4", "T3FREE", "THYROIDAB" in the  last 72 hours. Anemia Panel: No results for input(s): "VITAMINB12", "FOLATE", "FERRITIN", "TIBC", "IRON", "RETICCTPCT" in the last 72 hours. Sepsis Labs: No results for input(s): "PROCALCITON", "LATICACIDVEN" in the last 168 hours.  Recent Results (from the past 240 hour(s))  Urine Culture     Status: Abnormal   Collection Time: 12/12/21 10:38 AM   Specimen: Urine, Clean Catch  Result Value Ref Range Status   Specimen Description URINE, CLEAN CATCH  Final   Special Requests   Final    NONE Performed at [facility], [address], [place], [place] [address]    Culture >=100,000 COLONIES/mL KLEBSIELLA PNEUMONIAE (A)  Final   Report Status 12/14/2021 FINAL  Final   Organism ID, Bacteria KLEBSIELLA PNEUMONIAE (A)  Final      Susceptibility   Klebsiella pneumoniae - MIC*    AMPICILLIN RESISTANT Resistant     CEFAZOLIN <=4 SENSITIVE Sensitive     CEFEPIME <=0.12 SENSITIVE Sensitive     CEFTRIAXONE <=0.25 SENSITIVE Sensitive      CIPROFLOXACIN <=0.25 SENSITIVE Sensitive     GENTAMICIN <=1 SENSITIVE Sensitive     IMIPENEM <=0.25 SENSITIVE Sensitive     NITROFURANTOIN 64 INTERMEDIATE Intermediate     TRIMETH/SULFA <=20 SENSITIVE Sensitive     AMPICILLIN/SULBACTAM <=2 SENSITIVE Sensitive     PIP/TAZO <=4 SENSITIVE Sensitive     * >=100,000 COLONIES/mL KLEBSIELLA PNEUMONIAE  Culture, blood (Routine X 2) w Reflex to ID Panel     Status: None   Collection Time: 12/13/21  8:33 AM   Specimen: BLOOD  Result Value Ref Range Status   Specimen Description BLOOD BLOOD RIGHT HAND  Final   Special Requests   Final    BOTTLES DRAWN AEROBIC AND ANAEROBIC Blood Culture results may not be optimal due to an inadequate volume of blood received in culture bottles   Culture   Final    NO GROWTH 5 DAYS Performed at [facility], [address] [place], [place] [address]    Report Status 12/18/2021 FINAL  Final  Culture, blood (Routine X 2) w Reflex to ID Panel     Status: None   Collection Time: 12/13/21  8:33 AM   Specimen: BLOOD  Result Value Ref Range Status   Specimen Description BLOOD BLOOD RIGHT HAND  Final   Special Requests   Final    BOTTLES DRAWN AEROBIC AND ANAEROBIC Blood Culture results may not be optimal due to an inadequate volume of blood received in culture bottles   Culture   Final    NO GROWTH 5 DAYS Performed at [facility], [place] [address], [place], [place] [address]    Report Status 12/18/2021 FINAL  Final     Radiology Studies: No results found.  Scheduled Meds:  buPROPion  300 mg Oral Daily   busPIRone  5 mg Oral BID   Chlorhexidine Gluconate Cloth  6 each Topical Daily   enoxaparin (LOVENOX) injection  40 mg Subcutaneous Q24H   feeding supplement  1 Container Oral TID BM   insulin aspart  0-6 Units Subcutaneous 4 times per day   lip balm   Topical BID   pantoprazole (PROTONIX) IV  40 mg Intravenous QHS   sodium chloride flush  10-40 mL Intracatheter Q12H   sodium chloride flush  3  mL Intravenous Q12H   sodium chloride flush  5 mL Intracatheter Q8H   Continuous Infusions:  ondansetron (ZOFRAN) IV     piperacillin-tazobactam (ZOSYN)  IV 3.375  g (12/21/21 0550)   TPN ADULT (ION) 70 mL/hr at 24/09/73 5329   TPN CYCLIC-ADULT (ION)       LOS: 9 days   Darliss Cheney, MD [facility]  12/21/2021, 12:30 PM   *Please note that this is a verbal dictation therefore any spelling or grammatical errors are due to the "[place] [employer]" system interpretation.  Please page via [place] and do not message via secure chat for urgent patient care matters. Secure chat can be used for non urgent patient care matters.  How to contact the [facility] Attending or Consulting provider [place] or covering provider during after hours [place], for this patient?  Check the care team in [facility] and look for a) attending/consulting [facility] provider listed and b) the [facility] team listed. Page or secure chat [phone]. Log into [email or web address] and use [place]'s universal password to access. If you do not have the password, please contact the hospital operator. Locate the [facility] provider you are looking for under [facility] and page to a number that you can be directly reached. If you still have difficulty reaching the provider, please page the [facility] (Director on Call) for the Hospitalists listed on amion for assistance.

## 2021-12-21 NOTE — Progress Notes (Signed)
   Subjective/Chief Complaint: She reports minimal abdominal pain this morning   Objective: Vital signs in last 24 hours: Temp:  [97.8 F (36.6 C)-98.5 F (36.9 C)] 98.5 F (36.9 C) (10/16 0822) Pulse Rate:  [87-98] 88 (10/16 0822) Resp:  [15-17] 17 (10/16 0822) BP: (120-132)/(62-104) 132/104 (10/16 0822) SpO2:  [94 %-100 %] 94 % (10/16 0822) Last BM Date : 12/20/21  Intake/Output from previous day: 10/15 0701 - 10/16 0700 In: 1904.7 [P.O.:240; I.V.:1494.7; IV Piggyback:150] Out: 95 [Drains:95] Intake/Output this shift: Total I/O In: -  Out: 30 [Drains:30]  Exam: Awake and alert Abdomen soft with LLQ tenderness, drain output consistent with fistula  Lab Results:  No results for input(s): "WBC", "HGB", "HCT", "PLT" in the last 72 hours. BMET Recent Labs    12/19/21 0310 12/21/21 0424  NA 138 138  K 3.7 4.0  CL 102 105  CO2 28 28  GLUCOSE 141* 149*  BUN 19 24*  CREATININE 0.58 0.50  CALCIUM 9.7 9.5   PT/INR No results for input(s): "LABPROT", "INR" in the last 72 hours. ABG No results for input(s): "PHART", "HCO3" in the last 72 hours.  Invalid input(s): "PCO2", "PO2"  Studies/Results: No results found.  Anti-infectives: Anti-infectives (From admission, onward)    Start     Dose/Rate Route Frequency Ordered Stop   12/17/21 1400  piperacillin-tazobactam (ZOSYN) IVPB 3.375 g        3.375 g 12.5 mL/hr over 240 Minutes Intravenous Every 8 hours 12/17/21 1052     12/12/21 1400  piperacillin-tazobactam (ZOSYN) IVPB 3.375 g        3.375 g 12.5 mL/hr over 240 Minutes Intravenous Every 8 hours 12/12/21 0738 12/17/21 2011   12/12/21 0745  piperacillin-tazobactam (ZOSYN) IVPB 3.375 g        3.375 g 100 mL/hr over 30 Minutes Intravenous  Once 12/12/21 0740 12/12/21 0922   12/12/21 0300  cefTRIAXone (ROCEPHIN) 1 g in sodium chloride 0.9 % 100 mL IVPB        1 g 200 mL/hr over 30 Minutes Intravenous  Once 12/12/21 0252 12/12/21 0527        Assessment/Plan: Acute on chronic sigmoid diverticulitis with perforation, colonic fistula with IR drain   Clinically, she continues to improve.  Her prealbumin has increased to 15 on TPN and protein shakes.  I believe she is now ready for surgery tomorrow.  I discussed this with her and her daughter at the bedside.  A partial colectomy and colostomy is recommended.  I explained the procedure to them in detail.  We discussed the risks which includes but are not limited to bleeding, infection, due to surrounding structures, the need for further procedures, cardiopulmonary issues, DVT, the need for further drains, etc.  She and her daughter understand and agree to proceed with surgery which will be scheduled for tomorrow   Tracy Estrada 12/21/2021

## 2021-12-21 NOTE — Progress Notes (Signed)
Referring Physician(s): Dr. Georgette Dover  Supervising Physician: Jacqulynn Cadet  Patient Status:  [facility] - In-pt  Chief Complaint: LLQ diverticular drain with multiple exchanges since original placement 06/21/21 and most recently exchanged 12/15/21  Subjective:   Patient in bed, NAD. Family member at bedside.  Patient is scheduled for surgery tomorrow.    Allergies: Demerol [meperidine], Kenalog [triamcinolone acetonide], and Adhesive [tape]  Medications: Prior to Admission medications   Medication Sig Start Date End Date Taking? Authorizing Provider  acetaminophen (TYLENOL) 325 MG tablet Take 650 mg by mouth every 6 (six) hours as needed for moderate pain or headache.   Yes [provider]  buPROPion (WELLBUTRIN XL) 150 MG 24 hr tablet Take 2 tablets (300 mg total) by mouth daily. For depression. 03/24/21  Yes [name], NP  busPIRone (BUSPAR) 5 MG tablet TAKE 1 TABLET (5 MG TOTAL) BY MOUTH 2 (TWO) TIMES DAILY. FOR ANXIETY 11/06/21  Yes [name], NP  Cholecalciferol (VITAMIN D3 PO) Take 1 tablet by mouth daily. Unsure of mg   Yes [provider]  fluticasone (FLONASE) 50 MCG/ACT nasal spray Place 2 sprays into the nose daily as needed for allergies.    Yes [provider]  Multiple Vitamins-Minerals (MULTIVITAMIN WITH MINERALS) tablet Take 1 tablet by mouth daily. Centrum Silver for Women   Yes [provider]  Omega-3 Fatty Acids (FISH OIL) 500 MG CAPS Take 500 mg by mouth daily.   Yes [provider]  POTASSIUM PO Take 99 mg by mouth daily.   Yes [provider]  pravastatin (PRAVACHOL) 40 MG tablet TAKE 1 TABLET BY MOUTH EVERY DAY FOR CHOLESTEROL Patient taking differently: Take 40 mg by mouth at bedtime. 05/17/21  Yes [name], NP  vitamin B-12 (CYANOCOBALAMIN) 1000 MCG tablet Take 1,000 mcg by mouth daily.   Yes [provider]     Vital Signs: BP 135/75 (BP Location: Left Arm)   Pulse (!)  102   Temp 98.1 F (36.7 C)   Resp 16   Ht 5' (1.524 m)   Wt 110 lb 0.2 oz (49.9 kg)   SpO2 94%   BMI 21.48 kg/m   Physical Exam Constitutional:      General: She is not in acute distress.    Appearance: She is underweight.  Pulmonary:     Effort: Pulmonary effort is normal.  Abdominal:     Comments: LLQ drain to suction. Approximately 15 ml of brown fluid in bulb. Site and dressing is clean/dry/intact. F/A well. Drain tubing was clogged, flushed at bedside and functioning well.  Skin:    General: Skin is warm and dry.  Neurological:     Mental Status: She is alert and oriented to person, place, and time.  Psychiatric:        Mood and Affect: Mood normal.        Behavior: Behavior normal.     Imaging: IR Catheter Tube Change  Result Date: 12/16/2021 CLINICAL DATA:  Long-term sigmoid diverticular disease, post drain catheter placement 06/21/2021, subsequent revisions, now with leakage around catheter EXAM: [place]: The procedure, risks (including but not limited to bleeding, infection, organ damage), benefits, and alternatives were explained to the patient. Questions regarding the procedure were encouraged and answered. The patient understands and consents to the procedure. Drain catheter and surrounding skin prepped and draped in usual sterile fashion. Intravenous Fentanyl 44mg and Versed '1mg'$  were administered as conscious sedation during  continuous monitoring of the patient's level of consciousness and physiological / cardiorespiratory status by the radiology RN, with a total moderate sedation time of 10 minutes. Survey fluoroscopic inspection reveals stable position of the left pelvic pigtail drain catheter. Injection demonstrates persistent fistula to sigmoid colon. No significant abscess cavity is opacified. 1% lidocaine infiltrated locally around the drain catheter. The catheter was cut and exchanged over an  Amplatz wire for a new 14 French pigtail catheter, formed centrally at the level of the previous device. Catheter injection shows continued flow into the lumen of the sigmoid colon. No abscess cavity identified. Catheter secured externally 0 Prolene suture and StatLock and placed to suction drain bulb. The patient tolerated the procedure well. COMPLICATIONS: None immediate FLUOROSCOPY: Radiation Exposure Index (as provided by the fluoroscopic device): 3 mGy air Kerma IMPRESSION: Technically successful exchange and upsizing of the abscess drain catheter to a 14 [place] device. Electronically Signed   By: Lucrezia Europe M.D.   On: 12/16/2021 08:44    Labs:  CBC: Recent Labs    06/26/21 0259 12/11/21 1644 12/11/21 1654 12/13/21 0231 12/15/21 0239  WBC 3.4* 6.8  --  5.4 5.0  HGB 10.6* 12.9 12.6 11.9* 11.0*  HCT 32.3* 38.4 37.0 34.0* 33.3*  PLT 382 316  --  259 234    COAGS: Recent Labs    06/20/21 1417  INR 1.3*    BMP: Recent Labs    12/15/21 0239 12/16/21 0413 12/17/21 0402 12/18/21 0214  NA 136 137 139 141  K 4.0 4.3 4.1 4.0  CL 100 101 99 104  CO2 '29 29 28 28  '$ GLUCOSE 122* 135* 130* 131*  BUN '10 14 17 18  '$ CALCIUM 9.2 9.4 10.1 9.9  CREATININE 0.54 0.63 0.58 0.59  GFRNONAA >60 >60 >60 >60    LIVER FUNCTION TESTS: Recent Labs    06/21/21 0500 12/11/21 1644 12/14/21 0307 12/17/21 0402  BILITOT 0.8 0.2* 0.7 0.4  AST 12* 22 13* 16  ALT '9 16 12 16  '$ ALKPHOS 43 54 39 41  PROT 6.0* 6.8 6.1* 6.2*  ALBUMIN 2.6* 3.6 2.8* 2.9*    Assessment and Plan:  LLQ diverticular drain with multiple exchanges since original placement 06/21/21 and most recently exchanged 12/15/21  Tubing was clogged, tubing cleaned out and draining functioning well.  Site c/d/I.  Op 30 mL, feculent - pt scheduled for surgery tomorrow.  Drain Location: LLQ Size: Fr size: 14 Fr Date of placement: 12/15/21   Currently to: Drain collection device: suction bulb 24 hour output:  Output by Drain (mL)  12/19/21 0701 - 12/19/21 1900 12/19/21 1901 - 12/20/21 0700 12/20/21 0701 - 12/20/21 1900 12/20/21 1901 - 12/21/21 0700 12/21/21 0701 - 12/21/21 1543  Closed System Drain 1 Left;Anterior LLQ Bulb (JP) 14 Fr. 110 70 65 30 30    Interval imaging/drain manipulation:  None   Current examination: Flushes/aspirates easily.  Insertion site unremarkable. Suture and stat lock in place. Dressed appropriately.   Plan: Continue TID flushes with 5 cc NS. Record output Q shift. Dressing changes QD or PRN if soiled.  Call IR APP or on call IR MD if difficulty flushing or sudden change in drain output.  Repeat imaging/possible drain injection once output < 10 mL/QD (excluding flush material). Consideration for drain removal if output is < 10 mL/QD (excluding flush material), pending discussion with the providing surgical service.  Discharge planning: Please contact IR APP or on call IR MD prior to patient d/c to ensure appropriate follow  up plans are in place. Typically patient will follow up with IR clinic 10-14 days post d/c for repeat imaging/possible drain injection. IR scheduler will contact patient with date/time of appointment. Patient will need to flush drain QD with 5 cc NS, record output QD, dressing changes every 2-3 days or earlier if soiled.   IR will continue to follow - please call with questions or concerns.   She is afebrile and without leukocytosis. Patient states Surgery is planning to take her to the OR once she has been medically optimized.   12/21/2021, 3:43 PM   I spent a total of 15 Minutes at the the patient's bedside AND on the patient's hospital floor or unit, greater than 50% of which was counseling/coordinating care for LLQ drain.

## 2021-12-22 ENCOUNTER — Telehealth: Payer: Self-pay

## 2021-12-22 ENCOUNTER — Inpatient Hospital Stay (HOSPITAL_COMMUNITY): Payer: Medicare Other | Admitting: Anesthesiology

## 2021-12-22 ENCOUNTER — Encounter (HOSPITAL_COMMUNITY)
Admission: EM | Disposition: A | Discharge status: Home Health | Payer: Self-pay | Source: Home / Self Care | Attending: Family Medicine

## 2021-12-22 ENCOUNTER — Other Ambulatory Visit: Payer: Self-pay

## 2021-12-22 DIAGNOSIS — K5732 Diverticulitis of large intestine without perforation or abscess without bleeding: Secondary | ICD-10-CM

## 2021-12-22 DIAGNOSIS — K632 Fistula of intestine: Secondary | ICD-10-CM

## 2021-12-22 DIAGNOSIS — K631 Perforation of intestine (nontraumatic): Secondary | ICD-10-CM

## 2021-12-22 DIAGNOSIS — M797 Fibromyalgia: Secondary | ICD-10-CM

## 2021-12-22 HISTORY — PX: BOWEL RESECTION: SHX1257

## 2021-12-22 LAB — SURGICAL PCR SCREEN
MRSA, PCR: NEGATIVE
Staphylococcus aureus: NEGATIVE

## 2021-12-22 LAB — GLUCOSE, CAPILLARY
Glucose-Capillary: 223 mg/dL — ABNORMAL HIGH (ref 70–99)
Glucose-Capillary: 153 mg/dL — ABNORMAL HIGH (ref 70–99)
Glucose-Capillary: 142 mg/dL — ABNORMAL HIGH (ref 70–99)
Glucose-Capillary: 269 mg/dL — ABNORMAL HIGH (ref 70–99)

## 2021-12-22 SURGERY — EXCISION, SMALL INTESTINE
Anesthesia: General | Site: Abdomen

## 2021-12-22 MED ORDER — PROPOFOL 10 MG/ML IV BOLUS
INTRAVENOUS | Status: AC
  Filled 2021-12-22: qty 20

## 2021-12-22 MED ORDER — DEXAMETHASONE SODIUM PHOSPHATE 10 MG/ML IJ SOLN
INTRAMUSCULAR | Status: DC | PRN
  Administered 2021-12-22: 5 mg via INTRAVENOUS

## 2021-12-22 MED ORDER — ONDANSETRON HCL 4 MG/2ML IJ SOLN
INTRAMUSCULAR | Status: DC | PRN
  Administered 2021-12-22: 4 mg via INTRAVENOUS

## 2021-12-22 MED ORDER — PHENYLEPHRINE 80 MCG/ML (10ML) SYRINGE FOR IV PUSH (FOR BLOOD PRESSURE SUPPORT)
PREFILLED_SYRINGE | INTRAVENOUS | Status: DC | PRN
  Administered 2021-12-22: 160 ug via INTRAVENOUS

## 2021-12-22 MED ORDER — FENTANYL CITRATE (PF) 100 MCG/2ML IJ SOLN
25.0000 ug | INTRAMUSCULAR | Status: DC | PRN
  Administered 2021-12-22 (×2): 50 ug via INTRAVENOUS

## 2021-12-22 MED ORDER — ROCURONIUM BROMIDE 10 MG/ML (PF) SYRINGE
PREFILLED_SYRINGE | INTRAVENOUS | Status: DC | PRN
  Administered 2021-12-22: 50 mg via INTRAVENOUS
  Administered 2021-12-22: 20 mg via INTRAVENOUS

## 2021-12-22 MED ORDER — NALOXONE HCL 0.4 MG/ML IJ SOLN: 0.4000 mg | INTRAMUSCULAR | Status: DC | PRN

## 2021-12-22 MED ORDER — ROCURONIUM BROMIDE 10 MG/ML (PF) SYRINGE
PREFILLED_SYRINGE | INTRAVENOUS | Status: AC
  Filled 2021-12-22: qty 10

## 2021-12-22 MED ORDER — ALVIMOPAN 12 MG PO CAPS
12.0000 mg | ORAL_CAPSULE | ORAL | Status: AC
  Administered 2021-12-22: 12 mg via ORAL
  Filled 2021-12-22: qty 1

## 2021-12-22 MED ORDER — PROPOFOL 500 MG/50ML IV EMUL
INTRAVENOUS | Status: DC | PRN
  Administered 2021-12-22: 25 ug/kg/min via INTRAVENOUS

## 2021-12-22 MED ORDER — KETAMINE HCL-SODIUM CHLORIDE 100-0.9 MG/10ML-% IV SOSY
PREFILLED_SYRINGE | INTRAVENOUS | Status: DC | PRN
  Administered 2021-12-22: 10 mg via INTRAVENOUS
  Administered 2021-12-22: 20 mg via INTRAVENOUS

## 2021-12-22 MED ORDER — ENOXAPARIN SODIUM 40 MG/0.4ML IJ SOSY
40.0000 mg | PREFILLED_SYRINGE | INTRAMUSCULAR | Status: DC
  Administered 2021-12-23 – 2021-12-29 (×7): 40 mg via SUBCUTANEOUS
  Filled 2021-12-22 (×7): qty 0.4

## 2021-12-22 MED ORDER — PROPOFOL 10 MG/ML IV BOLUS
INTRAVENOUS | Status: DC | PRN
  Administered 2021-12-22: 30 mg via INTRAVENOUS
  Administered 2021-12-22: 120 mg via INTRAVENOUS

## 2021-12-22 MED ORDER — DIPHENHYDRAMINE HCL 50 MG/ML IJ SOLN: 12.5000 mg | Freq: Four times a day (QID) | INTRAMUSCULAR | Status: DC | PRN

## 2021-12-22 MED ORDER — SUCCINYLCHOLINE CHLORIDE 200 MG/10ML IV SOSY
PREFILLED_SYRINGE | INTRAVENOUS | Status: DC | PRN
  Administered 2021-12-22: 100 mg via INTRAVENOUS

## 2021-12-22 MED ORDER — DEXMEDETOMIDINE HCL IN NACL 80 MCG/20ML IV SOLN
INTRAVENOUS | Status: DC | PRN
  Administered 2021-12-22: 8 ug via BUCCAL

## 2021-12-22 MED ORDER — LIDOCAINE 2% (20 MG/ML) 5 ML SYRINGE
INTRAMUSCULAR | Status: AC
  Filled 2021-12-22: qty 5

## 2021-12-22 MED ORDER — 0.9 % SODIUM CHLORIDE (POUR BTL) OPTIME
TOPICAL | Status: DC | PRN
  Administered 2021-12-22 (×3): 1000 mL

## 2021-12-22 MED ORDER — PHENYLEPHRINE HCL-NACL 20-0.9 MG/250ML-% IV SOLN
INTRAVENOUS | Status: DC | PRN
  Administered 2021-12-22: 25 ug/min via INTRAVENOUS

## 2021-12-22 MED ORDER — MORPHINE SULFATE 1 MG/ML IV SOLN PCA
INTRAVENOUS | Status: DC
  Administered 2021-12-23: 2 mg via INTRAVENOUS
  Administered 2021-12-23: 1 mg via INTRAVENOUS
  Administered 2021-12-23: 13 mg via INTRAVENOUS
  Administered 2021-12-23: 7 mg via INTRAVENOUS
  Administered 2021-12-24: 10 mg via INTRAVENOUS
  Administered 2021-12-24: 5 mg via INTRAVENOUS
  Filled 2021-12-22 (×3): qty 30

## 2021-12-22 MED ORDER — ALBUMIN HUMAN 5 % IV SOLN: INTRAVENOUS | Status: DC | PRN

## 2021-12-22 MED ORDER — DEXAMETHASONE SODIUM PHOSPHATE 10 MG/ML IJ SOLN
INTRAMUSCULAR | Status: AC
  Filled 2021-12-22: qty 1

## 2021-12-22 MED ORDER — LIDOCAINE 2% (20 MG/ML) 5 ML SYRINGE
INTRAMUSCULAR | Status: DC | PRN
  Administered 2021-12-22: 60 mg via INTRAVENOUS

## 2021-12-22 MED ORDER — ONDANSETRON HCL 4 MG/2ML IJ SOLN
4.0000 mg | Freq: Four times a day (QID) | INTRAMUSCULAR | Status: DC | PRN
  Administered 2021-12-23: 4 mg via INTRAVENOUS
  Filled 2021-12-22: qty 2

## 2021-12-22 MED ORDER — ALVIMOPAN 12 MG PO CAPS
12.0000 mg | ORAL_CAPSULE | Freq: Two times a day (BID) | ORAL | Status: DC
  Administered 2021-12-23 – 2021-12-27 (×9): 12 mg via ORAL
  Filled 2021-12-22 (×12): qty 1

## 2021-12-22 MED ORDER — ONDANSETRON HCL 4 MG/2ML IJ SOLN
INTRAMUSCULAR | Status: AC
  Filled 2021-12-22: qty 2

## 2021-12-22 MED ORDER — DIPHENHYDRAMINE HCL 12.5 MG/5ML PO ELIX: 12.5000 mg | ORAL_SOLUTION | Freq: Four times a day (QID) | ORAL | Status: DC | PRN

## 2021-12-22 MED ORDER — SUGAMMADEX SODIUM 200 MG/2ML IV SOLN
INTRAVENOUS | Status: DC | PRN
  Administered 2021-12-22: 200 mg via INTRAVENOUS

## 2021-12-22 MED ORDER — AMISULPRIDE (ANTIEMETIC) 5 MG/2ML IV SOLN: 10.0000 mg | Freq: Once | INTRAVENOUS | Status: DC | PRN

## 2021-12-22 MED ORDER — KETAMINE HCL 50 MG/5ML IJ SOSY
PREFILLED_SYRINGE | INTRAMUSCULAR | Status: AC
  Filled 2021-12-22: qty 5

## 2021-12-22 MED ORDER — FENTANYL CITRATE (PF) 100 MCG/2ML IJ SOLN
INTRAMUSCULAR | Status: AC
  Filled 2021-12-22: qty 2

## 2021-12-22 MED ORDER — FENTANYL CITRATE (PF) 250 MCG/5ML IJ SOLN
INTRAMUSCULAR | Status: DC | PRN
  Administered 2021-12-22 (×4): 50 ug via INTRAVENOUS

## 2021-12-22 MED ORDER — LACTATED RINGERS IV SOLN: INTRAVENOUS | Status: DC | PRN

## 2021-12-22 MED ORDER — FENTANYL CITRATE (PF) 250 MCG/5ML IJ SOLN
INTRAMUSCULAR | Status: AC
  Filled 2021-12-22: qty 5

## 2021-12-22 MED ORDER — SODIUM CHLORIDE 0.9% FLUSH: 9.0000 mL | INTRAVENOUS | Status: DC | PRN

## 2021-12-22 MED ORDER — TRACE MINERALS CU-MN-SE-ZN 300-55-60-3000 MCG/ML IV SOLN
INTRAVENOUS | Status: AC
  Filled 2021-12-22: qty 627.2

## 2021-12-22 MED ORDER — PROPOFOL 1000 MG/100ML IV EMUL
INTRAVENOUS | Status: AC
  Filled 2021-12-22: qty 100

## 2021-12-22 SURGICAL SUPPLY — 53 items
BAG COUNTER SPONGE SURGICOUNT (BAG) ×1 IMPLANT
BIOPATCH RED 1 DISK 7.0 (GAUZE/BANDAGES/DRESSINGS) IMPLANT
BLADE CLIPPER SURG (BLADE) IMPLANT
BNDG GAUZE DERMACEA FLUFF 4 (GAUZE/BANDAGES/DRESSINGS) IMPLANT
CANISTER SUCT 3000ML PPV (MISCELLANEOUS) ×1 IMPLANT
CHLORAPREP W/TINT 26 (MISCELLANEOUS) ×1 IMPLANT
COVER SURGICAL LIGHT HANDLE (MISCELLANEOUS) ×1 IMPLANT
DRAIN CHANNEL 19F RND (DRAIN) IMPLANT
DRAPE LAPAROSCOPIC ABDOMINAL (DRAPES) ×1 IMPLANT
DRAPE WARM FLUID 44X44 (DRAPES) ×1 IMPLANT
DRSG MEPILEX BORDER 4X12 (GAUZE/BANDAGES/DRESSINGS) IMPLANT
DRSG MEPILEX BORDER 4X8 (GAUZE/BANDAGES/DRESSINGS) IMPLANT
DRSG TEGADERM 4X4.75 (GAUZE/BANDAGES/DRESSINGS) IMPLANT
ELECT CAUTERY BLADE 6.4 (BLADE) ×2 IMPLANT
ELECT REM PT RETURN 9FT ADLT (ELECTROSURGICAL) ×1
ELECTRODE REM PT RTRN 9FT ADLT (ELECTROSURGICAL) ×1 IMPLANT
EVACUATOR SILICONE 100CC (DRAIN) IMPLANT
GAUZE SPONGE 4X4 12PLY STRL (GAUZE/BANDAGES/DRESSINGS) ×1 IMPLANT
GLOVE SURG SIGNA 7.5 PF LTX (GLOVE) ×2 IMPLANT
GOWN STRL REUS W/ TWL LRG LVL3 (GOWN DISPOSABLE) ×2 IMPLANT
GOWN STRL REUS W/ TWL XL LVL3 (GOWN DISPOSABLE) ×1 IMPLANT
GOWN STRL REUS W/TWL LRG LVL3 (GOWN DISPOSABLE) ×2
GOWN STRL REUS W/TWL XL LVL3 (GOWN DISPOSABLE) ×1
HANDLE SUCTION POOLE (INSTRUMENTS) ×1 IMPLANT
KIT BASIN OR (CUSTOM PROCEDURE TRAY) ×1 IMPLANT
KIT COLOSTOMY ILEOSTOMY 4 (WOUND CARE) IMPLANT
KIT TURNOVER KIT B (KITS) ×1 IMPLANT
LIGASURE IMPACT 36 18CM CVD LR (INSTRUMENTS) ×1 IMPLANT
NS IRRIG 1000ML POUR BTL (IV SOLUTION) ×2 IMPLANT
PACK GENERAL/GYN (CUSTOM PROCEDURE TRAY) ×1 IMPLANT
PAD ARMBOARD 7.5X6 YLW CONV (MISCELLANEOUS) ×1 IMPLANT
RELOAD PROXIMATE 75MM BLUE (ENDOMECHANICALS) ×1 IMPLANT
RELOAD STAPLE 75 3.8 BLU REG (ENDOMECHANICALS) IMPLANT
SPECIMEN JAR LARGE (MISCELLANEOUS) ×1 IMPLANT
SPONGE T-LAP 18X18 ~~LOC~~+RFID (SPONGE) IMPLANT
STAPLER CVD CUT BL 40 RELOAD (ENDOMECHANICALS) ×1 IMPLANT
STAPLER CVD CUT BLU 40 RELOAD (ENDOMECHANICALS) IMPLANT
STAPLER PROXIMATE 75MM BLUE (STAPLE) IMPLANT
STAPLER VISISTAT 35W (STAPLE) ×1 IMPLANT
SUCTION POOLE HANDLE (INSTRUMENTS) ×1
SUT PDS AB 1 TP1 96 (SUTURE) ×2 IMPLANT
SUT PROLENE 2 0 CT2 30 (SUTURE) IMPLANT
SUT SILK 2 0 SH CR/8 (SUTURE) ×1 IMPLANT
SUT SILK 2 0 TIES 10X30 (SUTURE) ×1 IMPLANT
SUT SILK 3 0 SH CR/8 (SUTURE) ×1 IMPLANT
SUT SILK 3 0 TIES 10X30 (SUTURE) ×1 IMPLANT
SUT VIC AB 3-0 SH 18 (SUTURE) IMPLANT
TAPE CLOTH SURG 4X10 WHT LF (GAUZE/BANDAGES/DRESSINGS) IMPLANT
TAPE PAPER 3X10 WHT MICROPORE (GAUZE/BANDAGES/DRESSINGS) IMPLANT
TOWEL GREEN STERILE (TOWEL DISPOSABLE) ×1 IMPLANT
TOWEL GREEN STERILE FF (TOWEL DISPOSABLE) ×1 IMPLANT
TRAY FOLEY MTR SLVR 14FR STAT (SET/KITS/TRAYS/PACK) IMPLANT
YANKAUER SUCT BULB TIP NO VENT (SUCTIONS) ×1 IMPLANT

## 2021-12-22 NOTE — Progress Notes (Signed)
Patient ID: Tracy Estrada, female   DOB: 1950/08/12, 71 y.o.   MRN: [id number]   Pre Procedure note for inpatients:   Tracy Estrada has been scheduled for Procedure(s): PARTIAL COLECTOMY AND COLOSTOMY (N/A) today. The various methods of treatment have been discussed with the patient. After consideration of the risks, benefits and treatment options the patient has consented to the planned procedure.   The patient has been seen and labs reviewed. There are no changes in the patient's condition to prevent proceeding with the planned procedure today.  Recent labs:  Lab Results  Component Value Date   WBC 5.0 12/15/2021   HGB 11.0 (L) 12/15/2021   HCT 33.3 (L) 12/15/2021   PLT 234 12/15/2021   GLUCOSE 149 (H) 12/21/2021   CHOL 172 03/24/2021   TRIG 103 12/21/2021   HDL 55.60 03/24/2021   LDLCALC 96 03/24/2021   ALT 16 12/21/2021   AST 17 12/21/2021   NA 138 12/21/2021   K 4.0 12/21/2021   CL 105 12/21/2021   CREATININE 0.50 12/21/2021   BUN 24 (H) 12/21/2021   CO2 28 12/21/2021   TSH 2.152 06/20/2021   INR 1.3 (H) 06/20/2021   HGBA1C 5.4 12/14/2021    Coralie Keens, MD 12/22/2021 6:58 AM

## 2021-12-22 NOTE — Transfer of Care (Signed)
Immediate Anesthesia Transfer of Care Note  Patient: Tracy Estrada  Procedure(s) Performed: PARTIAL COLECTOMY AND COLOSTOMY (Abdomen)  Patient Location: PACU  Anesthesia Type:General  Level of Consciousness: drowsy  Airway & Oxygen Therapy: Patient Spontanous Breathing  Post-op Assessment: Report given to RN and Post -op Vital signs reviewed and stable  Post vital signs: Reviewed and stable  Last Vitals:  Vitals Value Taken Time  BP 176/76 12/22/21 0940  Temp 36.6 C 12/22/21 0940  Pulse 99 12/22/21 0945  Resp 14 12/22/21 0945  SpO2 92 % 12/22/21 0945  Vitals shown include unvalidated device data.  Last Pain:  Vitals:   12/22/21 0940  TempSrc:   PainSc: 5       Patients Stated Pain Goal: 3 (65/79/03 8333)  Complications: No notable events documented.

## 2021-12-22 NOTE — Progress Notes (Signed)
Entered in Error

## 2021-12-22 NOTE — Progress Notes (Signed)
   12/22/21 1451  Assess: MEWS Score  Temp 98 F (36.7 C)  BP (!) 152/77  MAP (mmHg) 98  Pulse Rate (!) 101  Resp 16  SpO2 95 %  O2 Device Nasal Cannula  O2 Flow Rate (L/min) 2 L/min  Assess: MEWS Score  MEWS Temp 0  MEWS Systolic 0  MEWS Pulse 1  MEWS RR 0  MEWS LOC 1  MEWS Score 2  MEWS Score Color Yellow  Assess: if the MEWS score is Yellow or Red  Were vital signs taken at a resting state? Yes  Focused Assessment No change from prior assessment  Does the patient meet 2 or more of the SIRS criteria? Yes  Does the patient have a confirmed or suspected source of infection? No  MEWS guidelines implemented *See Row Information* Yes  Treat  MEWS Interventions Administered scheduled meds/treatments  Pain Scale 0-10  Pain Score Asleep  Pain Type Surgical pain  Escalate  MEWS: Escalate Yellow: discuss with charge nurse/RN and consider discussing with provider and RRT  Notify: Charge Nurse/RN  Name of Charge Nurse/RN Notified angeltio  Date Charge Nurse/RN Notified 12/22/21  Time Charge Nurse/RN Notified 1454  Assess: SIRS CRITERIA  SIRS Temperature  0  SIRS Pulse 1  SIRS Respirations  0  SIRS WBC 1  SIRS Score Sum  2

## 2021-12-22 NOTE — Anesthesia Preprocedure Evaluation (Signed)
Anesthesia Evaluation  Patient identified by MRN, date of birth, ID band Patient awake    Reviewed: Allergy & Precautions, NPO status , Patient's Chart, lab work & pertinent test results  Airway Mallampati: II  TM Distance: >3 FB Neck ROM: Full    Dental  (+) Dental Advisory Given   Pulmonary neg pulmonary ROS,    breath sounds clear to auscultation       Cardiovascular negative cardio ROS   Rhythm:Regular Rate:Normal     Neuro/Psych negative neurological ROS     GI/Hepatic Neg liver ROS, PUD, diverticulitis   Endo/Other    Renal/GU Renal disease     Musculoskeletal  (+) Arthritis , Fibromyalgia -  Abdominal   Peds  Hematology  (+) Blood dyscrasia, anemia ,   Anesthesia Other Findings   Reproductive/Obstetrics                             Lab Results  Component Value Date   WBC 5.0 12/15/2021   HGB 11.0 (L) 12/15/2021   HCT 33.3 (L) 12/15/2021   MCV 99.1 12/15/2021   PLT 234 12/15/2021   Lab Results  Component Value Date   CREATININE 0.50 12/21/2021   BUN 24 (H) 12/21/2021   NA 138 12/21/2021   K 4.0 12/21/2021   CL 105 12/21/2021   CO2 28 12/21/2021    Anesthesia Physical Anesthesia Plan  ASA: 3  Anesthesia Plan: General   Post-op Pain Management: Tylenol PO (pre-op)*, Toradol IV (intra-op)* and Ketamine IV*   Induction: Intravenous  PONV Risk Score and Plan: 4 or greater and Dexamethasone, Ondansetron, Treatment may vary due to age or medical condition and Propofol infusion  Airway Management Planned: Oral ETT  Additional Equipment: None  Intra-op Plan:   Post-operative Plan: Extubation in OR  Informed Consent: I have reviewed the patients History and Physical, chart, labs and discussed the procedure including the risks, benefits and alternatives for the proposed anesthesia with the patient or authorized representative who has indicated his/her understanding  and acceptance.     Dental advisory given  Plan Discussed with: CRNA  Anesthesia Plan Comments:         Anesthesia Quick Evaluation

## 2021-12-22 NOTE — Op Note (Signed)
PARTIAL COLECTOMY AND COLOSTOMY  Procedure Note  Tracy Estrada 12/11/2021 - 12/22/2021   Pre-op Diagnosis: SIGMOID DIVERTICULITIS WITH PERFORATION AND FISTULA     Post-op Diagnosis: SAMD  Procedure(s): SIGMOID COLECTOMY AND COLOSTOMY (HARTMAN'S PROCEDURE)  Surgeon(s): Coralie Keens, MD Lossie Faes, MD [facility] Resident  Anesthesia: General  Staff:  Circulator: Zannie Kehr, RN; Leia Alf, RN Relief Circulator: Rozell Searing, RN Relief Scrub: Rozell Searing, RN Scrub Person: Amedeo Plenty, Amy E  Estimated Blood Loss: Minimal               Specimens: sent to path  Indications: This is a 71 year old female presented earlier this year with perforated sigmoid diverticulitis with abscess requiring interventional radiology placed a drain.  She had been followed by her colorectal surgeons.  She has failed to improve and have drain upsized.  Because of malnutrition she then had to go on TPN.  She now has a drain with output consistent with fistula.  Given her failure to improve, the decision was made to proceed with a sigmoid colectomy and colostomy.  Findings: The patient was found to have an extensive phlegmon in the pelvis involving the sigmoid colon with loops of small bowel fixated over the top of the colon.  The small bowel was able to be freed off of the colon without the need for resection.  Procedure: The patient was brought to the operating room identifies a correct patient.  She was placed upon the operating table and general anesthesia was induced.  Her abdomen was prepped and draped in usual sterile fashion.  A midline incision was then created with a scalpel.  This was carried down through the subcutaneous tissue to the fascia which was then opened the entire length of the incision.  The peritoneum was then opened as well.  Upon entering the abdomen the heart phlegmon could be felt.  The left lower quadrant drain was going through the peritoneum into the side of the  colon.  Omentum was then freed up off of the top of the colon with the electrocautery.  The patient had 2 loops of small bowel fixated to the surface of the colon.  These were slowly removed with finger fracturing and Metzenbaum scissors.  We were then able to run the small bowel and saw no evidence of bowel injury.  We were then able to identify healthy colon just proximal to the phlegmon.  We transected this with a GIA 75 stapler and then took down the mesentery with the LigaSure.  We then slowly dissected the colon out of the retroperitoneum.  It was quite fixed to the retroperitoneum.  Just underneath the anterior peritoneal reflection there was a sterile fluid collection for previous abscesses had been located.  The patient had a previous hysterectomy.  There was no evidence of fistula to the bladder.  We were finally able to free up the colon from the retroperitoneum with both blunt dissection and cautery and then identified healthy rectum.  We transected the colon here with the contour stapler.  I then removed the specimen completely, the distal segment with a silk suture.  We then placed 2 separate 2-0 Prolene sutures at the end of the rectal stump for marking purposes.  We then copiously irrigated the abdomen normal saline.  Hemostasis appeared to be achieved.  The ureters appear to be intact bilaterally.  At this point a separate skin incision and placed a 19 [place] Blake drain into the pelvis.  We then mobilized the  proximal colon along the white line of Toldt.  The patient been previously marked by the ostomy nurses.  We made elliptical incision at the marked spot in the patient's left abdomen.  We took this down to the fascia which was then opened in a cruciate fashion.  The underlying muscle was then bluntly dissected the peritoneum was opened.  We then pulled the descending colon out of this opening as the end colostomy.  We again irrigated the abdomen with several liters normal saline.  Hemostasis  appeared to be achieved.  The patient's midline fascia was then closed with running #1 looped PDS suture.  We then open the staple line at the ostomy site with cautery and matured the ostomy circumferentially with 3-0 Vicryl sutures.  The midline wound was then packed with wet-to-dry saline soaked gauze.  An ostomy appliance was applied.  The ostomy appeared pink and well perfused.  The drain was then placed to bulb suction after being sewn in place with nylon suture.  The patient tolerated procedure well.  All counts were correct at the end of the procedure.  The patient was then extubated in the operating room and taken in stable condition to the recovery.          Coralie Keens   Date: 12/22/2021  Time: 9:25 AM

## 2021-12-22 NOTE — Progress Notes (Signed)
PROGRESS NOTE    Tracy Estrada  WUJ:[id number] DOB: 1950-03-11 DOA: 12/11/2021 PCP: [name], NP   Brief Narrative:  Patient is a 71 year old female who presented to the ED with complaint of 2-week history of fatigue, chills, decreased appetite, vomiting.  She was admitted in April and was managed for perforated diverticulitis and was managed nonoperatively and eventually had IR drain placed, and was discharged.  Admitted for further management.  General surgery following.  Found to have acute on chronic sigmoid diverticulitis with perforation, persistent sigmoid colonic fistula.  Started on TPN, full liquid diet.  General surgery performed Hartman's procedure on 12/22/2021.  Assessment & Plan:   Principal Problem:   Sigmoid diverticulitis Active Problems:   Urinary tract infection   Bilateral hydronephrosis   Anxiety and depression   Hyperlipidemia  Acute on chronic sigmoid diverticulitis with perforation/colonic fistula: Status post drain placement by IR.  Was managed conservatively with antibiotics.  Presented with fever, chills, fatigue.  Currently afebrile, no leukocytosis, hemodynamically stable.  CT imaging showed slight progression of inflammatory changes around the sigmoid colon compared to last one.  General surgery following.  Found to have acute on chronic sigmoid diverticulitis with perforation, persistent sigmoid colonic fistula.  Started on TPN, full liquid diet.  Underwent drain exchange by radiology on 12/15/2021.  Now s/p Hartman's procedure on 12/22/2021.  Continue Zosyn.  Will defer to general surgery for the duration of the antibiotic.   Urinary tract infection/bilateral hydronephrosis: UA was positive for leukocytes, nitrates, bacteria.  Patient was also having increased frequency.  CT showed mild bilateral hydronephrosis without signs of stricture or fistula.   urine culture showed Klebsiella which is pansensitive.   Already on Zosyn   Anxiety/ depression: On  Wellbutrin   Hyperlipidemia: On pravastatin   Nutrition Problem: Moderate Malnutrition Etiology: acute illness.  She is on TPN and p.o. boost.  DVT prophylaxis: enoxaparin (LOVENOX) injection 40 mg Start: 12/23/21 0800   Code Status: Full Code  Family Communication:  None present at bedside.  Plan of care discussed with patient in length and he/she verbalized understanding and agreed with it.  Status is: Inpatient Remains inpatient appropriate because: She had surgery today.  Estimated body mass index is 21.48 kg/m as calculated from the following:   Height as of this encounter: 5' (1.524 m).   Weight as of this encounter: 49.9 kg.    Nutritional Assessment: Body mass index is 21.48 kg/m.Marland Kitchen Seen by dietician.  I agree with the assessment and plan as outlined below: Nutrition Status: Nutrition Problem: Moderate Malnutrition Etiology: acute illness Signs/Symptoms: energy intake < 75% for > 7 days, percent weight loss, mild fat depletion, mild muscle depletion (5% wt loss in 2 weeks) Percent weight loss: 5 % Interventions: Boost Breeze, Magic cup  . Skin Assessment: I have examined the patient's skin and I agree with the wound assessment as performed by the wound care RN as outlined below:    Consultants:  IR General surgery  Procedures:  As above  Antimicrobials:  Anti-infectives (From admission, onward)    Start     Dose/Rate Route Frequency Ordered Stop   12/17/21 1400  piperacillin-tazobactam (ZOSYN) IVPB 3.375 g        3.375 g 12.5 mL/hr over 240 Minutes Intravenous Every 8 hours 12/17/21 1052     12/12/21 1400  piperacillin-tazobactam (ZOSYN) IVPB 3.375 g        3.375 g 12.5 mL/hr over 240 Minutes Intravenous Every 8 hours 12/12/21 0738 12/17/21 2011  12/12/21 0745  piperacillin-tazobactam (ZOSYN) IVPB 3.375 g        3.375 g 100 mL/hr over 30 Minutes Intravenous  Once 12/12/21 0740 12/12/21 0922   12/12/21 0300  cefTRIAXone (ROCEPHIN) 1 g in sodium chloride  0.9 % 100 mL IVPB        1 g 200 mL/hr over 30 Minutes Intravenous  Once 12/12/21 0252 12/12/21 0527         Subjective:  Patient seen and examined before surgery today.  No complaints.  Objective: Vitals:   12/22/21 0940 12/22/21 0955 12/22/21 1010 12/22/21 1140  BP: (!) 176/76 (!) 157/60 (!) 143/60   Pulse: 99 93 91   Resp: '18 14 11 18  '$ Temp: 97.9 F (36.6 C)  98 F (36.7 C)   TempSrc:      SpO2: 92% 97% 98% 94%  Weight:      Height:        Intake/Output Summary (Last 24 hours) at 12/22/2021 1310 Last data filed at 12/22/2021 1010 Gross per 24 hour  Intake 868 ml  Output 380 ml  Net 488 ml    Filed Weights   12/11/21 1614 12/17/21 0900  Weight: 49.9 kg 49.9 kg    Examination:  General exam: Appears calm and comfortable  Respiratory system: Clear to auscultation. Respiratory effort normal. Cardiovascular system: S1 & S2 heard, RRR. No JVD, murmurs, rubs, gallops or clicks. No pedal edema. Gastrointestinal system: Abdomen is nondistended, soft and nontender. No organomegaly or masses felt. Normal bowel sounds heard. Central nervous system: Alert and oriented. No focal neurological deficits. Extremities: Symmetric 5 x 5 power. Skin: No rashes, lesions or ulcers.  Psychiatry: Judgement and insight appear normal. Mood & affect appropriate.    Data Reviewed: I have personally reviewed following labs and imaging studies  CBC: No results for input(s): "WBC", "NEUTROABS", "HGB", "HCT", "MCV", "PLT" in the last 168 hours.  Basic Metabolic Panel: Recent Labs  Lab 12/16/21 0413 12/17/21 0402 12/18/21 0214 12/19/21 0310 12/21/21 0424  NA 137 139 141 138 138  K 4.3 4.1 4.0 3.7 4.0  CL 101 99 104 102 105  CO2 '29 28 28 28 28  '$ GLUCOSE 135* 130* 131* 141* 149*  BUN '14 17 18 19 '$ 24*  CREATININE 0.63 0.58 0.59 0.58 0.50  CALCIUM 9.4 10.1 9.9 9.7 9.5  MG 2.3 2.2 2.3 2.0 2.1  PHOS 3.6 3.8 4.1 3.6 3.0    GFR: Estimated Creatinine Clearance: 45 mL/min (by C-G  formula based on SCr of 0.5 mg/dL). Liver Function Tests: Recent Labs  Lab 12/17/21 0402 12/21/21 0424  AST 16 17  ALT 16 16  ALKPHOS 41 35*  BILITOT 0.4 0.3  PROT 6.2* 6.0*  ALBUMIN 2.9* 2.9*    No results for input(s): "LIPASE", "AMYLASE" in the last 168 hours.  No results for input(s): "AMMONIA" in the last 168 hours. Coagulation Profile: No results for input(s): "INR", "PROTIME" in the last 168 hours. Cardiac Enzymes: No results for input(s): "CKTOTAL", "CKMB", "CKMBINDEX", "TROPONINI" in the last 168 hours. BNP (last 3 results) No results for input(s): "PROBNP" in the last 8760 hours. HbA1C: No results for input(s): "HGBA1C" in the last 72 hours.  CBG: Recent Labs  Lab 12/20/21 1216 12/20/21 2040 12/21/21 0008 12/21/21 2133 12/22/21 1249  GLUCAP 129* 139* 154* 154* 223*    Lipid Profile: Recent Labs    12/21/21 0424  TRIG 103    Thyroid Function Tests: No results for input(s): "TSH", "T4TOTAL", "FREET4", "T3FREE", "THYROIDAB" in  the last 72 hours. Anemia Panel: No results for input(s): "VITAMINB12", "FOLATE", "FERRITIN", "TIBC", "IRON", "RETICCTPCT" in the last 72 hours. Sepsis Labs: No results for input(s): "PROCALCITON", "LATICACIDVEN" in the last 168 hours.  Recent Results (from the past 240 hour(s))  Culture, blood (Routine X 2) w Reflex to ID Panel     Status: None   Collection Time: 12/13/21  8:33 AM   Specimen: BLOOD  Result Value Ref Range Status   Specimen Description BLOOD BLOOD RIGHT HAND  Final   Special Requests   Final    BOTTLES DRAWN AEROBIC AND ANAEROBIC Blood Culture results may not be optimal due to an inadequate volume of blood received in culture bottles   Culture   Final    NO GROWTH 5 DAYS Performed at [facility], [place] [address], [place], [place] [address]    Report Status 12/18/2021 FINAL  Final  Culture, blood (Routine X 2) w Reflex to ID Panel     Status: None   Collection Time: 12/13/21  8:33 AM   Specimen:  BLOOD  Result Value Ref Range Status   Specimen Description BLOOD BLOOD RIGHT HAND  Final   Special Requests   Final    BOTTLES DRAWN AEROBIC AND ANAEROBIC Blood Culture results may not be optimal due to an inadequate volume of blood received in culture bottles   Culture   Final    NO GROWTH 5 DAYS Performed at [facility], [place] [address], [place], [place] [address]    Report Status 12/18/2021 FINAL  Final  Surgical pcr screen     Status: None   Collection Time: 12/22/21  5:54 AM   Specimen: Nasal Mucosa; Nasal Swab  Result Value Ref Range Status   MRSA, PCR NEGATIVE NEGATIVE Final   Staphylococcus aureus NEGATIVE NEGATIVE Final    Comment: (NOTE) The Xpert SA Assay (FDA approved for NASAL specimens in patients 50 years of age and older), is one component of a comprehensive surveillance program. It is not intended to diagnose infection nor to guide or monitor treatment. Performed at [facility], [place] [address], [place], [place] [address]      Radiology Studies: No results found.  Scheduled Meds:  [START ON 12/23/2021] alvimopan  12 mg Oral BID   buPROPion  300 mg Oral Daily   busPIRone  5 mg Oral BID   Chlorhexidine Gluconate Cloth  6 each Topical Daily   [START ON 12/23/2021] enoxaparin (LOVENOX) injection  40 mg Subcutaneous Q24H   feeding supplement  1 Container Oral TID BM   fentaNYL       insulin aspart  0-6 Units Subcutaneous 4 times per day   lip balm   Topical BID   morphine   Intravenous Q4H   pantoprazole (PROTONIX) IV  40 mg Intravenous QHS   sodium chloride flush  10-40 mL Intracatheter Q12H   sodium chloride flush  3 mL Intravenous Q12H   sodium chloride flush  5 mL Intracatheter Q8H   Continuous Infusions:  ondansetron (ZOFRAN) IV     piperacillin-tazobactam (ZOSYN)  IV 3.375 g (93/81/01 7510)   TPN CYCLIC-ADULT (ION)       LOS: 10 days   Darliss Cheney, MD [facility]  12/22/2021, 1:10 PM   *Please note that this is a verbal  dictation therefore any spelling or grammatical errors are due to the "Joice One" system interpretation.  Please page via [place] and do not message via secure chat for  urgent patient care matters. Secure chat can be used for non urgent patient care matters.  How to contact the [facility] Attending or Consulting provider [place] or covering provider during after hours [facility], for this patient?  Check the care team in [facility] and look for a) attending/consulting [facility] provider listed and b) the [facility] team listed. Page or secure chat [phone]. Log into [email or web address] and use [place]'s universal password to access. If you do not have the password, please contact the hospital operator. Locate the [facility] provider you are looking for under [facility] and page to a number that you can be directly reached. If you still have difficulty reaching the provider, please page the [facility] (Director on Call) for the Hospitalists listed on amion for assistance.

## 2021-12-22 NOTE — Anesthesia Procedure Notes (Signed)
Procedure Name: Intubation Date/Time: 12/22/2021 7:41 AM  Performed by: Dorann Lodge, CRNAPre-anesthesia Checklist: Patient identified, Emergency Drugs available, Suction available and Patient being monitored Patient Re-evaluated:Patient Re-evaluated prior to induction Oxygen Delivery Method: Circle System Utilized Preoxygenation: Pre-oxygenation with 100% oxygen Induction Type: IV induction and Rapid sequence Laryngoscope Size: Mac and 3 Grade View: Grade I Tube type: Oral Tube size: 7.0 mm Number of attempts: 1 Airway Equipment and Method: Stylet Placement Confirmation: ETT inserted through vocal cords under direct vision, positive ETCO2 and breath sounds checked- equal and bilateral Secured at: 21 cm Tube secured with: Tape Dental Injury: Teeth and Oropharynx as per pre-operative assessment

## 2021-12-22 NOTE — Anesthesia Postprocedure Evaluation (Signed)
Anesthesia Post Note  Patient: [name]  Procedure(s) Performed: SIGMOID COLECTOMY AND COLOSTOMY (HARTMAN'S PROCEDURE) (Abdomen)     Patient location during evaluation: PACU Anesthesia Type: General Level of consciousness: awake and alert Pain management: pain level controlled Vital Signs Assessment: post-procedure vital signs reviewed and stable Respiratory status: spontaneous breathing, nonlabored ventilation, respiratory function stable and patient connected to nasal cannula oxygen Cardiovascular status: blood pressure returned to baseline and stable Postop Assessment: no apparent nausea or vomiting Anesthetic complications: no   No notable events documented.  Last Vitals:  Vitals:   12/22/21 1140 12/22/21 1330  BP:  (!) 155/76  Pulse:  (!) 101  Resp: 18 15  Temp:  36.5 C  SpO2: 94% 94%    Last Pain:  Vitals:   12/22/21 1330  TempSrc: Oral  PainSc:                  Tiajuana Amass

## 2021-12-22 NOTE — Progress Notes (Signed)
PHARMACY - TOTAL PARENTERAL NUTRITION CONSULT NOTE   Indication: complicated diverticulitis  Patient Measurements: Height: 5' (152.4 cm) Weight: 49.9 kg (110 lb 0.2 oz) IBW/kg (Calculated) : 45.5 TPN AdjBW (KG): 49.9 Body mass index is 21.48 kg/m. Usual Weight: 45 kg  Assessment:  71 yo F admitted on 10/7 who presented with weakness and 8lb weight loss over 2 weeks. Patient been eating "poorly" ~ 1 month at home with limited toleration d/t diverticulitis. (Has cutaneous drain in place since April 2023 for diverticulitis w/ abscess). Pharmacy has been consulted for TPN management to improve nutritional status in order to help stimulate positive post-op recovery.   Patient switched Boost supplements to Premier Protein supplements and notes nausea/vomiting improved. She has also been able to tolerate sherbet ice cream.   Patient scheduled to undergo partial colectomy and colostomy today. Will continue TPN at current cycle and re-evaluate post-op.   Glucose / Insulin: A1c 5.4%; CBG <160, CBG checks 4 times daily, vsSSI resumed 0 units/24hr Electrolytes: Lytes WNL and stable Renal: Scr <1 stable, BUN slightly up (24) Hepatic: Alb 2.9, prealbumin up from 7 > 15, LFTs WNL, TG 103 Intake / Output; MIVF: UOP: not quantified but x 5 occurrences; LBM 10/15, 110 mL drain output - still reporting nausea/vomiting  GI Imaging: 10/07 CTAP colon diverticulosis; no recurrent abscess; complicated acute on chronic sigmoid diverticulitis with progression since June GI Surgeries / Procedures:  10/10 LLQ abscess drain catheter exchange in IR Planning for GI operation- Hartman's procedure this week, pending repeat pre-albumin level (now up to 15)  Central access: PICC (double-lumen) placed 10/08 TPN start date: 10/08  Nutritional Goals: Goal TPN rate is 70 mL/hr (provides 94 g of protein and 1792 kcals per day)  RD Assessment: Estimated Needs Total Energy Estimated Needs: 1600-1800 Total Protein  Estimated Needs: 80-95 grams Total Fluid Estimated Needs: >/= 1.6 L  Current Nutrition:  TPN + FLD Premier protein shakes  Plan:  Continue to cycle TPN over 18 hours (GIR 2.37-4.27; providing 94g protein, meeting 100% of patients needs) - At 1800, begin TPN at 49 mL/hr for 1 hour, then increase to 99 mL/hr x 16 hours, then at 1100 on 10/17 reduce TPN to 49 mL/hr x 1 hour and stop Electrolytes in TPN: Na 73mq/L, K 452m/L, Ca 92m67mL, Mg 3mE61m, and Phos 18mm63m. Max Cl Add standard MVI and trace elements to TPN Resume vsSSI and CBG checks QID while on cycle TPN (0800, 1300, 1700, 2000) Monitor TPN labs on Mon/Thurs, daily TPN labs until stable   Thank you for allowing pharmacy to be a part of this patient's care.  AdrieArdyth HarpsrmD Clinical Pharmacist

## 2021-12-22 NOTE — Plan of Care (Signed)
  Problem: Activity: Goal: Risk for activity intolerance will decrease Outcome: Progressing   Problem: Nutrition: Goal: Adequate nutrition will be maintained Outcome: Progressing   Problem: Coping: Goal: Level of anxiety will decrease Outcome: Progressing   Problem: Elimination: Goal: Will not experience complications related to bowel motility Outcome: Progressing   Problem: Pain Managment: Goal: General experience of comfort will improve Outcome: Progressing   Problem: Skin Integrity: Goal: Risk for impaired skin integrity will decrease Outcome: Progressing

## 2021-12-23 ENCOUNTER — Encounter (HOSPITAL_COMMUNITY): Payer: Self-pay | Admitting: Surgery

## 2021-12-23 DIAGNOSIS — K5732 Diverticulitis of large intestine without perforation or abscess without bleeding: Secondary | ICD-10-CM | POA: Diagnosis not present

## 2021-12-23 LAB — CBC
HCT: 28.5 % — ABNORMAL LOW (ref 36.0–46.0)
Hemoglobin: 9.7 g/dL — ABNORMAL LOW (ref 12.0–15.0)
MCH: 34 pg (ref 26.0–34.0)
MCHC: 34 g/dL (ref 30.0–36.0)
MCV: 100 fL (ref 80.0–100.0)
Platelets: 219 10*3/uL (ref 150–400)
RBC: 2.85 MIL/uL — ABNORMAL LOW (ref 3.87–5.11)
RDW: 12.4 % (ref 11.5–15.5)
WBC: 10.5 10*3/uL (ref 4.0–10.5)
nRBC: 0 % (ref 0.0–0.2)

## 2021-12-23 LAB — BASIC METABOLIC PANEL
Anion gap: 6 (ref 5–15)
BUN: 21 mg/dL (ref 8–23)
CO2: 27 mmol/L (ref 22–32)
Calcium: 9.2 mg/dL (ref 8.9–10.3)
Chloride: 103 mmol/L (ref 98–111)
Creatinine, Ser: 0.51 mg/dL (ref 0.44–1.00)
GFR, Estimated: >60 mL/min (ref >60)
Glucose, Bld: 240 mg/dL — ABNORMAL HIGH (ref 70–99)
Potassium: 4 mmol/L (ref 3.5–5.1)
Sodium: 136 mmol/L (ref 135–145)

## 2021-12-23 LAB — SURGICAL PATHOLOGY

## 2021-12-23 LAB — GLUCOSE, CAPILLARY
Glucose-Capillary: 251 mg/dL — ABNORMAL HIGH (ref 70–99)
Glucose-Capillary: 194 mg/dL — ABNORMAL HIGH (ref 70–99)
Glucose-Capillary: 104 mg/dL — ABNORMAL HIGH (ref 70–99)
Glucose-Capillary: 159 mg/dL — ABNORMAL HIGH (ref 70–99)

## 2021-12-23 MED ORDER — TRACE MINERALS CU-MN-SE-ZN 300-55-60-3000 MCG/ML IV SOLN
INTRAVENOUS | Status: AC
  Filled 2021-12-23: qty 627.2

## 2021-12-23 MED ORDER — METHOCARBAMOL 500 MG PO TABS
500.0000 mg | ORAL_TABLET | Freq: Four times a day (QID) | ORAL | Status: DC
  Administered 2021-12-23 (×3): 500 mg via ORAL
  Filled 2021-12-23 (×3): qty 1

## 2021-12-23 MED ORDER — INSULIN GLARGINE-YFGN 100 UNIT/ML ~~LOC~~ SOLN
5.0000 [IU] | Freq: Every day | SUBCUTANEOUS | Status: DC
  Administered 2021-12-23: 5 [IU] via SUBCUTANEOUS
  Filled 2021-12-23 (×2): qty 0.05

## 2021-12-23 MED ORDER — ACETAMINOPHEN 500 MG PO TABS
1000.0000 mg | ORAL_TABLET | Freq: Four times a day (QID) | ORAL | Status: DC
  Administered 2021-12-23 – 2021-12-29 (×15): 1000 mg via ORAL
  Filled 2021-12-23 (×19): qty 2

## 2021-12-23 MED ORDER — LIDOCAINE 5 % EX PTCH
1.0000 | MEDICATED_PATCH | CUTANEOUS | Status: DC
  Administered 2021-12-23 – 2021-12-29 (×6): 1 via TRANSDERMAL
  Filled 2021-12-23 (×7): qty 1

## 2021-12-23 MED ORDER — ALTEPLASE 2 MG IJ SOLR
2.0000 mg | Freq: Once | INTRAMUSCULAR | Status: AC
  Administered 2021-12-23: 2 mg
  Filled 2021-12-23: qty 2

## 2021-12-23 NOTE — Progress Notes (Signed)
[facility] Progress Note  1 Day Post-Op  Subjective: CC-  Cc abdominal soreness. Denies belching, nausea, or vomiting. Has not been OOB since surgery. Her husband is at bedside.   Objective: Vital signs in last 24 hours: Temp:  [97.7 F (36.5 C)-99.8 F (37.7 C)] 99.8 F (37.7 C) (10/18 0743) Pulse Rate:  [91-110] 110 (10/18 0743) Resp:  [10-19] 17 (10/18 0905) BP: (125-165)/(58-77) 125/61 (10/18 0743) SpO2:  [92 %-98 %] 93 % (10/18 0905) FiO2 (%):  [0 %] 0 % (10/18 0116) Last BM Date : 12/21/21  Intake/Output from previous day: 10/17 0701 - 10/18 0700 In: 2111.3 [I.V.:1560.5; IV Piggyback:550.8] Out: 955 [Urine:780; Drains:125; Blood:50] Intake/Output this shift: No intake/output data recorded.  PE: Gen:  Alert, NAD, pleasant Card:  RRR Pulm:  CTAB, no W/R/R, rate and effort normal Abd: Soft, appropriately tender, patient with voluntary guarding. RLQ drain SS, midline wound saturated with SS drainage -- patient refused dressing change due to pain. LLQ ostomy pink and viable, no gas or stool in pouch.  Lab Results:  No results for input(s): "WBC", "HGB", "HCT", "PLT" in the last 72 hours.  BMET Recent Labs    12/21/21 0424 12/23/21 0446  NA 138 136  K 4.0 4.0  CL 105 103  CO2 28 27  GLUCOSE 149* 240*  BUN 24* 21  CREATININE 0.50 0.51  CALCIUM 9.5 9.2   PT/INR No results for input(s): "LABPROT", "INR" in the last 72 hours. CMP     Component Value Date/Time   NA 136 12/23/2021 0446   NA 141 04/07/2016 1348   K 4.0 12/23/2021 0446   CL 103 12/23/2021 0446   CO2 27 12/23/2021 0446   GLUCOSE 240 (H) 12/23/2021 0446   BUN 21 12/23/2021 0446   BUN 16 04/07/2016 1348   CREATININE 0.51 12/23/2021 0446   CREATININE 0.62 09/16/2015 1245   CALCIUM 9.2 12/23/2021 0446   PROT 6.0 (L) 12/21/2021 0424   PROT 6.7 04/07/2016 1348   ALBUMIN 2.9 (L) 12/21/2021 0424   ALBUMIN 4.5 04/07/2016 1348   AST 17 12/21/2021 0424   ALT 16 12/21/2021 0424    ALKPHOS 35 (L) 12/21/2021 0424   BILITOT 0.3 12/21/2021 0424   BILITOT 0.4 04/07/2016 1348   GFRNONAA >60 12/23/2021 0446   GFRAA >60 06/03/2016 1101   Lipase     Component Value Date/Time   LIPASE 23 12/11/2021 1644       Studies/Results: No results found.  Anti-infectives: Anti-infectives (From admission, onward)    Start     Dose/Rate Route Frequency Ordered Stop   12/17/21 1400  piperacillin-tazobactam (ZOSYN) IVPB 3.375 g        3.375 g 12.5 mL/hr over 240 Minutes Intravenous Every 8 hours 12/17/21 1052     12/12/21 1400  piperacillin-tazobactam (ZOSYN) IVPB 3.375 g        3.375 g 12.5 mL/hr over 240 Minutes Intravenous Every 8 hours 12/12/21 0738 12/17/21 2011   12/12/21 0745  piperacillin-tazobactam (ZOSYN) IVPB 3.375 g        3.375 g 100 mL/hr over 30 Minutes Intravenous  Once 12/12/21 0740 12/12/21 0922   12/12/21 0300  cefTRIAXone (ROCEPHIN) 1 g in sodium chloride 0.9 % 100 mL IVPB        1 g 200 mL/hr over 30 Minutes Intravenous  Once 12/12/21 0252 12/12/21 0527        Assessment/Plan Acute on chronic sigmoid diverticulitis with perforation, colonic fistula with IR drain - Afebrile, HR 110  bpm (normotensive, likely pain mediated) - CBC pending, BMP looks ok - BUN/Cr normal, hyperglycemic - adjust pain meds as below - continue foley for today, UOP marginal (780 mL), plan to D/C foley tomorrow - continue blake drain - start BID moist-to-dry dressing changes - WOC RN for new ostomy education - mobilize and IS  PainL scheduled tylenol, scheduled robaxin 500 mg QID, lidoderm patch, continue PCA for now.  FEN: FLD, protein shakes ID: Zosyn >> plan to continue for 5 days post-op. Foley: placed 10/17, plan to remove tomorrow 10/19 Dispo: Continue IV abx, FLD, TPN.    I reviewed last 24 h vitals and pain scores, last 48 h intake and output, last 24 h labs and trends, and last 24 h imaging results.    LOS: 11 days    [facility] 12/23/2021, 9:56 AM Please see Amion for pager number during day hours 7:00am-4:30pm

## 2021-12-23 NOTE — Progress Notes (Signed)
PROGRESS NOTE    Tracy Estrada  ZWC:[id number] DOB: 1950-11-23 DOA: 12/11/2021 PCP: [name], NP   Brief Narrative:  Patient is a 71 year old female who presented to the ED with complaint of 2-week history of fatigue, chills, decreased appetite, vomiting.  She was admitted in April and was managed for perforated diverticulitis and was managed nonoperatively and eventually had IR drain placed, and was discharged.  Admitted for further management.  General surgery following.  Found to have acute on chronic sigmoid diverticulitis with perforation, persistent sigmoid colonic fistula.  Started on TPN, full liquid diet.  General surgery performed Hartman's procedure on 12/22/2021.  Assessment & Plan:   Principal Problem:   Sigmoid diverticulitis Active Problems:   Urinary tract infection   Bilateral hydronephrosis   Anxiety and depression   Hyperlipidemia  Acute on chronic sigmoid diverticulitis with perforation/colonic fistula: Status post drain placement by IR.  Was managed conservatively with antibiotics.  Presented with fever, chills, fatigue.  Currently afebrile, no leukocytosis, hemodynamically stable.  CT imaging showed slight progression of inflammatory changes around the sigmoid colon compared to last one.  General surgery following.  Found to have acute on chronic sigmoid diverticulitis with perforation, persistent sigmoid colonic fistula.  Started on TPN, full liquid diet.  Underwent drain exchange by radiology on 12/15/2021.  Now s/p Hartman's procedure on 12/22/2021.  She complains of pain, she is on PCA morphine.  She is not allowing me to examine her belly due to severe pain.  Continue Zosyn.  Will defer to general surgery for the duration of the antibiotic.  She is on full liquid diet.   Urinary tract infection/bilateral hydronephrosis: UA was positive for leukocytes, nitrates, bacteria.  Patient was also having increased frequency.  CT showed mild bilateral hydronephrosis  without signs of stricture or fistula.   urine culture showed Klebsiella which is pansensitive.   Already on Zosyn   Anxiety/ depression: On Wellbutrin   Hyperlipidemia: On pravastatin  Hyperglycemia: Does not have history of diabetes.  Hemoglobin A1c 5.4 however currently hyperglycemic, could very well be due to TPN.  Will start on Lantus 10 units.   Nutrition Problem: Moderate Malnutrition Etiology: acute illness.  She is on TPN and p.o. boost.  DVT prophylaxis: enoxaparin (LOVENOX) injection 40 mg Start: 12/23/21 0800   Code Status: Full Code  Family Communication:  None present at bedside.  Plan of care discussed with patient in length and he/she verbalized understanding and agreed with it.  Status is: Inpatient Remains inpatient appropriate because: She had surgery today.  Estimated body mass index is 21.48 kg/m as calculated from the following:   Height as of this encounter: 5' (1.524 m).   Weight as of this encounter: 49.9 kg.    Nutritional Assessment: Body mass index is 21.48 kg/m.Marland Kitchen Seen by dietician.  I agree with the assessment and plan as outlined below: Nutrition Status: Nutrition Problem: Moderate Malnutrition Etiology: acute illness Signs/Symptoms: energy intake < 75% for > 7 days, percent weight loss, mild fat depletion, mild muscle depletion (5% wt loss in 2 weeks) Percent weight loss: 5 % Interventions: Boost Breeze, Magic cup  . Skin Assessment: I have examined the patient's skin and I agree with the wound assessment as performed by the wound care RN as outlined below:    Consultants:  IR General surgery  Procedures:  As above  Antimicrobials:  Anti-infectives (From admission, onward)    Start     Dose/Rate Route Frequency Ordered Stop   12/17/21 1400  piperacillin-tazobactam (ZOSYN) IVPB 3.375 g        3.375 g 12.5 mL/hr over 240 Minutes Intravenous Every 8 hours 12/17/21 1052     12/12/21 1400  piperacillin-tazobactam (ZOSYN) IVPB 3.375 g         3.375 g 12.5 mL/hr over 240 Minutes Intravenous Every 8 hours 12/12/21 0738 12/17/21 2011   12/12/21 0745  piperacillin-tazobactam (ZOSYN) IVPB 3.375 g        3.375 g 100 mL/hr over 30 Minutes Intravenous  Once 12/12/21 0740 12/12/21 0922   12/12/21 0300  cefTRIAXone (ROCEPHIN) 1 g in sodium chloride 0.9 % 100 mL IVPB        1 g 200 mL/hr over 30 Minutes Intravenous  Once 12/12/21 0252 12/12/21 0527         Subjective:  Patient seen and examined.  Family members at the bedside.  Patient complains of severe pain in the belly.  Objective: Vitals:   12/23/21 0400 12/23/21 0511 12/23/21 0743 12/23/21 0905  BP:   125/61   Pulse:   (!) 110   Resp: '16 14 11 17  '$ Temp:   99.8 F (37.7 C)   TempSrc:   Oral   SpO2: 93% 93% 92% 93%  Weight:      Height:        Intake/Output Summary (Last 24 hours) at 12/23/2021 1016 Last data filed at 12/23/2021 0634 Gross per 24 hour  Intake 1261.29 ml  Output 625 ml  Net 636.29 ml    Filed Weights   12/11/21 1614 12/17/21 0900  Weight: 49.9 kg 49.9 kg    Examination:  General exam: Appears calm and comfortable  Respiratory system: Clear to auscultation. Respiratory effort normal. Cardiovascular system: S1 & S2 heard, RRR. No JVD, murmurs, rubs, gallops or clicks. No pedal edema. Gastrointestinal system: Patient does not allow me to examine her abdomen. Central nervous system: Alert and oriented. No focal neurological deficits. Extremities: Symmetric 5 x 5 power. Skin: No rashes, lesions or ulcers.  Psychiatry: Judgement and insight appear normal. Mood & affect appropriate.    Data Reviewed: I have personally reviewed following labs and imaging studies  CBC: No results for input(s): "WBC", "NEUTROABS", "HGB", "HCT", "MCV", "PLT" in the last 168 hours.  Basic Metabolic Panel: Recent Labs  Lab 12/17/21 0402 12/18/21 0214 12/19/21 0310 12/21/21 0424 12/23/21 0446  NA 139 141 138 138 136  K 4.1 4.0 3.7 4.0 4.0  CL 99 104  102 105 103  CO2 '28 28 28 28 27  '$ GLUCOSE 130* 131* 141* 149* 240*  BUN '17 18 19 '$ 24* 21  CREATININE 0.58 0.59 0.58 0.50 0.51  CALCIUM 10.1 9.9 9.7 9.5 9.2  MG 2.2 2.3 2.0 2.1  --   PHOS 3.8 4.1 3.6 3.0  --     GFR: Estimated Creatinine Clearance: 45 mL/min (by C-G formula based on SCr of 0.51 mg/dL). Liver Function Tests: Recent Labs  Lab 12/17/21 0402 12/21/21 0424  AST 16 17  ALT 16 16  ALKPHOS 41 35*  BILITOT 0.4 0.3  PROT 6.2* 6.0*  ALBUMIN 2.9* 2.9*    No results for input(s): "LIPASE", "AMYLASE" in the last 168 hours.  No results for input(s): "AMMONIA" in the last 168 hours. Coagulation Profile: No results for input(s): "INR", "PROTIME" in the last 168 hours. Cardiac Enzymes: No results for input(s): "CKTOTAL", "CKMB", "CKMBINDEX", "TROPONINI" in the last 168 hours. BNP (last 3 results) No results for input(s): "PROBNP" in the last 8760 hours. HbA1C: No  results for input(s): "HGBA1C" in the last 72 hours.  CBG: Recent Labs  Lab 12/22/21 1249 12/22/21 1458 12/22/21 1553 12/22/21 2040 12/23/21 0805  GLUCAP 223* 153* 142* 269* 251*    Lipid Profile: Recent Labs    12/21/21 0424  TRIG 103    Thyroid Function Tests: No results for input(s): "TSH", "T4TOTAL", "FREET4", "T3FREE", "THYROIDAB" in the last 72 hours. Anemia Panel: No results for input(s): "VITAMINB12", "FOLATE", "FERRITIN", "TIBC", "IRON", "RETICCTPCT" in the last 72 hours. Sepsis Labs: No results for input(s): "PROCALCITON", "LATICACIDVEN" in the last 168 hours.  Recent Results (from the past 240 hour(s))  Surgical pcr screen     Status: None   Collection Time: 12/22/21  5:54 AM   Specimen: Nasal Mucosa; Nasal Swab  Result Value Ref Range Status   MRSA, PCR NEGATIVE NEGATIVE Final   Staphylococcus aureus NEGATIVE NEGATIVE Final    Comment: (NOTE) The Xpert SA Assay (FDA approved for NASAL specimens in patients 38 years of age and older), is one component of a  comprehensive surveillance program. It is not intended to diagnose infection nor to guide or monitor treatment. Performed at [facility], [place] [address] [place], [place] [address]      Radiology Studies: No results found.  Scheduled Meds:  acetaminophen  1,000 mg Oral Q6H   alvimopan  12 mg Oral BID   buPROPion  300 mg Oral Daily   busPIRone  5 mg Oral BID   Chlorhexidine Gluconate Cloth  6 each Topical Daily   enoxaparin (LOVENOX) injection  40 mg Subcutaneous Q24H   feeding supplement  1 Container Oral TID BM   insulin aspart  0-6 Units Subcutaneous 4 times per day   lidocaine  1 patch Transdermal Q24H   lip balm   Topical BID   methocarbamol  500 mg Oral QID   morphine   Intravenous Q4H   pantoprazole (PROTONIX) IV  40 mg Intravenous QHS   sodium chloride flush  10-40 mL Intracatheter Q12H   sodium chloride flush  3 mL Intravenous Q12H   sodium chloride flush  5 mL Intracatheter Q8H   Continuous Infusions:  ondansetron (ZOFRAN) IV     piperacillin-tazobactam (ZOSYN)  IV 3.375 g (75/64/33 2951)   TPN CYCLIC-ADULT (ION) 99 mL/hr at 12/22/21 1844     LOS: 11 days   Darliss Cheney, MD [facility]  12/23/2021, 10:16 AM   *Please note that this is a verbal dictation therefore any spelling or grammatical errors are due to the "[place] [facility]" system interpretation.  Please page via [place] and do not message via secure chat for urgent patient care matters. Secure chat can be used for non urgent patient care matters.  How to contact the [facility] Attending or Consulting provider [place] or covering provider during after hours [place], for this patient?  Check the care team in [facility] and look for a) attending/consulting [facility] provider listed and b) the [facility] team listed. Page or secure chat [phone]. Log into [email or web address] and use [place]'s universal password to access. If you do not have the password, please contact the hospital operator. Locate the [facility] provider you are  looking for under [facility] and page to a number that you can be directly reached. If you still have difficulty reaching the provider, please page the [facility] (Director on Call) for the Hospitalists listed on amion for assistance.

## 2021-12-23 NOTE — Progress Notes (Signed)
RN adding new morphine syringe to pca pump when syringe cracked and began leaking contents.  Pharmacy was notified and pharmacist advised to write a note documenting waste since syringe was tubed from pharmacy and wast contents in stericycle.  A full syringe of morphine was wasted in med room stericycle by this RN and witnessed by Verdia Kuba and Hulen Skains.  A new syringe will be sent to floor from pharmacy.

## 2021-12-23 NOTE — Consult Note (Signed)
[place] Nurse ostomy consult note Stoma type/location: LMQ colostomy  Stomal assessment/size:  1 3/8" pink and budded Peristomal assessment: not assessed Treatment options for stomal/peristomal skin:  likely to need 2 3/4" pouch with barrier ring Output none yet Ostomy pouching: 2pc.  Education provided: Spouse at bedside.  WIll be present for ostomy teaching.  Patient in bed, very sore.  Tearful, does not want to change pouch at this time. We will begin teaching tomorrow.  Enrolled patient in [place] program: NO Will follow.  Domenic Moras MSN, RN, FNP-BC CWON Wound, Ostomy, Continence Nurse Pager [phone]

## 2021-12-23 NOTE — Progress Notes (Signed)
PHARMACY - TOTAL PARENTERAL NUTRITION CONSULT NOTE   Indication: complicated diverticulitis  Patient Measurements: Height: 5' (152.4 cm) Weight: 49.9 kg (110 lb 0.2 oz) IBW/kg (Calculated) : 45.5 TPN AdjBW (KG): 49.9 Body mass index is 21.48 kg/m. Usual Weight: 45 kg  Assessment:  71 yo F admitted on 10/7 who presented with weakness and 8lb weight loss over 2 weeks. Patient been eating "poorly" ~ 1 month at home with limited toleration d/t diverticulitis. (Has cutaneous drain in place since April 2023 for diverticulitis w/ abscess). Pharmacy has been consulted for TPN management to improve nutritional status in order to help stimulate positive post-op recovery.   Patient switched Boost supplements to Premier Protein supplements and notes nausea/vomiting improved. She has also been able to tolerate sherbet ice cream.   Patient underwent sigmoid colectomy and colostomy 10/17. Will continue TPN at current cycle and re-evaluate post-op.   Glucose / Insulin: A1c 5.4%; CBG 142-268 s/p Decadron 5 mg 10/17, CBG checks 4 times daily, vsSSI resumed 3 units/24hr Electrolytes: Lytes WNL and stable Renal: Scr <1 stable, BUN slightly up (24) Hepatic: Alb 2.9, prealbumin up from 7 > 15, LFTs WNL, TG 103 Intake / Output; MIVF: UOP: 0.7 ml/kg/hr; LBM 10/15, 125 mL drain output - still reporting nausea/vomiting  GI Imaging: 10/07 CTAP colon diverticulosis; no recurrent abscess; complicated acute on chronic sigmoid diverticulitis with progression since June GI Surgeries / Procedures:  10/10 LLQ abscess drain catheter exchange in IR Planning for GI operation- Hartman's procedure this week, pending repeat pre-albumin level (now up to 15) 10/17 sigmoid colectomy and colostomy (Hartman's procedure)  Central access: PICC (double-lumen) placed 10/08 TPN start date: 10/08  Nutritional Goals: Goal TPN rate is 70 mL/hr (provides 94 g of protein and 1792 kcals per day)  RD Assessment: Estimated  Needs Total Energy Estimated Needs: 1600-1800 Total Protein Estimated Needs: 80-95 grams Total Fluid Estimated Needs: >/= 1.6 L  Current Nutrition:  TPN + FLD Boost Breeze TID  Plan:  Continue to cycle TPN over 18 hours (GIR 2.37-4.27; providing 94g protein, meeting 100% of patients needs) - At 1800, begin TPN at 49 mL/hr for 1 hour, then increase to 99 mL/hr x 16 hours, then at 1100 on 10/17 reduce TPN to 49 mL/hr x 1 hour and stop Electrolytes in TPN: Na 73mq/L, K 424m/L, Ca 2m65mL, Mg 3mE46m, and Phos 18mm5m. Max Cl Add standard MVI and trace elements to TPN Resume vsSSI and CBG checks QID while on cycle TPN (0800, 1300, 1700, 2000) Glargine 5 units daily added by TRH - f/u CBGs closely Monitor TPN labs on Mon/Thurs  Thank you for involving pharmacy in this patient's care.  JenniRenold GentarmD, BCPS Clinical Pharmacist Clinical phone for 12/23/2021 until 3p is [phone] 12/23/2021 7:27 AM

## 2021-12-23 NOTE — Inpatient Diabetes Management (Signed)
Inpatient Diabetes Program Recommendations  AACE/ADA: New Consensus Statement on Inpatient Glycemic Control (2015)  Target Ranges:  Prepandial:   less than 140 mg/dL      Peak postprandial:   less than 180 mg/dL (1-2 hours)      Critically ill patients:  140 - 180 mg/dL   Lab Results  Component Value Date   GLUCAP 194 (H) 12/23/2021   HGBA1C 5.4 12/14/2021    Review of Glycemic Control  Latest Reference Range & Units 12/22/21 12:49 12/22/21 14:58 12/22/21 15:53 12/22/21 20:40 12/23/21 08:05 12/23/21 11:31  Glucose-Capillary 70 - 99 mg/dL 223 (H) 153 (H) 142 (H) 269 (H) 251 (H) 194 (H)  (H): Data is abnormally high  Current orders for Inpatient glycemic control: Novolog 0-6 units QID, Semglee 5 units QD, TPN cyclic 18 hrs. CBGs 4 x a day  Inpatient Diabetes Program Recommendations:    CBGs Q4H Novolog 0-9 units 4 x a day  Will continue to follow while inpatient.  Thank you, Reche Dixon, MSN, [place] Diabetes Coordinator Inpatient Diabetes Program [phone] (team pager from 8a-5p)

## 2021-12-24 ENCOUNTER — Encounter (HOSPITAL_COMMUNITY): Payer: Self-pay | Admitting: Internal Medicine

## 2021-12-24 DIAGNOSIS — K5732 Diverticulitis of large intestine without perforation or abscess without bleeding: Secondary | ICD-10-CM | POA: Diagnosis not present

## 2021-12-24 LAB — COMPREHENSIVE METABOLIC PANEL
ALT: 36 U/L (ref 0–44)
AST: 26 U/L (ref 15–41)
Albumin: 2.5 g/dL — ABNORMAL LOW (ref 3.5–5.0)
Alkaline Phosphatase: 41 U/L (ref 38–126)
Anion gap: 6 (ref 5–15)
BUN: 20 mg/dL (ref 8–23)
CO2: 29 mmol/L (ref 22–32)
Calcium: 9 mg/dL (ref 8.9–10.3)
Chloride: 102 mmol/L (ref 98–111)
Creatinine, Ser: 0.47 mg/dL (ref 0.44–1.00)
GFR, Estimated: >60 mL/min (ref >60)
Glucose, Bld: 196 mg/dL — ABNORMAL HIGH (ref 70–99)
Potassium: 3.9 mmol/L (ref 3.5–5.1)
Sodium: 137 mmol/L (ref 135–145)
Total Bilirubin: 0.3 mg/dL (ref 0.3–1.2)
Total Protein: 5.5 g/dL — ABNORMAL LOW (ref 6.5–8.1)

## 2021-12-24 LAB — GLUCOSE, CAPILLARY
Glucose-Capillary: 222 mg/dL — ABNORMAL HIGH (ref 70–99)
Glucose-Capillary: 189 mg/dL — ABNORMAL HIGH (ref 70–99)
Glucose-Capillary: 72 mg/dL (ref 70–99)
Glucose-Capillary: 161 mg/dL — ABNORMAL HIGH (ref 70–99)

## 2021-12-24 LAB — TRIGLYCERIDES: Triglycerides: 68 mg/dL (ref <150)

## 2021-12-24 LAB — MAGNESIUM: Magnesium: 1.9 mg/dL (ref 1.7–2.4)

## 2021-12-24 LAB — PHOSPHORUS: Phosphorus: 1.7 mg/dL — ABNORMAL LOW (ref 2.5–4.6)

## 2021-12-24 MED ORDER — KETOROLAC TROMETHAMINE 15 MG/ML IJ SOLN
15.0000 mg | Freq: Three times a day (TID) | INTRAMUSCULAR | Status: DC
  Administered 2021-12-24 – 2021-12-28 (×14): 15 mg via INTRAVENOUS
  Filled 2021-12-24 (×15): qty 1

## 2021-12-24 MED ORDER — HYDROMORPHONE HCL 1 MG/ML IJ SOLN: 0.5000 mg | INTRAMUSCULAR | Status: DC | PRN

## 2021-12-24 MED ORDER — SODIUM CHLORIDE 0.9 % IV BOLUS
500.0000 mL | Freq: Once | INTRAVENOUS | Status: AC
  Administered 2021-12-24: 500 mL via INTRAVENOUS

## 2021-12-24 MED ORDER — TRACE MINERALS CU-MN-SE-ZN 300-55-60-3000 MCG/ML IV SOLN
INTRAVENOUS | Status: AC
  Filled 2021-12-24: qty 627.2

## 2021-12-24 MED ORDER — SODIUM PHOSPHATES 45 MMOLE/15ML IV SOLN
15.0000 mmol | Freq: Once | INTRAVENOUS | Status: AC
  Administered 2021-12-24: 15 mmol via INTRAVENOUS
  Filled 2021-12-24: qty 5

## 2021-12-24 MED ORDER — OXYCODONE HCL 5 MG PO TABS: 2.5000 mg | ORAL_TABLET | ORAL | Status: DC | PRN

## 2021-12-24 NOTE — Discharge Instructions (Addendum)
Colostomy Nutrition Therapy   This handout will provide some basic information about the effects of the colostomy surgery on digestion, absorption, and dietary changes for better results.  Colostomy surgery is when part of the large intestine (colon) is removed or bypassed. The remaining part of the functioning colon is brought through the abdominal wall, creating a stoma. It can be temporary or permanent depending on the surgery. After surgery, your bowel is swollen and you should avoid high-fiber foods. This is because they are harder to digest, and avoiding them will allow the bowel to heal and to avoid blockage of the colostomy. The eating plan begins with clear liquids and then should be advanced to a fiber-restricted diet before leaving the hospital. If you have lactose intolerance, then use lactose-free milk products. Chew your food slowly and well to help reduce the risk of blockage of the ostomy. As you recover, you will start eating solid foods, beginning with foods that are low in fiber (see Recommended Foods). The fiber-restricted diet should contain less than 13 grams of fiber for the whole day and may be less than 8 grams fiber per day depending on your symptoms. Most patients begin to eat more normally 6 weeks after surgery.   Tips Take small bites of foods and chew thoroughly for better digestion and absorption of nutrients. Some foods may cause blockages, especially if eaten in large amounts or not chewed well. Use caution when eating these foods, eat small amounts only, and chew them thoroughly, which will also help with better absorption of nutrients. You may find that your appetite is not as good as before surgery, so eating small amounts about every 2-4 hours is recommended. Keeping a regular schedule for meals and snacks can help reduce gas and result in better absorption of the nutrients from the foods. Eat meals and snacks at the same times each day. Eating the largest meal in  the middle of the day may decrease stool output at night, making it easier for you to get a full night's rest. Avoid acidic, spicy, fried or greasy foods, as well as foods that are high in sugar (candy, cake, pies, cookies, and sugary drinks). All of these foods can cause diarrhea. Foods that can thicken stools include bananas, applesauce, rice, and pasta. Some foods may cause odors or increase gas production (see Foods Not Recommended list). To reduce gas, avoid chewing gum, drinking with straws, carbonated beverages, smoking or chewing tobacco, eating too fast, and skipping meals. Missing meals can cause the small intestine to be more active and increase gas and watery stools. There is a "lag time"--the time from eating a gas-producing food and actual release of gas is 6-8 hours for distal colostomy patients. Limit foods that may cause gas or odor and choose foods that may decrease odor. Have at least 8 to 10 cups of fluids per day. You will need to drink more during hot weather, when you have increased stools, and at other times when you lose extra fluid, such as when you exercise. Fluid equivalents are: 1 ounce is 30 milliliters; 1 cup is 8 ounces; 8 cups is 64 ounces, or 2 quarts, or 2 liters. It is important to watch for signs and symptoms of fluid-electrolyte imbalance, which include dry mouth, reduced urine output, dark concentrated urine, feelings of dizziness upon standing, marked fatigue, and abdominal cramping. If these occur, seek prompt treatment. Ileostomy patients are more at risk for dehydration. Remember, high-potassium foods are needed more to offset the  effects of diarrhea. Add foods containing fiber gradually to your diet, but avoid those that can cause blockages initially. When you do add those foods, eat only very small portions and chew your food very well. Keep a food journal of foods tried and how you feel after eating them. Only try one new food every 3 days. You will need to eat  enough fiber and drink enough fluids to avoid constipation.  Use a chewable multivitamin with minerals (non-gummy) daily for best absorption. Use a chewable or liquid calcium supplement. Liquid has the best absorption.  Foods Recommended Some people are sensitive to some of the foods listed on the Recommended Foods chart. You may find that some of these foods cause you to have gas, unpleasant odors, or diarrhea. If this is the case, you should stop eating the foods that bother you. After 2 to 3 weeks, you can try small amounts to see whether they still cause symptoms.   Most of the recommended foods are low in fiber because fiber can be hard for your body to digest while you are recovering. When you first start eating solid foods, it is especially important that you stick to low-fiber choices (cooking vegetables and fruits or preparing canned items and removing skins and peels reduce the amount of fiber contained in these foods). As you heal, you can try foods with more fiber, such as whole grain foods.  Food Group Recommended Foods Notes  Milk and milk products Evaporated milk* Skim, low-fat milk, lactose free Skim, low-fat milk* Powdered milk* Buttermilk* Soy milk, rice milk or almond milk Yogurt* Kefir Cheese* Aged cheeses (cheddar cheese and Swiss cheese are lower in lactose) Low-fat ice cream* or sherbet* If you have diarrhea, try lactose-free milk and other lactose-free products. Lactose is a kind of sugar in milk that causes diarrhea in some people. Buttermilk, yogurt, and kefir can help keep your body from producing bad odors. Cheese can help thicken stools. It may be a good choice if you have diarrhea. Check the labels for calcium content of almond and rice milk for at least 30% calcium. These types of milk are not high in protein, so include other high-protein foods at meals and snacks to support healing. Foods marked with an asterisk (*) contain lactose.  Meats and other  protein foods Any meat or poultry prepared without added fat Smooth nut butters (limit serving size to 2 tablespoons or less) Fish Eggs Lactose free cottage cheese or other lactose free cheese Dried beans and peas can cause gas or bad odors. They should be avoided. For some people, fish, seafood, and eggs can cause bad odors. Try them in small amounts and see how your body reacts. Seafood, especially mollusks (oysters, clams and mussels), may cause blockage behind the stoma, so they must be chewed well. Avoid eating meats with casings, such as sausage or bratwurst. Peanut butter can help thicken stools. Smooth peanut butter may be a good choice if you have diarrhea. Do not eat chunky spreads.  Grains Bread, bagels, rolls, crackers, pasta, and cereals made from white or refined flour White rice At first, you should only choose grain foods that are made with refined grains (white flour and white rice). Check labels for less than 2 grams fiber per serving. White rice and pasta can help thicken stools. They may be good choices if you have diarrhea. As you recover, you can try foods made with whole grains (like whole wheat, brown rice, or oats). Start with small amounts  and see how you feel after eating them.  Vegetables Most well-cooked vegetables without seeds or skins Iceberg lettuce (< 1cup), begin with only a small amount shredded fine on sandwich and increase amount gradually week by week) Strained vegetable juice Potatoes without skins Some vegetables are more likely than others to cause gas, odors, or diarrhea. Many people develop gas or odors when they eat onions, garlic, or leeks. Vegetables in the cabbage family are also likely to cause problems. Avoid cabbages, broccoli, brussel sprouts, and cauliflower. Asparagus may cause bad odors. Potatoes can help thicken stools. They may be a good choice if you have diarrhea.  Fruits Most fruit juices (without pulp) Peeled fruit Canned fruit Fresh  fruit without edible seeds Juices may cause diarrhea; diluting with water can sometimes reduce diarrhea. Try only one type of juice for several days and watch symptoms. Avoid prune juice and grape juice, as they are more likely to cause diarrhea. Fruit peels should be avoided because they are high in fiber. Bananas and applesauce can help thicken stools. They may be good choices if you have diarrhea.  Fats and oils Butter, cream, cream cheese, margarine, mayonnaise, and oils When possible, choose healthy oils and fats, such as canola and olive oils. Limit to 8 teaspoons daily, as this may improve food tolerance and symptoms of discomfort.  Beverages             Any, except alcoholic beverages, prune juice, and grape juice         Carbonated drinks (such as soda) can cause gas. If you try them, start with a small amount. Alcoholic drinks (especially beer) can cause bad odors.  Others Avoid sugar substitutes, sugar alcohols such as xylitol and sorbitol. Cranberry juice can help keep your body from producing bad odors.   Foods Not Recommended When you first start eating solid foods, avoid foods that are high in fiber, such as whole grains, dried beans, and most raw vegetables and fruits. While you heal, you should also avoid any foods that cause you to experience odor, gas, diarrhea, or obstruction.  Foods That May Cause Blockage: Apples, unpeeled Dried fruit, raisins Relishes and olives  Bean sprouts Grapes Salad greens  Cabbage, raw Green peppers Seeds and nuts  Casing on sausage Mushrooms Spinach  Celery Nuts Tough, fibrous meats (for example, steak on grill)  Coconut Peas Vegetable and fruit skins  Coleslaw Pickles Whole grains  Corn Pineapple    Cucumbers Popcorn     Foods That May Cause Gas or Odor: Alcohol Cauliflower Grapes  Apples Cheeses, some types Green pepper  Asparagus Corn Melons  Bananas Cucumber Onions  Beer Dairy products Peanuts  Broccoli Dried beans and peas  Prunes  Brussels sprouts Eggs Radishes  Cabbage Fatty foods Turnips  Carbonated beverages Fish     Foods That May Discolor Stool: Beets Asparagus Spinach  Foods with red dye Broccoli     Foods That May Help Relieve Gas and Odor: Buttermilk Yogurt with active cultures  Cranberry juice Parsley   Foods That May Cause Diarrhea (Looser or More Frequent Stool): Alcohol (including beer) Fruit: fresh, canned, or dried Prune juice or prunes  Apricots (and stone fruits) Fruit juice: apple, grape, orange Raw vegetables  Beans, baked or legumes Gum, sugar free Spicy foods  Bran High-fat foods Soup  Broccoli High-sugar foods Sugar-free substitutes  Brussels sprouts Licorice Sugar-free foods containing mannitol or sorbitol  Cabbage Milk and dairy foods Tomatoes  Caffeinated drinks (especially hot) Nuts or seeds  Turnip greens/green leafy vegetables  Chocolate Peaches (stone fruit) Wine  Corn Peas Wheat/whole grains  Fried meats, fish, and poultry Plums (stone fruit)     Foods That May Help Thicken Stool: Applesauce Saltines Oatmeal (when acceptable to have fiber)  Bananas Tapioca Pasta (sauce may increase symptoms)  White rice, boiled Peanut butter, creamy White bread (not high in fiber)  Cheese Potatoes, no skin Barley (when acceptable to have fiber)  Marshmallows Pretzels Yogurt   Colostomy Sample 1-Day Menu View Nutrient Info Breakfast Omelet made with 2 egg whites 2 tablespoons grated cheese, for omelet 8 ounces cranberry juice (no pulp) (0.4 g fiber)  Morning Snack 1 English muffin (1.5 g fiber) 1 teaspoon margarine, for English muffin  Lunch 3 ounces pork loin, tender 1/2 cup white rice (0.5 g fiber) 1 teaspoon margarine, for french bread 1 slice [place] bread (0.5 g fiber) 1 small banana (3 g fiber) 1 cup lactose-free milk  Afternoon Snack 1 cup yogurt  Evening Meal 1 ounce (small handful) pretzels (0.5 g fiber) 2 cups iced tea 2 ounces [place] 1 ounce swiss cheese 2 slices  white bread 1/2 cup applesauce (1.5 g fiber)  Evening Snack 2 whole graham crackers (1.5 g fiber) 1 tablespoon smooth peanut butter (1 g fiber) 1 cup soy milk or lactose-free milk   Copyright [employer] of [employer].    MIDLINE WOUND CARE: - midline dressing to be changed twice daily - supplies: sterile saline, kerlix, scissors, ABD pads, tape  - remove dressing and all packing carefully, moistening with sterile saline as needed to avoid packing/internal dressing sticking to the wound. - clean edges of skin around the wound with water/gauze, making sure there is no tape debris or leakage left on skin that could cause skin irritation or breakdown. - dampen and clean kerlix with sterile saline and pack wound from wound base to skin level, making sure to take note of any possible areas of wound tracking, tunneling and packing appropriately. Wound can be packed loosely. Trim kerlix to size if a whole kerlix is not required. - cover wound with a dry ABD pad and secure with tape.  - write the date/time on the dry dressing/tape to better track when the last dressing change occurred. - apply any skin protectant/powder recommended by clinician to protect skin/skin folds. - change dressing as needed if leakage occurs, wound gets contaminated, or patient requests to shower. - may shower daily with wound open and following the shower the wound should be dried and a clean dressing placed.

## 2021-12-24 NOTE — Progress Notes (Signed)
Pt refused PM dressing change to midline abdomen

## 2021-12-24 NOTE — Progress Notes (Addendum)
[facility] Progress Note  2 Days Post-Op  Subjective: CC-  Cc abdominal pain. Also states she was very confused and had hallucinations yesterday evening, she was paranoid and thought her daughter and the RN were conspiring against her. Has not been OOB. Did not eat yesterday. She is less confused today.  Objective: Vital signs in last 24 hours: Temp:  [98 F (36.7 C)-99.7 F (37.6 C)] 98.3 F (36.8 C) (10/19 0824) Pulse Rate:  [99-115] 115 (10/19 0824) Resp:  [10-24] 24 (10/19 0824) BP: (112-171)/(63-73) 171/73 (10/19 0824) SpO2:  [92 %-96 %] 94 % (10/19 0824) Last BM Date : 12/21/21  Intake/Output from previous day: 10/18 0701 - 10/19 0700 In: 1162 [P.O.:300; I.V.:786.2; IV Piggyback:75.8] Out: 825 [Urine:800; Drains:25] Intake/Output this shift: No intake/output data recorded.  PE: Gen:  Alert, NAD, pleasant Card:  RRR Pulm:  CTAB, no W/R/R, rate and effort normal Abd: Soft, appropriately tender, patient with voluntary guarding. RLQ drain SS, midline wound w/ dressing c/d/I. LLQ ostomy pink and viable, no gas or stool in pouch. Msk: no lower extremity edema. RUE PICC Lab Results:  Recent Labs    12/23/21 1345  WBC 10.5  HGB 9.7*  HCT 28.5*  PLT 219    BMET Recent Labs    12/23/21 0446 12/24/21 0449  NA 136 137  K 4.0 3.9  CL 103 102  CO2 27 29  GLUCOSE 240* 196*  BUN 21 20  CREATININE 0.51 0.47  CALCIUM 9.2 9.0   PT/INR No results for input(s): "LABPROT", "INR" in the last 72 hours. CMP     Component Value Date/Time   NA 137 12/24/2021 0449   NA 141 04/07/2016 1348   K 3.9 12/24/2021 0449   CL 102 12/24/2021 0449   CO2 29 12/24/2021 0449   GLUCOSE 196 (H) 12/24/2021 0449   BUN 20 12/24/2021 0449   BUN 16 04/07/2016 1348   CREATININE 0.47 12/24/2021 0449   CREATININE 0.62 09/16/2015 1245   CALCIUM 9.0 12/24/2021 0449   PROT 5.5 (L) 12/24/2021 0449   PROT 6.7 04/07/2016 1348   ALBUMIN 2.5 (L) 12/24/2021 0449   ALBUMIN 4.5  04/07/2016 1348   AST 26 12/24/2021 0449   ALT 36 12/24/2021 0449   ALKPHOS 41 12/24/2021 0449   BILITOT 0.3 12/24/2021 0449   BILITOT 0.4 04/07/2016 1348   GFRNONAA >60 12/24/2021 0449   GFRAA >60 06/03/2016 1101   Lipase     Component Value Date/Time   LIPASE 23 12/11/2021 1644       Studies/Results: No results found.  Anti-infectives: Anti-infectives (From admission, onward)    Start     Dose/Rate Route Frequency Ordered Stop   12/17/21 1400  piperacillin-tazobactam (ZOSYN) IVPB 3.375 g        3.375 g 12.5 mL/hr over 240 Minutes Intravenous Every 8 hours 12/17/21 1052 12/27/21 2359   12/12/21 1400  piperacillin-tazobactam (ZOSYN) IVPB 3.375 g        3.375 g 12.5 mL/hr over 240 Minutes Intravenous Every 8 hours 12/12/21 0738 12/17/21 2011   12/12/21 0745  piperacillin-tazobactam (ZOSYN) IVPB 3.375 g        3.375 g 100 mL/hr over 30 Minutes Intravenous  Once 12/12/21 0740 12/12/21 0922   12/12/21 0300  cefTRIAXone (ROCEPHIN) 1 g in sodium chloride 0.9 % 100 mL IVPB        1 g 200 mL/hr over 30 Minutes Intravenous  Once 12/12/21 0252 12/12/21 0527        Assessment/Plan  Acute on chronic sigmoid diverticulitis with perforation, colonic fistula with IR drain - Afebrile, HR 102-117 bpm, WBC 10 yesterday - BMP looks ok - BUN/Cr normal, hyperglycemic - pts confusion and paranoia started after I gave her robaxin yesterday. Will D/C this med. Will also D/C PCA as pts family is pushing it for her.  - continue blake drain - start BID moist-to-dry dressing changes - WOC RN for new ostomy education - mobilize and IS  Pain: scheduled tylenol, start toradol 15 mg q 8h, low dose oxycodone 2.5-5.0 mg q4h PRN, lidoderm patch, 0.5 mg dilaudid q 2h PRN. FEN: FLD, protein shakes ID: [email or web address] >> plan to continue for 5 days post-op. Foley: remove foley today POD#2, give 500 cc bolus. Dispo: Continue IV abx, FLD, TPN. await bowel function   I reviewed last 24 h vitals and pain  scores, last 48 h intake and output, last 24 h labs and trends, and last 24 h imaging results.    LOS: 12 days    [facility] 12/24/2021, 8:47 AM Please see Amion for pager number during day hours 7:00am-4:30pm

## 2021-12-24 NOTE — Progress Notes (Deleted)
Mobility Specialist Progress Note   12/24/21 1430  Mobility  Activity Transferred from bed to chair  Level of Assistance Minimal assist, patient does 75% or more  Assistive Device Front wheel walker  Distance Ambulated (ft) 4 ft  Activity Response Tolerated well  $Mobility charge 1 Mobility   Pre Mobility: 87 HR, 95% SpO2 During Mobility: 94 HR, 94% SpO2 Post Mobility: 89 HR, BP, 95% SpO2  Received pt in bed stating to have had a BM. Pt able to get EOB and standing w/ minA for physical assistance. Assisted in pericare, pt able to stand for ~4 min w/o complaint. Transferred to chair w/o fault, linens changed. NT present in room.  Holland Falling Mobility Specialist Acute Rehab Office:  [phone]

## 2021-12-24 NOTE — Progress Notes (Signed)
Progress Note    Tracy Estrada  AST:[id number] DOB: 1951/02/26  DOA: 12/11/2021 PCP: [name], NP      Brief Narrative:    Medical records reviewed and are as summarized below:  Tracy Estrada is a 71 y.o. female with past medical history significant for hyperlipidemia, recurrent UTIs, degenerative disc disease, fibromyalgia, anxiety, depression, diverticulitis with abscess s/p cutaneous drain in April 2023.  She presented with generalized weakness subjective fever and chills.  She also complained of nausea, vomiting and decreased appetite.   She was admitted in April and was managed for perforated diverticulitis and was managed nonoperatively and eventually had IR drain placed, and was discharged.    She was found to have acute on chronic sigmoid diverticulitis with perforation, persistent sigmoid colonic fistula.  Started on TPN, full liquid diet.  General surgery performed Hartman's procedure on 12/22/2021.       Assessment/Plan:   Principal Problem:   Sigmoid diverticulitis Active Problems:   Urinary tract infection   Bilateral hydronephrosis   Anxiety and depression   Hyperlipidemia   Nutrition Problem: Moderate Malnutrition Etiology: acute illness  Signs/Symptoms: energy intake < 75% for > 7 days, percent weight loss, mild fat depletion, mild muscle depletion (5% wt loss in 2 weeks) Percent weight loss: 5 %   Body mass index is 21.48 kg/m.   Acute on chronic sigmoid diverticulitis with perforation/colonic fistula:  Found to have acute on chronic sigmoid diverticulitis with perforation, persistent sigmoid colonic fistula.  Started on TPN, full liquid diet.  Underwent drain exchange by radiology on 12/15/2021.  Now s/p Hartman's procedure on 12/22/2021.  Continue analgesics as needed for pain.  Continue IV Zosyn.  She is on TPN for nutrition.  Follow-up with general surgeon.  Urinary tract infection/bilateral hydronephrosis: UA was positive for  leukocytes, nitrates, bacteria.  Patient was also having increased frequency.  CT showed mild bilateral hydronephrosis without signs of stricture or fistula.   urine culture showed Klebsiella which is pansensitive.   Already on Zosyn   Anxiety/ depression: On Wellbutrin   Hyperlipidemia: On pravastatin   Hyperglycemia: Does not have history of diabetes.  Hemoglobin A1c 5.4 however currently hyperglycemic TPN is likely causing hyperglycemia.  10 units of insulin will be added to TPN.  Discontinue insulin glargine.  Discussed with Vincente Liberty, pharmacist.      Diet Order             Diet full liquid Room service appropriate? Yes; Fluid consistency: Thin  Diet effective now                            Consultants: General surgeon Interventional radiologist  Procedures: Hartman's procedure on 12/22/2021    Medications:    acetaminophen  1,000 mg Oral Q6H   alvimopan  12 mg Oral BID   buPROPion  300 mg Oral Daily   busPIRone  5 mg Oral BID   Chlorhexidine Gluconate Cloth  6 each Topical Daily   enoxaparin (LOVENOX) injection  40 mg Subcutaneous Q24H   feeding supplement  1 Container Oral TID BM   insulin aspart  0-6 Units Subcutaneous 4 times per day   ketorolac  15 mg Intravenous Q8H   lidocaine  1 patch Transdermal Q24H   lip balm   Topical BID   pantoprazole (PROTONIX) IV  40 mg Intravenous QHS   sodium chloride flush  10-40 mL Intracatheter Q12H   sodium chloride flush  3 mL Intravenous Q12H   sodium chloride flush  5 mL Intracatheter Q8H   Continuous Infusions:  ondansetron (ZOFRAN) IV     piperacillin-tazobactam (ZOSYN)  IV 3.375 g (12/24/21 0509)   sodium chloride     sodium phosphate 15 mmol in dextrose 5 % 250 mL infusion     TPN CYCLIC-ADULT (ION) 99 mL/hr at 41/66/06 3016   TPN CYCLIC-ADULT (ION)       Anti-infectives (From admission, onward)    Start     Dose/Rate Route Frequency Ordered Stop   12/17/21 1400  piperacillin-tazobactam (ZOSYN)  IVPB 3.375 g        3.375 g 12.5 mL/hr over 240 Minutes Intravenous Every 8 hours 12/17/21 1052 12/27/21 2359   12/12/21 1400  piperacillin-tazobactam (ZOSYN) IVPB 3.375 g        3.375 g 12.5 mL/hr over 240 Minutes Intravenous Every 8 hours 12/12/21 0738 12/17/21 2011   12/12/21 0745  piperacillin-tazobactam (ZOSYN) IVPB 3.375 g        3.375 g 100 mL/hr over 30 Minutes Intravenous  Once 12/12/21 0740 12/12/21 0922   12/12/21 0300  cefTRIAXone (ROCEPHIN) 1 g in sodium chloride 0.9 % 100 mL IVPB        1 g 200 mL/hr over 30 Minutes Intravenous  Once 12/12/21 0252 12/12/21 0527              Family Communication/Anticipated D/C date and plan/Code Status   DVT prophylaxis: enoxaparin (LOVENOX) injection 40 mg Start: 12/23/21 0800     Code Status: Full Code  Family Communication: Plan discussed with her husband and daughters at the bedside Disposition Plan: To be determined   Status is: Inpatient Remains inpatient appropriate because: S/p Hartman's procedure, uncontrolled abdominal pain       Subjective:   C/o severe abdominal pain.  Her husband, daughters at bedside  Objective:    Vitals:   12/24/21 0600 12/24/21 0606 12/24/21 0824 12/24/21 0853  BP:   (!) 171/73   Pulse:   (!) 115   Resp: 20 16 (!) 24 (!) 36  Temp:   98.3 F (36.8 C)   TempSrc:   Oral   SpO2: 96% 96% 94% 97%  Weight:      Height:       No data found.   Intake/Output Summary (Last 24 hours) at 12/24/2021 1004 Last data filed at 12/24/2021 0900 Gross per 24 hour  Intake 1161.98 ml  Output 825 ml  Net 336.98 ml   Filed Weights   12/11/21 1614 12/17/21 0900  Weight: 49.9 kg 49.9 kg    Exam:  GEN: NAD SKIN: Warm and dry EYES: No pallor or icterus ENT: MMM CV: RRR PULM: CTA B ABD: soft, ND, mid abdominal tenderness without rebound tenderness or guarding, +BS, +colostomy, RLQ JP drain CNS: AAO x 3, non focal EXT: No edema or tenderness GU: Foley catheter draining amber  urine       Data Reviewed:   I have personally reviewed following labs and imaging studies:  Labs: Labs show the following:   Basic Metabolic Panel: Recent Labs  Lab 12/18/21 0214 12/19/21 0310 12/21/21 0424 12/23/21 0446 12/24/21 0449  NA 141 138 138 136 137  K 4.0 3.7 4.0 4.0 3.9  CL 104 102 105 103 102  CO2 '28 28 28 27 29  '$ GLUCOSE 131* 141* 149* 240* 196*  BUN 18 19 24* 21 20  CREATININE 0.59 0.58 0.50 0.51 0.47  CALCIUM 9.9 9.7 9.5 9.2 9.0  MG 2.3 2.0 2.1  --  1.9  PHOS 4.1 3.6 3.0  --  1.7*   GFR Estimated Creatinine Clearance: 45 mL/min (by C-G formula based on SCr of 0.47 mg/dL). Liver Function Tests: Recent Labs  Lab 12/21/21 0424 12/24/21 0449  AST 17 26  ALT 16 36  ALKPHOS 35* 41  BILITOT 0.3 0.3  PROT 6.0* 5.5*  ALBUMIN 2.9* 2.5*   No results for input(s): "LIPASE", "AMYLASE" in the last 168 hours. No results for input(s): "AMMONIA" in the last 168 hours. Coagulation profile No results for input(s): "INR", "PROTIME" in the last 168 hours.  CBC: Recent Labs  Lab 12/23/21 1345  WBC 10.5  HGB 9.7*  HCT 28.5*  MCV 100.0  PLT 219   Cardiac Enzymes: No results for input(s): "CKTOTAL", "CKMB", "CKMBINDEX", "TROPONINI" in the last 168 hours. BNP (last 3 results) No results for input(s): "PROBNP" in the last 8760 hours. CBG: Recent Labs  Lab 12/23/21 0805 12/23/21 1131 12/23/21 1621 12/23/21 2016 12/24/21 0804  GLUCAP 251* 194* 104* 159* 222*   D-Dimer: No results for input(s): "DDIMER" in the last 72 hours. Hgb A1c: No results for input(s): "HGBA1C" in the last 72 hours. Lipid Profile: Recent Labs    12/24/21 0449  TRIG 68   Thyroid function studies: No results for input(s): "TSH", "T4TOTAL", "T3FREE", "THYROIDAB" in the last 72 hours.  Invalid input(s): "FREET3" Anemia work up: No results for input(s): "VITAMINB12", "FOLATE", "FERRITIN", "TIBC", "IRON", "RETICCTPCT" in the last 72 hours. Sepsis Labs: Recent Labs  Lab  12/23/21 1345  WBC 10.5    Microbiology Recent Results (from the past 240 hour(s))  Surgical pcr screen     Status: None   Collection Time: 12/22/21  5:54 AM   Specimen: Nasal Mucosa; Nasal Swab  Result Value Ref Range Status   MRSA, PCR NEGATIVE NEGATIVE Final   Staphylococcus aureus NEGATIVE NEGATIVE Final    Comment: (NOTE) The Xpert SA Assay (FDA approved for NASAL specimens in patients 79 years of age and older), is one component of a comprehensive surveillance program. It is not intended to diagnose infection nor to guide or monitor treatment. Performed at [facility], [place] [address], [place], [place] [address]     Procedures and diagnostic studies:  No results found.             LOS: 12 days   [place] Copywriter, advertising on [email or web address]. If 7PM-7AM, please contact night-coverage at [email or web address]     12/24/2021, 10:04 AM

## 2021-12-24 NOTE — Evaluation (Signed)
Physical Therapy Evaluation Patient Details Name: Tracy Estrada MRN: [id number] DOB: [birth year] Today's Date: 12/24/2021  History of Present Illness  71 y/o female presented to ED on 12/11/21 for abdominal pain, lack of appetite, and weight loss. Admitted for acute on chronic complicated sigmoid diverticulitis. S/p sigmoid colectomy and colostomy on 10/17. PMH: recurrent UTIs, diverticulitis, colostomy, anxiety/depression, fibromyalgia  Clinical Impression  Patient admitted with the above. PTA, patient lives with husband and was independent. Patient presents with weakness, impaired balance, decreased activity tolerance, pain, and impaired cognition. Per daughter and husband, patient cognition is not at baseline and demos memory deficits and general confusion. Max encouragement provided to participate in therapy due to pain despite use of PCA pump at beginning of session. Required maxA for bed mobility and only able to tolerate ~15 seconds of sitting upright prior to returning to supine. Patient will benefit from skilled PT services during acute stay to address listed deficits. Recommend HHPT at discharge to maximize functional mobility but will follow closely to determine progress from patient.        Recommendations for follow up therapy are one component of a multi-disciplinary discharge planning process, led by the attending physician.  Recommendations may be updated based on patient status, additional functional criteria and insurance authorization.  Follow Up Recommendations Home health PT      Assistance Recommended at Discharge Frequent or constant Supervision/Assistance  Patient can return home with the following  A little help with walking and/or transfers;A little help with bathing/dressing/bathroom;Assistance with cooking/housework;Assist for transportation;Help with stairs or ramp for entrance;Direct supervision/assist for medications management;Direct supervision/assist for financial  management    Equipment Recommendations Rolling Bryden Darden (2 wheels)  Recommendations for Other Services       Functional Status Assessment Patient has had a recent decline in their functional status and demonstrates the ability to make significant improvements in function in a reasonable and predictable amount of time.     Precautions / Restrictions Precautions Precautions: Fall Precaution Comments: colostomy, drain on R side Restrictions Weight Bearing Restrictions: No      Mobility  Bed Mobility Overal bed mobility: Needs Assistance Bed Mobility: Supine to Sit, Sit to Supine     Supine to sit: Max assist Sit to supine: Max assist   General bed mobility comments: instructed on log roll technique and use of bed rails to assist. Hand over hand assist for hand placement. Required maxA for all aspects of bed mobility    Transfers                   General transfer comment: returning to supine after x 15 seconds of sitting on EOB with maxA due to significant pain in abdomen    Ambulation/Gait                  Stairs            Wheelchair Mobility    Modified Rankin (Stroke Patients Only)       Balance Overall balance assessment: Needs assistance Sitting-balance support: Single extremity supported, Feet supported Sitting balance-Leahy Scale: Poor Sitting balance - Comments: maxA to maintain sitting balance                                     Pertinent Vitals/Pain Pain Assessment Pain Assessment: Faces Faces Pain Scale: Hurts whole lot Pain Location: abdomen with movement Pain Descriptors / Indicators: Grimacing, Guarding Pain Intervention(s):  Monitored during session    Home Living Family/patient expects to be discharged to:: Private residence Living Arrangements: Spouse/significant other Available Help at Discharge: Family;Available 24 hours/day Type of Home: House Home Access: Stairs to enter Entrance Stairs-Rails:  Psychiatric nurse of Steps: 4   Home Layout: One level Home Equipment: Grab bars - tub/shower;Shower [place] (2 wheels) Additional Comments: Pt has a very supportive husband who can assist as needed.    Prior Function Prior Level of Function : Independent/Modified Independent;Driving                     Hand Dominance        Extremity/Trunk Assessment             Cervical / Trunk Assessment Cervical / Trunk Assessment: Other exceptions Cervical / Trunk Exceptions: abdominal sx; colostomy  Communication   Communication: No difficulties  Cognition Arousal/Alertness: Awake/alert Behavior During Therapy: Anxious Overall Cognitive Status: Impaired/Different from baseline Area of Impairment: Attention, Memory, Following commands, Safety/judgement, Awareness, Problem solving                   Current Attention Level: Sustained Memory: Decreased short-term memory Following Commands: Follows one step commands with increased time Safety/Judgement: Decreased awareness of deficits Awareness: Intellectual Problem Solving: Slow processing, Difficulty sequencing, Requires verbal cues General Comments: STM deficits noted during session. Anxious with mobility. Daughter and husband state she is confused compared to her normal.        General Comments      Exercises     Assessment/Plan    PT Assessment Patient needs continued PT services  PT Problem List Decreased strength;Decreased activity tolerance;Decreased balance;Decreased mobility;Cardiopulmonary status limiting activity;Decreased safety awareness       PT Treatment Interventions DME instruction;Gait training;Functional mobility training;Therapeutic activities;Therapeutic exercise;Balance training;Stair training;Patient/family education    PT Goals (Current goals can be found in the Care Plan section)  Acute Rehab PT Goals Patient Stated Goal: did not state PT Goal  Formulation: With patient/family Time For Goal Achievement: 01/07/22 Potential to Achieve Goals: Good    Frequency Min 3X/week     Co-evaluation               AM-PAC PT "6 Clicks" Mobility  Outcome Measure Help needed turning from your back to your side while in a flat bed without using bedrails?: A Lot Help needed moving from lying on your back to sitting on the side of a flat bed without using bedrails?: A Lot Help needed moving to and from a bed to a chair (including a wheelchair)?: Total Help needed standing up from a chair using your arms (e.g., wheelchair or bedside chair)?: Total Help needed to walk in hospital room?: Total Help needed climbing 3-5 steps with a railing? : Total 6 Click Score: 8    End of Session   Activity Tolerance: Patient limited by pain;Patient limited by fatigue Patient left: in bed;with call bell/phone within reach;with bed alarm set;with family/visitor present Nurse Communication: Mobility status PT Visit Diagnosis: Unsteadiness on feet (R26.81);Muscle weakness (generalized) (M62.81);Other abnormalities of gait and mobility (R26.89)    Time: 1610-9604 PT Time Calculation (min) (ACUTE ONLY): 24 min   Charges:   PT Evaluation $PT Eval Moderate Complexity: 1 Mod PT Treatments $Therapeutic Activity: 8-22 mins        Trella Thurmond A. Gilford Rile PT, DPT Acute Rehabilitation Services Office [phone]   Linna Hoff 12/24/2021, 1:09 PM

## 2021-12-24 NOTE — Progress Notes (Signed)
MEWS went to yellow for a short while ( less than and hour) Heartrate was up during dressing change. Quickly resumed to normal rate. Also appeared as though oxygen level dropped during dressing changes however pt noted to be holding her breathe. Again educated family that pt must be awake and push own button for PCA. Dressing to abdomen done and pt tolerated fairly well. Family remained at bedside throughout shift. Pain controlled by PCA

## 2021-12-24 NOTE — Progress Notes (Signed)
PHARMACY - TOTAL PARENTERAL NUTRITION CONSULT NOTE   Indication: complicated diverticulitis  Patient Measurements: Height: 5' (152.4 cm) Weight: 49.9 kg (110 lb 0.2 oz) IBW/kg (Calculated) : 45.5 TPN AdjBW (KG): 49.9 Body mass index is 21.48 kg/m. Usual Weight: 45 kg  Assessment:  71 yo F admitted on 10/7 who presented with weakness and 8lb weight loss over 2 weeks. Patient been eating "poorly" ~ 1 month at home with limited toleration d/t diverticulitis. (Has cutaneous drain in place since April 2023 for diverticulitis w/ abscess). Pharmacy has been consulted for TPN management to improve nutritional status in order to help stimulate positive post-op recovery.   Patient switched Boost supplements to Premier Protein supplements and notes nausea/vomiting improved. She has also been able to tolerate sherbet ice cream.   Patient underwent sigmoid colectomy and colostomy 10/17. Will continue TPN at current cycle and re-evaluate post-op.   Glucose / Insulin: A1c 5.4%; CBG 159-222 s/p Decadron 5 mg 10/17, CBG checks 4 times daily, vsSSI resumed 3 units/24hr  (received glargine 5units 10/18) Electrolytes: K 3.9, Phos down at 1.7, others wnl and stable Renal: Scr <1 stable, BUN slightly up (24) Hepatic: Alb 2.9, prealbumin up from 7 > 15, LFTs WNL, TG 103 Intake / Output; MIVF: UOP: 400 mL charted, LBM 10/16, 45 mL drain output - still reporting nausea/vomiting  GI Imaging: 10/07 CTAP colon diverticulosis; no recurrent abscess; complicated acute on chronic sigmoid diverticulitis with progression since June GI Surgeries / Procedures:  10/10 LLQ abscess drain catheter exchange in IR Planning for GI operation- Hartman's procedure this week, pending repeat pre-albumin level (now up to 15) 10/17 sigmoid colectomy and colostomy (Hartman's procedure)  Central access: PICC (double-lumen) placed 10/08 TPN start date: 10/08  Nutritional Goals: Goal TPN rate is 70 mL/hr (provides 94 g of protein  and 1792 kcals per day)  RD Assessment: Estimated Needs Total Energy Estimated Needs: 1600-1800 Total Protein Estimated Needs: 80-95 grams Total Fluid Estimated Needs: >/= 1.6 L  Current Nutrition:  TPN + FLD Boost Breeze TID  Plan:  Continue to cycle TPN over 18 hours (GIR 2.37-4.27; providing 94g protein, meeting 100% of patients needs) - At 1800, begin TPN at 49 mL/hr for 1 hour, then increase to 99 mL/hr x 16 hours, then at 1100 on 10/17 reduce TPN to 49 mL/hr x 1 hour and stop Electrolytes in TPN: Na 75mq/L, K 466m/L, Ca 5m75mL, Mg 3mE72m, increase Phos to 24 mmol/L. Max Cl X1 IV Na Phos 15 mmol outside of TPN Add standard MVI and trace elements to TPN Add insulin regular 10 units to TPN Continue vsSSI and CBG checks QID while on cycle TPN (0800, 1300, 1700, 2000) Monitor TPN labs on Mon/Thurs   Thank you for allowing pharmacy to be a part of this patient's care.  AdriArdyth HarpsarmD Clinical Pharmacist

## 2021-12-24 NOTE — Evaluation (Signed)
Occupational Therapy Evaluation Patient Details Name: Tracy Estrada MRN: [id number] DOB: 04-22-1950 Today's Date: 12/24/2021   History of Present Illness 71 y/o female presented to ED on 12/11/21 for abdominal pain, lack of appetite, and weight loss. Admitted for acute on chronic complicated sigmoid diverticulitis. S/p sigmoid colectomy and colostomy on 10/17. PMH: recurrent UTIs, diverticulitis, colostomy, anxiety/depression, fibromyalgia   Clinical Impression   Pt is typically independent and lives with her supportive husband. Pt presents with abdominal pain/burning, generalized weakness, decreased standing balance and impaired cognition. She requires mod assist for bed mobility using log roll technique and stands with heavy min assist to take side steps along EOB. Pt needs set up to total assist for ADLs. She is likely to progress to be able to return home with her family's assist and HHOT upon medical readiness. Will follow acutely.      Recommendations for follow up therapy are one component of a multi-disciplinary discharge planning process, led by the attending physician.  Recommendations may be updated based on patient status, additional functional criteria and insurance authorization.   Follow Up Recommendations  Home health OT    Assistance Recommended at Discharge Frequent or constant Supervision/Assistance  Patient can return home with the following A little help with walking and/or transfers;A lot of help with bathing/dressing/bathroom;Assistance with cooking/housework;Assist for transportation;Help with stairs or ramp for entrance;Direct supervision/assist for financial management;Direct supervision/assist for medications management    Functional Status Assessment  Patient has had a recent decline in their functional status and demonstrates the ability to make significant improvements in function in a reasonable and predictable amount of time.  Equipment Recommendations   BSC/3in1    Recommendations for Other Services       Precautions / Restrictions Precautions Precautions: Fall Precaution Comments: colostomy, drain on R side Restrictions Weight Bearing Restrictions: No      Mobility Bed Mobility Overal bed mobility: Needs Assistance Bed Mobility: Rolling, Sidelying to Sit, Sit to Sidelying Rolling: Min guard Sidelying to sit: Mod assist     Sit to sidelying: Mod assist General bed mobility comments: educated in log roll technique, multimodal cues to follow, use of rail    Transfers Overall transfer level: Needs assistance Equipment used: 1 person hand held assist Transfers: Sit to/from Stand Sit to Stand: Min assist           General transfer comment: heavy min assist to stand and take side steps toward [facility]      Balance Overall balance assessment: Needs assistance   Sitting balance-Leahy Scale: Fair       Standing balance-Leahy Scale: Poor                             ADL either performed or assessed with clinical judgement   ADL Overall ADL's : Needs assistance/impaired Eating/Feeding: Set up;Bed level   Grooming: Minimal assistance;Sitting   Upper Body Bathing: Moderate assistance;Sitting   Lower Body Bathing: Sit to/from stand;Total assistance   Upper Body Dressing : Minimal assistance;Sitting   Lower Body Dressing: Total assistance;Sit to/from stand   Toilet Transfer: Minimal assistance   Toileting- Clothing Manipulation and Hygiene: Total assistance;Sit to/from stand               Vision Ability to See in Adequate Light: 0 Adequate Patient Visual Report: No change from baseline       Perception     Praxis      Pertinent Vitals/Pain Pain Assessment Pain Assessment:  Faces Faces Pain Scale: Hurts little more Pain Location: abdomen with movement Pain Descriptors / Indicators: Discomfort, Burning Pain Intervention(s): Monitored during session, Repositioned, Other (comment) (educated  in splinting abdomen with pillow)     Hand Dominance Right   Extremity/Trunk Assessment Upper Extremity Assessment Upper Extremity Assessment: Overall WFL for tasks assessed;Generalized weakness   Lower Extremity Assessment Lower Extremity Assessment: Defer to PT evaluation   Cervical / Trunk Assessment Cervical / Trunk Assessment: Other exceptions Cervical / Trunk Exceptions: abdominal sx; colostomy   Communication Communication Communication: No difficulties   Cognition Arousal/Alertness: Awake/alert Behavior During Therapy: WFL for tasks assessed/performed Overall Cognitive Status: Impaired/Different from baseline Area of Impairment: Following commands, Problem solving, Attention                   Current Attention Level: Selective Memory: Decreased short-term memory Following Commands: Follows one step commands with increased time     Problem Solving: Slow processing, Decreased initiation, Difficulty sequencing, Requires verbal cues General Comments: Improved cognition from this morning per family and PT note.     General Comments       Exercises     Shoulder Instructions      Home Living Family/patient expects to be discharged to:: Private residence Living Arrangements: Spouse/significant other Available Help at Discharge: Family;Available 24 hours/day Type of Home: House Home Access: Stairs to enter [employer] of Steps: 4 Entrance Stairs-Rails: Right;Left Home Layout: One level     Bathroom Shower/Tub: Occupational psychologist: Standard     Home Equipment: Grab bars - tub/shower;Shower Land (2 wheels)   Additional Comments: Pt has a very supportive husband who can assist as needed.      Prior Functioning/Environment Prior Level of Function : Independent/Modified Independent;Driving                        OT Problem List: Decreased strength;Impaired balance (sitting and/or standing);Decreased  cognition;Decreased safety awareness;Decreased knowledge of use of DME or AE;Pain      OT Treatment/Interventions: Self-care/ADL training;Therapeutic activities;Patient/family education;Balance training;Cognitive remediation/compensation;DME and/or AE instruction    OT Goals(Current goals can be found in the care plan section) Acute Rehab OT Goals OT Goal Formulation: With patient Time For Goal Achievement: 01/07/22 Potential to Achieve Goals: Good ADL Goals Pt Will Perform Grooming: with min guard assist;standing Pt Will Perform Lower Body Bathing: with min assist;sit to/from stand Pt Will Perform Lower Body Dressing: with min assist;sit to/from stand Pt Will Transfer to Toilet: with min guard assist;ambulating;bedside commode Pt Will Perform Toileting - Clothing Manipulation and hygiene: with min guard assist;sit to/from stand Additional ADL Goal #1: Pt will perform bed mobility using log roll technique with supervision.  OT Frequency: Min 2X/week    Co-evaluation              AM-PAC OT "6 Clicks" Daily Activity     Outcome Measure Help from another person eating meals?: A Little Help from another person taking care of personal grooming?: A Little Help from another person toileting, which includes using toliet, bedpan, or urinal?: Total Help from another person bathing (including washing, rinsing, drying)?: A Lot Help from another person to put on and taking off regular upper body clothing?: A Little Help from another person to put on and taking off regular lower body clothing?: Total 6 Click Score: 13   End of Session    Activity Tolerance: Patient tolerated treatment well Patient left: in bed;with call bell/phone  within reach;with bed alarm set;with family/visitor present  OT Visit Diagnosis: Pain;Other symptoms and signs involving cognitive function;Unsteadiness on feet (R26.81);Other abnormalities of gait and mobility (R26.89);Muscle weakness (generalized) (M62.81)                 Time: 2103-1281 OT Time Calculation (min): 27 min Charges:  OT General Charges $OT Visit: 1 Visit OT Evaluation $OT Eval Moderate Complexity: 1 Mod OT Treatments $Therapeutic Activity: 8-22 mins  Cleta Alberts, OTR/L Acute Rehabilitation Services Office: [phone]  Malka So 12/24/2021, 2:35 PM

## 2021-12-24 NOTE — Consult Note (Signed)
[facility] Nurse ostomy consult note Stoma type/location: LMQ colostomy, drain site in LLQ where drain was removed during surgery, suture in place.  Will protect with piece of barrier ring.  Stomal assessment/size: 1 1/2" round pink and moist Peristomal assessment: creasing at 9 o'clock and minimally at 3 o'clock.  Will use barrier ring MIdline surgical incision Treatment options for stomal/peristomal skin:  barrier ring to peristomal skin and over drain site.  2 piece 2 3/4" pouch, cut off center to avoid midline incision Output blood tinged liquid only in pouch Ostomy pouching: 2pc.  Barrier ring Education provided: spouse and 3 daughters at bedside to observe pouch change.  Patient is tearful when she sees stoma and keeps eyes closed afterwards.  We remove old pouch, cleanse skin and measure stoma.  Describe purpose of barrier ring and place around stoma and over drain removal site.  CUt barrier off center and leave pattern in room.  Assemble pouch and barrier and place pouch.  Demonstrate emptying and roll closure.  Discuss emptying when 1/3 full.  Twice weekly pouch changes, filtered pouches, and showering.  Mention outpatient ostomy clinic as a resource after discharge.  Enrolled patient in [employer] program: No Will follow.  Domenic Moras MSN, RN, FNP-BC CWON Wound, Ostomy, Continence Nurse Pager [phone]

## 2021-12-24 NOTE — Progress Notes (Addendum)
IR has been following patient for LLQ drain.   Patient underwent sigmoid colectomy and colostomy on 10/17. 14 fr LLQ drain was removed during the surgery.   No further IR needs at this time. IR remains available as needed, further plans per primary team. Please call with questions or concerns.  Armando Gang Danney Bungert PA-C 12/24/2021 9:41 AM

## 2021-12-25 DIAGNOSIS — K5732 Diverticulitis of large intestine without perforation or abscess without bleeding: Secondary | ICD-10-CM | POA: Diagnosis not present

## 2021-12-25 LAB — CBC
HCT: 24.6 % — ABNORMAL LOW (ref 36.0–46.0)
Hemoglobin: 8.2 g/dL — ABNORMAL LOW (ref 12.0–15.0)
MCH: 33.2 pg (ref 26.0–34.0)
MCHC: 33.3 g/dL (ref 30.0–36.0)
MCV: 99.6 fL (ref 80.0–100.0)
Platelets: 198 10*3/uL (ref 150–400)
RBC: 2.47 MIL/uL — ABNORMAL LOW (ref 3.87–5.11)
RDW: 12.8 % (ref 11.5–15.5)
WBC: 5.9 10*3/uL (ref 4.0–10.5)
nRBC: 0 % (ref 0.0–0.2)

## 2021-12-25 LAB — BASIC METABOLIC PANEL
Anion gap: 7 (ref 5–15)
BUN: 18 mg/dL (ref 8–23)
CO2: 29 mmol/L (ref 22–32)
Calcium: 9.1 mg/dL (ref 8.9–10.3)
Chloride: 105 mmol/L (ref 98–111)
Creatinine, Ser: 0.48 mg/dL (ref 0.44–1.00)
GFR, Estimated: >60 mL/min (ref >60)
Glucose, Bld: 160 mg/dL — ABNORMAL HIGH (ref 70–99)
Potassium: 3.7 mmol/L (ref 3.5–5.1)
Sodium: 141 mmol/L (ref 135–145)

## 2021-12-25 LAB — GLUCOSE, CAPILLARY
Glucose-Capillary: 151 mg/dL — ABNORMAL HIGH (ref 70–99)
Glucose-Capillary: 143 mg/dL — ABNORMAL HIGH (ref 70–99)
Glucose-Capillary: 92 mg/dL (ref 70–99)
Glucose-Capillary: 124 mg/dL — ABNORMAL HIGH (ref 70–99)

## 2021-12-25 LAB — PHOSPHORUS: Phosphorus: 3 mg/dL (ref 2.5–4.6)

## 2021-12-25 MED ORDER — BOOST PLUS PO LIQD: 237.0000 mL | Freq: Three times a day (TID) | ORAL | Status: DC | PRN

## 2021-12-25 MED ORDER — TRACE MINERALS CU-MN-SE-ZN 300-55-60-3000 MCG/ML IV SOLN
INTRAVENOUS | Status: AC
  Filled 2021-12-25: qty 627.2

## 2021-12-25 NOTE — Plan of Care (Signed)

## 2021-12-25 NOTE — Progress Notes (Signed)
[facility] Progress Note  3 Days Post-Op  Subjective: CC-  Cc is nausea. No vomiting. Pain overall better, stood up once yesterday. Has not take any narcotics since PCA was stopped. Foley remains in place.  Objective: Vital signs in last 24 hours: Temp:  [98.3 F (36.8 C)-98.9 F (37.2 C)] 98.9 F (37.2 C) (10/20 0800) Pulse Rate:  [108-110] 110 (10/20 0800) Resp:  [17-20] 17 (10/20 0800) BP: (124-130)/(61-68) 130/68 (10/20 0800) SpO2:  [86 %-94 %] 86 % (10/20 0800) Last BM Date : 12/21/21  Intake/Output from previous day: 10/19 0701 - 10/20 0700 In: 1087.4 [I.V.:931.8; IV Piggyback:150.6] Out: 1435 [Urine:1400; Drains:35] Intake/Output this shift: No intake/output data recorded.  PE: Gen:  Alert, NAD, pleasant Card:  RRR Pulm:  CTAB, no W/R/R, rate and effort normal Abd: Soft, appropriately tender, RLQ drain SS, midline wound w/ dressing c/d/I. LLQ ostomy pink and viable, small amt gas in ostomy pouch, no stool. Msk: no lower extremity edema. RUE PICC Lab Results:  Recent Labs    12/23/21 1345 12/25/21 0640  WBC 10.5 5.9  HGB 9.7* 8.2*  HCT 28.5* 24.6*  PLT 219 198    BMET Recent Labs    12/24/21 0449 12/25/21 0640  NA 137 141  K 3.9 3.7  CL 102 105  CO2 29 29  GLUCOSE 196* 160*  BUN 20 18  CREATININE 0.47 0.48  CALCIUM 9.0 9.1   PT/INR No results for input(s): "LABPROT", "INR" in the last 72 hours. CMP     Component Value Date/Time   NA 141 12/25/2021 0640   NA 141 04/07/2016 1348   K 3.7 12/25/2021 0640   CL 105 12/25/2021 0640   CO2 29 12/25/2021 0640   GLUCOSE 160 (H) 12/25/2021 0640   BUN 18 12/25/2021 0640   BUN 16 04/07/2016 1348   CREATININE 0.48 12/25/2021 0640   CREATININE 0.62 09/16/2015 1245   CALCIUM 9.1 12/25/2021 0640   PROT 5.5 (L) 12/24/2021 0449   PROT 6.7 04/07/2016 1348   ALBUMIN 2.5 (L) 12/24/2021 0449   ALBUMIN 4.5 04/07/2016 1348   AST 26 12/24/2021 0449   ALT 36 12/24/2021 0449   ALKPHOS 41  12/24/2021 0449   BILITOT 0.3 12/24/2021 0449   BILITOT 0.4 04/07/2016 1348   GFRNONAA >60 12/25/2021 0640   GFRAA >60 06/03/2016 1101   Lipase     Component Value Date/Time   LIPASE 23 12/11/2021 1644       Studies/Results: No results found.  Anti-infectives: Anti-infectives (From admission, onward)    Start     Dose/Rate Route Frequency Ordered Stop   12/17/21 1400  piperacillin-tazobactam (ZOSYN) IVPB 3.375 g        3.375 g 12.5 mL/hr over 240 Minutes Intravenous Every 8 hours 12/17/21 1052 12/27/21 2359   12/12/21 1400  piperacillin-tazobactam (ZOSYN) IVPB 3.375 g        3.375 g 12.5 mL/hr over 240 Minutes Intravenous Every 8 hours 12/12/21 0738 12/17/21 2011   12/12/21 0745  piperacillin-tazobactam (ZOSYN) IVPB 3.375 g        3.375 g 100 mL/hr over 30 Minutes Intravenous  Once 12/12/21 0740 12/12/21 0922   12/12/21 0300  cefTRIAXone (ROCEPHIN) 1 g in sodium chloride 0.9 % 100 mL IVPB        1 g 200 mL/hr over 30 Minutes Intravenous  Once 12/12/21 0252 12/12/21 0527        Assessment/Plan Acute on chronic sigmoid diverticulitis with perforation, colonic fistula with IR drain -  Afebrile, HR 110 bpm, WBC WNL - ileus, await bowel function. Continue TPN. Don't force PO intake but allow FLD as tolerated. - pts confusion and paranoia resolved w/ cessation of morphine and robaxin. - continue blake drain - BID moist-to-dry dressing changes - WOC RN for new ostomy education - mobilize and IS  Pain: scheduled tylenol, toradol 15 mg q 8h, low dose oxycodone 2.5-5.0 mg q4h PRN, lidoderm patch, 0.5 mg dilaudid q 2h PRN. FEN: FLD, protein shakes ID: Zosyn >> plan to continue for 5 days post-op. Foley: remove foley today, wrote for it to be removed yesterday but it was not removed. Encourage bedside commode, ok for purewick if we have to.  Dispo: Continue IV abx, FLD, TPN. await bowel function   I reviewed last 24 h vitals and pain scores, last 48 h intake and output, last  24 h labs and trends, and last 24 h imaging results.    LOS: 13 days    [facility] 12/25/2021, 9:36 AM Please see Amion for pager number during day hours 7:00am-4:30pm

## 2021-12-25 NOTE — Plan of Care (Signed)
  Problem: Clinical Measurements: Goal: Will remain free from infection Outcome: Progressing   Problem: Safety: Goal: Ability to remain free from injury will improve Outcome: Progressing   

## 2021-12-25 NOTE — Progress Notes (Signed)
Nutrition Follow-up  DOCUMENTATION CODES:   Not applicable  INTERVENTION:  TPN management per pharmacy Wouldn't recommend discontinuing TPN until pt tolerating diet and eating >/=75% of estimated needs Discontinue Magic cup TID Continue Premier Protein supplements from home Request updated weight to reassess   NUTRITION DIAGNOSIS:   Moderate Malnutrition related to acute illness as evidenced by energy intake < 75% for > 7 days, percent weight loss, mild fat depletion, mild muscle depletion (5% wt loss in 2 weeks).  Ongoing  GOAL:   Patient will meet greater than or equal to 90% of their needs  Goal met via TPN  MONITOR:   PO intake, Labs, I & O's, Supplement acceptance  REASON FOR ASSESSMENT:   Consult New TPN/TNA  ASSESSMENT:   71 y.o. female presented to the ED with weakness, N/V, decreased appetite. PMH includes HLD, fibromyalgia, anxiety, and diverticulitis  w/ cutaneous drains in place. Pt admitted with sigmoid diverticulitis and UTI.  10/17 s/p sigmoid colectomy and colostomy (hartman's procedure)  Per Surgery, pt now with ileus. Awaiting bowel function. Continue full liquid diet as tolerated.    Pt endorses ongoing nausea without emesis. She is not tolerating PO well. She did not enjoy the [employer] or magic cup. Both have been discontinued. Family bringing in [place] shakes which she keeps in a mini fridge in her room.   Cycle TPN over 18 hours (GIR 2.37-4.27; providing 94g protein, meeting 100% of patients needs) - insulin regular 10 units added to TPN 10/19  Medications: SSI 0-6 units QID, protonix, IV zofran, IV abx  Labs: CBG's 143, 151, 161, 72, 189, 222 x24 hours  UOP: 1.4L x24 hours I/O's: +10.5L since admit  Diet Order:   Diet Order             Diet full liquid Room service appropriate? Yes; Fluid consistency: Thin  Diet effective now                   EDUCATION NEEDS:   No education needs have been identified at this  time  Skin:  Skin Assessment: Reviewed RN Assessment  Last BM:  10/16 prior to colostomy  Height:   Ht Readings from Last 1 Encounters:  12/17/21 5' (1.524 m)    Weight:   Wt Readings from Last 1 Encounters:  12/17/21 49.9 kg    Ideal Body Weight:  45.5 kg  BMI:  Body mass index is 21.48 kg/m.  Estimated Nutritional Needs:   Kcal:  1600-1800  Protein:  80-95 grams  Fluid:  >/= 1.6 L  Tracy Estrada, RDN, LDN Clinical Nutrition

## 2021-12-25 NOTE — Progress Notes (Signed)
PHARMACY - TOTAL PARENTERAL NUTRITION CONSULT NOTE   Indication: complicated diverticulitis  Patient Measurements: Height: 5' (152.4 cm) Weight: 49.9 kg (110 lb 0.2 oz) IBW/kg (Calculated) : 45.5 TPN AdjBW (KG): 49.9 Body mass index is 21.48 kg/m. Usual Weight: 45 kg  Assessment:  71 yo F admitted on 10/7 who presented with weakness and 8lb weight loss over 2 weeks. Patient been eating "poorly" ~ 1 month at home with limited toleration d/t diverticulitis. (Has cutaneous drain in place since April 2023 for diverticulitis w/ abscess). Pharmacy has been consulted for TPN management to improve nutritional status in order to help stimulate positive post-op recovery.   Patient switched Boost supplements to Premier Protein supplements (brought in by family per patient preference on flavor options) and notes nausea/vomiting improved. She has also been able to tolerate sherbet ice cream. Encouraged to try FLD menu options for soups as tolerated.   Patient underwent sigmoid colectomy and colostomy 10/17. Will continue TPN at current cycle and re-evaluate post-op.   Glucose / Insulin: A1c 5.4%; CBG 159-222 s/p Decadron 5 mg 10/17, CBG checks 4 times daily, vsSSI resumed 3 units/24hr  (received glargine 5units 10/18) Electrolytes: K 3.9, Phos 1.7 > 3.0 (10/19 incr in TPN + NaPhos 81mol), others wnl and stable Renal: Scr <1 stable, BUN slightly up (24) Hepatic: Alb 2.9, prealbumin up from 7 > 15, LFTs WNL, TG 103 Intake / Output; MIVF: UOP: 400 mL charted, LBM 10/16, 45 mL drain output - still reporting nausea/vomiting  GI Imaging: 10/07 CTAP colon diverticulosis; no recurrent abscess; complicated acute on chronic sigmoid diverticulitis with progression since June GI Surgeries / Procedures:  10/10 LLQ abscess drain catheter exchange in IR Planning for GI operation- Hartman's procedure this week, pending repeat pre-albumin level (now up to 15) 10/17 sigmoid colectomy and colostomy (Hartman's  procedure)  Central access: PICC (double-lumen) placed 10/08 TPN start date: 10/08  Nutritional Goals: Goal TPN rate is 70 mL/hr (provides 94 g of protein and 1792 kcals per day)  RD Assessment: Estimated Needs Total Energy Estimated Needs: 1600-1800 Total Protein Estimated Needs: 80-95 grams Total Fluid Estimated Needs: >/= 1.6 L  Current Nutrition:  TPN + FLD Boost Breeze TID (none given per charting) > DC per pt preference >> Premier High Protein (30g/bottle) - family brings in  Plan:  Continue to cycle TPN over 18 hours (GIR 2.37-4.27; providing 94g protein, meeting 100% of patients needs) - At 1800, begin TPN at 49 mL/hr for 1 hour, then increase to 99 mL/hr x 16 hours, then at 1100 on 10/17 reduce TPN to 49 mL/hr x 1 hour and stop Electrolytes in TPN: Na 383m/L, K 4516mL, Ca 0mE25m, Mg 3mEq85m Phos incr to 24 mmol/L 10/19. Max Cl Add standard MVI and trace elements to TPN Insulin regular 10 units to TPN (added 10/19) Continue vsSSI and CBG checks QID while on cycle TPN (0800, 1300, 1700, 2000) Monitor TPN labs on Mon/Thurs   Thank you for allowing pharmacy to be a part of this patient's care.  AdrieArdyth HarpsrmD Clinical Pharmacist

## 2021-12-25 NOTE — Progress Notes (Signed)
Progress Note    Tracy Estrada  TKZ:[id number] DOB: 21-Nov-1950  DOA: 12/11/2021 PCP: [name], NP      Brief Narrative:    Medical records reviewed and are as summarized below:  Tracy Estrada is a 71 y.o. female with past medical history significant for hyperlipidemia, recurrent UTIs, degenerative disc disease, fibromyalgia, anxiety, depression, diverticulitis with abscess s/p cutaneous drain in April 2023.  She presented with generalized weakness subjective fever and chills.  She also complained of nausea, vomiting and decreased appetite.   She was admitted in April and was managed for perforated diverticulitis and was managed nonoperatively and eventually had IR drain placed, and was discharged.    She was found to have acute on chronic sigmoid diverticulitis with perforation, persistent sigmoid colonic fistula.  Started on TPN, full liquid diet.  General surgery performed Hartman's procedure on 12/22/2021.       Assessment/Plan:   Principal Problem:   Sigmoid diverticulitis Active Problems:   Urinary tract infection   Bilateral hydronephrosis   Anxiety and depression   Hyperlipidemia   Nutrition Problem: Moderate Malnutrition Etiology: acute illness  Signs/Symptoms: energy intake < 75% for > 7 days, percent weight loss, mild fat depletion, mild muscle depletion (5% wt loss in 2 weeks) Percent weight loss: 5 %   Body mass index is 21.48 kg/m.   Acute on chronic sigmoid diverticulitis with perforation/colonic fistula:  Found to have acute on chronic sigmoid diverticulitis with perforation, persistent sigmoid colonic fistula.  She remains on TPN and full liquid diet.  Underwent drain exchange by radiology on 12/15/2021.  Now s/p Hartman's procedure on 12/22/2021.  Continue analgesics as needed for pain.  Continue IV Zosyn.  Follow-up with general surgeon.  Urinary tract infection/bilateral hydronephrosis: UA was positive for leukocytes, nitrates,  bacteria.  Patient was also having increased frequency.  CT showed mild bilateral hydronephrosis without signs of stricture or fistula.   urine culture showed Klebsiella which is pansensitive.   Already on Zosyn   Anxiety/ depression: On Wellbutrin   Hyperlipidemia: On pravastatin   Hyperglycemia: Does not have history of diabetes.  Hemoglobin A1c 5.4 however currently hyperglycemic TPN is likely causing hyperglycemia.  10 units of insulin has been added to TPN bag to control hyperglycemia.        Diet Order             Diet full liquid Room service appropriate? Yes; Fluid consistency: Thin  Diet effective now                            Consultants: General surgeon Interventional radiologist  Procedures: Hartman's procedure on 12/22/2021    Medications:    acetaminophen  1,000 mg Oral Q6H   alvimopan  12 mg Oral BID   buPROPion  300 mg Oral Daily   busPIRone  5 mg Oral BID   Chlorhexidine Gluconate Cloth  6 each Topical Daily   enoxaparin (LOVENOX) injection  40 mg Subcutaneous Q24H   insulin aspart  0-6 Units Subcutaneous 4 times per day   ketorolac  15 mg Intravenous Q8H   lidocaine  1 patch Transdermal Q24H   lip balm   Topical BID   pantoprazole (PROTONIX) IV  40 mg Intravenous QHS   sodium chloride flush  10-40 mL Intracatheter Q12H   sodium chloride flush  3 mL Intravenous Q12H   sodium chloride flush  5 mL Intracatheter Q8H   Continuous Infusions:  ondansetron (ZOFRAN) IV 8 mg (12/25/21 0321)   piperacillin-tazobactam (ZOSYN)  IV 3.375 g (28/41/32 4401)   TPN CYCLIC-ADULT (ION)       Anti-infectives (From admission, onward)    Start     Dose/Rate Route Frequency Ordered Stop   12/17/21 1400  piperacillin-tazobactam (ZOSYN) IVPB 3.375 g        3.375 g 12.5 mL/hr over 240 Minutes Intravenous Every 8 hours 12/17/21 1052 12/27/21 2359   12/12/21 1400  piperacillin-tazobactam (ZOSYN) IVPB 3.375 g        3.375 g 12.5 mL/hr over 240 Minutes  Intravenous Every 8 hours 12/12/21 0738 12/17/21 2011   12/12/21 0745  piperacillin-tazobactam (ZOSYN) IVPB 3.375 g        3.375 g 100 mL/hr over 30 Minutes Intravenous  Once 12/12/21 0740 12/12/21 0922   12/12/21 0300  cefTRIAXone (ROCEPHIN) 1 g in sodium chloride 0.9 % 100 mL IVPB        1 g 200 mL/hr over 30 Minutes Intravenous  Once 12/12/21 0252 12/12/21 0527              Family Communication/Anticipated D/C date and plan/Code Status   DVT prophylaxis: enoxaparin (LOVENOX) injection 40 mg Start: 12/23/21 0800     Code Status: Full Code  Family Communication: Plan discussed with her husband Disposition Plan: Plan to discharge home in 2 to 3 days   Status is: Inpatient Remains inpatient appropriate because: S/p Hartman's procedure, uncontrolled abdominal pain       Subjective:   Interval events noted.  She still complains of severe abdominal pain.  Objective:    Vitals:   12/24/21 1100 12/24/21 2024 12/25/21 0800 12/25/21 1349  BP: 124/64 126/61 130/68 127/74  Pulse: (!) 108  (!) 110 (!) 104  Resp: '20  17 16  '$ Temp: 98.3 F (36.8 C) 98.6 F (37 C) 98.9 F (37.2 C) 98.9 F (37.2 C)  TempSrc: Oral Oral Oral Oral  SpO2: 94%  (!) 86% 96%  Weight:      Height:       No data found.   Intake/Output Summary (Last 24 hours) at 12/25/2021 1605 Last data filed at 12/25/2021 1500 Gross per 24 hour  Intake 1645.71 ml  Output 1091 ml  Net 554.71 ml   Filed Weights   12/11/21 1614 12/17/21 0900  Weight: 49.9 kg 49.9 kg    Exam:  GEN: NAD SKIN: Warm and dry EYES: No pallor or icterus ENT: MMM CV: RRR PULM: CTA B ABD: soft, ND, mid abdominal tenderness, dressing on surgical wound is clean, dry and intact, +BS, + colostomy, right lower quadrant JP drain CNS: AAO x 3, non focal EXT: No edema or tenderness         Data Reviewed:   I have personally reviewed following labs and imaging studies:  Labs: Labs show the following:   Basic  Metabolic Panel: Recent Labs  Lab 12/19/21 0310 12/21/21 0424 12/23/21 0446 12/24/21 0449 12/25/21 0640  NA 138 138 136 137 141  K 3.7 4.0 4.0 3.9 3.7  CL 102 105 103 102 105  CO2 '28 28 27 29 29  '$ GLUCOSE 141* 149* 240* 196* 160*  BUN 19 24* '21 20 18  '$ CREATININE 0.58 0.50 0.51 0.47 0.48  CALCIUM 9.7 9.5 9.2 9.0 9.1  MG 2.0 2.1  --  1.9  --   PHOS 3.6 3.0  --  1.7* 3.0   GFR Estimated Creatinine Clearance: 45 mL/min (by C-G formula based on SCr  of 0.48 mg/dL). Liver Function Tests: Recent Labs  Lab 12/21/21 0424 12/24/21 0449  AST 17 26  ALT 16 36  ALKPHOS 35* 41  BILITOT 0.3 0.3  PROT 6.0* 5.5*  ALBUMIN 2.9* 2.5*   No results for input(s): "LIPASE", "AMYLASE" in the last 168 hours. No results for input(s): "AMMONIA" in the last 168 hours. Coagulation profile No results for input(s): "INR", "PROTIME" in the last 168 hours.  CBC: Recent Labs  Lab 12/23/21 1345 12/25/21 0640  WBC 10.5 5.9  HGB 9.7* 8.2*  HCT 28.5* 24.6*  MCV 100.0 99.6  PLT 219 198   Cardiac Enzymes: No results for input(s): "CKTOTAL", "CKMB", "CKMBINDEX", "TROPONINI" in the last 168 hours. BNP (last 3 results) No results for input(s): "PROBNP" in the last 8760 hours. CBG: Recent Labs  Lab 12/24/21 1138 12/24/21 1618 12/24/21 2020 12/25/21 0756 12/25/21 1145  GLUCAP 189* 72 161* 151* 143*   D-Dimer: No results for input(s): "DDIMER" in the last 72 hours. Hgb A1c: No results for input(s): "HGBA1C" in the last 72 hours. Lipid Profile: Recent Labs    12/24/21 0449  TRIG 68   Thyroid function studies: No results for input(s): "TSH", "T4TOTAL", "T3FREE", "THYROIDAB" in the last 72 hours.  Invalid input(s): "FREET3" Anemia work up: No results for input(s): "VITAMINB12", "FOLATE", "FERRITIN", "TIBC", "IRON", "RETICCTPCT" in the last 72 hours. Sepsis Labs: Recent Labs  Lab 12/23/21 1345 12/25/21 0640  WBC 10.5 5.9    Microbiology Recent Results (from the past 240 hour(s))   Surgical pcr screen     Status: None   Collection Time: 12/22/21  5:54 AM   Specimen: Nasal Mucosa; Nasal Swab  Result Value Ref Range Status   MRSA, PCR NEGATIVE NEGATIVE Final   Staphylococcus aureus NEGATIVE NEGATIVE Final    Comment: (NOTE) The Xpert SA Assay (FDA approved for NASAL specimens in patients 64 years of age and older), is one component of a comprehensive surveillance program. It is not intended to diagnose infection nor to guide or monitor treatment. Performed at [facility], [place] [address] [place], [place] [address]     Procedures and diagnostic studies:  No results found.             LOS: 13 days   [place] Copywriter, advertising on [email or web address]. If 7PM-7AM, please contact night-coverage at [email or web address]     12/25/2021, 4:05 PM

## 2021-12-25 NOTE — Care Management Important Message (Signed)
Important Message  Patient Details  Name: Tracy Estrada MRN: [id number] Date of Birth: Aug 02, 1950   Medicare Important Message Given:  Yes     Hannah Beat 12/25/2021, 12:08 PM

## 2021-12-25 NOTE — Progress Notes (Signed)
Physical Therapy Treatment Patient Details Name: Tracy Estrada MRN: [id number] DOB: [birth year] Today's Date: 12/25/2021   History of Present Illness 71 y/o female presented to ED on 12/11/21 for abdominal pain, lack of appetite, and weight loss. Admitted for acute on chronic complicated sigmoid diverticulitis. S/p sigmoid colectomy and colostomy on 10/17. PMH: recurrent UTIs, diverticulitis, colostomy, anxiety/depression, fibromyalgia    PT Comments    Patient is making significant progress since last session in cognition and mobility. Patient able to ambulate to/from bathroom with min guard and Rw. Cues for upright posture as patient tends to keep trunk flexed due to surgery site, new colostomy, and drain. Encouraged continued mobility and asking nursing staff to walk into bathroom to void. D/c plan remains appropriate.     Recommendations for follow up therapy are one component of a multi-disciplinary discharge planning process, led by the attending physician.  Recommendations may be updated based on patient status, additional functional criteria and insurance authorization.  Follow Up Recommendations  Home health PT     Assistance Recommended at Discharge Frequent or constant Supervision/Assistance  Patient can return home with the following A little help with walking and/or transfers;A little help with bathing/dressing/bathroom;Assistance with cooking/housework;Assist for transportation;Help with stairs or ramp for entrance;Direct supervision/assist for medications management;Direct supervision/assist for financial management   Equipment Recommendations  Rolling Dezeray Puccio (2 wheels)    Recommendations for Other Services       Precautions / Restrictions Precautions Precautions: Fall Precaution Comments: colostomy, drain on R side Restrictions Weight Bearing Restrictions: No     Mobility  Bed Mobility Overal bed mobility: Needs Assistance Bed Mobility: Rolling, Sidelying to  Sit Rolling: Min assist Sidelying to sit: Min assist       General bed mobility comments: assist for rolling towards R side and trunk elevation. Cues for hand placement    Transfers Overall transfer level: Needs assistance Equipment used: Rolling Paislynn Hegstrom (2 wheels) Transfers: Sit to/from Stand Sit to Stand: Min guard           General transfer comment: min guard for safety. Cues for hand placement    Ambulation/Gait Ambulation/Gait assistance: Min guard Gait Distance (Feet): 12 Feet (+12') Assistive device: Rolling Ikenna Ohms (2 wheels) Gait Pattern/deviations: Step-through pattern, Decreased stride length, Trunk flexed Gait velocity: decreased     General Gait Details: trunk flexed throughout due to pain. Cues for upright posture. Min guard for Barrister's clerk    Modified Rankin (Stroke Patients Only)       Balance Overall balance assessment: Needs assistance Sitting-balance support: Single extremity supported, Feet supported Sitting balance-Leahy Scale: Fair     Standing balance support: Bilateral upper extremity supported, Reliant on assistive device for balance Standing balance-Leahy Scale: Poor                              Cognition Arousal/Alertness: Awake/alert Behavior During Therapy: WFL for tasks assessed/performed Overall Cognitive Status: Impaired/Different from baseline Area of Impairment: Memory, Awareness, Problem solving                     Memory: Decreased short-term memory     Awareness: Emergent Problem Solving: Decreased initiation, Requires verbal cues General Comments: improved cognition compared to previous session but still struggling with memory deficits        Exercises      General Comments  Pertinent Vitals/Pain Pain Assessment Pain Assessment: Faces Faces Pain Scale: Hurts little more Pain Location: abdomen with movement Pain Descriptors /  Indicators: Discomfort, Burning Pain Intervention(s): Monitored during session    Home Living                          Prior Function            PT Goals (current goals can now be found in the care plan section) Acute Rehab PT Goals PT Goal Formulation: With patient/family Time For Goal Achievement: 01/07/22 Potential to Achieve Goals: Good Progress towards PT goals: Progressing toward goals    Frequency    Min 3X/week      PT Plan Current plan remains appropriate    Co-evaluation              AM-PAC PT "6 Clicks" Mobility   Outcome Measure  Help needed turning from your back to your side while in a flat bed without using bedrails?: A Little Help needed moving from lying on your back to sitting on the side of a flat bed without using bedrails?: A Little Help needed moving to and from a bed to a chair (including a wheelchair)?: A Little Help needed standing up from a chair using your arms (e.g., wheelchair or bedside chair)?: A Little Help needed to walk in hospital room?: A Little Help needed climbing 3-5 steps with a railing? : A Lot 6 Click Score: 17    End of Session   Activity Tolerance: Patient tolerated treatment well Patient left: in chair;with call bell/phone within reach;with family/visitor present Nurse Communication: Mobility status PT Visit Diagnosis: Unsteadiness on feet (R26.81);Muscle weakness (generalized) (M62.81);Other abnormalities of gait and mobility (R26.89)     Time: 6270-3500 PT Time Calculation (min) (ACUTE ONLY): 27 min  Charges:  $Therapeutic Activity: 23-37 mins                     Chudney Scheffler A. Gilford Rile PT, DPT Acute Rehabilitation Services Office [phone]    Tracy Estrada 12/25/2021, 1:24 PM

## 2021-12-26 DIAGNOSIS — K5732 Diverticulitis of large intestine without perforation or abscess without bleeding: Secondary | ICD-10-CM | POA: Diagnosis not present

## 2021-12-26 LAB — CBC WITH DIFFERENTIAL/PLATELET
Abs Immature Granulocytes: 0.06 10*3/uL (ref 0.00–0.07)
Basophils Absolute: 0 10*3/uL (ref 0.0–0.1)
Basophils Relative: 0 %
Eosinophils Absolute: 0.1 10*3/uL (ref 0.0–0.5)
Eosinophils Relative: 3 %
HCT: 23.4 % — ABNORMAL LOW (ref 36.0–46.0)
Hemoglobin: 7.7 g/dL — ABNORMAL LOW (ref 12.0–15.0)
Immature Granulocytes: 1 %
Lymphocytes Relative: 25 %
Lymphs Abs: 1.1 10*3/uL (ref 0.7–4.0)
MCH: 33.3 pg (ref 26.0–34.0)
MCHC: 32.9 g/dL (ref 30.0–36.0)
MCV: 101.3 fL — ABNORMAL HIGH (ref 80.0–100.0)
Monocytes Absolute: 0.3 10*3/uL (ref 0.1–1.0)
Monocytes Relative: 8 %
Neutro Abs: 2.7 10*3/uL (ref 1.7–7.7)
Neutrophils Relative %: 63 %
Platelets: 197 10*3/uL (ref 150–400)
RBC: 2.31 MIL/uL — ABNORMAL LOW (ref 3.87–5.11)
RDW: 13.1 % (ref 11.5–15.5)
WBC: 4.3 10*3/uL (ref 4.0–10.5)
nRBC: 0 % (ref 0.0–0.2)

## 2021-12-26 LAB — GLUCOSE, CAPILLARY
Glucose-Capillary: 135 mg/dL — ABNORMAL HIGH (ref 70–99)
Glucose-Capillary: 108 mg/dL — ABNORMAL HIGH (ref 70–99)
Glucose-Capillary: 88 mg/dL (ref 70–99)
Glucose-Capillary: 130 mg/dL — ABNORMAL HIGH (ref 70–99)

## 2021-12-26 MED ORDER — TRACE MINERALS CU-MN-SE-ZN 300-55-60-3000 MCG/ML IV SOLN
INTRAVENOUS | Status: AC
  Filled 2021-12-26: qty 627.2

## 2021-12-26 NOTE — Progress Notes (Signed)
4 Days Post-Op   Subjective/Chief Complaint: Patient doing well.  Pain well controlled.  Ostomy functioning.  No nausea or vomiting.   Objective: Vital signs in last 24 hours: Temp:  [98.2 F (36.8 C)-98.9 F (37.2 C)] 98.2 F (36.8 C) (10/21 0800) Pulse Rate:  [104] 104 (10/20 1349) Resp:  [16-20] 20 (10/21 0800) BP: (120-141)/(65-84) 120/77 (10/21 0800) SpO2:  [96 %-97 %] 97 % (10/20 2216) Last BM Date : 12/21/21  Intake/Output from previous day: 10/20 0701 - 10/21 0700 In: 2415.1 [I.V.:2145.9; IV Piggyback:269.2] Out: 36 [Urine:1; Drains:35] Intake/Output this shift: No intake/output data recorded.   Gen:  Alert, NAD, pleasant pale appearing Card:  RRR Pulm:  CTAB, no W/R/R, rate and effort normal Abd: Soft, appropriately tender, RLQ drain SS, midline wound w/ dressing c/d/I. LLQ ostomy pink and viable, stool and air in bag. Msk: no lower extremity edema. RUE PICC Lab Results:  Recent Labs    12/25/21 0640 12/26/21 0305  WBC 5.9 4.3  HGB 8.2* 7.7*  HCT 24.6* 23.4*  PLT 198 197   BMET Recent Labs    12/24/21 0449 12/25/21 0640  NA 137 141  K 3.9 3.7  CL 102 105  CO2 29 29  GLUCOSE 196* 160*  BUN 20 18  CREATININE 0.47 0.48  CALCIUM 9.0 9.1   PT/INR No results for input(s): "LABPROT", "INR" in the last 72 hours. ABG No results for input(s): "PHART", "HCO3" in the last 72 hours.  Invalid input(s): "PCO2", "PO2"  Studies/Results: No results found.  Anti-infectives: Anti-infectives (From admission, onward)    Start     Dose/Rate Route Frequency Ordered Stop   12/17/21 1400  piperacillin-tazobactam (ZOSYN) IVPB 3.375 g        3.375 g 12.5 mL/hr over 240 Minutes Intravenous Every 8 hours 12/17/21 1052 12/27/21 2359   12/12/21 1400  piperacillin-tazobactam (ZOSYN) IVPB 3.375 g        3.375 g 12.5 mL/hr over 240 Minutes Intravenous Every 8 hours 12/12/21 0738 12/17/21 2011   12/12/21 0745  piperacillin-tazobactam (ZOSYN) IVPB 3.375 g        3.375  g 100 mL/hr over 30 Minutes Intravenous  Once 12/12/21 0740 12/12/21 0922   12/12/21 0300  cefTRIAXone (ROCEPHIN) 1 g in sodium chloride 0.9 % 100 mL IVPB        1 g 200 mL/hr over 30 Minutes Intravenous  Once 12/12/21 0252 12/12/21 0527       Assessment/Plan: Acute on chronic sigmoid diverticulitis with perforation, colonic fistula with IR drain - Afebrile, mild tachycardia, hemoglobin 7.7 which is prior combination of acute on chronic anemia secondary to surgical stress, bone marrow depression due to poor nutrition and IV fluids.  -Awake and alert today.  Very pleasant.  Confusion seems to be improved overall.  Acute on chronic anemia secondary to acute blood loss complicated by poor nutrition and chronic illness-monitor CBC.  - continue blake drain - BID moist-to-dry dressing changes - WOC RN for new ostomy education - mobilize and IS   Pain: scheduled tylenol, toradol 15 mg q 8h, low dose oxycodone 2.5-5.0 mg q4h PRN, lidoderm patch, 0.5 mg dilaudid q 2h PRN.  FEN: FLD, protein shakes ID: Zosyn >> plan to continue for 5 days post-op. Dispo: Continue IV abx, advance to soft diet, continue TPN for 24 to 48 hours until p.o. intake improves   LOS: 14 days    Turner Daniels MD 12/26/2021

## 2021-12-26 NOTE — Progress Notes (Signed)
PROGRESS NOTE  Tracy Estrada  DOB: 1950-11-03  PCP: [name], NP ALP:[id number]  DOA: 12/11/2021  LOS: 14 days  Hospital Day: 16  Brief narrative: Tracy Estrada is a 71 y.o. female with PMH significant for hyperlipidemia, recurrent UTIs, degenerative disc disease, fibromyalgia, anxiety/depression, diverticulitis with abscess s/p cutaneous drain in April 2023 since when she has a chronic left lower quadrant drain in place. 10/6, patient presented to the ED with generalized weakness subjective fever and chills, nausea, vomiting, poor oral intake Work-up showed acute on chronic sigmoid diverticulitis with perforation, persistent sigmoid colonic fistula.   See below for details.  Subjective: Patient was seen and examined this morning.  Pleasant elderly Caucasian female.  Propped up in bed.  Not in distress.  Family at bedside. Loose brown stool in ostomy bag.  Bloody drainage in JP drain in the left lower quadrant. Labs from this morning with hemoglobin down to 7.7.  Assessment and plan: Acute on chronic sigmoid diverticulitis with perforation/colonic fistula s/p prior LLQ drain placement by IR - 06/2021 Initially started on conservative management with antibiotics. 10/10, underwent catheter exchange and revision by IR. 10/17, underwent Hartman's procedure by general surgery. Improving postop ileus. Remains on TPN and full liquid diet. Surgery following Drain with serosanguineous drainage Continue IV Zosyn Pain management per general surgery Encourage ambulation Recent Labs  Lab 12/23/21 1345 12/25/21 0640 12/26/21 0305  WBC 10.5 5.9 4.3   Urinary tract infection/bilateral hydronephrosis UA on admission was positive for leukocytes, nitrates, bacteria.   Patient was also having increased frequency.   CT showed mild bilateral hydronephrosis without signs of stricture or fistula.   Urine culture showed Klebsiella which is pansensitive. Already on Zosyn  Acute  anemia Baseline hemoglobin over 11.  Since admission, hemoglobin is sliding down gradually.  No acute blood loss.   Hemoglobin this morning low at 7.7.  Secondary to blood loss, dilutional effect from IV hydration as well as possible bone marrow suppression from poor nutrition Obtain anemia panel, type and screen Recent Labs    06/21/21 0845 06/22/21 0153 12/13/21 0231 12/15/21 0239 12/23/21 1345 12/25/21 0640 12/26/21 0305  HGB  --    < > 11.9* 11.0* 9.7* 8.2* 7.7*  MCV  --    < > 96.3 99.1 100.0 99.6 101.3*  VITAMINB12 562  --   --   --   --   --   --   FOLATE 16.1  --   --   --   --   --   --   FERRITIN 477*  --   --   --   --   --   --   TIBC 209*  --   --   --   --   --   --   IRON 12*  --   --   --   --   --   --   RETICCTPCT 1.0  --   --   --   --   --   --    < > = values in this interval not displayed.   Anxiety/ depression On Wellbutrin   Hyperlipidemia On pravastatin   Hyperglycemia Does not have history of diabetes.  Hemoglobin A1c 5.4 however currently hyperglycemic likely because of TPN Currently on sliding scale insulin with Accu-Cheks Recent Labs  Lab 12/25/21 0756 12/25/21 1145 12/25/21 1649 12/25/21 2007 12/26/21 0938  GLUCAP 151* 143* 92 124* 135*    Goals of care   Code Status: Full  Code   Mobility: Encourage ambulation  Skin assessment:     Nutritional status:  Body mass index is 21.48 kg/m.  Nutrition Problem: Moderate Malnutrition Etiology: acute illness Signs/Symptoms: energy intake < 75% for > 7 days, percent weight loss, mild fat depletion, mild muscle depletion (5% wt loss in 2 weeks) Percent weight loss: 5 %     Diet:  Diet Order             DIET SOFT Room service appropriate? Yes; Fluid consistency: Thin  Diet effective now                   DVT prophylaxis:  enoxaparin (LOVENOX) injection 40 mg Start: 12/23/21 0800   Antimicrobials: IV Zosyn Fluid: TPN Consultants: General surgery, IR Family Communication:  Family at bedside  Status is: Inpatient  Continue in-hospital care because: Needs IV antibiotics, improving postop ileus Level of care: Med-Surg   Dispo: The patient is from: Home              Anticipated d/c is to: Home hopefully in next few days              Patient currently is not medically stable to d/c.   Difficult to place patient No     Infusions:   ondansetron [facility]) IV 8 mg (12/25/21 2146)   piperacillin-tazobactam (ZOSYN)  IV 3.375 g (41/96/22 2979)   TPN CYCLIC-ADULT (ION) 49 mL/hr at 12/26/21 1007    Scheduled Meds:  acetaminophen  1,000 mg Oral Q6H   alvimopan  12 mg Oral BID   buPROPion  300 mg Oral Daily   busPIRone  5 mg Oral BID   Chlorhexidine Gluconate Cloth  6 each Topical Daily   enoxaparin (LOVENOX) injection  40 mg Subcutaneous Q24H   insulin aspart  0-6 Units Subcutaneous 4 times per day   ketorolac  15 mg Intravenous Q8H   lidocaine  1 patch Transdermal Q24H   lip balm   Topical BID   pantoprazole (PROTONIX) IV  40 mg Intravenous QHS   sodium chloride flush  10-40 mL Intracatheter Q12H   sodium chloride flush  3 mL Intravenous Q12H   sodium chloride flush  5 mL Intracatheter Q8H    PRN meds: albuterol, hydrALAZINE, HYDROmorphone (DILAUDID) injection, lactose free nutrition, loperamide, magic mouthwash, [DISCONTINUED] ondansetron (ZOFRAN) IV **OR** ondansetron (ZOFRAN) IV, mouth rinse, oxyCODONE, prochlorperazine, sodium chloride, sodium chloride flush   Antimicrobials: Anti-infectives (From admission, onward)    Start     Dose/Rate Route Frequency Ordered Stop   12/17/21 1400  piperacillin-tazobactam (ZOSYN) IVPB 3.375 g        3.375 g 12.5 mL/hr over 240 Minutes Intravenous Every 8 hours 12/17/21 1052 12/27/21 2359   12/12/21 1400  piperacillin-tazobactam (ZOSYN) IVPB 3.375 g        3.375 g 12.5 mL/hr over 240 Minutes Intravenous Every 8 hours 12/12/21 0738 12/17/21 2011   12/12/21 0745  piperacillin-tazobactam (ZOSYN) IVPB 3.375 g         3.375 g 100 mL/hr over 30 Minutes Intravenous  Once 12/12/21 0740 12/12/21 0922   12/12/21 0300  cefTRIAXone (ROCEPHIN) 1 g in sodium chloride 0.9 % 100 mL IVPB        1 g 200 mL/hr over 30 Minutes Intravenous  Once 12/12/21 0252 12/12/21 0527       Objective: Vitals:   12/25/21 2216 12/26/21 0800  BP: 128/84 120/77  Pulse:    Resp:  20  Temp: 98.6 F (37  C) 98.2 F (36.8 C)  SpO2: 97%     Intake/Output Summary (Last 24 hours) at 12/26/2021 1030 Last data filed at 12/25/2021 2007 Gross per 24 hour  Intake 2415.08 ml  Output 36 ml  Net 2379.08 ml   Filed Weights   12/11/21 1614 12/17/21 0900  Weight: 49.9 kg 49.9 kg   Weight change:  Body mass index is 21.48 kg/m.   Physical Exam: General exam: Pleasant, elderly Caucasian female. Skin: No rashes, lesions or ulcers. HEENT: Atraumatic, normocephalic, no obvious bleeding Lungs: Clear to auscultation bilaterally CVS: Regular rate and rhythm, no murmur GI/Abd soft, mild tenderness around drain site. JP drain in left lower quadrant.  Ostomy bag with liquid stool CNS: Alert, awake, oriented x3 Psychiatry: Mood appropriate Extremities: No pedal edema, no calf tenderness  Data Review: I have personally reviewed the laboratory data and studies available.  F/u labs ordered Unresulted Labs (From admission, onward)     Start     Ordered   12/27/21 0500  CBC  Tomorrow morning,   R       Question:  Specimen collection method  Answer:  IV Team=IV Team collect   12/26/21 1022   12/27/21 0500  Vitamin B12  (Anemia Panel (PNL))  Tomorrow morning,   R       Question:  Specimen collection method  Answer:  IV Team=IV Team collect   12/26/21 1028   12/27/21 0500  Folate  (Anemia Panel (PNL))  Tomorrow morning,   R       Question:  Specimen collection method  Answer:  IV Team=IV Team collect   12/26/21 1028   12/27/21 0500  Iron and TIBC  (Anemia Panel (PNL))  Tomorrow morning,   R       Question:  Specimen collection method   Answer:  IV Team=IV Team collect   12/26/21 1028   12/27/21 0500  Ferritin  (Anemia Panel (PNL))  Tomorrow morning,   R       Question:  Specimen collection method  Answer:  IV Team=IV Team collect   12/26/21 1028   12/27/21 0500  Reticulocytes  (Anemia Panel (PNL))  Tomorrow morning,   R       Question:  Specimen collection method  Answer:  IV Team=IV Team collect   12/26/21 1028   12/27/21 0500  Type and screen [place]  Once,   R       Comments: [place]    12/26/21 1030   12/17/21 0500  Triglycerides  (TPN Lab Panel)  Every Mon,Thu (0500),   R     Question:  Specimen collection method  Answer:  IV Team=IV Team collect   12/16/21 1048   12/14/21 0500  Comprehensive metabolic panel  (TPN Lab Panel)  Every Mon,Thu (0500),   R      12/13/21 0956   12/14/21 0500  Magnesium  (TPN Lab Panel)  Every Mon,Thu (0500),   R      12/13/21 0956   12/14/21 0500  Phosphorus  (TPN Lab Panel)  Every Mon,Thu (0500),   R      12/13/21 0956   12/13/21 0000  Prealbumin  Weekly,   R      12/12/21 0734            Signed, Terrilee Croak, MD [facility] 12/26/2021

## 2021-12-26 NOTE — Progress Notes (Signed)
PHARMACY - TOTAL PARENTERAL NUTRITION CONSULT NOTE   Indication: complicated diverticulitis  Patient Measurements: Height: 5' (152.4 cm) Weight: 49.9 kg (110 lb 0.2 oz) IBW/kg (Calculated) : 45.5 TPN AdjBW (KG): 49.9 Body mass index is 21.48 kg/m. Usual Weight: 45 kg  Assessment:  71 yo F admitted on 10/7 who presented with weakness and 8lb weight loss over 2 weeks. Patient been eating "poorly" ~ 1 month at home with limited toleration d/t diverticulitis. (Has cutaneous drain in place since April 2023 for diverticulitis w/ abscess). Pharmacy has been consulted for TPN management to improve nutritional status in order to help stimulate positive post-op recovery.   Patient switched Boost supplements to Premier Protein supplements (brought in by family per patient preference on flavor options) and notes nausea/vomiting improved. She has also been able to tolerate sherbet ice cream. Encouraged to try FLD menu options for soups as tolerated.   Patient underwent sigmoid colectomy and colostomy 10/17. Plan to continue TPN 24-48h until PO intake improves per surgery.   Glucose / Insulin: A1c 5.4%; CBG 92-151, CBG checks 4 times daily, vsSSI 1 unit/24hrs, 10 units regular insulin in TPN Electrolytes: 10/20: K 3.7, Phos 3.0, others wnl and stable Renal: 10/20: Scr <1 stable, BUN slightly up (24) Hepatic: 10/19: Alb 2.9, LFTs WNL, 10/15: prealbumin 15, TG 103 Intake / Output; MIVF: UOP: 1000 mL charted, noted patient has gas in ostomy bag, 40 mL drain output - still reporting nausea  GI Imaging: 10/07 CTAP colon diverticulosis; no recurrent abscess; complicated acute on chronic sigmoid diverticulitis with progression since June GI Surgeries / Procedures:  10/10 LLQ abscess drain catheter exchange in IR Planning for GI operation- Hartman's procedure this week, pending repeat pre-albumin level (now up to 15) 10/17 sigmoid colectomy and colostomy (Hartman's procedure)  Central access: PICC  (double-lumen) placed 10/08 TPN start date: 10/08  Nutritional Goals: Goal TPN rate is 70 mL/hr (provides 94 g of protein and 1792 kcals per day)  RD Assessment: Estimated Needs Total Energy Estimated Needs: 1600-1800 Total Protein Estimated Needs: 80-95 grams Total Fluid Estimated Needs: >/= 1.6 L  Current Nutrition:  TPN + FLD Boost Breeze TID (none given per charting) > DC per pt preference >> Premier High Protein (30g/bottle) - family brings in  Plan:  Continue to cycle TPN over 18 hours (GIR 2.37-4.27; providing 94g protein, meeting 100% of patients needs) - At 1800, begin TPN at 49 mL/hr for 1 hour, then increase to 99 mL/hr x 16 hours, then at 1100 on 10/17 reduce TPN to 49 mL/hr x 1 hour and stop Electrolytes in TPN: Na 72mq/L, K 475m/L, Ca 64m86mL, Mg 3mE77m, Phos 24 mmol/L. Max Cl Add standard MVI and trace elements to TPN Insulin regular 10 units in TPN (added 10/19) Continue vsSSI and CBG checks QID while on cycle TPN (0800, 1300, 1700, 2000) Monitor TPN labs on Mon/Thurs  Thank you for allowing pharmacy to be a part of this patient's care.  MaryDimple NanasarmD, BCPS 12/26/2021 7:27 AM

## 2021-12-26 NOTE — Plan of Care (Signed)

## 2021-12-27 DIAGNOSIS — K5732 Diverticulitis of large intestine without perforation or abscess without bleeding: Secondary | ICD-10-CM | POA: Diagnosis not present

## 2021-12-27 LAB — CBC
HCT: 25.6 % — ABNORMAL LOW (ref 36.0–46.0)
Hemoglobin: 8.1 g/dL — ABNORMAL LOW (ref 12.0–15.0)
MCH: 32.7 pg (ref 26.0–34.0)
MCHC: 31.6 g/dL (ref 30.0–36.0)
MCV: 103.2 fL — ABNORMAL HIGH (ref 80.0–100.0)
Platelets: 245 10*3/uL (ref 150–400)
RBC: 2.48 MIL/uL — ABNORMAL LOW (ref 3.87–5.11)
RDW: 13.2 % (ref 11.5–15.5)
WBC: 3.9 10*3/uL — ABNORMAL LOW (ref 4.0–10.5)
nRBC: 0 % (ref 0.0–0.2)

## 2021-12-27 LAB — PREALBUMIN: Prealbumin: 16 mg/dL — ABNORMAL LOW (ref 18–38)

## 2021-12-27 LAB — GLUCOSE, CAPILLARY
Glucose-Capillary: 137 mg/dL — ABNORMAL HIGH (ref 70–99)
Glucose-Capillary: 111 mg/dL — ABNORMAL HIGH (ref 70–99)
Glucose-Capillary: 83 mg/dL (ref 70–99)
Glucose-Capillary: 115 mg/dL — ABNORMAL HIGH (ref 70–99)

## 2021-12-27 LAB — VITAMIN B12: Vitamin B-12: 756 pg/mL (ref 180–914)

## 2021-12-27 LAB — FOLATE: Folate: 16.2 ng/mL (ref >5.9)

## 2021-12-27 LAB — IRON AND TIBC
Iron: 88 ug/dL (ref 28–170)
Saturation Ratios: 38 % — ABNORMAL HIGH (ref 10.4–31.8)
TIBC: 234 ug/dL — ABNORMAL LOW (ref 250–450)
UIBC: 146 ug/dL

## 2021-12-27 LAB — RETICULOCYTES
Immature Retic Fract: 24.8 % — ABNORMAL HIGH (ref 2.3–15.9)
RBC.: 2.44 MIL/uL — ABNORMAL LOW (ref 3.87–5.11)
Retic Count, Absolute: 83 10*3/uL (ref 19.0–186.0)
Retic Ct Pct: 3.4 % — ABNORMAL HIGH (ref 0.4–3.1)

## 2021-12-27 LAB — TYPE AND SCREEN
ABO/RH(D): B POS
Antibody Screen: NEGATIVE

## 2021-12-27 LAB — FERRITIN: Ferritin: 251 ng/mL (ref 11–307)

## 2021-12-27 MED ORDER — INSULIN ASPART 100 UNIT/ML IJ SOLN: 0.0000 [IU] | Freq: Three times a day (TID) | INTRAMUSCULAR | Status: DC

## 2021-12-27 MED ORDER — TRACE MINERALS CU-MN-SE-ZN 300-55-60-3000 MCG/ML IV SOLN
INTRAVENOUS | Status: AC
  Filled 2021-12-27: qty 313.6

## 2021-12-27 MED ORDER — INSULIN ASPART 100 UNIT/ML IJ SOLN: 0.0000 [IU] | Freq: Every day | INTRAMUSCULAR | Status: DC

## 2021-12-27 NOTE — Plan of Care (Signed)

## 2021-12-27 NOTE — Progress Notes (Signed)
5 Days Post-Op   Subjective/Chief Complaint: Patient feels well.  She is tolerating her diet and her ostomy is working.  She is not dizzy.   Objective: Vital signs in last 24 hours: Temp:  [97.6 F (36.4 C)-98.7 F (37.1 C)] 98.7 F (37.1 C) (10/22 0724) Pulse Rate:  [95-96] 96 (10/22 0724) Resp:  [14-17] 17 (10/22 0724) BP: (125-130)/(66-87) 129/70 (10/22 0724) SpO2:  [94 %-98 %] 94 % (10/22 0724) Last BM Date : 12/21/21  Intake/Output from previous day: 10/21 0701 - 10/22 0700 In: 536.5 [P.O.:360; IV Piggyback:176.5] Out: 185 [Drains:35; Stool:150] Intake/Output this shift: No intake/output data recorded.   Gen:  Alert, NAD, pleasant pale appearing Card:  RRR Pulm:  CTAB, no W/R/R, rate and effort normal Abd: Soft, appropriately tender, RLQ drain SS, midline wound w/ dressing c/d/I. LLQ ostomy pink and viable, stool and air in bag. Msk: no lower extremity edema. RUE PICC Lab Results:  Recent Labs    12/26/21 0305 12/27/21 0447  WBC 4.3 3.9*  HGB 7.7* 8.1*  HCT 23.4* 25.6*  PLT 197 245   BMET Recent Labs    12/25/21 0640  NA 141  K 3.7  CL 105  CO2 29  GLUCOSE 160*  BUN 18  CREATININE 0.48  CALCIUM 9.1   PT/INR No results for input(s): "LABPROT", "INR" in the last 72 hours. ABG No results for input(s): "PHART", "HCO3" in the last 72 hours.  Invalid input(s): "PCO2", "PO2"  Studies/Results: No results found.  Anti-infectives: Anti-infectives (From admission, onward)    Start     Dose/Rate Route Frequency Ordered Stop   12/17/21 1400  piperacillin-tazobactam (ZOSYN) IVPB 3.375 g        3.375 g 12.5 mL/hr over 240 Minutes Intravenous Every 8 hours 12/17/21 1052 12/27/21 2359   12/12/21 1400  piperacillin-tazobactam (ZOSYN) IVPB 3.375 g        3.375 g 12.5 mL/hr over 240 Minutes Intravenous Every 8 hours 12/12/21 0738 12/17/21 2011   12/12/21 0745  piperacillin-tazobactam (ZOSYN) IVPB 3.375 g        3.375 g 100 mL/hr over 30 Minutes Intravenous   Once 12/12/21 0740 12/12/21 0922   12/12/21 0300  cefTRIAXone (ROCEPHIN) 1 g in sodium chloride 0.9 % 100 mL IVPB        1 g 200 mL/hr over 30 Minutes Intravenous  Once 12/12/21 0252 12/12/21 0527       Assessment/Plan:  Acute on chronic sigmoid diverticulitis with perforation, colonic fistula with IR drain - Afebrile, mild tachycardia, hemoglobin 8.1  which is prior combination of acute on chronic anemia secondary to surgical stress, bone marrow depression due to poor nutrition and IV fluids.   -Awake and alert today.  Very pleasant.  Confusion seems to be improved overall.   Acute on chronic anemia secondary to acute blood loss complicated by poor nutrition and chronic illness-monitor CBC.   - continue blake drain - BID moist-to-dry dressing changes - WOC RN for new ostomy education - mobilize and IS   Pain: scheduled tylenol, toradol 15 mg q 8h, low dose oxycodone 2.5-5.0 mg q4h PRN, lidoderm patch, 0.5 mg dilaudid q 2h PRN.   FEN: FLD, protein shakes ID: Zosyn >> plan to continue for 5 days post-op. Dispo: Continue IV abx, advance to soft diet, continue TPN for 24 to 48 hours until p.o. intake improves Plan to stop TNA tomorrow, advance diet today  LOS: 15 days    Turner Daniels MD 12/27/2021

## 2021-12-27 NOTE — Progress Notes (Signed)
PROGRESS NOTE  Tracy Estrada  DOB: 1950/10/10  PCP: [name], NP OIZ:[id number]  DOA: 12/11/2021  LOS: 15 days  Hospital Day: 17  Brief narrative: Tracy Estrada is a 71 y.o. female with PMH significant for hyperlipidemia, recurrent UTIs, degenerative disc disease, fibromyalgia, anxiety/depression, diverticulitis with abscess s/p cutaneous drain in April 2023 since when she has a chronic left lower quadrant drain in place. 10/6, patient presented to the ED with generalized weakness subjective fever and chills, nausea, vomiting, poor oral intake Work-up showed acute on chronic sigmoid diverticulitis with perforation, persistent sigmoid colonic fistula.   See below for details.  Subjective: Patient was seen and examined this morning.   Not in distress.  Feels better.  Labs from this morning with hemoglobin improved from 7.7-8.1.  Assessment and plan: Acute on chronic sigmoid diverticulitis with perforation/colonic fistula s/p prior LLQ drain placement by IR - 06/2021 Initially started on conservative management with antibiotics. 10/10, underwent catheter exchange and revision by IR. 10/17, underwent Hartman's procedure by general surgery. Improving postop ileus. Remains on TPN.  Noted soft diet started by general surgery today.   Surgery following Drain with serosanguineous drainage Continue IV Zosyn Pain management per general surgery Encourage ambulation Recent Labs  Lab 12/23/21 1345 12/25/21 0640 12/26/21 0305 12/27/21 0447  WBC 10.5 5.9 4.3 3.9*   Urinary tract infection/bilateral hydronephrosis UA on admission was positive for leukocytes, nitrates, bacteria.   Patient was also having increased frequency.   CT showed mild bilateral hydronephrosis without signs of stricture or fistula.   Urine culture showed Klebsiella which is pansensitive. Already treated with Zosyn  Acute anemia Baseline hemoglobin over 11.  Since admission, hemoglobin is sliding down  gradually.  No acute blood loss.   Hemoglobin was low low at 7.7 on 10/21.  He spontaneously improved to 8.1 today.  No active blood loss.  Anemia panel this morning did not show any deficiency of iron, vitamin B12 or folate. Recent Labs    06/21/21 0845 06/22/21 0153 12/15/21 0239 12/23/21 1345 12/25/21 0640 12/26/21 0305 12/27/21 0447  HGB  --    < > 11.0* 9.7* 8.2* 7.7* 8.1*  MCV  --    < > 99.1 100.0 99.6 101.3* 103.2*  VITAMINB12 562  --   --   --   --   --  756  FOLATE 16.1  --   --   --   --   --  16.2  FERRITIN 477*  --   --   --   --   --  251  TIBC 209*  --   --   --   --   --  234*  IRON 12*  --   --   --   --   --  88  RETICCTPCT 1.0  --   --   --   --   --  3.4*   < > = values in this interval not displayed.   Anxiety/ depression On Wellbutrin   Hyperlipidemia On pravastatin   Hyperglycemia Does not have history of diabetes.  Hemoglobin A1c 5.4 however currently hyperglycemic likely because of TPN Currently on sliding scale insulin with Accu-Cheks Recent Labs  Lab 12/26/21 1228 12/26/21 1621 12/26/21 2006 12/27/21 0729 12/27/21 1116  GLUCAP 108* 88 130* 137* 111*    Goals of care   Code Status: Full Code   Mobility: Encourage ambulation  Skin assessment:     Nutritional status:  Body mass index is 21.48 kg/m.  Nutrition Problem: Moderate Malnutrition Etiology: acute illness Signs/Symptoms: energy intake < 75% for > 7 days, percent weight loss, mild fat depletion, mild muscle depletion (5% wt loss in 2 weeks) Percent weight loss: 5 %     Diet:  Diet Order             DIET SOFT Room service appropriate? Yes; Fluid consistency: Thin  Diet effective now                   DVT prophylaxis:  enoxaparin (LOVENOX) injection 40 mg Start: 12/23/21 0800   Antimicrobials: IV Zosyn Fluid: TPN Consultants: General surgery, IR Family Communication: Family at bedside  Status is: Inpatient  Continue in-hospital care because: Needs IV  antibiotics, improving postop ileus Level of care: Med-Surg   Dispo: The patient is from: Home              Anticipated d/c is to: Home hopefully in next few days after general surgery clearance              Patient currently is not medically stable to d/c.   Difficult to place patient No     Infusions:   ondansetron (ZOFRAN) IV 8 mg (12/26/21 1134)   piperacillin-tazobactam (ZOSYN)  IV 3.375 g (12/27/21 0524)   TPN ADULT (ION)     TPN CYCLIC-ADULT (ION) 49 mL/hr at 12/27/21 1050    Scheduled Meds:  acetaminophen  1,000 mg Oral Q6H   alvimopan  12 mg Oral BID   buPROPion  300 mg Oral Daily   busPIRone  5 mg Oral BID   Chlorhexidine Gluconate Cloth  6 each Topical Daily   enoxaparin (LOVENOX) injection  40 mg Subcutaneous Q24H   insulin aspart  0-5 Units Subcutaneous QHS   insulin aspart  0-6 Units Subcutaneous 4 times per day   insulin aspart  0-6 Units Subcutaneous TID WC   ketorolac  15 mg Intravenous Q8H   lidocaine  1 patch Transdermal Q24H   lip balm   Topical BID   pantoprazole (PROTONIX) IV  40 mg Intravenous QHS   sodium chloride flush  10-40 mL Intracatheter Q12H   sodium chloride flush  3 mL Intravenous Q12H   sodium chloride flush  5 mL Intracatheter Q8H    PRN meds: albuterol, hydrALAZINE, HYDROmorphone (DILAUDID) injection, lactose free nutrition, loperamide, magic mouthwash, [DISCONTINUED] ondansetron (ZOFRAN) IV **OR** ondansetron (ZOFRAN) IV, mouth rinse, oxyCODONE, prochlorperazine, sodium chloride, sodium chloride flush   Antimicrobials: Anti-infectives (From admission, onward)    Start     Dose/Rate Route Frequency Ordered Stop   12/17/21 1400  piperacillin-tazobactam (ZOSYN) IVPB 3.375 g        3.375 g 12.5 mL/hr over 240 Minutes Intravenous Every 8 hours 12/17/21 1052 12/27/21 2359   12/12/21 1400  piperacillin-tazobactam (ZOSYN) IVPB 3.375 g        3.375 g 12.5 mL/hr over 240 Minutes Intravenous Every 8 hours 12/12/21 0738 12/17/21 2011    12/12/21 0745  piperacillin-tazobactam (ZOSYN) IVPB 3.375 g        3.375 g 100 mL/hr over 30 Minutes Intravenous  Once 12/12/21 0740 12/12/21 0922   12/12/21 0300  cefTRIAXone (ROCEPHIN) 1 g in sodium chloride 0.9 % 100 mL IVPB        1 g 200 mL/hr over 30 Minutes Intravenous  Once 12/12/21 0252 12/12/21 0527       Objective: Vitals:   12/27/21 0329 12/27/21 0724  BP: 125/66 129/70  Pulse: 95 96  Resp:  14 17  Temp: 97.6 F (36.4 C) 98.7 F (37.1 C)  SpO2: 98% 94%    Intake/Output Summary (Last 24 hours) at 12/27/2021 1129 Last data filed at 12/27/2021 1050 Gross per 24 hour  Intake 301.45 ml  Output 245 ml  Net 56.45 ml   Filed Weights   12/11/21 1614 12/17/21 0900  Weight: 49.9 kg 49.9 kg   Weight change:  Body mass index is 21.48 kg/m.   Physical Exam: General exam: Pleasant, elderly Caucasian female. Skin: No rashes, lesions or ulcers. HEENT: Atraumatic, normocephalic, no obvious bleeding Lungs: Clear to auscultation bilaterally CVS: Regular rate and rhythm, no murmur GI/Abd soft, mild tenderness around drain site. JP drain in left lower quadrant.  Ostomy bag with liquid stool CNS: Alert, awake, oriented x3 Psychiatry: Mood appropriate Extremities: No pedal edema, no calf tenderness  Data Review: I have personally reviewed the laboratory data and studies available.  F/u labs ordered Unresulted Labs (From admission, onward)     Start     Ordered   12/17/21 0500  Triglycerides  (TPN Lab Panel)  Every Mon,Thu (0500),   R     Question:  Specimen collection method  Answer:  IV Team=IV Team collect   12/16/21 1048   12/14/21 0500  Comprehensive metabolic panel  (TPN Lab Panel)  Every Mon,Thu (0500),   R      12/13/21 0956   12/14/21 0500  Magnesium  (TPN Lab Panel)  Every Mon,Thu (0500),   R      12/13/21 0956   12/14/21 0500  Phosphorus  (TPN Lab Panel)  Every Mon,Thu (0500),   R      12/13/21 0956   12/13/21 0000  Prealbumin  Weekly,   R      12/12/21  0734            Signed, Terrilee Croak, MD [facility] 12/27/2021

## 2021-12-27 NOTE — Progress Notes (Signed)
PHARMACY - TOTAL PARENTERAL NUTRITION CONSULT NOTE   Indication: complicated diverticulitis  Patient Measurements: Height: 5' (152.4 cm) Weight: 49.9 kg (110 lb 0.2 oz) IBW/kg (Calculated) : 45.5 TPN AdjBW (KG): 49.9 Body mass index is 21.48 kg/m. Usual Weight: 45 kg  Assessment:  71 yo F admitted on 10/7 who presented with weakness and 8lb weight loss over 2 weeks. Patient been eating "poorly" ~ 1 month at home with limited toleration d/t diverticulitis. (Has cutaneous drain in place since April 2023 for diverticulitis w/ abscess). Pharmacy has been consulted for TPN management to improve nutritional status in order to help stimulate positive post-op recovery.   Patient switched Boost supplements to Premier Protein supplements (brought in by family per patient preference on flavor options) and notes nausea/vomiting improved. She has also been able to tolerate sherbet ice cream. Encouraged to try FLD menu options for soups as tolerated.   Patient underwent sigmoid colectomy and colostomy 10/17. Discussed with [facility] PA, plan to discontinue TPN 10/23. OK to wean off today.  Glucose / Insulin: A1c 5.4%; CBG 92-151, CBG checks 4 times daily, vsSSI 1 unit/24hrs, 10 units regular insulin in TPN Electrolytes: 10/20: K 3.7, Phos 3.0, others wnl and stable Renal: 10/20: Scr <1 stable, BUN slightly up (24) Hepatic: 10/19: Alb 2.9, LFTs WNL, 10/15: prealbumin 15, TG 103 Intake / Output; MIVF: UOP: not quantified (4x occurrences), 172m ostomy output, 35 mL drain output; 360 mL PO intake  GI Imaging: 10/07 CTAP colon diverticulosis; no recurrent abscess; complicated acute on chronic sigmoid diverticulitis with progression since June GI Surgeries / Procedures:  10/10 LLQ abscess drain catheter exchange in IR Planning for GI operation- Hartman's procedure this week, pending repeat pre-albumin level (now up to 15) 10/17 sigmoid colectomy and colostomy (Hartman's procedure)  Central access: PICC  (double-lumen) placed 10/08 TPN start date: 10/08  Nutritional Goals: Goal TPN rate is 70 mL/hr (provides 94 g of protein and 1792 kcals per day)  RD Assessment: Estimated Needs Total Energy Estimated Needs: 1600-1800 Total Protein Estimated Needs: 80-95 grams Total Fluid Estimated Needs: >/= 1.6 L  Current Nutrition:  TPN + Soft diet Boost Breeze TID (none given per charting) > DC per pt preference >> Premier High Protein (30g/bottle) - family brings in  Plan:  At 1800, resume continuous TPN but at half rate (35 mL/hr) with plans to discontinue TPN tomorrow.  Electrolytes in TPN: Na 319m/L, K 454mL, Ca 0mE38m, Mg 3mEq3m Phos 24 mmol/L. Max Cl Add standard MVI and trace elements to TPN Remove regular insulin since weaning TPN Continue vsSSI and CBG checks but adjust to TID w/ meals + QHS Monitor TPN labs on Mon/Thurs  Thank you for allowing pharmacy to be a part of this patient's care.  Mary-Dimple NanasrmD, BCPS 12/27/2021 9:51 AM

## 2021-12-27 NOTE — Progress Notes (Signed)
Patient had a stable night. TPN infusing at recommended rate through a dedicated IV line. Antibiotic therapy continued. JP drain flushed with 5 mls saline and output recorded. Midline abdominal incision dressing changed this morning (granulation tissue noted). AM lab draws sent including TXMatch. Hemoglobin levels monitored for possible intervention if needed. Colostomy pouch emptied. Safety maintained at all times.

## 2021-12-28 DIAGNOSIS — K5732 Diverticulitis of large intestine without perforation or abscess without bleeding: Secondary | ICD-10-CM | POA: Diagnosis not present

## 2021-12-28 LAB — GLUCOSE, CAPILLARY
Glucose-Capillary: 140 mg/dL — ABNORMAL HIGH (ref 70–99)
Glucose-Capillary: 135 mg/dL — ABNORMAL HIGH (ref 70–99)
Glucose-Capillary: 95 mg/dL (ref 70–99)
Glucose-Capillary: 107 mg/dL — ABNORMAL HIGH (ref 70–99)

## 2021-12-28 LAB — CBC WITH DIFFERENTIAL/PLATELET
Abs Immature Granulocytes: 0.06 10*3/uL (ref 0.00–0.07)
Basophils Absolute: 0 10*3/uL (ref 0.0–0.1)
Basophils Relative: 1 %
Eosinophils Absolute: 0.2 10*3/uL (ref 0.0–0.5)
Eosinophils Relative: 4 %
HCT: 25.1 % — ABNORMAL LOW (ref 36.0–46.0)
Hemoglobin: 8.5 g/dL — ABNORMAL LOW (ref 12.0–15.0)
Immature Granulocytes: 1 %
Lymphocytes Relative: 27 %
Lymphs Abs: 1.2 10*3/uL (ref 0.7–4.0)
MCH: 34 pg (ref 26.0–34.0)
MCHC: 33.9 g/dL (ref 30.0–36.0)
MCV: 100.4 fL — ABNORMAL HIGH (ref 80.0–100.0)
Monocytes Absolute: 0.5 10*3/uL (ref 0.1–1.0)
Monocytes Relative: 11 %
Neutro Abs: 2.4 10*3/uL (ref 1.7–7.7)
Neutrophils Relative %: 56 %
Platelets: 260 10*3/uL (ref 150–400)
RBC: 2.5 MIL/uL — ABNORMAL LOW (ref 3.87–5.11)
RDW: 13.4 % (ref 11.5–15.5)
WBC: 4.3 10*3/uL (ref 4.0–10.5)
nRBC: 0 % (ref 0.0–0.2)

## 2021-12-28 LAB — COMPREHENSIVE METABOLIC PANEL
ALT: 53 U/L — ABNORMAL HIGH (ref 0–44)
AST: 40 U/L (ref 15–41)
Albumin: 2.5 g/dL — ABNORMAL LOW (ref 3.5–5.0)
Alkaline Phosphatase: 50 U/L (ref 38–126)
Anion gap: 6 (ref 5–15)
BUN: 17 mg/dL (ref 8–23)
CO2: 27 mmol/L (ref 22–32)
Calcium: 9.5 mg/dL (ref 8.9–10.3)
Chloride: 106 mmol/L (ref 98–111)
Creatinine, Ser: 0.69 mg/dL (ref 0.44–1.00)
GFR, Estimated: >60 mL/min (ref >60)
Glucose, Bld: 114 mg/dL — ABNORMAL HIGH (ref 70–99)
Potassium: 3.7 mmol/L (ref 3.5–5.1)
Sodium: 139 mmol/L (ref 135–145)
Total Bilirubin: 0.3 mg/dL (ref 0.3–1.2)
Total Protein: 5.6 g/dL — ABNORMAL LOW (ref 6.5–8.1)

## 2021-12-28 LAB — PHOSPHORUS: Phosphorus: 4.2 mg/dL (ref 2.5–4.6)

## 2021-12-28 LAB — TRIGLYCERIDES: Triglycerides: 117 mg/dL (ref <150)

## 2021-12-28 LAB — MAGNESIUM: Magnesium: 2.1 mg/dL (ref 1.7–2.4)

## 2021-12-28 MED ORDER — PANTOPRAZOLE SODIUM 40 MG PO TBEC
40.0000 mg | DELAYED_RELEASE_TABLET | Freq: Every day | ORAL | Status: DC
  Administered 2021-12-28: 40 mg via ORAL
  Filled 2021-12-28: qty 1

## 2021-12-28 NOTE — Progress Notes (Signed)
Pt refused dressing change due to pain.  Will make day shift RN aware.

## 2021-12-28 NOTE — Progress Notes (Addendum)
PHARMACY - TOTAL PARENTERAL NUTRITION CONSULT NOTE  Indication: Complicated diverticulitis  Patient Measurements: Height: 5' (152.4 cm) Weight: 49.9 kg (110 lb 0.2 oz) IBW/kg (Calculated) : 45.5 TPN AdjBW (KG): 49.9 Body mass index is 21.48 kg/m. Usual Weight: 45 kg  Assessment:  71 yo F admitted on 10/7 who presented with weakness and 8lb weight loss over 2 weeks. Patient been eating "poorly" ~ 1 month at home with limited toleration d/t diverticulitis. (Has cutaneous drain in place since April 2023 for diverticulitis w/ abscess). Pharmacy has been consulted for TPN management to improve nutritional status in order to help stimulate positive post-op recovery. Patient underwent sigmoid colectomy and colostomy 10/17.  Glucose / Insulin: A1c 5.4% - CBGs well controlled without SSI use in the past 24 hr Had 10 units insulin in TPN while at goal rate Electrolytes: all WNL except elevated CoCa at 10.7 (none in TPN; Phos high normal) Renal: SCr < 1, BUN WNL Hepatic: LFTs WNL except mildly elevated ALT, tbili / TG WNL, prealbumin slightly low at 16, albumin 2.5 Intake / Output; MIVF: UOP not quantified (3x occurrences), ostomy 43m GI Imaging: 10/07 CT: colon diverticulosis; complicated AoC sigmoid diverticulitis with progression since June GI Surgeries / Procedures:  10/10 LLQ abscess drain catheter exchange in IR Planning for GI operation- Hartman's procedure this week, pending repeat pre-albumin level (now up to 15) 10/17 sigmoid colectomy and colostomy (Hartman's procedure)  Central access: PICC (double-lumen) placed 12/13/21 TPN start date: 12/13/21  Nutritional Goals: RD Estimated Needs Total Energy Estimated Needs: 1600-1800 Total Protein Estimated Needs: 80-95 grams Total Fluid Estimated Needs: >/= 1.6 L  Current Nutrition:  TPN Premier High Protein (30g/bottle) - family brings in Soft diet - reports eating 30% of meals d/t food preference, endorses nausea but no vomiting;  likes her breakfast food and will try to eat all of it.  Plan:  Stop TPN per Surgery - reduce TPN to 20 ml/hr, then stop at 1800 Change PPI to PO D/C TPN labs   Deneene Tarver D. DMina Marble PharmD, BCPS, BBorger10/23/2023, 10:36 AM

## 2021-12-28 NOTE — Consult Note (Addendum)
[place] Nurse ostomy follow up Stoma type/location: LMQ colostomy Stomal assessment/size: 1 1/2" round pink and moist Peristomal assessment: creasing at 3 and 9 o'clock  drain site at distal edge of pouching area, suture in placed  Protected with piece of barrier ring Treatment options for /peristomal skin: barrier ring Output liquid brown stool present Ostomy pouching: 2pc. 2 3/4" pouch with barrier ring  Education provided: POuch change performed by husband. Cut barrier to fit, assembled to pouch.  Applied barrier ring and applied appliance.  Able to roll closed.  They both verbalize understanding of twice weekly pouch changes, emptying when 1/3 full.  Patient not able to look at stoma without feeling nauseous.  She agrees she will be emptying pouch and spouse will be helping change pouch.  Emotional support provided as we work towards acceptance.  She verbalizes discomfort with audible flatus.   Enrolled patient in [place] Start Discharge program: Yes will today.  Will not follow at this time.  Please re-consult if needed.  Estrellita Ludwig MSN, RN, FNP-BC CWON Wound, Ostomy, Continence Nurse Pager [phone]

## 2021-12-28 NOTE — TOC Progression Note (Signed)
Transition of Care [facility]) - Progression Note    Patient Details  Name: Breonna Gafford MRN: [id number] Date of Birth: 04/08/1950  [facility] of [facility]) CM/SW Contact  Bartholomew Crews, RN Phone Number: [phone] 12/28/2021, 2:32 PM  Clinical Narrative:     Spoke with patient and spouse at the bedside to discuss post acute transition. Currently patient is being weaned from TPN and beginning to tolerate a soft diet. Noted WOC RN is Adult nurse program for ostomy supplies and support. Discussed recommendations for [facility] RN, PT, OT - agreeable. Offered choice of agency - no preference. Referral accepted by [facility]. Patient will need HH Face to Face orders for RN, PT, OT. Family to provide transportation home at discharge. TOC following for transition needs.   Expected Discharge Plan: Home/Self Care Barriers to Discharge: Continued Medical Work up  Expected Discharge Plan and Services Expected Discharge Plan: Home/Self Care   Discharge Planning Services: CM Consult   Living arrangements for the past 2 months: Single Family Home                           HH Arranged: RN, PT, OT [facility] Agency: [place] Date [facility] Agency Contacted: 12/28/21 Time [place]: 1431 Representative spoke with at [place]: [place] Determinants of Health ([place]) Interventions    Readmission Risk Interventions     No data to display

## 2021-12-28 NOTE — Progress Notes (Signed)
6 Days Post-Op   Subjective/Chief Complaint: No new complaints. Having intermittent nausea. Tolerating diet but not eating much due to taste preferences. Ostomy functioning  Objective: Vital signs in last 24 hours: Temp:  [98.5 F (36.9 C)] 98.5 F (36.9 C) (10/22 2151) Pulse Rate:  [92-95] 92 (10/23 0953) Resp:  [18] 18 (10/23 0953) BP: (153-155)/(77-82) 153/77 (10/23 0953) SpO2:  [96 %] 96 % (10/22 2154) Last BM Date : 12/21/21  Intake/Output from previous day: 10/22 0701 - 10/23 0700 In: 2174.3 [P.O.:600; I.V.:1564.3] Out: 26 [Drains:25; Stool:60] Intake/Output this shift: No intake/output data recorded.   Gen:  Alert, NAD, pleasant pale appearing Card:  RRR Pulm:  CTAB, no W/R/R, rate and effort normal Abd: Soft, appropriately tender, RLQ drain SS, midline wound w/ dressing c/d/I. LLQ ostomy pink and viable, stool and air in bag. Msk: no lower extremity edema.     Lab Results:  Recent Labs    12/26/21 0305 12/27/21 0447  WBC 4.3 3.9*  HGB 7.7* 8.1*  HCT 23.4* 25.6*  PLT 197 245    BMET Recent Labs    12/28/21 0345  NA 139  K 3.7  CL 106  CO2 27  GLUCOSE 114*  BUN 17  CREATININE 0.69  CALCIUM 9.5    PT/INR No results for input(s): "LABPROT", "INR" in the last 72 hours. ABG No results for input(s): "PHART", "HCO3" in the last 72 hours.  Invalid input(s): "PCO2", "PO2"  Studies/Results: No results found.  Anti-infectives: Anti-infectives (From admission, onward)    Start     Dose/Rate Route Frequency Ordered Stop   12/17/21 1400  piperacillin-tazobactam (ZOSYN) IVPB 3.375 g        3.375 g 12.5 mL/hr over 240 Minutes Intravenous Every 8 hours 12/17/21 1052 12/27/21 2359   12/12/21 1400  piperacillin-tazobactam (ZOSYN) IVPB 3.375 g        3.375 g 12.5 mL/hr over 240 Minutes Intravenous Every 8 hours 12/12/21 0738 12/17/21 2011   12/12/21 0745  piperacillin-tazobactam (ZOSYN) IVPB 3.375 g        3.375 g 100 mL/hr over 30 Minutes Intravenous   Once 12/12/21 0740 12/12/21 0922   12/12/21 0300  cefTRIAXone (ROCEPHIN) 1 g in sodium chloride 0.9 % 100 mL IVPB        1 g 200 mL/hr over 30 Minutes Intravenous  Once 12/12/21 0252 12/12/21 0527       Assessment/Plan:  Acute on chronic sigmoid diverticulitis with perforation, colonic fistula with IR drain POD 6 s/p SIGMOID COLECTOMY AND COLOSTOMY (HARTMAN'S PROCEDURE) 10/17 DB - Afebrile, hemoglobin 8.1  which is prior combination of acute on chronic anemia secondary to surgical stress, bone marrow depression due to poor nutrition and IV fluids. Repeat CBC am   - continue blake drain (25 ml/24h) - likely out prior to dc - BID moist-to-dry dressing changes - WOC RN for new ostomy education - mobilize and IS   Pain: scheduled tylenol, toradol 15 mg q 8h, low dose oxycodone 2.5-5.0 mg q4h PRN, lidoderm patch, 0.5 mg dilaudid q 2h PRN. D/c entereg today. She has not been using much opioid pain medication   FEN: soft, protein shakes. Stop TNA today ID: Zosyn >> completed 5 days postop    LOS: 16 days    Winferd Humphrey, [facility] 12/28/2021, 10:19 AM Please see Amion for pager number during day hours 7:00am-4:30pm

## 2021-12-28 NOTE — Progress Notes (Signed)
PROGRESS NOTE  Tracy Estrada  DOB: 02-Aug-1950  PCP: [name], NP SEG:[id number]  DOA: 12/11/2021  LOS: 16 days  Hospital Day: 18  Brief narrative: Tracy Estrada is a 71 y.o. female with PMH significant for hyperlipidemia, recurrent UTIs, degenerative disc disease, fibromyalgia, anxiety/depression, diverticulitis with abscess s/p cutaneous drain in April 2023 since when she has a chronic left lower quadrant drain in place. 10/6, patient presented to the ED with generalized weakness subjective fever and chills, nausea, vomiting, poor oral intake Work-up showed acute on chronic sigmoid diverticulitis with perforation, persistent sigmoid colonic fistula.   See below for details.  Subjective: Patient was seen and examined this morning.   Lying on bed.  Not in distress.  Surgery PA was changing the dressing. CBC pending this morning.  Assessment and plan: Acute on chronic sigmoid diverticulitis with perforation/colonic fistula s/p prior LLQ drain placement by IR - 06/2021 Initially started on conservative management with antibiotics. 10/10, underwent catheter exchange and revision by IR. 10/17, underwent Hartman's procedure by general surgery. Improving postop ileus. Remains on TPN.  Noted soft diet started by general surgery today.   Surgery following Drain with serosanguineous drainage Continue IV Zosyn Pain management per general surgery Encourage ambulation Recent Labs  Lab 12/23/21 1345 12/25/21 0640 12/26/21 0305 12/27/21 0447  WBC 10.5 5.9 4.3 3.9*   Urinary tract infection/bilateral hydronephrosis UA on admission was positive for leukocytes, nitrates, bacteria.   Patient was also having increased frequency.   CT showed mild bilateral hydronephrosis without signs of stricture or fistula.   Urine culture showed Klebsiella which is pansensitive. Already treated with Zosyn  Acute anemia Baseline hemoglobin over 11.  Since admission, hemoglobin is sliding down  gradually.  No acute blood loss.   Hemoglobin was low low at 7.7 on 10/21.  It spontaneously improved to 8.1 on 10/22.  Pending CBC this morning. No active blood loss.  Anemia panel this morning did not show any deficiency of iron, vitamin B12 or folate. Recent Labs    06/21/21 0845 06/22/21 0153 12/15/21 0239 12/23/21 1345 12/25/21 0640 12/26/21 0305 12/27/21 0447  HGB  --    < > 11.0* 9.7* 8.2* 7.7* 8.1*  MCV  --    < > 99.1 100.0 99.6 101.3* 103.2*  VITAMINB12 562  --   --   --   --   --  756  FOLATE 16.1  --   --   --   --   --  16.2  FERRITIN 477*  --   --   --   --   --  251  TIBC 209*  --   --   --   --   --  234*  IRON 12*  --   --   --   --   --  88  RETICCTPCT 1.0  --   --   --   --   --  3.4*   < > = values in this interval not displayed.   Anxiety/ depression On Wellbutrin   Hyperlipidemia On pravastatin   Hyperglycemia Does not have history of diabetes.  Hemoglobin A1c 5.4 however currently hyperglycemic likely because of TPN Currently on sliding scale insulin with Accu-Cheks Recent Labs  Lab 12/27/21 1116 12/27/21 1642 12/27/21 2007 12/28/21 0857 12/28/21 1204  GLUCAP 111* 83 115* 140* 135*    Goals of care   Code Status: Full Code   Mobility: Encourage ambulation  Skin assessment:     Nutritional status:  Body mass index is 21.48 kg/m.  Nutrition Problem: Moderate Malnutrition Etiology: acute illness Signs/Symptoms: energy intake < 75% for > 7 days, percent weight loss, mild fat depletion, mild muscle depletion (5% wt loss in 2 weeks) Percent weight loss: 5 %     Diet:  Diet Order             DIET SOFT Room service appropriate? Yes; Fluid consistency: Thin  Diet effective now                   DVT prophylaxis:  enoxaparin (LOVENOX) injection 40 mg Start: 12/23/21 0800   Antimicrobials: IV Zosyn Fluid: TPN Consultants: General surgery, IR Family Communication: Family at bedside  Status is: Inpatient  Continue in-hospital  care because: Needs IV antibiotics, improving postop ileus Level of care: Med-Surg   Dispo: The patient is from: Home              Anticipated d/c is to: Home hopefully in next few days after general surgery clearance              Patient currently is not medically stable to d/c.   Difficult to place patient No     Infusions:   ondansetron (ZOFRAN) IV 8 mg (12/26/21 1134)   TPN ADULT (ION) 20 mL/hr at 12/28/21 1231    Scheduled Meds:  acetaminophen  1,000 mg Oral Q6H   buPROPion  300 mg Oral Daily   busPIRone  5 mg Oral BID   Chlorhexidine Gluconate Cloth  6 each Topical Daily   enoxaparin (LOVENOX) injection  40 mg Subcutaneous Q24H   insulin aspart  0-5 Units Subcutaneous QHS   insulin aspart  0-6 Units Subcutaneous TID WC   ketorolac  15 mg Intravenous Q8H   lidocaine  1 patch Transdermal Q24H   lip balm   Topical BID   pantoprazole  40 mg Oral QHS   sodium chloride flush  10-40 mL Intracatheter Q12H   sodium chloride flush  3 mL Intravenous Q12H   sodium chloride flush  5 mL Intracatheter Q8H    PRN meds: albuterol, hydrALAZINE, HYDROmorphone (DILAUDID) injection, lactose free nutrition, loperamide, magic mouthwash, [DISCONTINUED] ondansetron (ZOFRAN) IV **OR** ondansetron (ZOFRAN) IV, mouth rinse, oxyCODONE, prochlorperazine, sodium chloride, sodium chloride flush   Antimicrobials: Anti-infectives (From admission, onward)    Start     Dose/Rate Route Frequency Ordered Stop   12/17/21 1400  piperacillin-tazobactam (ZOSYN) IVPB 3.375 g        3.375 g 12.5 mL/hr over 240 Minutes Intravenous Every 8 hours 12/17/21 1052 12/27/21 2359   12/12/21 1400  piperacillin-tazobactam (ZOSYN) IVPB 3.375 g        3.375 g 12.5 mL/hr over 240 Minutes Intravenous Every 8 hours 12/12/21 0738 12/17/21 2011   12/12/21 0745  piperacillin-tazobactam (ZOSYN) IVPB 3.375 g        3.375 g 100 mL/hr over 30 Minutes Intravenous  Once 12/12/21 0740 12/12/21 0922   12/12/21 0300  cefTRIAXone  (ROCEPHIN) 1 g in sodium chloride 0.9 % 100 mL IVPB        1 g 200 mL/hr over 30 Minutes Intravenous  Once 12/12/21 0252 12/12/21 0527       Objective: Vitals:   12/27/21 2154 12/28/21 0953  BP: (!) 155/82 (!) 153/77  Pulse: 95 92  Resp:  18  Temp:    SpO2: 96%     Intake/Output Summary (Last 24 hours) at 12/28/2021 1312 Last data filed at 12/27/2021 2150 Gross per 24  hour  Intake 2049.28 ml  Output 25 ml  Net 2024.28 ml   Filed Weights   12/11/21 1614 12/17/21 0900  Weight: 49.9 kg 49.9 kg   Weight change:  Body mass index is 21.48 kg/m.   Physical Exam: General exam: Pleasant, elderly Caucasian female. Skin: No rashes, lesions or ulcers. HEENT: Atraumatic, normocephalic, no obvious bleeding Lungs: Clear to auscultation bilaterally. CVS: Regular rate and rhythm, no murmur GI/Abd soft, mild tenderness around drain site. JP drain in left lower quadrant.  Ostomy bag with liquid stool CNS: Alert, awake, oriented x3 Psychiatry: Mood appropriate Extremities: No pedal edema, no calf tenderness  Data Review: I have personally reviewed the laboratory data and studies available.  F/u labs ordered Unresulted Labs (From admission, onward)     Start     Ordered   12/29/21 0500  CBC with Differential/Platelet  Tomorrow morning,   R       Question:  Specimen collection method  Answer:  IV Team=IV Team collect   12/28/21 0802   12/29/21 2820  Basic metabolic panel  Tomorrow morning,   R       Question:  Specimen collection method  Answer:  IV Team=IV Team collect   12/28/21 0802   12/28/21 0803  CBC with Differential/Platelet  ONCE - STAT,   STAT       Question:  Specimen collection method  Answer:  IV Team=IV Team collect   12/28/21 0802            Signed, Terrilee Croak, MD [facility] 12/28/2021

## 2021-12-28 NOTE — Progress Notes (Signed)
Physical Therapy Treatment Patient Details Name: Tracy Estrada MRN: [id number] DOB: 1950/11/04 Today's Date: 12/28/2021   History of Present Illness 71 y/o female presented to ED on 12/11/21 for abdominal pain, lack of appetite, and weight loss. Admitted for acute on chronic complicated sigmoid diverticulitis. S/p sigmoid colectomy and colostomy on 10/17. PMH: recurrent UTIs, diverticulitis, colostomy, anxiety/depression, fibromyalgia    PT Comments    Pt progressing steadily towards her physical therapy goals, despite reporting increased abdominal pain today and nausea with movement. Pt ambulating 80 ft with a walker at a min guard assist level. Encouraged increased mobilization as tolerated.     Recommendations for follow up therapy are one component of a multi-disciplinary discharge planning process, led by the attending physician.  Recommendations may be updated based on patient status, additional functional criteria and insurance authorization.  Follow Up Recommendations  Home health PT     Assistance Recommended at Discharge PRN  Patient can return home with the following A little help with walking and/or transfers;A little help with bathing/dressing/bathroom;Assistance with cooking/housework;Assist for transportation;Help with stairs or ramp for entrance;Direct supervision/assist for medications management;Direct supervision/assist for financial management   Equipment Recommendations  Rolling walker (2 wheels)    Recommendations for Other Services       Precautions / Restrictions Precautions Precautions: Fall Precaution Comments: colostomy, drain on R side Restrictions Weight Bearing Restrictions: No     Mobility  Bed Mobility Overal bed mobility: Needs Assistance Bed Mobility: Rolling, Sidelying to Sit, Sit to Sidelying Rolling: Min assist Sidelying to sit: Min assist     Sit to sidelying: Min assist General bed mobility comments: Cues for log roll technique     Transfers Overall transfer level: Needs assistance Equipment used: Rolling walker (2 wheels) Transfers: Sit to/from Stand Sit to Stand: Min guard                Ambulation/Gait Ambulation/Gait assistance: Min guard, Supervision Gait Distance (Feet): 80 Feet Assistive device: Rolling walker (2 wheels) Gait Pattern/deviations: Step-through pattern, Decreased stride length, Trunk flexed       General Gait Details: Cues for upright posture, supervision-min guard for safety   Stairs             Wheelchair Mobility    Modified Rankin (Stroke Patients Only)       Balance Overall balance assessment: Needs assistance Sitting-balance support: Single extremity supported, Feet supported Sitting balance-Leahy Scale: Fair     Standing balance support: Bilateral upper extremity supported, Reliant on assistive device for balance Standing balance-Leahy Scale: Poor                              Cognition Arousal/Alertness: Awake/alert Behavior During Therapy: WFL for tasks assessed/performed Overall Cognitive Status: Within Functional Limits for tasks assessed                                 General Comments: improved cognition        Exercises      General Comments        Pertinent Vitals/Pain Pain Assessment Pain Assessment: Faces Faces Pain Scale: Hurts even more Pain Location: abdomen with movement Pain Descriptors / Indicators: Discomfort, Guarding Pain Intervention(s): Limited activity within patient's tolerance, Monitored during session, Premedicated before session    Home Living  Prior Function            PT Goals (current goals can now be found in the care plan section) Acute Rehab PT Goals Patient Stated Goal: less pain Potential to Achieve Goals: Good Progress towards PT goals: Progressing toward goals    Frequency    Min 3X/week      PT Plan Current plan remains  appropriate    Co-evaluation              AM-PAC PT "6 Clicks" Mobility   Outcome Measure  Help needed turning from your back to your side while in a flat bed without using bedrails?: A Little Help needed moving from lying on your back to sitting on the side of a flat bed without using bedrails?: A Little Help needed moving to and from a bed to a chair (including a wheelchair)?: A Little Help needed standing up from a chair using your arms (e.g., wheelchair or bedside chair)?: A Little Help needed to walk in hospital room?: A Little Help needed climbing 3-5 steps with a railing? : A Little 6 Click Score: 18    End of Session   Activity Tolerance: Patient tolerated treatment well Patient left: in bed;with call bell/phone within reach Nurse Communication: Mobility status PT Visit Diagnosis: Unsteadiness on feet (R26.81);Muscle weakness (generalized) (M62.81);Other abnormalities of gait and mobility (R26.89)     Time: 2446-9507 PT Time Calculation (min) (ACUTE ONLY): 18 min  Charges:  $Therapeutic Activity: 8-22 mins                     Wyona Almas, PT, DPT Acute Rehabilitation Services Office [phone]    Deno Etienne 12/28/2021, 4:26 PM

## 2021-12-28 NOTE — Progress Notes (Signed)
Pt taken to bathroom. Jp drain emptied and flushed. Ostomy bag emptied.

## 2021-12-29 LAB — CBC WITH DIFFERENTIAL/PLATELET
Abs Immature Granulocytes: 0.06 10*3/uL (ref 0.00–0.07)
Basophils Absolute: 0 10*3/uL (ref 0.0–0.1)
Basophils Relative: 0 %
Eosinophils Absolute: 0.1 10*3/uL (ref 0.0–0.5)
Eosinophils Relative: 4 %
HCT: 25.9 % — ABNORMAL LOW (ref 36.0–46.0)
Hemoglobin: 8.5 g/dL — ABNORMAL LOW (ref 12.0–15.0)
Immature Granulocytes: 2 %
Lymphocytes Relative: 31 %
Lymphs Abs: 1 10*3/uL (ref 0.7–4.0)
MCH: 33.5 pg (ref 26.0–34.0)
MCHC: 32.8 g/dL (ref 30.0–36.0)
MCV: 102 fL — ABNORMAL HIGH (ref 80.0–100.0)
Monocytes Absolute: 0.4 10*3/uL (ref 0.1–1.0)
Monocytes Relative: 12 %
Neutro Abs: 1.7 10*3/uL (ref 1.7–7.7)
Neutrophils Relative %: 51 %
Platelets: 256 10*3/uL (ref 150–400)
RBC: 2.54 MIL/uL — ABNORMAL LOW (ref 3.87–5.11)
RDW: 13.6 % (ref 11.5–15.5)
WBC: 3.3 10*3/uL — ABNORMAL LOW (ref 4.0–10.5)
nRBC: 0 % (ref 0.0–0.2)

## 2021-12-29 LAB — BASIC METABOLIC PANEL
Anion gap: 7 (ref 5–15)
BUN: 19 mg/dL (ref 8–23)
CO2: 27 mmol/L (ref 22–32)
Calcium: 9.9 mg/dL (ref 8.9–10.3)
Chloride: 106 mmol/L (ref 98–111)
Creatinine, Ser: 0.69 mg/dL (ref 0.44–1.00)
GFR, Estimated: >60 mL/min (ref >60)
Glucose, Bld: 92 mg/dL (ref 70–99)
Potassium: 3.8 mmol/L (ref 3.5–5.1)
Sodium: 140 mmol/L (ref 135–145)

## 2021-12-29 LAB — GLUCOSE, CAPILLARY
Glucose-Capillary: 104 mg/dL — ABNORMAL HIGH (ref 70–99)
Glucose-Capillary: 104 mg/dL — ABNORMAL HIGH (ref 70–99)
Glucose-Capillary: 82 mg/dL (ref 70–99)

## 2021-12-29 MED ORDER — PANTOPRAZOLE SODIUM 40 MG PO TBEC: 40.0000 mg | DELAYED_RELEASE_TABLET | Freq: Every day | ORAL | 0 refills | Status: DC

## 2021-12-29 MED ORDER — LIDOCAINE 5 % EX PTCH: 1.0000 | MEDICATED_PATCH | CUTANEOUS | 0 refills | Status: DC

## 2021-12-29 MED ORDER — ONDANSETRON HCL 4 MG PO TABS: 4.0000 mg | ORAL_TABLET | Freq: Every day | ORAL | 1 refills | Status: DC | PRN

## 2021-12-29 NOTE — Discharge Summary (Signed)
Physician Discharge Summary  Tracy Estrada SWF:[id number] DOB: 1950/10/24 DOA: 12/11/2021  PCP: [name], NP  Admit date: 12/11/2021 Discharge date: 12/29/2021  Admitted From: home Discharge disposition: home   Recommendations for Outpatient Follow-Up:   Home health Cbc, bmp 1 week   Discharge Diagnosis:   Principal Problem:   Sigmoid diverticulitis Active Problems:   Urinary tract infection   Bilateral hydronephrosis   Anxiety and depression   Hyperlipidemia    Discharge Condition: Improved.  Diet recommendation: Low sodium, heart healthy  Wound care: None.  Code status: Full.   History of Present Illness:   Tracy Estrada is a 71 y.o. female with medical history significant of hyperlipidemia, recurrent UTIs, degenerative disc disease, fibromyalgia, anxiety, depression, and diverticulitis with abscess s/p cutaneous drain in place since 06/2021 who presents with complaints of weakness.  Symptoms started approximately 2 weeks ago with subjective fever and chills which she felt like she had the flu.  Noted associated symptoms of nausea, vomiting, and decreased appetite.  She has been drinking fluids which include boost drinks, but not really eating any significant solid foods.  She has been having routine follow-up with surgery and at the interventional radiology, but reports 25 to 30 cc every 2 days or so brownish to red material coming out with foul smell.  She had reported that her weight has been increasing, but over the last 2 weeks she has lost about 8 pounds.  Reports having pain around the drain site for which she has been taking Tylenol intermittently for the symptoms.    Hospital Course by Problem:   Acute on chronic sigmoid diverticulitis with perforation/colonic fistula s/p prior LLQ drain placement by IR - 06/2021 Initially started on conservative management with antibiotics. 10/10, underwent catheter exchange and revision by IR. 10/17,  underwent Hartman's procedure by general surgery. Improving postop ileus- resolved- on diet Surgery following- ok for d/c Drain pulled -finished abx course   Urinary tract infection/bilateral hydronephrosis UA on admission was positive for leukocytes, nitrates, bacteria.   Patient was also having increased frequency.   CT showed mild bilateral hydronephrosis without signs of stricture or fistula.   Urine culture showed Klebsiella which is pansensitive. Already treated with Zosyn   Acute anemia Baseline hemoglobin over 11.  Since admission, hemoglobin is sliding down gradually.  No acute blood loss.   Hemoglobin was low low at 7.7 on 10/21. -stable at 8.5 for days  Anxiety/ depression On Wellbutrin   Hyperlipidemia On pravastatin   Hyperglycemia Does not have history of diabetes.  Hemoglobin A1c 5.4 however currently hyperglycemic likely because of TPN Blood sugars well controlled off TPN- no need for medications     Medical Consultants:   IR GS   Discharge Exam:   Vitals:   12/29/21 1115 12/29/21 1325  BP: 127/61 118/70  Pulse: 87 87  Resp: 16 18  Temp: 99.1 F (37.3 C) 98.3 F (36.8 C)  SpO2: 95% 92%   Vitals:   12/29/21 0427 12/29/21 0700 12/29/21 1115 12/29/21 1325  BP: (!) 154/69 120/60 127/61 118/70  Pulse: 93 91 87 87  Resp: '16 16 16 18  '$ Temp:  (!) 97.5 F (36.4 C) 99.1 F (37.3 C) 98.3 F (36.8 C)  TempSrc:  Oral Oral   SpO2: 98% 94% 95% 92%  Weight:      Height:        General exam: Appears calm and comfortable. Wants to go home and see her dog  The results of significant diagnostics from this hospitalization (including imaging, microbiology, ancillary and laboratory) are listed below for reference.     Procedures and Diagnostic Studies:   CT ABDOMEN PELVIS W CONTRAST  Result Date: 12/12/2021 CLINICAL DATA:  71 year old female with acute abdominal pain. History of perforated diverticulitis and chronic fistula, chronic pelvic drain.  EXAM: CT ABDOMEN AND PELVIS WITH CONTRAST TECHNIQUE: Multidetector CT imaging of the abdomen and pelvis was performed using the standard protocol following bolus administration of intravenous contrast. RADIATION DOSE REDUCTION: This exam was performed according to the departmental dose-optimization program which includes automated exposure control, adjustment of the mA and/or kV according to patient size and/or use of iterative reconstruction technique. CONTRAST:  96m OMNIPAQUE IOHEXOL 350 MG/ML SOLN COMPARISON:  CT Abdomen and Pelvis 08/11/2021 and earlier. FINDINGS: Lower chest: Lower lung volumes with mild dependent atelectasis now at both lung bases. No pericardial or pleural effusion. Hepatobiliary: Negative liver and gallbladder. Pancreas: Stable and within normal limits. Spleen: Negative. Adrenals/Urinary Tract: Adrenal glands remain within normal limits. New bilateral renal pelvis and collecting system enlargement compatible with mild hydronephrosis. Symmetric renal enhancement and there is contrast excretion on delayed images. Symmetric contrast excretion to the ureters, which remain decompressed and traverse the bowel and mesenteric inflammation at the pelvic inlet to the bladder as seen on series 8, image 52. No significant ureteral distortion is evident. There is evidence of soft tissue thickening and adhesion to the abnormal sigmoid mesentery at the dome of the bladder on sagittal image 63. But otherwise only mild bladder wall thickening and no gas or oral contrast within the bladder to suggest fistula. Stomach/Bowel: Oral contrast has reached the rectum without evidence of obstruction. Chronic percutaneous left lower quadrant pigtail drain terminates in the abnormal sigmoid mesentery, stable since June. Regional moderate sigmoid inflammation and wall thickening which appears mildly progressed compared to the June CT. The catheter is located at the proximal 3rd of the inflamed bowel segment. A small  focus of fluid in the distal sigmoid mesentery has regressed since June with trace residual now visible on coronal image 44. Regional small bowel loops are nondilated and opacified with oral contrast, but multiple loops now appear likely adhesed to the abnormal sigmoid including along the cephalad margin coronal image 23 and caudal margin image 27. No extraluminal gas or contrast identified. Upstream descending colon remarkable for diverticulosis but no active inflammation. Similar transverse and ascending colon diverticulosis with no active inflammation. Terminal ileum appears negative. Appendix not delineated. Stomach is mostly decompressed. There is a relatively large 4.5 cm diverticulum of the proximal duodenum, with no associated inflammation. No free air or free fluid. Vascular/Lymphatic: Major arterial structures in the abdomen and pelvis remain patent with mild for age Aortoiliac calcified atherosclerosis. Portal venous system is patent. No lymphadenopathy identified. Reproductive: Surgically absent uterus as before. Ovaries not well delineated. Other: No pelvic free fluid. Musculoskeletal: Stable visualized osseous structures. IMPRESSION: 1. Complicated Acute on chronic Sigmoid Diverticulitis with progression since a June CT in the form of increasing regional small bowel and bladder dome adhesions to the abnormal sigmoid colon. No associated bowel obstruction. No evidence of acute perforation. No evidence of colovesical fistula at this time. 2. Also there is new mild bilateral hydronephrosis, but symmetric renal contrast excretion and opacified ureters are visible on series 8 tracking to the bladder without no significant ureteral stricture or distortion. 3. Chronic left lower quadrant pelvic drain appears stable. No residual/recurrent abscess. 4. Generalized underlying colon diverticulosis. Duodenal diverticulum. Electronically  Signed   By: Genevie Ann M.D.   On: 12/12/2021 04:38     Labs:   Basic  Metabolic Panel: Recent Labs  Lab 12/23/21 0446 12/24/21 0449 12/25/21 0640 12/28/21 0345 12/29/21 0401  NA 136 137 141 139 140  K 4.0 3.9 3.7 3.7 3.8  CL 103 102 105 106 106  CO2 '27 29 29 27 27  '$ GLUCOSE 240* 196* 160* 114* 92  BUN '21 20 18 17 19  '$ CREATININE 0.51 0.47 0.48 0.69 0.69  CALCIUM 9.2 9.0 9.1 9.5 9.9  MG  --  1.9  --  2.1  --   PHOS  --  1.7* 3.0 4.2  --    GFR Estimated Creatinine Clearance: 45 mL/min (by C-G formula based on SCr of 0.69 mg/dL). Liver Function Tests: Recent Labs  Lab 12/24/21 0449 12/28/21 0345  AST 26 40  ALT 36 53*  ALKPHOS 41 50  BILITOT 0.3 0.3  PROT 5.5* 5.6*  ALBUMIN 2.5* 2.5*   No results for input(s): "LIPASE", "AMYLASE" in the last 168 hours. No results for input(s): "AMMONIA" in the last 168 hours. Coagulation profile No results for input(s): "INR", "PROTIME" in the last 168 hours.  CBC: Recent Labs  Lab 12/25/21 0640 12/26/21 0305 12/27/21 0447 12/28/21 1233 12/29/21 0401  WBC 5.9 4.3 3.9* 4.3 3.3*  NEUTROABS  --  2.7  --  2.4 1.7  HGB 8.2* 7.7* 8.1* 8.5* 8.5*  HCT 24.6* 23.4* 25.6* 25.1* 25.9*  MCV 99.6 101.3* 103.2* 100.4* 102.0*  PLT 198 197 245 260 256   Cardiac Enzymes: No results for input(s): "CKTOTAL", "CKMB", "CKMBINDEX", "TROPONINI" in the last 168 hours. BNP: Invalid input(s): "POCBNP" CBG: Recent Labs  Lab 12/28/21 1204 12/28/21 1644 12/28/21 2105 12/29/21 0908 12/29/21 1148  GLUCAP 135* 95 107* 104* 104*   D-Dimer No results for input(s): "DDIMER" in the last 72 hours. Hgb A1c No results for input(s): "HGBA1C" in the last 72 hours. Lipid Profile Recent Labs    12/28/21 0345  TRIG 117   Thyroid function studies No results for input(s): "TSH", "T4TOTAL", "T3FREE", "THYROIDAB" in the last 72 hours.  Invalid input(s): "FREET3" Anemia work up Recent Labs    12/27/21 0447  VITAMINB12 756  FOLATE 16.2  FERRITIN 251  TIBC 234*  IRON 88  RETICCTPCT 3.4*   Microbiology Recent  Results (from the past 240 hour(s))  Surgical pcr screen     Status: None   Collection Time: 12/22/21  5:54 AM   Specimen: Nasal Mucosa; Nasal Swab  Result Value Ref Range Status   MRSA, PCR NEGATIVE NEGATIVE Final   Staphylococcus aureus NEGATIVE NEGATIVE Final    Comment: (NOTE) The Xpert SA Assay (FDA approved for NASAL specimens in patients 2 years of age and older), is one component of a comprehensive surveillance program. It is not intended to diagnose infection nor to guide or monitor treatment. Performed at [facility], [place] [address] [place], [place] [address]      Discharge Instructions:   Discharge Instructions     Discharge instructions   Complete by: As directed    Soft diet   Discharge wound care:   Complete by: As directed    Per general surgery   Increase activity slowly   Complete by: As directed       Allergies as of 12/29/2021       Reactions   Demerol [meperidine] Nausea And Vomiting   Kenalog [triamcinolone Acetonide] Other (See Comments)  Cause vertigo/dizziness   Adhesive [tape] Rash, Other (See Comments)   PAPER TAPE OK        Medication List     TAKE these medications    acetaminophen 325 MG tablet Commonly known as: TYLENOL Take 650 mg by mouth every 6 (six) hours as needed for moderate pain or headache.   buPROPion 150 MG 24 hr tablet Commonly known as: WELLBUTRIN XL Take 2 tablets (300 mg total) by mouth daily. For depression.   busPIRone 5 MG tablet Commonly known as: BUSPAR TAKE 1 TABLET (5 MG TOTAL) BY MOUTH 2 (TWO) TIMES DAILY. FOR ANXIETY   cyanocobalamin 1000 MCG tablet Commonly known as: VITAMIN B12 Take 1,000 mcg by mouth daily.   Fish Oil 500 MG Caps Take 500 mg by mouth daily.   fluticasone 50 MCG/ACT nasal spray Commonly known as: FLONASE Place 2 sprays into the nose daily as needed for allergies.   lidocaine 5 % Commonly known as: LIDODERM Place 1 patch onto the skin daily. Remove & Discard  patch within 12 hours or as directed by MD Start taking on: December 30, 2021   multivitamin with minerals tablet Take 1 tablet by mouth daily. Centrum Silver for Women   ondansetron 4 MG tablet Commonly known as: Zofran Take 1 tablet (4 mg total) by mouth daily as needed for nausea or vomiting.   pantoprazole 40 MG tablet Commonly known as: PROTONIX Take 1 tablet (40 mg total) by mouth at bedtime.   POTASSIUM PO Take 99 mg by mouth daily.   pravastatin 40 MG tablet Commonly known as: PRAVACHOL TAKE 1 TABLET BY MOUTH EVERY DAY FOR CHOLESTEROL What changed: See the new instructions.   VITAMIN D3 PO Take 1 tablet by mouth daily. Unsure of mg               Discharge Care Instructions  (From admission, onward)           Start     Ordered   12/29/21 0000  Discharge wound care:       Comments: Per general surgery   12/29/21 1510            Follow-up Information     Care, [facility] Follow up.   Specialty: [place] Why: someone from the office at [facility] will call to schedule home health visits Contact information: [place] [address] [phone]                  Time coordinating discharge: 45 min  Signed:  Geradine Girt DO  [facility] 12/29/2021, 3:10 PM

## 2021-12-29 NOTE — Progress Notes (Signed)
MD messaged for PICC Line Removal order.

## 2021-12-29 NOTE — Progress Notes (Signed)
PICC line removed per order prior to discharge.  Pressure held to achieve hemostasis.  Aftercare education provided and pt and family verbalized understanding.  Vaseline/gauze/tegaderm applied.

## 2021-12-29 NOTE — Progress Notes (Signed)
7 Days Post-Op   Subjective/Chief Complaint: Not eating much but does not like the food options here. Still with intermittent nausea but not worse with PO intake. No emesis. Pain well controlled. Had yogurt yesterday and protein shake for breakfast. Has not eaten lunch yet  Objective: Vital signs in last 24 hours: Temp:  [97.5 F (36.4 C)-99.1 F (37.3 C)] 98.3 F (36.8 C) (10/24 1325) Pulse Rate:  [87-100] 87 (10/24 1325) Resp:  [16-18] 18 (10/24 1325) BP: (118-158)/(60-86) 118/70 (10/24 1325) SpO2:  [92 %-98 %] 92 % (10/24 1325) Last BM Date : 12/28/21  Intake/Output from previous day: 10/23 0701 - 10/24 0700 In: 125 [P.O.:120] Out: 60 [Drains:10; Stool:50] Intake/Output this shift: No intake/output data recorded.   Gen:  Alert, NAD, pleasant pale appearing Card:  RRR Pulm:  CTAB, no W/R/R, rate and effort normal Abd: Soft, appropriately tender, RLQ drain SS, midline wound w/ dressing c/d/I - wound is shallow and healing well. No surrounding erythema or purulent drainage. LLQ ostomy pink and viable, stool and air in bag. Msk: no lower extremity edema.   Lab Results:  Recent Labs    12/28/21 1233 12/29/21 0401  WBC 4.3 3.3*  HGB 8.5* 8.5*  HCT 25.1* 25.9*  PLT 260 256    BMET Recent Labs    12/28/21 0345 12/29/21 0401  NA 139 140  K 3.7 3.8  CL 106 106  CO2 27 27  GLUCOSE 114* 92  BUN 17 19  CREATININE 0.69 0.69  CALCIUM 9.5 9.9    PT/INR No results for input(s): "LABPROT", "INR" in the last 72 hours. ABG No results for input(s): "PHART", "HCO3" in the last 72 hours.  Invalid input(s): "PCO2", "PO2"  Studies/Results: No results found.  Anti-infectives: Anti-infectives (From admission, onward)    Start     Dose/Rate Route Frequency Ordered Stop   12/17/21 1400  piperacillin-tazobactam (ZOSYN) IVPB 3.375 g        3.375 g 12.5 mL/hr over 240 Minutes Intravenous Every 8 hours 12/17/21 1052 12/27/21 2359   12/12/21 1400  piperacillin-tazobactam  (ZOSYN) IVPB 3.375 g        3.375 g 12.5 mL/hr over 240 Minutes Intravenous Every 8 hours 12/12/21 0738 12/17/21 2011   12/12/21 0745  piperacillin-tazobactam (ZOSYN) IVPB 3.375 g        3.375 g 100 mL/hr over 30 Minutes Intravenous  Once 12/12/21 0740 12/12/21 0922   12/12/21 0300  cefTRIAXone (ROCEPHIN) 1 g in sodium chloride 0.9 % 100 mL IVPB        1 g 200 mL/hr over 30 Minutes Intravenous  Once 12/12/21 0252 12/12/21 0527       Assessment/Plan:  Acute on chronic sigmoid diverticulitis with perforation, colonic fistula with IR drain POD 7 s/p SIGMOID COLECTOMY AND COLOSTOMY (HARTMAN'S PROCEDURE) 10/17 DB - Afebrile, hemoglobin 8.1  which is prior combination of acute on chronic anemia secondary to surgical stress, bone marrow depression due to poor nutrition and IV fluids. Repeat CBC with stable hgb   - continue blake drain (10 ml/24h) - remove today - BID moist-to-dry dressing changes - WOC RN for new ostomy education - mobilize and IS   Pain: scheduled tylenol, toradol 15 mg q 8h, low dose oxycodone 2.5-5.0 mg q4h PRN, lidoderm patch, 0.5 mg dilaudid q 2h PRN. D/c entereg today. She has not been using opioid pain medication   FEN: soft, protein shakes.\ ID: Zosyn >> completed 5 days postop  Dispo: stable for dc if tolerates lunch  LOS: 17 days    [facility] 12/29/2021, 3:03 PM Please see Amion for pager number during day hours 7:00am-4:30pm

## 2021-12-29 NOTE — Progress Notes (Signed)
Abdominal dressing changed.  JP drain dressing changed and flushed.  Ostomy emptied.

## 2021-12-29 NOTE — Progress Notes (Signed)
Occupational Therapy Treatment Patient Details Name: Tracy Estrada MRN: [id number] DOB: 1950-06-22 Today's Date: 12/29/2021   History of present illness 71 y/o female presented to ED on 12/11/21 for abdominal pain, lack of appetite, and weight loss. Admitted for acute on chronic complicated sigmoid diverticulitis. S/p sigmoid colectomy and colostomy on 10/17. PMH: recurrent UTIs, diverticulitis, colostomy, anxiety/depression, fibromyalgia   OT comments  Pt completed bed mobility bathroom transfer, sink level grooming and LB dressing this session. Pt and spouse requesting further education in empty of the ostomy bag and midline dressing. RN made aware. Pt and spouse educated that home health services requested to help with home level d/c.    Recommendations for follow up therapy are one component of a multi-disciplinary discharge planning process, led by the attending physician.  Recommendations may be updated based on patient status, additional functional criteria and insurance authorization.    Follow Up Recommendations  Home health OT    Assistance Recommended at Discharge Frequent or constant Supervision/Assistance  Patient can return home with the following  A little help with walking and/or transfers;A lot of help with bathing/dressing/bathroom;Assistance with cooking/housework;Assist for transportation;Help with stairs or ramp for entrance;Direct supervision/assist for financial management;Direct supervision/assist for medications management   Equipment Recommendations  BSC/3in1    Recommendations for Other Services      Precautions / Restrictions Precautions Precautions: Fall Precaution Comments: colostomy, drain on R side       Mobility Bed Mobility Overal bed mobility: Needs Assistance Bed Mobility: Rolling, Supine to Sit Rolling: Min assist   Supine to sit: Mod assist     General bed mobility comments: pt requires (A) to rolling toward R side and then (A) to elevate  trunk from surface. pt static sitting mod it. pt with bed simulate home bed with 2 pillows no bed rails    Transfers Overall transfer level: Needs assistance Equipment used: Rolling walker (2 wheels) Transfers: Sit to/from Stand Sit to Stand: Min guard                 Balance                                           ADL either performed or assessed with clinical judgement   ADL Overall ADL's : Needs assistance/impaired Eating/Feeding: Set up;Sitting Eating/Feeding Details (indicate cue type and reason): eating potatoes and then yogurt Grooming: Set up;Wash/dry hands;Brushing hair;Standing Grooming Details (indicate cue type and reason): required increased (A) for hair due to tangles but would be able to complete combing of hair if it was clean/ no tangles             Lower Body Dressing: Minimal assistance;Sit to/from stand Lower Body Dressing Details (indicate cue type and reason): educated on positioning to help with comfort and prevent bending Toilet Transfer: Min guard;Rolling walker (2 wheels)           Functional mobility during ADLs: Min guard;Rolling walker (2 wheels)      Extremity/Trunk Assessment Upper Extremity Assessment Upper Extremity Assessment: Overall WFL for tasks assessed   Lower Extremity Assessment Lower Extremity Assessment: LLE deficits/detail LLE Deficits / Details: decreased ability to figure 4 cross compared to the R LE due to abdominal discomfort. Will have family (A) as needed        Vision       Perception     Praxis  Cognition Arousal/Alertness: Awake/alert Behavior During Therapy: WFL for tasks assessed/performed Overall Cognitive Status: Within Functional Limits for tasks assessed                                          Exercises      Shoulder Instructions       General Comments pt pending PICC line removal and JP drain removal so top not changed to make access easier  for RN staff to (A) patient. Pt educated and agreeable    Pertinent Vitals/ Pain       Pain Assessment Pain Assessment: Faces Faces Pain Scale: Hurts a little bit Pain Location: abdomen Pain Descriptors / Indicators: Discomfort, Guarding Pain Intervention(s): Monitored during session, Repositioned  Home Living                                          Prior Functioning/Environment              Frequency  Min 2X/week        Progress Toward Goals  OT Goals(current goals can now be found in the care plan section)  Progress towards OT goals: Progressing toward goals  Acute Rehab OT Goals OT Goal Formulation: With patient Time For Goal Achievement: 01/07/22 Potential to Achieve Goals: Good ADL Goals Pt Will Perform Grooming: standing;with modified independence Pt Will Perform Lower Body Bathing: sit to/from stand;with set-up;with adaptive equipment Pt Will Perform Lower Body Dressing: sit to/from stand;with set-up;with adaptive equipment Pt Will Transfer to Toilet: ambulating;bedside commode;with set-up Pt Will Perform Toileting - Clothing Manipulation and hygiene: with min guard assist;sit to/from stand Additional ADL Goal #1: Pt will perform bed mobility using log roll technique with supervision.  Plan Discharge plan remains appropriate    Co-evaluation                 AM-PAC OT "6 Clicks" Daily Activity     Outcome Measure   Help from another person eating meals?: A Little Help from another person taking care of personal grooming?: A Little Help from another person toileting, which includes using toliet, bedpan, or urinal?: A Little Help from another person bathing (including washing, rinsing, drying)?: A Little Help from another person to put on and taking off regular upper body clothing?: A Little Help from another person to put on and taking off regular lower body clothing?: A Lot 6 Click Score: 17    End of Session Equipment  Utilized During Treatment: Rolling walker (2 wheels)  OT Visit Diagnosis: Pain;Other symptoms and signs involving cognitive function;Unsteadiness on feet (R26.81);Other abnormalities of gait and mobility (R26.89);Muscle weakness (generalized) (M62.81)   Activity Tolerance Patient tolerated treatment well   Patient Left in chair;with call bell/phone within reach;with family/visitor present   Nurse Communication Mobility status;Precautions        Time: 7793-9030 OT Time Calculation (min): 28 min  Charges: OT General Charges $OT Visit: 1 Visit OT Treatments $Self Care/Home Management : 23-37 mins   Brynn, OTR/L  Acute Rehabilitation Services Office: [phone] .   Jeri Modena 12/29/2021, 2:26 PM

## 2021-12-30 ENCOUNTER — Telehealth: Payer: Self-pay

## 2021-12-30 DIAGNOSIS — Z86018 Personal history of other benign neoplasm: Secondary | ICD-10-CM | POA: Diagnosis not present

## 2021-12-30 DIAGNOSIS — H8109 Meniere's disease, unspecified ear: Secondary | ICD-10-CM | POA: Diagnosis not present

## 2021-12-30 DIAGNOSIS — K5730 Diverticulosis of large intestine without perforation or abscess without bleeding: Secondary | ICD-10-CM | POA: Diagnosis not present

## 2021-12-30 DIAGNOSIS — M519 Unspecified thoracic, thoracolumbar and lumbosacral intervertebral disc disorder: Secondary | ICD-10-CM | POA: Diagnosis not present

## 2021-12-30 DIAGNOSIS — Z96611 Presence of right artificial shoulder joint: Secondary | ICD-10-CM | POA: Diagnosis not present

## 2021-12-30 DIAGNOSIS — Z9181 History of falling: Secondary | ICD-10-CM | POA: Diagnosis not present

## 2021-12-30 DIAGNOSIS — Z96612 Presence of left artificial shoulder joint: Secondary | ICD-10-CM | POA: Diagnosis not present

## 2021-12-30 DIAGNOSIS — Z433 Encounter for attention to colostomy: Secondary | ICD-10-CM | POA: Diagnosis not present

## 2021-12-30 DIAGNOSIS — N1330 Unspecified hydronephrosis: Secondary | ICD-10-CM | POA: Diagnosis not present

## 2021-12-30 DIAGNOSIS — M797 Fibromyalgia: Secondary | ICD-10-CM | POA: Diagnosis not present

## 2021-12-30 DIAGNOSIS — Z48815 Encounter for surgical aftercare following surgery on the digestive system: Secondary | ICD-10-CM | POA: Diagnosis not present

## 2021-12-30 DIAGNOSIS — E7800 Pure hypercholesterolemia, unspecified: Secondary | ICD-10-CM | POA: Diagnosis not present

## 2021-12-30 DIAGNOSIS — G4700 Insomnia, unspecified: Secondary | ICD-10-CM | POA: Diagnosis not present

## 2021-12-30 DIAGNOSIS — I058 Other rheumatic mitral valve diseases: Secondary | ICD-10-CM | POA: Diagnosis not present

## 2021-12-30 DIAGNOSIS — D649 Anemia, unspecified: Secondary | ICD-10-CM | POA: Diagnosis not present

## 2021-12-30 DIAGNOSIS — Z9049 Acquired absence of other specified parts of digestive tract: Secondary | ICD-10-CM | POA: Diagnosis not present

## 2021-12-30 DIAGNOSIS — F32A Depression, unspecified: Secondary | ICD-10-CM | POA: Diagnosis not present

## 2021-12-30 DIAGNOSIS — F411 Generalized anxiety disorder: Secondary | ICD-10-CM | POA: Diagnosis not present

## 2021-12-30 NOTE — Telephone Encounter (Signed)
Pt has fu appt with surgeon Dr Rush Farmer and does not want to schedule fu with PCP- states she may be switching records to somewhere closer to home - TCM not done

## 2021-12-31 ENCOUNTER — Telehealth: Payer: Self-pay | Admitting: *Deleted

## 2021-12-31 DIAGNOSIS — Z433 Encounter for attention to colostomy: Secondary | ICD-10-CM | POA: Diagnosis not present

## 2021-12-31 DIAGNOSIS — N1330 Unspecified hydronephrosis: Secondary | ICD-10-CM | POA: Diagnosis not present

## 2021-12-31 DIAGNOSIS — E7800 Pure hypercholesterolemia, unspecified: Secondary | ICD-10-CM | POA: Diagnosis not present

## 2021-12-31 DIAGNOSIS — M519 Unspecified thoracic, thoracolumbar and lumbosacral intervertebral disc disorder: Secondary | ICD-10-CM | POA: Diagnosis not present

## 2021-12-31 DIAGNOSIS — K5730 Diverticulosis of large intestine without perforation or abscess without bleeding: Secondary | ICD-10-CM | POA: Diagnosis not present

## 2021-12-31 DIAGNOSIS — Z48815 Encounter for surgical aftercare following surgery on the digestive system: Secondary | ICD-10-CM | POA: Diagnosis not present

## 2021-12-31 NOTE — Patient Outreach (Signed)
  Care Coordination TOC Note Transition Care Management Follow-up Telephone Call Date of discharge and from where: [facility] [phone] How have you been since you were released from the hospital? I am a little sore Any questions or concerns? No  Items Reviewed: Did the pt receive and understand the discharge instructions provided? Yes  Medications obtained and verified? Yes  Other? No  Any new allergies since your discharge? No  Dietary orders reviewed? Yes low sodium heart healthy soft diet/ and patient is drinking boost Do you have support at home? Yes   Home Care and Equipment/Supplies: Were home health services ordered? Yes RN, PT and OT If so, what is the name of the agency? [facility]  Has the agency set up a time to come to the patient's home? yes Were any new equipment or medical supplies ordered?  No What is the name of the medical supply agency? N/a Were you able to get the supplies/equipment? not applicable Do you have any questions related to the use of the equipment or supplies? No  Functional Questionnaire: (I = Independent and D = Dependent) ADLs: I  Bathing/Dressing- D  Meal Prep- D  Eating- I  Maintaining continence- I  Transferring/Ambulation- I  Managing Meds- I  Follow up appointments reviewed:  PCP Hospital f/u appt confirmed? No  . [facility] f/u appt confirmed? Yes  Dr Ninfa Linden 01/15/2022 9 AM Are transportation arrangements needed? No  If their condition worsens, is the pt aware to call PCP or go to the Emergency Dept.? Yes Was the patient provided with contact information for the PCP's office or ED? Yes Was to pt encouraged to call back with questions or concerns? Yes  SDOH assessments and interventions completed:   Yes  Care Coordination Interventions Activated:  Yes   Care Coordination Interventions:  No Care Coordination interventions needed at this time.   Encounter Outcome:  Pt. Visit Completed   [place] Management [phone]

## 2022-01-01 ENCOUNTER — Telehealth: Payer: Self-pay | Admitting: Primary Care

## 2022-01-01 DIAGNOSIS — Z433 Encounter for attention to colostomy: Secondary | ICD-10-CM | POA: Diagnosis not present

## 2022-01-01 DIAGNOSIS — Z48815 Encounter for surgical aftercare following surgery on the digestive system: Secondary | ICD-10-CM | POA: Diagnosis not present

## 2022-01-01 DIAGNOSIS — M519 Unspecified thoracic, thoracolumbar and lumbosacral intervertebral disc disorder: Secondary | ICD-10-CM | POA: Diagnosis not present

## 2022-01-01 DIAGNOSIS — E7800 Pure hypercholesterolemia, unspecified: Secondary | ICD-10-CM | POA: Diagnosis not present

## 2022-01-01 DIAGNOSIS — N1330 Unspecified hydronephrosis: Secondary | ICD-10-CM | POA: Diagnosis not present

## 2022-01-01 DIAGNOSIS — K5730 Diverticulosis of large intestine without perforation or abscess without bleeding: Secondary | ICD-10-CM | POA: Diagnosis not present

## 2022-01-01 NOTE — Telephone Encounter (Signed)
Home Health verbal orders [place] number: [phone]  [id number] OT/PT/Skilled nursing/Social Work/Speech:  Reason:OT  Frequency:1 X W   6  W  Please forward to [facility] pool or providers CMA

## 2022-01-04 DIAGNOSIS — Z48815 Encounter for surgical aftercare following surgery on the digestive system: Secondary | ICD-10-CM | POA: Diagnosis not present

## 2022-01-04 DIAGNOSIS — Z433 Encounter for attention to colostomy: Secondary | ICD-10-CM | POA: Diagnosis not present

## 2022-01-04 DIAGNOSIS — N1330 Unspecified hydronephrosis: Secondary | ICD-10-CM | POA: Diagnosis not present

## 2022-01-04 DIAGNOSIS — K5730 Diverticulosis of large intestine without perforation or abscess without bleeding: Secondary | ICD-10-CM | POA: Diagnosis not present

## 2022-01-04 DIAGNOSIS — M519 Unspecified thoracic, thoracolumbar and lumbosacral intervertebral disc disorder: Secondary | ICD-10-CM | POA: Diagnosis not present

## 2022-01-04 DIAGNOSIS — E7800 Pure hypercholesterolemia, unspecified: Secondary | ICD-10-CM | POA: Diagnosis not present

## 2022-01-04 NOTE — Telephone Encounter (Signed)
Notified Merrie Roof of the approval of verbal orders. Will call with any further questions.

## 2022-01-04 NOTE — Telephone Encounter (Signed)
Approved.  

## 2022-01-05 DIAGNOSIS — Z48815 Encounter for surgical aftercare following surgery on the digestive system: Secondary | ICD-10-CM | POA: Diagnosis not present

## 2022-01-05 DIAGNOSIS — N1330 Unspecified hydronephrosis: Secondary | ICD-10-CM | POA: Diagnosis not present

## 2022-01-05 DIAGNOSIS — K5730 Diverticulosis of large intestine without perforation or abscess without bleeding: Secondary | ICD-10-CM | POA: Diagnosis not present

## 2022-01-05 DIAGNOSIS — Z433 Encounter for attention to colostomy: Secondary | ICD-10-CM | POA: Diagnosis not present

## 2022-01-05 DIAGNOSIS — E7800 Pure hypercholesterolemia, unspecified: Secondary | ICD-10-CM | POA: Diagnosis not present

## 2022-01-05 DIAGNOSIS — M519 Unspecified thoracic, thoracolumbar and lumbosacral intervertebral disc disorder: Secondary | ICD-10-CM | POA: Diagnosis not present

## 2022-01-06 DIAGNOSIS — K5730 Diverticulosis of large intestine without perforation or abscess without bleeding: Secondary | ICD-10-CM | POA: Diagnosis not present

## 2022-01-06 DIAGNOSIS — N1330 Unspecified hydronephrosis: Secondary | ICD-10-CM | POA: Diagnosis not present

## 2022-01-06 DIAGNOSIS — Z48815 Encounter for surgical aftercare following surgery on the digestive system: Secondary | ICD-10-CM | POA: Diagnosis not present

## 2022-01-06 DIAGNOSIS — Z433 Encounter for attention to colostomy: Secondary | ICD-10-CM | POA: Diagnosis not present

## 2022-01-06 DIAGNOSIS — E7800 Pure hypercholesterolemia, unspecified: Secondary | ICD-10-CM | POA: Diagnosis not present

## 2022-01-06 DIAGNOSIS — M519 Unspecified thoracic, thoracolumbar and lumbosacral intervertebral disc disorder: Secondary | ICD-10-CM | POA: Diagnosis not present

## 2022-01-08 DIAGNOSIS — Z433 Encounter for attention to colostomy: Secondary | ICD-10-CM | POA: Diagnosis not present

## 2022-01-08 DIAGNOSIS — K5730 Diverticulosis of large intestine without perforation or abscess without bleeding: Secondary | ICD-10-CM | POA: Diagnosis not present

## 2022-01-08 DIAGNOSIS — Z48815 Encounter for surgical aftercare following surgery on the digestive system: Secondary | ICD-10-CM | POA: Diagnosis not present

## 2022-01-08 DIAGNOSIS — M519 Unspecified thoracic, thoracolumbar and lumbosacral intervertebral disc disorder: Secondary | ICD-10-CM | POA: Diagnosis not present

## 2022-01-08 DIAGNOSIS — N1330 Unspecified hydronephrosis: Secondary | ICD-10-CM | POA: Diagnosis not present

## 2022-01-08 DIAGNOSIS — E7800 Pure hypercholesterolemia, unspecified: Secondary | ICD-10-CM | POA: Diagnosis not present

## 2022-01-11 DIAGNOSIS — K5730 Diverticulosis of large intestine without perforation or abscess without bleeding: Secondary | ICD-10-CM | POA: Diagnosis not present

## 2022-01-11 DIAGNOSIS — Z48815 Encounter for surgical aftercare following surgery on the digestive system: Secondary | ICD-10-CM | POA: Diagnosis not present

## 2022-01-11 DIAGNOSIS — M519 Unspecified thoracic, thoracolumbar and lumbosacral intervertebral disc disorder: Secondary | ICD-10-CM | POA: Diagnosis not present

## 2022-01-11 DIAGNOSIS — E7800 Pure hypercholesterolemia, unspecified: Secondary | ICD-10-CM | POA: Diagnosis not present

## 2022-01-11 DIAGNOSIS — N1330 Unspecified hydronephrosis: Secondary | ICD-10-CM | POA: Diagnosis not present

## 2022-01-11 DIAGNOSIS — Z433 Encounter for attention to colostomy: Secondary | ICD-10-CM | POA: Diagnosis not present

## 2022-01-12 DIAGNOSIS — E7800 Pure hypercholesterolemia, unspecified: Secondary | ICD-10-CM | POA: Diagnosis not present

## 2022-01-12 DIAGNOSIS — M519 Unspecified thoracic, thoracolumbar and lumbosacral intervertebral disc disorder: Secondary | ICD-10-CM | POA: Diagnosis not present

## 2022-01-12 DIAGNOSIS — N1330 Unspecified hydronephrosis: Secondary | ICD-10-CM | POA: Diagnosis not present

## 2022-01-12 DIAGNOSIS — K5730 Diverticulosis of large intestine without perforation or abscess without bleeding: Secondary | ICD-10-CM | POA: Diagnosis not present

## 2022-01-12 DIAGNOSIS — Z48815 Encounter for surgical aftercare following surgery on the digestive system: Secondary | ICD-10-CM | POA: Diagnosis not present

## 2022-01-12 DIAGNOSIS — Z433 Encounter for attention to colostomy: Secondary | ICD-10-CM | POA: Diagnosis not present

## 2022-01-14 DIAGNOSIS — K5730 Diverticulosis of large intestine without perforation or abscess without bleeding: Secondary | ICD-10-CM | POA: Diagnosis not present

## 2022-01-14 DIAGNOSIS — Z433 Encounter for attention to colostomy: Secondary | ICD-10-CM | POA: Diagnosis not present

## 2022-01-14 DIAGNOSIS — N1330 Unspecified hydronephrosis: Secondary | ICD-10-CM | POA: Diagnosis not present

## 2022-01-14 DIAGNOSIS — M519 Unspecified thoracic, thoracolumbar and lumbosacral intervertebral disc disorder: Secondary | ICD-10-CM | POA: Diagnosis not present

## 2022-01-14 DIAGNOSIS — E7800 Pure hypercholesterolemia, unspecified: Secondary | ICD-10-CM | POA: Diagnosis not present

## 2022-01-14 DIAGNOSIS — Z48815 Encounter for surgical aftercare following surgery on the digestive system: Secondary | ICD-10-CM | POA: Diagnosis not present

## 2022-01-14 NOTE — Telephone Encounter (Signed)
[name], wanted to follow up on this patient as instructed. Thank you

## 2022-01-14 NOTE — Telephone Encounter (Signed)
Okay to resume if she's up for it.

## 2022-01-15 NOTE — Telephone Encounter (Signed)
Left message to follow up

## 2022-01-18 DIAGNOSIS — Z433 Encounter for attention to colostomy: Secondary | ICD-10-CM | POA: Diagnosis not present

## 2022-01-18 DIAGNOSIS — N1330 Unspecified hydronephrosis: Secondary | ICD-10-CM | POA: Diagnosis not present

## 2022-01-18 DIAGNOSIS — Z48815 Encounter for surgical aftercare following surgery on the digestive system: Secondary | ICD-10-CM | POA: Diagnosis not present

## 2022-01-18 DIAGNOSIS — E7800 Pure hypercholesterolemia, unspecified: Secondary | ICD-10-CM | POA: Diagnosis not present

## 2022-01-18 DIAGNOSIS — M519 Unspecified thoracic, thoracolumbar and lumbosacral intervertebral disc disorder: Secondary | ICD-10-CM | POA: Diagnosis not present

## 2022-01-18 DIAGNOSIS — K5730 Diverticulosis of large intestine without perforation or abscess without bleeding: Secondary | ICD-10-CM | POA: Diagnosis not present

## 2022-01-21 DIAGNOSIS — Z433 Encounter for attention to colostomy: Secondary | ICD-10-CM | POA: Diagnosis not present

## 2022-01-21 DIAGNOSIS — K5730 Diverticulosis of large intestine without perforation or abscess without bleeding: Secondary | ICD-10-CM | POA: Diagnosis not present

## 2022-01-21 DIAGNOSIS — N1330 Unspecified hydronephrosis: Secondary | ICD-10-CM | POA: Diagnosis not present

## 2022-01-21 DIAGNOSIS — Z48815 Encounter for surgical aftercare following surgery on the digestive system: Secondary | ICD-10-CM | POA: Diagnosis not present

## 2022-01-21 DIAGNOSIS — E7800 Pure hypercholesterolemia, unspecified: Secondary | ICD-10-CM | POA: Diagnosis not present

## 2022-01-21 DIAGNOSIS — M519 Unspecified thoracic, thoracolumbar and lumbosacral intervertebral disc disorder: Secondary | ICD-10-CM | POA: Diagnosis not present

## 2022-01-22 DIAGNOSIS — M519 Unspecified thoracic, thoracolumbar and lumbosacral intervertebral disc disorder: Secondary | ICD-10-CM | POA: Diagnosis not present

## 2022-01-22 DIAGNOSIS — N1330 Unspecified hydronephrosis: Secondary | ICD-10-CM | POA: Diagnosis not present

## 2022-01-22 DIAGNOSIS — K5730 Diverticulosis of large intestine without perforation or abscess without bleeding: Secondary | ICD-10-CM | POA: Diagnosis not present

## 2022-01-22 DIAGNOSIS — E7800 Pure hypercholesterolemia, unspecified: Secondary | ICD-10-CM | POA: Diagnosis not present

## 2022-01-22 DIAGNOSIS — Z48815 Encounter for surgical aftercare following surgery on the digestive system: Secondary | ICD-10-CM | POA: Diagnosis not present

## 2022-01-22 DIAGNOSIS — Z433 Encounter for attention to colostomy: Secondary | ICD-10-CM | POA: Diagnosis not present

## 2022-01-25 ENCOUNTER — Ambulatory Visit (INDEPENDENT_AMBULATORY_CARE_PROVIDER_SITE_OTHER): Payer: Medicare Other | Admitting: Primary Care

## 2022-01-25 ENCOUNTER — Encounter: Payer: Self-pay | Admitting: Primary Care

## 2022-01-25 VITALS — BP 140/80 | HR 67 | Temp 97.7°F | Ht 60.0 in | Wt 111.0 lb

## 2022-01-25 DIAGNOSIS — Z48815 Encounter for surgical aftercare following surgery on the digestive system: Secondary | ICD-10-CM | POA: Diagnosis not present

## 2022-01-25 DIAGNOSIS — E7800 Pure hypercholesterolemia, unspecified: Secondary | ICD-10-CM | POA: Diagnosis not present

## 2022-01-25 DIAGNOSIS — N1330 Unspecified hydronephrosis: Secondary | ICD-10-CM | POA: Diagnosis not present

## 2022-01-25 DIAGNOSIS — D649 Anemia, unspecified: Secondary | ICD-10-CM | POA: Diagnosis not present

## 2022-01-25 DIAGNOSIS — F419 Anxiety disorder, unspecified: Secondary | ICD-10-CM

## 2022-01-25 DIAGNOSIS — M519 Unspecified thoracic, thoracolumbar and lumbosacral intervertebral disc disorder: Secondary | ICD-10-CM | POA: Diagnosis not present

## 2022-01-25 DIAGNOSIS — F32A Depression, unspecified: Secondary | ICD-10-CM

## 2022-01-25 DIAGNOSIS — M810 Age-related osteoporosis without current pathological fracture: Secondary | ICD-10-CM | POA: Diagnosis not present

## 2022-01-25 DIAGNOSIS — Z933 Colostomy status: Secondary | ICD-10-CM

## 2022-01-25 DIAGNOSIS — K5730 Diverticulosis of large intestine without perforation or abscess without bleeding: Secondary | ICD-10-CM | POA: Diagnosis not present

## 2022-01-25 DIAGNOSIS — Z433 Encounter for attention to colostomy: Secondary | ICD-10-CM | POA: Diagnosis not present

## 2022-01-25 NOTE — Assessment & Plan Note (Addendum)
For sigmoid diverticulitis, acute on chronic. S/P Hartman's procedure.  Reviewed hospital labs, imaging, and notes.  Appears to be doing quite well considering. Incisional site appears to be healing well, colostomy bag intact.  Repeat CBC today given anemia during hospital stay.  Follow up with general surgery as scheduled.

## 2022-01-25 NOTE — Assessment & Plan Note (Addendum)
Controlled. Continue buspirone 5 mg BID PRN and bupropion XL 300 mg daily.

## 2022-01-25 NOTE — Assessment & Plan Note (Signed)
Noted during hospitalization in October 2023. Repeat CBC pending.

## 2022-01-25 NOTE — Patient Instructions (Signed)
Stop by the lab prior to leaving today. I will notify you of your results once received.   Please schedule a follow up visit for 3 months.  It was a pleasure to see you today!

## 2022-01-25 NOTE — Assessment & Plan Note (Signed)
Need to resume Prolia injections, last one was in February 2023. She will discuss with her surgeon and then update [place].

## 2022-01-25 NOTE — Progress Notes (Signed)
Subjective:    Patient ID: Tracy Estrada, female    DOB: [birth year], 71 y.o.   MRN: [id number]  HPI  [name] is a very pleasant 71 y.o. female with a history of osteoporosis, fibromyalgia, intra abdominal abscess, neutropenia, insomnia, anxiety and depression, diverticulitis who presents today for hospital follow up.  She presented to [facility] on 12/12[facility]21 for a 1-2 week history of nausea with decreased appetite, and weight loss. Her PERC drain was in place from her recent intraluminal abscess and perforation from diverticulitis. Work up in the ED revealed acute on chronic sigmoid diverticulitis with perforation, persistent sigmoid colonic fistula. She was admitted for further evaluation.  During her hospital stay she underwent catheter exchange and revision by IR on 12/15/21. She then underwent Hartman's procedure per general surgery on 12/22/21. She was initiated on TPN feedings. She was treated with IV Zosyn. UA upon arrival was suspicious for cystitis. CT scan showed mild bilateral hydronephrosis. Urine culture was positive for Klebsiella. She was noted to be anemic, didn't require transfusion. Her PERC drain was discontinued. She was discharged home on 12/29/21 with recommendations for PCP follow up and home health services.   Since her discharge home she's seen her surgeon. She is due again for follow up in mid December 2023. She's had no complications with her colostomy bag. She should be eligible for colostomy bag reversal around April 2024. She has been visited by home health nursing and PT several times.   Overall she's feeling better. Her appetite has returned to baseline. She is eating and drinking well. She denies abdominal pain, nausea, vomiting. She denies rectal bleeding. She will undergo colonoscopy in early 2024. She denies an increase in anxiety, feels well managed on her Wellbutrin 300 mg XL and buspirone 5 mg BID.    Review of Systems  Constitutional:  Negative for fever.   Gastrointestinal:  Negative for abdominal pain, constipation, diarrhea, nausea and vomiting.         Past Medical History:  Diagnosis Date   Achilles bursitis or tendinitis    Anxiety state, unspecified    Arthritis    Bilateral hydronephrosis 12/12/2021   Closed fracture of unspecified part of fibula    Depressive disorder, not elsewhere classified    Diverticulosis of colon (without mention of hemorrhage)    Edema    Fibromyalgia    Insomnia, unspecified    Intra-abdominal abscess ([place]) 06/21/2021   Lumbago    Mass of left ovary 06/01/2016   Meniere's disease 1986   MVP (mitral valve prolapse)    Other intra-abdominal and pelvic swelling, mass and lump 06/01/2016   Pain in joint, site unspecified    Peptic ulcer    no testing   Pure hypercholesterolemia    TSH elevation    Urinary tract infection 12/12/2021    Social History   Socioeconomic History   Marital status: Married    Spouse name: Mercedees Convery   Number of children: 2   Years of education: Not on file   Highest education level: Not on file  Occupational History   Occupation: retired    Comment: Network engineer  Tobacco Use   Smoking status: Never    Passive exposure: Never   Smokeless tobacco: Never  Vaping Use   Vaping Use: Never used  Substance and Sexual Activity   Alcohol use: Yes    Comment: 2-3 beers per week or wine   Drug use: No   Sexual activity: Yes    Birth control/protection: Surgical  Other Topics Concern   Not on file  Social History Narrative   Marital status:  Married x 14 years; second marriage;happily married,husband has PTSD.     Children: 2 daughters Tracy Estrada, Tracy Estrada); 7 grandchildren; 2 gg      Lives: with husband, dog/beagle      Employment: retired since 2013; Network engineer      Tobacco: never      Alcohol: weekends; 1-2 glasses of wine per week or beer.      Drugs:  None      Exercise: none in 2018      Seatbelt: 100%      Guns: unloaded.     Caffeine not every day. 7  grandchildren and 2 GG. Organ donor NO.      ADLs: independent with ADLs; drives      Advanced Directives: YES; FULL CODE no prolonged measures.   Social Determinants of Health   Financial Resource Strain: Low Risk  (09/29/2021)   Overall Financial Resource Strain (CARDIA)    Difficulty of Paying Living Expenses: Not hard at all  Food Insecurity: No Food Insecurity (12/17/2021)   Hunger Vital Sign    Worried About Running Out of Food in the Last Year: Never true    Ran Out of Food in the Last Year: Never true  Transportation Needs: No Transportation Needs (12/17/2021)   PRAPARE - Hydrologist (Medical): No    Lack of Transportation (Non-Medical): No  Physical Activity: Insufficiently Active (09/29/2021)   Exercise Vital Sign    Days of Exercise per Week: 2 days    Minutes of Exercise per Session: 20 min  Stress: No Stress Concern Present (09/29/2021)   [place]    Feeling of Stress : Only a little  Social Connections: Moderately Integrated (09/29/2021)   Social Connection and Isolation Panel [NHANES]    Frequency of Communication with Friends and Family: More than three times a week    Frequency of Social Gatherings with Friends and Family: Once a week    Attends Religious Services: More than 4 times per year    Active Member of [employer] or Organizations: No    Attends Archivist Meetings: Never    Marital Status: Married  Human resources officer Violence: Not At Risk (12/17/2021)   Humiliation, Afraid, Rape, and Kick questionnaire    Fear of Current or Ex-Partner: No    Emotionally Abused: No    Physically Abused: No    Sexually Abused: No    Past Surgical History:  Procedure Laterality Date   ABDOMINAL HYSTERECTOMY  03/08/1980   fibroids; ovaries intact.   BACK SURGERY     x2 Cervical and lumbar   BOWEL RESECTION N/A 12/22/2021   Procedure: SIGMOID COLECTOMY AND COLOSTOMY  (HARTMAN'S PROCEDURE);  Surgeon: Coralie Keens, MD;  Location: [facility];  Service: General;  Laterality: N/A;   COLONOSCOPY  2017   CYSTOSCOPY N/A 06/08/2016   Procedure: Consuela Mimes;  Surgeon: Will Bonnet, MD;  Location: [facility];  Service: Gynecology;  Laterality: N/A;   IR CATHETER TUBE CHANGE  10/21/2021   IR CATHETER TUBE CHANGE  12/15/2021   IR RADIOLOGIST EVAL & MGMT  07/10/2021   IR RADIOLOGIST EVAL & MGMT  08/11/2021   IR RADIOLOGIST EVAL & MGMT  09/09/2021   IR RADIOLOGIST EVAL & MGMT  10/09/2021   IR RADIOLOGIST EVAL & MGMT  11/02/2021   IR RADIOLOGIST EVAL & MGMT  11/24/2021  LAPAROSCOPIC LYSIS OF ADHESIONS  06/08/2016   Procedure: LAPAROSCOPIC LYSIS OF ADHESIONS;  Surgeon: Will Bonnet, MD;  Location: [facility] [place] [facility];  Service: Gynecology;;   LAPAROSCOPIC SALPINGO OOPHERECTOMY Left 06/08/2016   Procedure: LAPAROSCOPIC SALPINGO OOPHORECTOMY;  Surgeon: Will Bonnet, MD;  Location: [facility];  Service: Gynecology;  Laterality: Left;   OOPHORECTOMY     left ovary   SALPINGECTOMY     left   SPINE SURGERY     lumbar spine 11/2015; cervical spine 2009. [place].    Family History  Problem Relation Age of Onset   Hyperlipidemia Mother    Hypertension Mother    Colon polyps Mother    Coronary artery disease Father         AMI age 53   Heart disease Father 50       AMI x 2; first AMI in 29s   Breast cancer Sister 11       with metastasis to bone; smoker   Cancer Sister 38       breast cancer   Colon cancer Maternal Uncle        dx. >50   Colon cancer Paternal Aunt        dx. 61s   Heart attack Paternal Uncle    Stroke Maternal Grandmother    Alzheimer's disease Paternal Grandmother    Heart disease Paternal Grandfather    Heart attack Paternal Grandfather    Healthy Daughter    Healthy Daughter    Breast cancer Cousin    Ovarian cancer Cousin        dx. late 70s/dx. 73s   Lung cancer Cousin        dx. 70s   Stomach cancer Other    Esophageal cancer Neg Hx     Rectal cancer Neg Hx     Allergies  Allergen Reactions   Demerol [Meperidine] Nausea And Vomiting   Kenalog [Triamcinolone Acetonide] Other (See Comments)    Cause vertigo/dizziness   Adhesive [Tape] Rash and Other (See Comments)    PAPER TAPE OK    Current Outpatient Medications on File Prior to Visit  Medication Sig Dispense Refill   acetaminophen (TYLENOL) 325 MG tablet Take 650 mg by mouth every 6 (six) hours as needed for moderate pain or headache.     buPROPion (WELLBUTRIN XL) 150 MG 24 hr tablet Take 2 tablets (300 mg total) by mouth daily. For depression. 180 tablet 3   busPIRone (BUSPAR) 5 MG tablet TAKE 1 TABLET (5 MG TOTAL) BY MOUTH 2 (TWO) TIMES DAILY. FOR ANXIETY 180 tablet 0   Cholecalciferol (VITAMIN D3 PO) Take 1 tablet by mouth daily. Unsure of mg     fluticasone (FLONASE) 50 MCG/ACT nasal spray Place 2 sprays into the nose daily as needed for allergies.      Multiple Vitamins-Minerals (MULTIVITAMIN WITH MINERALS) tablet Take 1 tablet by mouth daily. Centrum Silver for Women     Omega-3 Fatty Acids (FISH OIL) 500 MG CAPS Take 500 mg by mouth daily.     POTASSIUM PO Take 99 mg by mouth daily.     pravastatin (PRAVACHOL) 40 MG tablet TAKE 1 TABLET BY MOUTH EVERY DAY FOR CHOLESTEROL (Patient taking differently: Take 40 mg by mouth at bedtime.) 90 tablet 2   vitamin B-12 (CYANOCOBALAMIN) 1000 MCG tablet Take 1,000 mcg by mouth daily.     lidocaine (LIDODERM) 5 % Place 1 patch onto the skin daily. Remove & Discard patch within 12 hours or as directed by  MD (Patient not taking: Reported on 01/25/2022) 30 patch 0   ondansetron (ZOFRAN) 4 MG tablet Take 1 tablet (4 mg total) by mouth daily as needed for nausea or vomiting. (Patient not taking: Reported on 01/25/2022) 30 tablet 1   pantoprazole (PROTONIX) 40 MG tablet Take 1 tablet (40 mg total) by mouth at bedtime. (Patient not taking: Reported on 01/25/2022) 30 tablet 0   No current facility-administered medications on file  prior to visit.    BP (!) 140/80   Pulse 67   Temp 97.7 F (36.5 C) (Temporal)   Ht 5' (1.524 m)   Wt 111 lb (50.3 kg)   SpO2 99%   BMI 21.68 kg/m  Objective:   Physical Exam Cardiovascular:     Rate and Rhythm: Normal rate and regular rhythm.  Pulmonary:     Effort: Pulmonary effort is normal.     Breath sounds: Normal breath sounds.  Abdominal:     General: Bowel sounds are normal.     Palpations: Abdomen is soft.     Comments: Colostomy bag in place.   Musculoskeletal:     Cervical back: Neck supple.  Skin:    General: Skin is warm and dry.     Findings: No erythema.     Comments: Surgical site appears to be healing well.            Assessment & Plan:   Problem List Items Addressed This Visit       Musculoskeletal and Integument   Osteoporosis    Need to resume Prolia injections, last one was in February 2023. She will discuss with her surgeon and then update [place].        Other   Anxiety and depression    Controlled. Continue buspirone 5 mg BID PRN and bupropion XL 300 mg daily.        Colostomy status ([place])    For sigmoid diverticulitis, acute on chronic. S/P Hartman's procedure.  Reviewed hospital labs, imaging, and notes.  Appears to be doing quite well considering. Incisional site appears to be healing well, colostomy bag intact.  Repeat CBC today given anemia during hospital stay.  Follow up with general surgery as scheduled.       Anemia - Primary    Noted during hospitalization in October 2023. Repeat CBC pending.      Relevant Orders   CBC       [name], NP

## 2022-01-26 LAB — CBC
HCT: 31.5 % — ABNORMAL LOW (ref 36.0–46.0)
Hemoglobin: 10.8 g/dL — ABNORMAL LOW (ref 12.0–15.0)
MCHC: 34.1 g/dL (ref 30.0–36.0)
MCV: 96.3 fl (ref 78.0–100.0)
Platelets: 207 10*3/uL (ref 150.0–400.0)
RBC: 3.27 Mil/uL — ABNORMAL LOW (ref 3.87–5.11)
RDW: 13.8 % (ref 11.5–15.5)
WBC: 3.2 10*3/uL — ABNORMAL LOW (ref 4.0–10.5)

## 2022-01-29 DIAGNOSIS — Z96612 Presence of left artificial shoulder joint: Secondary | ICD-10-CM | POA: Diagnosis not present

## 2022-01-29 DIAGNOSIS — E7800 Pure hypercholesterolemia, unspecified: Secondary | ICD-10-CM | POA: Diagnosis not present

## 2022-01-29 DIAGNOSIS — F32A Depression, unspecified: Secondary | ICD-10-CM | POA: Diagnosis not present

## 2022-01-29 DIAGNOSIS — G4700 Insomnia, unspecified: Secondary | ICD-10-CM | POA: Diagnosis not present

## 2022-01-29 DIAGNOSIS — F411 Generalized anxiety disorder: Secondary | ICD-10-CM | POA: Diagnosis not present

## 2022-01-29 DIAGNOSIS — H8109 Meniere's disease, unspecified ear: Secondary | ICD-10-CM | POA: Diagnosis not present

## 2022-01-29 DIAGNOSIS — Z48815 Encounter for surgical aftercare following surgery on the digestive system: Secondary | ICD-10-CM | POA: Diagnosis not present

## 2022-01-29 DIAGNOSIS — Z96611 Presence of right artificial shoulder joint: Secondary | ICD-10-CM | POA: Diagnosis not present

## 2022-01-29 DIAGNOSIS — Z86018 Personal history of other benign neoplasm: Secondary | ICD-10-CM | POA: Diagnosis not present

## 2022-01-29 DIAGNOSIS — D649 Anemia, unspecified: Secondary | ICD-10-CM | POA: Diagnosis not present

## 2022-01-29 DIAGNOSIS — M519 Unspecified thoracic, thoracolumbar and lumbosacral intervertebral disc disorder: Secondary | ICD-10-CM | POA: Diagnosis not present

## 2022-01-29 DIAGNOSIS — Z9181 History of falling: Secondary | ICD-10-CM | POA: Diagnosis not present

## 2022-01-29 DIAGNOSIS — N1330 Unspecified hydronephrosis: Secondary | ICD-10-CM | POA: Diagnosis not present

## 2022-01-29 DIAGNOSIS — I058 Other rheumatic mitral valve diseases: Secondary | ICD-10-CM | POA: Diagnosis not present

## 2022-01-29 DIAGNOSIS — K5730 Diverticulosis of large intestine without perforation or abscess without bleeding: Secondary | ICD-10-CM | POA: Diagnosis not present

## 2022-01-29 DIAGNOSIS — M797 Fibromyalgia: Secondary | ICD-10-CM | POA: Diagnosis not present

## 2022-01-29 DIAGNOSIS — Z9049 Acquired absence of other specified parts of digestive tract: Secondary | ICD-10-CM | POA: Diagnosis not present

## 2022-01-29 DIAGNOSIS — Z433 Encounter for attention to colostomy: Secondary | ICD-10-CM | POA: Diagnosis not present

## 2022-02-03 DIAGNOSIS — K5730 Diverticulosis of large intestine without perforation or abscess without bleeding: Secondary | ICD-10-CM | POA: Diagnosis not present

## 2022-02-03 DIAGNOSIS — M519 Unspecified thoracic, thoracolumbar and lumbosacral intervertebral disc disorder: Secondary | ICD-10-CM | POA: Diagnosis not present

## 2022-02-03 DIAGNOSIS — N1330 Unspecified hydronephrosis: Secondary | ICD-10-CM | POA: Diagnosis not present

## 2022-02-03 DIAGNOSIS — E7800 Pure hypercholesterolemia, unspecified: Secondary | ICD-10-CM | POA: Diagnosis not present

## 2022-02-03 DIAGNOSIS — Z433 Encounter for attention to colostomy: Secondary | ICD-10-CM | POA: Diagnosis not present

## 2022-02-03 DIAGNOSIS — Z48815 Encounter for surgical aftercare following surgery on the digestive system: Secondary | ICD-10-CM | POA: Diagnosis not present

## 2022-02-08 ENCOUNTER — Encounter: Payer: Self-pay | Admitting: Family Medicine

## 2022-02-08 ENCOUNTER — Ambulatory Visit (INDEPENDENT_AMBULATORY_CARE_PROVIDER_SITE_OTHER)
Admission: RE | Admit: 2022-02-08 | Discharge: 2022-02-08 | Disposition: A | Discharge status: Home / Self Care | Payer: Medicare Other | Source: Ambulatory Visit | Attending: Family Medicine | Admitting: Family Medicine

## 2022-02-08 ENCOUNTER — Ambulatory Visit (INDEPENDENT_AMBULATORY_CARE_PROVIDER_SITE_OTHER): Payer: Medicare Other | Admitting: Family Medicine

## 2022-02-08 VITALS — BP 142/80 | HR 91 | Temp 97.9°F | Ht 60.0 in | Wt 110.1 lb

## 2022-02-08 DIAGNOSIS — M79672 Pain in left foot: Secondary | ICD-10-CM

## 2022-02-08 DIAGNOSIS — L539 Erythematous condition, unspecified: Secondary | ICD-10-CM | POA: Diagnosis not present

## 2022-02-08 DIAGNOSIS — M797 Fibromyalgia: Secondary | ICD-10-CM | POA: Diagnosis not present

## 2022-02-08 DIAGNOSIS — M7989 Other specified soft tissue disorders: Secondary | ICD-10-CM | POA: Diagnosis not present

## 2022-02-08 MED ORDER — CEPHALEXIN 500 MG PO CAPS: 500.0000 mg | ORAL_CAPSULE | Freq: Two times a day (BID) | ORAL | 0 refills | Status: DC

## 2022-02-08 NOTE — Addendum Note (Signed)
Addended by: Loura Pardon A on: 02/08/2022 03:07 PM   Modules accepted: Orders

## 2022-02-08 NOTE — Assessment & Plan Note (Addendum)
2 by 3 cm area of erythema/swelling and warmth dorsal food /prox to 3rd toe  Is mildly tender No trauma   Plan xr of foot today to r/u stress fx  If neg consider poss of cellulitis   Enc to elevate / cool compress  Inst to call if symptoms suddenly worsen  Addendum: xray clear  Keflex sent to pharmacy for poss early cellulitis  Update if not starting to improve in a week or if worsening

## 2022-02-08 NOTE — Progress Notes (Addendum)
Subjective:    Patient ID: Tracy Estrada, female    DOB: [birth year], 71 y.o.   MRN: [id number]  HPI 71 yo pt of NP Clark presents for foot pain and shoulder pain   History of fibromyalgia and chronic shoulder pain in past  Also osteoporosis -on  prolia  OA of both hands , shoulders and knees as well as DDD cervical with fusion  Recently had colectomy for diverticulitis   Wt Readings from Last 3 Encounters:  02/08/22 110 lb 2 oz (50 kg)  01/25/22 111 lb (50.3 kg)  12/17/21 110 lb 0.2 oz (49.9 kg)   21.51 kg/m   Returned from hospital and noted pain in both feet  Her therapist thought she may have plantar fasciitis ?  Pain is not really in hells  Pain is on bottom of feet primarily ,   Left foot is a little swollen (top of foot) Ankles are not swollen   No cramps   Lab Results  Component Value Date   WBC 3.2 (L) 01/25/2022   HGB 10.8 (L) 01/25/2022   HCT 31.5 (L) 01/25/2022   MCV 96.3 01/25/2022   PLT 207.0 01/25/2022   Lab Results  Component Value Date   CREATININE 0.69 12/29/2021   BUN 19 12/29/2021   NA 140 12/29/2021   K 3.8 12/29/2021   CL 106 12/29/2021   CO2 27 12/29/2021    Severe arthritis in shoulders   Has had cortisone injections in her shoulders with Dr Garen Grams  Had to hold off on that during the colon surgery/treatment  Will need double shoulder replacement   Has f/u in march  Wants to puf off more surgery   Xray today DG Foot Complete Left  Result Date: 02/08/2022 CLINICAL DATA:  Left foot pain. Redness and swelling in the third toe. EXAM: LEFT FOOT - COMPLETE 3+ VIEW COMPARISON:  None FINDINGS: Negative for an acute fracture or dislocation. Osteopenia in the left foot. No evidence for bone destruction or periosteal reaction. No focal soft tissue abnormality. Spurring along the plantar aspect of the calcaneus. IMPRESSION: 1. No acute bone abnormality to the left foot. 2. Calcaneal spurring. Electronically Signed   By: Markus Daft M.D.   On:  02/08/2022 13:31     Patient Active Problem List   Diagnosis Date Noted   Foot pain, left 02/08/2022   Erythema of foot 02/08/2022   Memory changes 07/21/2021   Anemia 06/21/2021   Right pelvic adnexal fluid collection 06/21/2021   Colostomy status ([place]) 06/20/2021   Neutropenia ([place]) 01/28/2020   Chronic right shoulder pain 01/28/2020   Foul smelling urine 09/04/2019   Medicare annual wellness visit, subsequent 01/25/2019   Hyperlipidemia 01/25/2019   Osteoporosis 01/25/2019   Rash and nonspecific skin eruption 04/13/2018   Fibromyalgia 09/16/2015   Anxiety and depression 09/16/2015   Insomnia 09/16/2015   Genetic testing 03/21/2015   Family history of breast cancer in sister 02/27/2015   Family history of ovarian cancer 02/27/2015   Family history of colon cancer 02/27/2015   Past Medical History:  Diagnosis Date   Achilles bursitis or tendinitis    Anxiety state, unspecified    Arthritis    Bilateral hydronephrosis 12/12/2021   Closed fracture of unspecified part of fibula    Depressive disorder, not elsewhere classified    Diverticulosis of colon (without mention of hemorrhage)    Edema    Fibromyalgia    Insomnia, unspecified    Intra-abdominal abscess ([place]) 06/21/2021   Lumbago  Mass of left ovary 06/01/2016   Meniere's disease 1986   MVP (mitral valve prolapse)    Other intra-abdominal and pelvic swelling, mass and lump 06/01/2016   Pain in joint, site unspecified    Peptic ulcer    no testing   Pure hypercholesterolemia    TSH elevation    Urinary tract infection 12/12/2021   Past Surgical History:  Procedure Laterality Date   ABDOMINAL HYSTERECTOMY  03/08/1980   fibroids; ovaries intact.   BACK SURGERY     x2 Cervical and lumbar   BOWEL RESECTION N/A 12/22/2021   Procedure: SIGMOID COLECTOMY AND COLOSTOMY (HARTMAN'S PROCEDURE);  Surgeon: Coralie Keens, MD;  Location: [facility];  Service: General;  Laterality: N/A;   COLONOSCOPY  2017   CYSTOSCOPY  N/A 06/08/2016   Procedure: Consuela Mimes;  Surgeon: Will Bonnet, MD;  Location: [facility];  Service: Gynecology;  Laterality: N/A;   IR CATHETER TUBE CHANGE  10/21/2021   IR CATHETER TUBE CHANGE  12/15/2021   IR RADIOLOGIST EVAL & MGMT  07/10/2021   IR RADIOLOGIST EVAL & MGMT  08/11/2021   IR RADIOLOGIST EVAL & MGMT  09/09/2021   IR RADIOLOGIST EVAL & MGMT  10/09/2021   IR RADIOLOGIST EVAL & MGMT  11/02/2021   IR RADIOLOGIST EVAL & MGMT  11/24/2021   LAPAROSCOPIC LYSIS OF ADHESIONS  06/08/2016   Procedure: LAPAROSCOPIC LYSIS OF ADHESIONS;  Surgeon: Will Bonnet, MD;  Location: [facility];  Service: Gynecology;;   LAPAROSCOPIC SALPINGO OOPHERECTOMY Left 06/08/2016   Procedure: LAPAROSCOPIC SALPINGO OOPHORECTOMY;  Surgeon: Will Bonnet, MD;  Location: [facility];  Service: Gynecology;  Laterality: Left;   OOPHORECTOMY     left ovary   SALPINGECTOMY     left   SPINE SURGERY     lumbar spine 11/2015; cervical spine 2009. [facility].   Social History   Tobacco Use   Smoking status: Never    Passive exposure: Never   Smokeless tobacco: Never  Vaping Use   Vaping Use: Never used  Substance Use Topics   Alcohol use: Yes    Comment: 2-3 beers per week or wine   Drug use: No   Family History  Problem Relation Age of Onset   Hyperlipidemia Mother    Hypertension Mother    Colon polyps Mother    Coronary artery disease Father         AMI age 57   Heart disease Father 44       AMI x 2; first AMI in 28s   Breast cancer Sister 37       with metastasis to bone; smoker   Cancer Sister 40       breast cancer   Colon cancer Maternal Uncle        dx. >50   Colon cancer Paternal Aunt        dx. 72s   Heart attack Paternal Uncle    Stroke Maternal Grandmother    Alzheimer's disease Paternal Grandmother    Heart disease Paternal Grandfather    Heart attack Paternal Grandfather    Healthy Daughter    Healthy Daughter    Breast cancer Cousin    Ovarian cancer Cousin        dx.  late 70s/dx. 63s   Lung cancer Cousin        dx. 70s   Stomach cancer Other    Esophageal cancer Neg Hx    Rectal cancer Neg Hx    Allergies  Allergen Reactions  Demerol [Meperidine] Nausea And Vomiting   Kenalog [Triamcinolone Acetonide] Other (See Comments)    Cause vertigo/dizziness   Adhesive [Tape] Rash and Other (See Comments)    PAPER TAPE OK   Current Outpatient Medications on File Prior to Visit  Medication Sig Dispense Refill   acetaminophen (TYLENOL) 325 MG tablet Take 650 mg by mouth every 6 (six) hours as needed for moderate pain or headache.     buPROPion (WELLBUTRIN XL) 150 MG 24 hr tablet Take 2 tablets (300 mg total) by mouth daily. For depression. 180 tablet 3   busPIRone (BUSPAR) 5 MG tablet TAKE 1 TABLET (5 MG TOTAL) BY MOUTH 2 (TWO) TIMES DAILY. FOR ANXIETY 180 tablet 0   Cholecalciferol (VITAMIN D3 PO) Take 1 tablet by mouth daily. Unsure of mg     fluticasone (FLONASE) 50 MCG/ACT nasal spray Place 2 sprays into the nose daily as needed for allergies.      Multiple Vitamins-Minerals (MULTIVITAMIN WITH MINERALS) tablet Take 1 tablet by mouth daily. Centrum Silver for Women     Omega-3 Fatty Acids (FISH OIL) 500 MG CAPS Take 500 mg by mouth daily.     POTASSIUM PO Take 99 mg by mouth daily.     pravastatin (PRAVACHOL) 40 MG tablet TAKE 1 TABLET BY MOUTH EVERY DAY FOR CHOLESTEROL (Patient taking differently: Take 40 mg by mouth at bedtime.) 90 tablet 2   vitamin B-12 (CYANOCOBALAMIN) 1000 MCG tablet Take 1,000 mcg by mouth daily.     No current facility-administered medications on file prior to visit.    Review of Systems  Constitutional:  Positive for fatigue. Negative for activity change, appetite change, fever and unexpected weight change.  HENT:  Negative for congestion, ear pain, rhinorrhea, sinus pressure and sore throat.   Eyes:  Negative for pain, redness and visual disturbance.  Respiratory:  Negative for cough, shortness of breath and wheezing.    Cardiovascular:  Negative for chest pain and palpitations.  Gastrointestinal:  Negative for abdominal pain, blood in stool, constipation and diarrhea.       Healing from colon surgery  Endocrine: Negative for polydipsia and polyuria.  Genitourinary:  Negative for dysuria, frequency and urgency.  Musculoskeletal:  Negative for arthralgias, back pain and myalgias.       Swelling over small area of dorsal L foot  Redness there also   Bottom of both feet hurt to walk  Skin:  Negative for pallor and rash.  Allergic/Immunologic: Negative for environmental allergies.  Neurological:  Negative for dizziness, syncope and headaches.  Hematological:  Negative for adenopathy. Does not bruise/bleed easily.  Psychiatric/Behavioral:  Negative for decreased concentration and dysphoric mood. The patient is not nervous/anxious.        Objective:   Physical Exam Constitutional:      General: She is not in acute distress.    Appearance: Normal appearance. She is normal weight. She is not ill-appearing or diaphoretic.  Eyes:     General:        Right eye: No discharge.        Left eye: No discharge.     Pupils: Pupils are equal, round, and reactive to light.  Cardiovascular:     Rate and Rhythm: Normal rate and regular rhythm.     Heart sounds: Normal heart sounds.  Pulmonary:     Effort: Pulmonary effort is normal. No respiratory distress.     Breath sounds: Normal breath sounds. No wheezing.  Musculoskeletal:     Comments: 2 by  3 cm area of erythema and swelling on L dorsal foot prox to 3rd toe  Mildly tender in that spot  No plantar tenderness of either foot  Pt notes plantar pain with walking/ esp first few steps  No erythema or swelling or legs No palp cord Neg homan's sign bilat  Skin:    General: Skin is warm and dry.     Findings: Erythema present. No bruising, lesion or rash.  Neurological:     Mental Status: She is alert.     Sensory: No sensory deficit.     Coordination:  Coordination normal.     Deep Tendon Reflexes: Reflexes normal.  Psychiatric:        Mood and Affect: Mood normal.           Assessment & Plan:   Problem List Items Addressed This Visit       Other   Erythema of foot    2 by 3 cm area of erythema/swelling and warmth dorsal food /prox to 3rd toe  Is mildly tender No trauma   Plan xr of foot today to r/u stress fx  If neg consider poss of cellulitis   Enc to elevate / cool compress  Inst to call if symptoms suddenly worsen  Addendum: xray clear  Keflex sent to pharmacy for poss early cellulitis  Update if not starting to improve in a week or if worsening        Relevant Orders   DG Foot Complete Left (Completed)   Fibromyalgia    Records reviewed incl last note from Dr Garen Grams A lot of shoulder pain with OA as well (no cort shots recently due to colon surg)  Poss causing bilat foot pain with walking  She is considering orthotics for shoes      Foot pain, left - Primary    With small area of erythema and swelling Known h/o OP No trauma  Xray pending       Relevant Orders   DG Foot Complete Left (Completed)

## 2022-02-08 NOTE — Patient Instructions (Signed)
Let's check an xray of your left foot  Elevate Use ice/cool compress as needed   We will call you with an xray report   Watch for more redness/swelling pain

## 2022-02-08 NOTE — Assessment & Plan Note (Addendum)
With small area of erythema and swelling Known h/o OP No trauma  Xray pending

## 2022-02-08 NOTE — Assessment & Plan Note (Addendum)
Records reviewed incl last note from Dr Garen Grams A lot of shoulder pain with OA as well (no cort shots recently due to colon surg)  Poss causing bilat foot pain with walking  She is considering orthotics for shoes

## 2022-02-09 ENCOUNTER — Telehealth: Payer: Self-pay

## 2022-02-09 ENCOUNTER — Other Ambulatory Visit (HOSPITAL_COMMUNITY): Payer: Self-pay

## 2022-02-09 NOTE — Telephone Encounter (Signed)
Prolia VOB initiated via [email or web address] 

## 2022-02-09 NOTE — Telephone Encounter (Signed)
Do we know what she means?

## 2022-02-10 ENCOUNTER — Telehealth: Payer: Self-pay | Admitting: Primary Care

## 2022-02-10 NOTE — Telephone Encounter (Signed)
I spoke to pt  It primarily huts to walk on (not when sitting)  Xray was neg for fx On keflex She denies blisters or signs of shingles   She needs a re check  Tracy Estrada is pcp  She could come tomorrow (early am or later in afternoon)  , unsure what her avail is Friday  Please call her and set up a follow up with first available , thanks  Will cc to pcp

## 2022-02-10 NOTE — Telephone Encounter (Signed)
Looks like pt is on an abx so will route to Dr. Glori Bickers for review

## 2022-02-10 NOTE — Telephone Encounter (Signed)
Patient is scheduled for 02/12/22 at 3:00

## 2022-02-10 NOTE — Telephone Encounter (Signed)
Patient called in and stated can she can get medication for the redness at the top of her foot, she was told to call back. Call back number [phone].

## 2022-02-10 NOTE — Telephone Encounter (Signed)
Has she started the keflex?  It it improving at all?  Any worse?

## 2022-02-10 NOTE — Telephone Encounter (Signed)
Patient states she started the Keflex on Monday. Patient states she could not hardly walk this morning and was crying due to the pain. She would like to know if she can have pain medication? The Patient states it is worse.

## 2022-02-10 NOTE — Telephone Encounter (Signed)
Noted.  Will evaluate.

## 2022-02-12 ENCOUNTER — Telehealth: Payer: Self-pay | Admitting: Primary Care

## 2022-02-12 ENCOUNTER — Encounter: Payer: Self-pay | Admitting: Primary Care

## 2022-02-12 ENCOUNTER — Ambulatory Visit (INDEPENDENT_AMBULATORY_CARE_PROVIDER_SITE_OTHER): Payer: Medicare Other | Admitting: Primary Care

## 2022-02-12 VITALS — BP 130/70 | HR 94 | Temp 97.4°F | Ht 60.0 in | Wt 107.0 lb

## 2022-02-12 DIAGNOSIS — Z48815 Encounter for surgical aftercare following surgery on the digestive system: Secondary | ICD-10-CM | POA: Diagnosis not present

## 2022-02-12 DIAGNOSIS — M519 Unspecified thoracic, thoracolumbar and lumbosacral intervertebral disc disorder: Secondary | ICD-10-CM | POA: Diagnosis not present

## 2022-02-12 DIAGNOSIS — N1330 Unspecified hydronephrosis: Secondary | ICD-10-CM | POA: Diagnosis not present

## 2022-02-12 DIAGNOSIS — M79672 Pain in left foot: Secondary | ICD-10-CM | POA: Diagnosis not present

## 2022-02-12 DIAGNOSIS — K5730 Diverticulosis of large intestine without perforation or abscess without bleeding: Secondary | ICD-10-CM | POA: Diagnosis not present

## 2022-02-12 DIAGNOSIS — Z433 Encounter for attention to colostomy: Secondary | ICD-10-CM | POA: Diagnosis not present

## 2022-02-12 DIAGNOSIS — E7800 Pure hypercholesterolemia, unspecified: Secondary | ICD-10-CM | POA: Diagnosis not present

## 2022-02-12 MED ORDER — VALACYCLOVIR HCL 1 G PO TABS: 1000.0000 mg | ORAL_TABLET | Freq: Three times a day (TID) | ORAL | 0 refills | Status: DC

## 2022-02-12 NOTE — Telephone Encounter (Signed)
Noted  

## 2022-02-12 NOTE — Assessment & Plan Note (Signed)
Unclear etiology, doesn't appear infectious as she's not seen improvement with four days of cephalexin. Other differentials include gout, herpes zoster.   Checking uric acid and cbc with diff today.  Stop cephalexin. Start Valtrex 1 g TID x 7 days. Discussed that she may not see the best improvement with delayed initiation of Valtrex if she does have herpes zoster.   She will update next week.

## 2022-02-12 NOTE — Telephone Encounter (Signed)
Merrie Roof from [facility] called in and stated that patient would like to be discharged from all services. Thank you!

## 2022-02-12 NOTE — Patient Instructions (Signed)
Start Valtrex 1 g three times daily for 7 days.   Stop the antibiotic medications.   Stop by the lab prior to leaving today. I will notify you of your results once received.   Please update me next week.   It was a pleasure to see you today!

## 2022-02-12 NOTE — Progress Notes (Signed)
Subjective:    Patient ID: Tracy Estrada, female    DOB: [birth year], 71 y.o.   MRN: [id number]  Foot Pain Associated symptoms include arthralgias and joint swelling. Pertinent negatives include no fever.    Tracy Estrada is a very pleasant 71 y.o. female with a history of fibromyalgia, anxiety and depression, insomnia, neutropenia, colostomy status, who presents today for evaluation of   Evaluated by Dr. Glori Bickers on 02/08/22 for a 3-day history of left foot pain. During her visit she was noted to have a small area of erythema, tenderness, and swelling to dorsal side of her left foot proximal to third toe.  She underwent x-ray of her left foot which revealed calcaneal spurring but was otherwise unremarkable.  She was treated with cephalexin 500 mg twice daily x 7 days for potential cellulitis.  Today she continues notice pain, swelling, and tenderness to the left dorsal foot distal to the third toe. She has been compliant to her cephalexin 500 mg BID and has not noticed any improvement in symptoms.   She describes her pain as a burning, tingling, that is worse when walking. She cannot put a shoe on as it causes pain. She denies a history of gout. She has noticed bumps at the site.    Review of Systems  Constitutional:  Negative for fever.  Musculoskeletal:  Positive for arthralgias and joint swelling.  Skin:  Positive for color change.         Past Medical History:  Diagnosis Date   Achilles bursitis or tendinitis    Anxiety state, unspecified    Arthritis    Bilateral hydronephrosis 12/12/2021   Closed fracture of unspecified part of fibula    Depressive disorder, not elsewhere classified    Diverticulosis of colon (without mention of hemorrhage)    Edema    Fibromyalgia    Insomnia, unspecified    Intra-abdominal abscess ([place]) 06/21/2021   Lumbago    Mass of left ovary 06/01/2016   Meniere's disease 1986   MVP (mitral valve prolapse)    Other intra-abdominal and pelvic  swelling, mass and lump 06/01/2016   Pain in joint, site unspecified    Peptic ulcer    no testing   Pure hypercholesterolemia    TSH elevation    Urinary tract infection 12/12/2021    Social History   Socioeconomic History   Marital status: Married    Spouse name: Nayla Dias   Number of children: 2   Years of education: Not on file   Highest education level: Not on file  Occupational History   Occupation: retired    Comment: Network engineer  Tobacco Use   Smoking status: Never    Passive exposure: Never   Smokeless tobacco: Never  Vaping Use   Vaping Use: Never used  Substance and Sexual Activity   Alcohol use: Yes    Comment: 2-3 beers per week or wine   Drug use: No   Sexual activity: Yes    Birth control/protection: Surgical  Other Topics Concern   Not on file  Social History Narrative   Marital status:  Married x 14 years; second marriage;happily married,husband has PTSD.     Children: 2 daughters [name], [name]); 7 grandchildren; 2 gg      Lives: with husband, dog/beagle      Employment: retired since 2013; Network engineer      Tobacco: never      Alcohol: weekends; 1-2 glasses of wine per week or beer.  Drugs:  None      Exercise: none in 2018      Seatbelt: 100%      Guns: unloaded.     Caffeine not every day. 7 grandchildren and 2 GG. Organ donor NO.      ADLs: independent with ADLs; drives      Advanced Directives: YES; FULL CODE no prolonged measures.   Social Determinants of Health   Financial Resource Strain: Low Risk  (09/29/2021)   Overall Financial Resource Strain (CARDIA)    Difficulty of Paying Living Expenses: Not hard at all  Food Insecurity: No Food Insecurity (12/17/2021)   Hunger Vital Sign    Worried About Running Out of Food in the Last Year: Never true    Ran Out of Food in the Last Year: Never true  Transportation Needs: No Transportation Needs (12/17/2021)   PRAPARE - Hydrologist (Medical): No    Lack of  Transportation (Non-Medical): No  Physical Activity: Insufficiently Active (09/29/2021)   Exercise Vital Sign    Days of Exercise per Week: 2 days    Minutes of Exercise per Session: 20 min  Stress: No Stress Concern Present (09/29/2021)   [place]    Feeling of Stress : Only a little  Social Connections: Moderately Integrated (09/29/2021)   Social Connection and Isolation Panel [NHANES]    Frequency of Communication with Friends and Family: More than three times a week    Frequency of Social Gatherings with Friends and Family: Once a week    Attends Religious Services: More than 4 times per year    Active Member of [employer] or Organizations: No    Attends Archivist Meetings: Never    Marital Status: Married  Human resources officer Violence: Not At Risk (12/17/2021)   Humiliation, Afraid, Rape, and Kick questionnaire    Fear of Current or Ex-Partner: No    Emotionally Abused: No    Physically Abused: No    Sexually Abused: No    Past Surgical History:  Procedure Laterality Date   ABDOMINAL HYSTERECTOMY  03/08/1980   fibroids; ovaries intact.   BACK SURGERY     x2 Cervical and lumbar   BOWEL RESECTION N/A 12/22/2021   Procedure: SIGMOID COLECTOMY AND COLOSTOMY (HARTMAN'S PROCEDURE);  Surgeon: Coralie Keens, MD;  Location: [facility];  Service: General;  Laterality: N/A;   COLONOSCOPY  2017   CYSTOSCOPY N/A 06/08/2016   Procedure: Consuela Mimes;  Surgeon: Will Bonnet, MD;  Location: [facility];  Service: Gynecology;  Laterality: N/A;   IR CATHETER TUBE CHANGE  10/21/2021   IR CATHETER TUBE CHANGE  12/15/2021   IR RADIOLOGIST EVAL & MGMT  07/10/2021   IR RADIOLOGIST EVAL & MGMT  08/11/2021   IR RADIOLOGIST EVAL & MGMT  09/09/2021   IR RADIOLOGIST EVAL & MGMT  10/09/2021   IR RADIOLOGIST EVAL & MGMT  11/02/2021   IR RADIOLOGIST EVAL & MGMT  11/24/2021   LAPAROSCOPIC LYSIS OF ADHESIONS  06/08/2016   Procedure:  LAPAROSCOPIC LYSIS OF ADHESIONS;  Surgeon: Will Bonnet, MD;  Location: [facility];  Service: Gynecology;;   LAPAROSCOPIC SALPINGO OOPHERECTOMY Left 06/08/2016   Procedure: LAPAROSCOPIC SALPINGO OOPHORECTOMY;  Surgeon: Will Bonnet, MD;  Location: [facility];  Service: Gynecology;  Laterality: Left;   OOPHORECTOMY     left ovary   SALPINGECTOMY     left   SPINE SURGERY     lumbar spine 11/2015;  cervical spine 2009. [facility].    Family History  Problem Relation Age of Onset   Hyperlipidemia Mother    Hypertension Mother    Colon polyps Mother    Coronary artery disease Father         AMI age 4   Heart disease Father 26       AMI x 2; first AMI in 56s   Breast cancer Sister 65       with metastasis to bone; smoker   Cancer Sister 106       breast cancer   Colon cancer Maternal Uncle        dx. >50   Colon cancer Paternal Aunt        dx. 78s   Heart attack Paternal Uncle    Stroke Maternal Grandmother    Alzheimer's disease Paternal Grandmother    Heart disease Paternal Grandfather    Heart attack Paternal Grandfather    Healthy Daughter    Healthy Daughter    Breast cancer Cousin    Ovarian cancer Cousin        dx. late 70s/dx. 35s   Lung cancer Cousin        dx. 70s   Stomach cancer Other    Esophageal cancer Neg Hx    Rectal cancer Neg Hx     Allergies  Allergen Reactions   Demerol [Meperidine] Nausea And Vomiting   Kenalog [Triamcinolone Acetonide] Other (See Comments)    Cause vertigo/dizziness   Adhesive [Tape] Rash and Other (See Comments)    PAPER TAPE OK    Current Outpatient Medications on File Prior to Visit  Medication Sig Dispense Refill   acetaminophen (TYLENOL) 325 MG tablet Take 650 mg by mouth every 6 (six) hours as needed for moderate pain or headache.     buPROPion (WELLBUTRIN XL) 150 MG 24 hr tablet Take 2 tablets (300 mg total) by mouth daily. For depression. 180 tablet 3   busPIRone (BUSPAR) 5 MG tablet TAKE 1 TABLET (5 MG TOTAL) BY  MOUTH 2 (TWO) TIMES DAILY. FOR ANXIETY 180 tablet 0   Cholecalciferol (VITAMIN D3 PO) Take 1 tablet by mouth daily. Unsure of mg     fluticasone (FLONASE) 50 MCG/ACT nasal spray Place 2 sprays into the nose daily as needed for allergies.      Multiple Vitamins-Minerals (MULTIVITAMIN WITH MINERALS) tablet Take 1 tablet by mouth daily. Centrum Silver for Women     Omega-3 Fatty Acids (FISH OIL) 500 MG CAPS Take 500 mg by mouth daily.     POTASSIUM PO Take 99 mg by mouth daily.     pravastatin (PRAVACHOL) 40 MG tablet TAKE 1 TABLET BY MOUTH EVERY DAY FOR CHOLESTEROL (Patient taking differently: Take 40 mg by mouth at bedtime.) 90 tablet 2   vitamin B-12 (CYANOCOBALAMIN) 1000 MCG tablet Take 1,000 mcg by mouth daily.     No current facility-administered medications on file prior to visit.    BP 130/70   Pulse 94   Temp (!) 97.4 F (36.3 C) (Temporal)   Ht 5' (1.524 m)   Wt 107 lb (48.5 kg)   SpO2 99%   BMI 20.90 kg/m  Objective:   Physical Exam Constitutional:      General: She is not in acute distress. Cardiovascular:     Rate and Rhythm: Normal rate and regular rhythm.  Pulmonary:     Effort: Pulmonary effort is normal.  Skin:    General: Skin is warm and dry.  Findings: Erythema present.     Comments: Several mildly raised bumps to left dorsal foot at site of erythema. No vesicles.            Assessment & Plan:   Problem List Items Addressed This Visit       Other   Foot pain, left - Primary    Unclear etiology, doesn't appear infectious as she's not seen improvement with four days of cephalexin. Other differentials include gout, herpes zoster.   Checking uric acid and cbc with diff today.  Stop cephalexin. Start Valtrex 1 g TID x 7 days. Discussed that she may not see the best improvement with delayed initiation of Valtrex if she does have herpes zoster.   She will update next week.       Relevant Medications   valACYclovir (VALTREX) 1000 MG tablet    Other Relevant Orders   CBC with Differential/Platelet   Uric acid       [name], NP

## 2022-02-13 LAB — CBC WITH DIFFERENTIAL/PLATELET
Absolute Monocytes: 210 cells/uL (ref 200–950)
Basophils Absolute: 11 cells/uL (ref 0–200)
Basophils Relative: 0.3 %
Eosinophils Absolute: 70 cells/uL (ref 15–500)
Eosinophils Relative: 2 %
HCT: 37.1 % (ref 35.0–45.0)
Hemoglobin: 12.5 g/dL (ref 11.7–15.5)
Lymphs Abs: 1607 cells/uL (ref 850–3900)
MCH: 32.6 pg (ref 27.0–33.0)
MCHC: 33.7 g/dL (ref 32.0–36.0)
MCV: 96.9 fL (ref 80.0–100.0)
MPV: 11.1 fL (ref 7.5–12.5)
Monocytes Relative: 6 %
Neutro Abs: 1603 cells/uL (ref 1500–7800)
Neutrophils Relative %: 45.8 %
Platelets: 261 10*3/uL (ref 140–400)
RBC: 3.83 10*6/uL (ref 3.80–5.10)
RDW: 12.3 % (ref 11.0–15.0)
Total Lymphocyte: 45.9 %
WBC: 3.5 10*3/uL — ABNORMAL LOW (ref 3.8–10.8)

## 2022-02-13 LAB — URIC ACID: Uric Acid, Serum: 3.9 mg/dL (ref 2.5–7.0)

## 2022-02-16 DIAGNOSIS — M79672 Pain in left foot: Secondary | ICD-10-CM

## 2022-02-24 ENCOUNTER — Telehealth: Payer: Self-pay

## 2022-02-24 NOTE — Telephone Encounter (Signed)
Okay to resume when patient is ready.

## 2022-02-24 NOTE — Telephone Encounter (Signed)
Looks like patient was due for Prolia but had to cancel? Ok to call to get started back on it or do you want to hold off until next follow up?

## 2022-03-03 ENCOUNTER — Ambulatory Visit (INDEPENDENT_AMBULATORY_CARE_PROVIDER_SITE_OTHER): Payer: Medicare Other | Admitting: Podiatry

## 2022-03-03 ENCOUNTER — Ambulatory Visit (INDEPENDENT_AMBULATORY_CARE_PROVIDER_SITE_OTHER): Payer: Medicare Other

## 2022-03-03 DIAGNOSIS — M778 Other enthesopathies, not elsewhere classified: Secondary | ICD-10-CM | POA: Diagnosis not present

## 2022-03-03 DIAGNOSIS — M7752 Other enthesopathy of left foot: Secondary | ICD-10-CM | POA: Diagnosis not present

## 2022-03-07 NOTE — Progress Notes (Signed)
  Subjective:  Patient ID: Hanna Ra, female    DOB: November 15, 1950,  MRN: [id number]  Chief Complaint  Patient presents with   Foot Pain    NP- top of  Left foot Swollen red and has fever - since 02/08/22 she has had xrays, treatment for shingles, antibiotics - it is not quite as bad as it was, but is still very painful    71 y.o. female presents with the above complaint. History confirmed with patient.  Did not really improve while she was on the antibiotics  Objective:  Physical Exam: warm, good capillary refill, no trophic changes or ulcerative lesions, normal DP and PT pulses, normal sensory exam, and no cellulitis no vesicular rash noted.  She does have an area of edema and tenderness in the second interspace in the intermetatarsal area no palpating Mulder sign or area of pain at the sulcus, she does have pain with lateral compression   Radiographs: Multiple views x-ray of the left foot: no fracture, dislocation, swelling or degenerative changes noted and no stress fracture noted Assessment:   1. Bursitis of intermetatarsal bursa of left foot      Plan:  Patient was evaluated and treated and all questions answered.  Bursitis left foot -Educated on etiology -XR reviewed with patient -Injection delivered to the painful second interspace following sterile prep with alcohol with 5 mg of Kenalog and 2 mg of dexamethasone with 1 cc of Marcaine half percent plain -She will return as needed.  If worsening or returns would require advanced imaging such as ultrasound or MRI  No follow-ups on file.

## 2022-03-23 NOTE — Telephone Encounter (Signed)
Pt ready for scheduling on or after 03/23/22  Out-of-pocket cost due at time of visit: $0  Primary: [place] [employer] Prolia co-insurance: 0% Admin fee co-insurance: 0%   [place] Prol[place] co-[place]: Covers co-insurance  Admin fee co-insurance: Covers co-insurance  Deductible: $0 met of $240 Required (covered by secondary)  Prior Auth:  PA# Valid:     ** This summary of benefits is an estimation of the patient's out-of-pocket cost. Exact cost may very based on individual plan coverage.

## 2022-03-25 NOTE — Telephone Encounter (Signed)
Left message to return call to our office.  Will need lab and Prolia scheduled Ricquel Foulk will need to set up for patient.

## 2022-03-26 NOTE — Telephone Encounter (Signed)
Patient called in returning a call she received.

## 2022-03-30 NOTE — Telephone Encounter (Signed)
Pt called returning Katherine's call. Pt states she can barely walk due to back pain so she wouldn't be able to come for any appts. Call back # [phone]

## 2022-03-30 NOTE — Telephone Encounter (Signed)
Left msg for patient to call back to schedule appt.

## 2022-03-31 NOTE — Telephone Encounter (Signed)
Left vmail for patient to call me back when she is ready to schedule Prolia inj.

## 2022-04-05 ENCOUNTER — Telehealth: Payer: Self-pay | Admitting: Rheumatology

## 2022-04-05 NOTE — Telephone Encounter (Signed)
Patient get a written clearance from Dr. Luciana Axe for cortisone injection.  Please schedule an appointment for a visit and shoulder joint injection.  Right shoulder shoulder x-ray was in November 2021.  She will need repeat x-ray.

## 2022-04-05 NOTE — Telephone Encounter (Signed)
Patient was last seen in the office on 07/22/21 at which time you recommended holding off on proceeding with a shoulder injection due to the healing of the colostomy.   Please clarify if it is ok to schedule a cortisone injection now that she has verbal clearance from Dr. Orest Dikes office.  Would you like written clearance?

## 2022-04-05 NOTE — Telephone Encounter (Signed)
Patient called stating she is experiencing a lot of pain in her right shoulder and would like to get a cortisone injection.  Patient states she spoke with Dr. Berneice Heinrich nurse at [facility] surgery in [place] who gave her clearance to have the injection.  Patient states she is still waiting for colostomy reversal which will be in April.  Please advise

## 2022-04-06 NOTE — Telephone Encounter (Signed)
Advised patient that per Dr. Estanislado Pandy, Patient get a written clearance from Dr. Luciana Axe for cortisone injection. Please schedule an appointment for a visit and shoulder joint injection. Right shoulder shoulder x-ray was in November 2021. She will need repeat x-ray. Patient verbalized understanding, will have letter faxed to our office and is scheduled for 04/23/2022.

## 2022-04-06 NOTE — Telephone Encounter (Signed)
Attempted to contact patient and left message to advise patient to call the office.

## 2022-04-09 NOTE — Progress Notes (Signed)
Office Visit Note  Patient: Tracy Estrada             Date of Birth: 22-Oct-1950           MRN: [id number]             PCP: [name], NP Referring: [name], NP Visit Date: 04/23/2022 Occupation: @GUAROCC$ @  Subjective:  Shoulder joint pain  History of Present Illness: Tracy Estrada is a 72 y.o. female with history of osteoarthritis, degenerative disc disease, fibromyalgia and osteoporosis.  She states she had another bout of diverticulitis October 2023.  She states she had extensive surgery and now she has a colostomy bag.  She is supposed to see her gastroenterologist later this month for possible reversal.  She states that she continues to have severe pain and discomfort in her right shoulder joint.  She could not see an orthopedic surgeon for shoulder joint replacement because of the other medical issues.  She wants to get a cortisone injection in her right shoulder.  Patient has an approval from Dr. Dema Severin to get the cortisone injection.  She does not have much discomfort in her hands or knees.  She continues to have some stiffness in her neck and lower back.  Fibromyalgia is fairly well-controlled per patient.  She receives Prolia injection for osteoporosis from her PCP.    Activities of Daily Living:  Patient reports morning stiffness for 15-20 minutes.   Patient Denies nocturnal pain.  Difficulty dressing/grooming: Reports Difficulty climbing stairs: Reports Difficulty getting out of chair: Denies Difficulty using hands for taps, buttons, cutlery, and/or writing: Reports  Review of Systems  Constitutional:  Positive for fatigue.  HENT:  Negative for mouth sores and mouth dryness.   Eyes:  Positive for dryness.  Respiratory:  Negative for shortness of breath.   Cardiovascular:  Negative for chest pain and palpitations.  Gastrointestinal:  Negative for blood in stool, constipation and diarrhea.  Endocrine: Negative for increased urination.  Genitourinary:   Negative for involuntary urination.  Musculoskeletal:  Positive for joint pain, gait problem, joint pain, joint swelling, myalgias, muscle weakness, morning stiffness, muscle tenderness and myalgias.  Skin:  Positive for sensitivity to sunlight. Negative for color change, rash and hair loss.  Allergic/Immunologic: Negative for susceptible to infections.  Neurological:  Negative for dizziness and headaches.  Hematological:  Negative for swollen glands.  Psychiatric/Behavioral:  Positive for depressed mood. Negative for sleep disturbance. The patient is nervous/anxious.     PMFS History:  Patient Active Problem List   Diagnosis Date Noted   Foot pain, left 02/08/2022   Erythema of foot 02/08/2022   Memory changes 07/21/2021   Anemia 06/21/2021   Right pelvic adnexal fluid collection 06/21/2021   Colostomy status ([place]) 06/20/2021   Neutropenia ([place]) 01/28/2020   Chronic right shoulder pain 01/28/2020   Foul smelling urine 09/04/2019   Medicare annual wellness visit, subsequent 01/25/2019   Hyperlipidemia 01/25/2019   Osteoporosis 01/25/2019   Rash and nonspecific skin eruption 04/13/2018   Fibromyalgia 09/16/2015   Anxiety and depression 09/16/2015   Insomnia 09/16/2015   Genetic testing 03/21/2015   Family history of breast cancer in sister 02/27/2015   Family history of ovarian cancer 02/27/2015   Family history of colon cancer 02/27/2015    Past Medical History:  Diagnosis Date   Achilles bursitis or tendinitis    Anxiety state, unspecified    Arthritis    Bilateral hydronephrosis 12/12/2021   Closed fracture of unspecified part of fibula  Depressive disorder, not elsewhere classified    Diverticulosis of colon (without mention of hemorrhage)    Edema    Fibromyalgia    Insomnia, unspecified    Intra-abdominal abscess ([place]) 06/21/2021   Lumbago    Mass of left ovary 06/01/2016   Meniere's disease 1986   MVP (mitral valve prolapse)    Other intra-abdominal and  pelvic swelling, mass and lump 06/01/2016   Pain in joint, site unspecified    Peptic ulcer    no testing   Pure hypercholesterolemia    TSH elevation    Urinary tract infection 12/12/2021    Family History  Problem Relation Age of Onset   Hyperlipidemia Mother    Hypertension Mother    Colon polyps Mother    Coronary artery disease Father         AMI age 30   Heart disease Father 41       AMI x 2; first AMI in 54s   Breast cancer Sister 21       with metastasis to bone; smoker   Cancer Sister 76       breast cancer   Colon cancer Maternal Uncle        dx. >50   Colon cancer Paternal Aunt        dx. 90s   Heart attack Paternal Uncle    Stroke Maternal Grandmother    Alzheimer's disease Paternal Grandmother    Heart disease Paternal Grandfather    Heart attack Paternal Grandfather    Healthy Daughter    Healthy Daughter    Breast cancer Cousin    Ovarian cancer Cousin        dx. late 70s/dx. 70s   Lung cancer Cousin        dx. 70s   Stomach cancer Other    Esophageal cancer Neg Hx    Rectal cancer Neg Hx    Past Surgical History:  Procedure Laterality Date   ABDOMINAL HYSTERECTOMY  03/08/1980   fibroids; ovaries intact.   BACK SURGERY     x2 Cervical and lumbar   BOWEL RESECTION N/A 12/22/2021   Procedure: SIGMOID COLECTOMY AND COLOSTOMY (HARTMAN'S PROCEDURE);  Surgeon: Coralie Keens, MD;  Location: [facility];  Service: General;  Laterality: N/A;   COLONOSCOPY  2017   CYSTOSCOPY N/A 06/08/2016   Procedure: Consuela Mimes;  Surgeon: Will Bonnet, MD;  Location: [facility];  Service: Gynecology;  Laterality: N/A;   IR CATHETER TUBE CHANGE  10/21/2021   IR CATHETER TUBE CHANGE  12/15/2021   IR RADIOLOGIST EVAL & MGMT  07/10/2021   IR RADIOLOGIST EVAL & MGMT  08/11/2021   IR RADIOLOGIST EVAL & MGMT  09/09/2021   IR RADIOLOGIST EVAL & MGMT  10/09/2021   IR RADIOLOGIST EVAL & MGMT  11/02/2021   IR RADIOLOGIST EVAL & MGMT  11/24/2021   LAPAROSCOPIC LYSIS OF ADHESIONS   06/08/2016   Procedure: LAPAROSCOPIC LYSIS OF ADHESIONS;  Surgeon: Will Bonnet, MD;  Location: [facility];  Service: Gynecology;;   LAPAROSCOPIC SALPINGO OOPHERECTOMY Left 06/08/2016   Procedure: LAPAROSCOPIC SALPINGO OOPHORECTOMY;  Surgeon: Will Bonnet, MD;  Location: [facility];  Service: Gynecology;  Laterality: Left;   OOPHORECTOMY     left ovary   SALPINGECTOMY     left   SPINE SURGERY     lumbar spine 11/2015; cervical spine 2009. [facility].   Social History   Social History Narrative   Marital status:  Married x 14 years; second marriage;happily married,husband has  PTSD.     Children: 2 daughters [name], [name]); 7 grandchildren; 2 gg      Lives: with husband, dog/beagle      Employment: retired since 2013; Network engineer      Tobacco: never      Alcohol: weekends; 1-2 glasses of wine per week or beer.      Drugs:  None      Exercise: none in 2018      Seatbelt: 100%      Guns: unloaded.     Caffeine not every day. 7 grandchildren and 2 GG. Organ donor NO.      ADLs: independent with ADLs; drives      Advanced Directives: YES; FULL CODE no prolonged measures.   Immunization History  Administered Date(s) Administered   Fluad Quad(high Dose 65+) 01/22/2019, 01/09/2020   Influenza,inj,Quad PF,6+ Mos 11/06/2014   Influenza-Unspecified 01/07/2016, 12/13/2016, 11/12/2020   PFIZER(Purple Top)SARS-COV-2 Vaccination 04/19/2019, 05/08/2019   Pneumococcal Conjugate-13 11/06/2014   Pneumococcal Polysaccharide-23 04/07/2016   Tdap 08/10/2011   Zoster Recombinat (Shingrix) 05/15/2018, 09/16/2018, 01/22/2019   Zoster, Live 09/06/2011     Objective: Vital Signs: BP 104/65 (BP Location: Left Arm, Patient Position: Sitting, Cuff Size: Normal)   Pulse 80   Resp 12   Ht 5' (1.524 m)   Wt 110 lb (49.9 kg)   BMI 21.48 kg/m    Physical Exam Vitals and nursing note reviewed.  Constitutional:      Appearance: She is well-developed.  HENT:     Head: Normocephalic and atraumatic.   Eyes:     Conjunctiva/sclera: Conjunctivae normal.  Cardiovascular:     Rate and Rhythm: Normal rate and regular rhythm.     Heart sounds: Normal heart sounds.  Pulmonary:     Effort: Pulmonary effort is normal.     Breath sounds: Normal breath sounds.  Abdominal:     General: Bowel sounds are normal.     Palpations: Abdomen is soft.     Comments: Colostomy bag  Musculoskeletal:     Cervical back: Normal range of motion.  Lymphadenopathy:     Cervical: No cervical adenopathy.  Skin:    General: Skin is warm and dry.     Capillary Refill: Capillary refill takes less than 2 seconds.  Neurological:     Mental Status: She is alert and oriented to person, place, and time.  Psychiatric:        Behavior: Behavior normal.      Musculoskeletal Exam: She had limited lateral rotation of the cervical spine.  She had limited painful range of motion of her lumbar spine.  Right shoulder joint abduction was limited to 90 degrees and forward flexion about 110 degrees.  She had discomfort with range of motion.  Left shoulder joint was in good range of motion.  Elbow joints, wrist joints, MCPs PIPs and DIPs been good range of motion.  She had bilateral PIP and DIP thickening.  Hip joints and knee joints with good range of motion.  She had no tenderness over ankles or MTPs.  CDAI Exam: CDAI Score: -- Patient Global: --; Provider Global: -- Swollen: --; Tender: -- Joint Exam 04/23/2022   No joint exam has been documented for this visit   There is currently no information documented on the homunculus. Go to the Rheumatology activity and complete the homunculus joint exam.  Investigation: No additional findings.  Imaging: No results found.  Recent Labs: Lab Results  Component Value Date   WBC 3.5 (L) 02/12/2022  HGB 12.5 02/12/2022   PLT 261 02/12/2022   NA 140 12/29/2021   K 3.8 12/29/2021   CL 106 12/29/2021   CO2 27 12/29/2021   GLUCOSE 92 12/29/2021   BUN 19 12/29/2021    CREATININE 0.69 12/29/2021   BILITOT 0.3 12/28/2021   ALKPHOS 50 12/28/2021   AST 40 12/28/2021   ALT 53 (H) 12/28/2021   PROT 5.6 (L) 12/28/2021   ALBUMIN 2.5 (L) 12/28/2021   CALCIUM 9.9 12/29/2021   GFRAA >60 06/03/2016    Speciality Comments: No specialty comments available.  Procedures:  Large Joint Inj: R glenohumeral on 04/23/2022 8:56 AM Indications: pain Details: 27 G 1.5 in needle, posterior approach  Arthrogram: No  Medications: 1.5 mL lidocaine 1 %; 40 mg triamcinolone acetonide 40 MG/ML Aspirate: 0 mL Outcome: tolerated well, no immediate complications Procedure, treatment alternatives, risks and benefits explained, specific risks discussed. Consent was given by the patient. Immediately prior to procedure a time out was called to verify the correct patient, procedure, equipment, support staff and site/side marked as required. Patient was prepped and draped in the usual sterile fashion.     Allergies: Demerol [meperidine], Kenalog [triamcinolone acetonide], and Adhesive [tape]   Assessment / Plan:     Visit Diagnoses: Primary osteoarthritis of both hands - Clinical and radiographic findings are consistent with osteoarthritis.  She had bilateral PIP and DIP thickening.  She states the hand pain is manageable.  Joint protection muscle strengthening was discussed.  Primary osteoarthritis of both shoulders - She has severe osteoarthritis of the right shoulder and moderate osteoarthritis of the left shoulder joint. Right shoulder injection 04/14/21 was helpful.  She returns today to get repeat injection.  Patient had clearance from her Dr. Dema Severin to get cortisone injection.  After informed consent was obtained right shoulder joint was injected with lidocaine and Kenalog as described above.Patient tolerated the procedure well.  Postprocedure instructions were given.  A handout on stretching exercises was given.  Patient wants to have shoulder surgery in the future after reversal of  colostomy.  Primary osteoarthritis of both knees - bilateral moderate osteoarthritis and moderate chondromalacia patella noted on the x-rays.  She denies any discomfort in her knee joints today.  DDD (degenerative disc disease), cervical - status post fusion x1 by Dr. Ellene Route.  She had limited range of motion of the cervical spine.  DDD (degenerative disc disease), lumbar - Status post fusion x2 by Dr. Ellene Route. she continues to have some lower back pain.  Fibromyalgia-  She continues to have generalized pain and discomfort from fibromyalgia.  The pain is manageable.  Age-related osteoporosis without current pathological fracture - DEXA on 01/15/19: RFN BMD 0.563 with T-score -2.6.  She is on Prolia injections by her PCP.  Use of calcium rich diet and exercise was emphasized.  Other medical problems listed as follows:  History of hyperlipidemia  History of diverticulitis - hospitalized in April for sigmoid diverticulitis with abscess. 04/16 abscees drained. She has a colostomy bag.  Aspirate showed Pseudomonas aeruginosa  Anxiety and depression  Psychophysiological insomnia  Family history of ovarian cancer  Family history of breast cancer in sister  Family history of colon cancer  Orders: Orders Placed This Encounter  Procedures   Large Joint Inj   No orders of the defined types were placed in this encounter.    Follow-Up Instructions: Return if symptoms worsen or fail to improve, for Osteoarthritis.   Bo Merino, MD  Note - This record has been created using  Editor, commissioning.  Chart creation errors have been sought, but may not always  have been located. Such creation errors do not reflect on  the standard of medical care.

## 2022-04-14 NOTE — Telephone Encounter (Signed)
Left vmail for patient to call back to sch Prolia inj.

## 2022-04-20 NOTE — Telephone Encounter (Signed)
Sent msg via mychart to patient to call when she is ready to schedule Prolia labs and inj appts.

## 2022-04-23 ENCOUNTER — Ambulatory Visit: Payer: Medicare Other | Attending: Rheumatology | Admitting: Rheumatology

## 2022-04-23 ENCOUNTER — Encounter: Payer: Self-pay | Admitting: Rheumatology

## 2022-04-23 VITALS — BP 104/65 | HR 80 | Resp 12 | Ht 60.0 in | Wt 110.0 lb

## 2022-04-23 DIAGNOSIS — M19041 Primary osteoarthritis, right hand: Secondary | ICD-10-CM | POA: Diagnosis not present

## 2022-04-23 DIAGNOSIS — F5104 Psychophysiologic insomnia: Secondary | ICD-10-CM | POA: Diagnosis not present

## 2022-04-23 DIAGNOSIS — F419 Anxiety disorder, unspecified: Secondary | ICD-10-CM | POA: Insufficient documentation

## 2022-04-23 DIAGNOSIS — Z8639 Personal history of other endocrine, nutritional and metabolic disease: Secondary | ICD-10-CM

## 2022-04-23 DIAGNOSIS — M170 Bilateral primary osteoarthritis of knee: Secondary | ICD-10-CM

## 2022-04-23 DIAGNOSIS — Z8719 Personal history of other diseases of the digestive system: Secondary | ICD-10-CM | POA: Diagnosis not present

## 2022-04-23 DIAGNOSIS — M797 Fibromyalgia: Secondary | ICD-10-CM

## 2022-04-23 DIAGNOSIS — M5030 Other cervical disc degeneration, unspecified cervical region: Secondary | ICD-10-CM | POA: Diagnosis not present

## 2022-04-23 DIAGNOSIS — Z800 Family history of malignant neoplasm of digestive organs: Secondary | ICD-10-CM

## 2022-04-23 DIAGNOSIS — M19011 Primary osteoarthritis, right shoulder: Secondary | ICD-10-CM | POA: Diagnosis not present

## 2022-04-23 DIAGNOSIS — M51369 Other intervertebral disc degeneration, lumbar region without mention of lumbar back pain or lower extremity pain: Secondary | ICD-10-CM

## 2022-04-23 DIAGNOSIS — Z8041 Family history of malignant neoplasm of ovary: Secondary | ICD-10-CM | POA: Diagnosis not present

## 2022-04-23 DIAGNOSIS — M5136 Other intervertebral disc degeneration, lumbar region: Secondary | ICD-10-CM | POA: Insufficient documentation

## 2022-04-23 DIAGNOSIS — M19042 Primary osteoarthritis, left hand: Secondary | ICD-10-CM | POA: Diagnosis not present

## 2022-04-23 DIAGNOSIS — M810 Age-related osteoporosis without current pathological fracture: Secondary | ICD-10-CM | POA: Diagnosis not present

## 2022-04-23 DIAGNOSIS — Z803 Family history of malignant neoplasm of breast: Secondary | ICD-10-CM

## 2022-04-23 DIAGNOSIS — M19012 Primary osteoarthritis, left shoulder: Secondary | ICD-10-CM | POA: Diagnosis not present

## 2022-04-23 DIAGNOSIS — F32A Depression, unspecified: Secondary | ICD-10-CM

## 2022-04-23 MED ORDER — TRIAMCINOLONE ACETONIDE 40 MG/ML IJ SUSP
40.0000 mg | INTRAMUSCULAR | Status: AC | PRN
  Administered 2022-04-23: 40 mg via INTRA_ARTICULAR

## 2022-04-23 MED ORDER — LIDOCAINE HCL 1 % IJ SOLN
1.5000 mL | INTRAMUSCULAR | Status: AC | PRN
  Administered 2022-04-23: 1.5 mL

## 2022-04-23 NOTE — Patient Instructions (Signed)

## 2022-04-29 DIAGNOSIS — Z933 Colostomy status: Secondary | ICD-10-CM | POA: Diagnosis not present

## 2022-04-29 DIAGNOSIS — K5720 Diverticulitis of large intestine with perforation and abscess without bleeding: Secondary | ICD-10-CM | POA: Diagnosis not present

## 2022-04-30 ENCOUNTER — Other Ambulatory Visit: Payer: Self-pay

## 2022-04-30 DIAGNOSIS — M810 Age-related osteoporosis without current pathological fracture: Secondary | ICD-10-CM

## 2022-04-30 NOTE — Telephone Encounter (Signed)
Patient sent mychart msg that she is ready to sch Prolia inj.  I called patient to sch appts.  Lab appt on 05/04/22 and Prolia inj on 05/06/22.  Estimated $0 due at time of inj, patient aware.  Need to check labs are ok for inj.

## 2022-05-04 ENCOUNTER — Other Ambulatory Visit (INDEPENDENT_AMBULATORY_CARE_PROVIDER_SITE_OTHER): Payer: Medicare Other

## 2022-05-04 ENCOUNTER — Other Ambulatory Visit: Payer: Self-pay | Admitting: Surgery

## 2022-05-04 DIAGNOSIS — K5720 Diverticulitis of large intestine with perforation and abscess without bleeding: Secondary | ICD-10-CM

## 2022-05-04 DIAGNOSIS — M810 Age-related osteoporosis without current pathological fracture: Secondary | ICD-10-CM

## 2022-05-04 DIAGNOSIS — Z933 Colostomy status: Secondary | ICD-10-CM

## 2022-05-04 LAB — BASIC METABOLIC PANEL
BUN: 26 mg/dL — ABNORMAL HIGH (ref 6–23)
CO2: 32 mEq/L (ref 19–32)
Calcium: 11.5 mg/dL — ABNORMAL HIGH (ref 8.4–10.5)
Chloride: 98 mEq/L (ref 96–112)
Creatinine, Ser: 0.79 mg/dL (ref 0.40–1.20)
GFR: 73.99 mL/min (ref >60.00)
Glucose, Bld: 87 mg/dL (ref 70–99)
Potassium: 4.3 mEq/L (ref 3.5–5.1)
Sodium: 137 mEq/L (ref 135–145)

## 2022-05-05 NOTE — Telephone Encounter (Signed)
Called patient set up lab appointment.will call if any questions.

## 2022-05-05 NOTE — Telephone Encounter (Signed)
Labs ok for Prolia inj per Allie Bossier.  I called patient.  She is taking Calcium OTC, one tablet per day.  She thinks it's 99 mg, but she is currently out.  She plans on getting more soon.

## 2022-05-05 NOTE — Telephone Encounter (Signed)
Noted. Needs lab appointment scheduled for 2 weeks for repeat calcium.

## 2022-05-05 NOTE — Telephone Encounter (Signed)
Patient's labs back for Prolia inj.  Calcium was high yesterday.  Is it still ok to go ahead with inj tomorrow?

## 2022-05-05 NOTE — Telephone Encounter (Signed)
Please call patient:  Is she taking supplemental calcium OTC? If so, how many tablets and what is the strength?  I consulted with pharmacy and supervising physician who agree that it's fine to proceed with Prolia.  Recommend repeat BMP in 2 weeks. Lab only appt is fine. Please schedule.

## 2022-05-06 ENCOUNTER — Other Ambulatory Visit: Payer: Self-pay | Admitting: Primary Care

## 2022-05-06 ENCOUNTER — Ambulatory Visit (INDEPENDENT_AMBULATORY_CARE_PROVIDER_SITE_OTHER): Payer: Medicare Other

## 2022-05-06 DIAGNOSIS — M810 Age-related osteoporosis without current pathological fracture: Secondary | ICD-10-CM

## 2022-05-06 MED ORDER — DENOSUMAB 60 MG/ML ~~LOC~~ SOSY
60.0000 mg | PREFILLED_SYRINGE | Freq: Once | SUBCUTANEOUS | Status: AC
  Administered 2022-05-06: 60 mg via SUBCUTANEOUS

## 2022-05-06 NOTE — Progress Notes (Signed)
Per orders of Tera Helper, injection of Prolia given by [employer]. Patient tolerated injection well.

## 2022-05-19 ENCOUNTER — Encounter: Payer: Self-pay | Admitting: Gastroenterology

## 2022-05-20 ENCOUNTER — Other Ambulatory Visit (INDEPENDENT_AMBULATORY_CARE_PROVIDER_SITE_OTHER): Payer: Medicare Other

## 2022-05-20 ENCOUNTER — Other Ambulatory Visit: Payer: Medicare Other

## 2022-05-20 DIAGNOSIS — M810 Age-related osteoporosis without current pathological fracture: Secondary | ICD-10-CM | POA: Diagnosis not present

## 2022-05-20 LAB — BASIC METABOLIC PANEL
BUN: 23 mg/dL (ref 6–23)
CO2: 30 mEq/L (ref 19–32)
Calcium: 10.1 mg/dL (ref 8.4–10.5)
Chloride: 104 mEq/L (ref 96–112)
Creatinine, Ser: 0.62 mg/dL (ref 0.40–1.20)
GFR: 88.06 mL/min (ref >60.00)
Glucose, Bld: 75 mg/dL (ref 70–99)
Potassium: 3.8 mEq/L (ref 3.5–5.1)
Sodium: 141 mEq/L (ref 135–145)

## 2022-05-28 ENCOUNTER — Ambulatory Visit
Admission: RE | Admit: 2022-05-28 | Discharge: 2022-05-28 | Disposition: A | Discharge status: Home / Self Care | Payer: Medicare Other | Source: Ambulatory Visit | Attending: Surgery | Admitting: Surgery

## 2022-05-28 ENCOUNTER — Other Ambulatory Visit: Payer: Self-pay

## 2022-05-28 DIAGNOSIS — K5730 Diverticulosis of large intestine without perforation or abscess without bleeding: Secondary | ICD-10-CM | POA: Diagnosis not present

## 2022-05-28 DIAGNOSIS — K5720 Diverticulitis of large intestine with perforation and abscess without bleeding: Secondary | ICD-10-CM

## 2022-05-28 DIAGNOSIS — Z933 Colostomy status: Secondary | ICD-10-CM

## 2022-05-31 NOTE — Telephone Encounter (Signed)
Faxed over request for [employer] to send an order to be signed with supplies patient is requesting. Received Fax confirmation. Will look for fax to come through.

## 2022-05-31 NOTE — Telephone Encounter (Signed)
I don't recall ordering these for her, but it's possible. We would need an order form from whoever supplies her with the bags. Maybe we call [employer]?

## 2022-06-30 DIAGNOSIS — M5416 Radiculopathy, lumbar region: Secondary | ICD-10-CM | POA: Diagnosis not present

## 2022-07-05 ENCOUNTER — Other Ambulatory Visit (HOSPITAL_COMMUNITY): Payer: Self-pay | Admitting: Nurse Practitioner

## 2022-07-05 ENCOUNTER — Ambulatory Visit (HOSPITAL_COMMUNITY)
Admission: RE | Admit: 2022-07-05 | Discharge: 2022-07-05 | Disposition: A | Discharge status: Home / Self Care | Payer: Medicare Other | Source: Ambulatory Visit | Attending: Primary Care | Admitting: Primary Care

## 2022-07-05 DIAGNOSIS — L24B3 Irritant contact dermatitis related to fecal or urinary stoma or fistula: Secondary | ICD-10-CM

## 2022-07-05 DIAGNOSIS — K5901 Slow transit constipation: Secondary | ICD-10-CM | POA: Diagnosis not present

## 2022-07-05 DIAGNOSIS — K9400 Colostomy complication, unspecified: Secondary | ICD-10-CM

## 2022-07-05 DIAGNOSIS — Z933 Colostomy status: Secondary | ICD-10-CM | POA: Diagnosis not present

## 2022-07-05 NOTE — Discharge Instructions (Signed)
Switch to flat barrier ring from convex Stoma powder and skin prep to red skin, weeping skin Added adapt lubricating deodorant WIll update [employer]

## 2022-07-05 NOTE — Progress Notes (Signed)
[facility]   Reason for visit:  LMQ colostomy HPI:  Diverticulitis with rupture, end colostomy Past Medical History:  Diagnosis Date   Achilles bursitis or tendinitis    Anxiety state, unspecified    Arthritis    Bilateral hydronephrosis 12/12/2021   Closed fracture of unspecified part of fibula    Depressive disorder, not elsewhere classified    Diverticulosis of colon (without mention of hemorrhage)    Edema    Fibromyalgia    Insomnia, unspecified    Intra-abdominal abscess (HCC) 06/21/2021   Lumbago    Mass of left ovary 06/01/2016   Meniere's disease 1986   MVP (mitral valve prolapse)    Other intra-abdominal and pelvic swelling, mass and lump 06/01/2016   Pain in joint, site unspecified    Peptic ulcer    no testing   Pure hypercholesterolemia    TSH elevation    Urinary tract infection 12/12/2021   Family History  Problem Relation Age of Onset   Hyperlipidemia Mother    Hypertension Mother    Colon polyps Mother    Coronary artery disease Father         AMI age 35   Heart disease Father 40       AMI x 2; first AMI in 95s   Breast cancer Sister 86       with metastasis to bone; smoker   Cancer Sister 93       breast cancer   Colon cancer Maternal Uncle        dx. >50   Colon cancer Paternal Aunt        dx. 80s   Heart attack Paternal Uncle    Stroke Maternal Grandmother    Alzheimer's disease Paternal Grandmother    Heart disease Paternal Grandfather    Heart attack Paternal Grandfather    Healthy Daughter    Healthy Daughter    Breast cancer Cousin    Ovarian cancer Cousin        dx. late 70s/dx. 60s   Lung cancer Cousin        dx. 70s   Stomach cancer Other    Esophageal cancer Neg Hx    Rectal cancer Neg Hx    Allergies  Allergen Reactions   Demerol [Meperidine] Nausea And Vomiting   Kenalog [Triamcinolone Acetonide] Other (See Comments)    Cause vertigo/dizziness   Adhesive [Tape] Rash and Other (See Comments)    PAPER  TAPE OK   Current Outpatient Medications  Medication Sig Dispense Refill Last Dose   acetaminophen (TYLENOL) 325 MG tablet Take 650 mg by mouth every 6 (six) hours as needed for moderate pain or headache.      buPROPion (WELLBUTRIN XL) 150 MG 24 hr tablet Take 2 tablets (300 mg total) by mouth daily. For depression. 180 tablet 3    busPIRone (BUSPAR) 5 MG tablet TAKE 1 TABLET (5 MG TOTAL) BY MOUTH 2 (TWO) TIMES DAILY. FOR ANXIETY 180 tablet 0    Cholecalciferol (VITAMIN D3 PO) Take 1 tablet by mouth daily. Unsure of mg      fluticasone (FLONASE) 50 MCG/ACT nasal spray Place 2 sprays into the nose daily as needed for allergies.       Multiple Vitamins-Minerals (MULTIVITAMIN WITH MINERALS) tablet Take 1 tablet by mouth daily. Centrum Silver for Women      Omega-3 Fatty Acids (FISH OIL) 500 MG CAPS Take 500 mg by mouth daily.      POTASSIUM PO Take 99  mg by mouth daily.      pravastatin (PRAVACHOL) 40 MG tablet TAKE 1 TABLET BY MOUTH EVERY DAY FOR CHOLESTEROL (Patient taking differently: Take 40 mg by mouth at bedtime.) 90 tablet 2    valACYclovir (VALTREX) 1000 MG tablet Take 1 tablet (1,000 mg total) by mouth 3 (three) times daily. 21 tablet 0    vitamin B-12 (CYANOCOBALAMIN) 1000 MCG tablet Take 1,000 mcg by mouth daily.      No current facility-administered medications for this encounter.   ROS  Review of Systems  Gastrointestinal:  Positive for constipation.       LMQ colostomy  Skin:  Positive for rash.       Peristomal irritation  Psychiatric/Behavioral: Negative.    All other systems reviewed and are negative.  Vital signs:  There were no vitals taken for this visit. Exam:  Physical Exam Constitutional:      Appearance: Normal appearance.  Abdominal:     Palpations: Abdomen is soft.     Hernia: A hernia is present.     Comments: Bulging around stoma suggestive of parastomal hernia  Skin:    General: Skin is warm and dry.     Findings: Rash present.  Neurological:      Mental Status: She is alert and oriented to person, place, and time.  Psychiatric:        Mood and Affect: Mood normal.        Behavior: Behavior normal.     Stoma type/location:  LMQ colostomy Stomal assessment/size:  1 1/2" pink and moist, flush Peristomal assessment:  rounded abdomen around stoma, using flat pouch and firm convex barrier ring.   Treatment options for stomal/peristomal skin: Barrier ring is rigid and painful.  Likely causing seal to pop off and stool to sit on skin ` Output: thick brown stool that is dry and does not drain well in to pouch. Has not been drinking a lot of water.   Recommend begin MIralax daily Ostomy pouching: 2pc.  With flat barrier ring.  Adding stoma powder and skin prep and lubricating deodorant to facilitate stool draining into pouch Education provided:  see back as needed.  Prescription sent to [facility] for new rings, powder skin prep and lubricant    Impression/dx  Constipation Colostomy Contact dermatitis Discussion  Update supplies with [facility] Plan  See back as needed.     Visit time: 45 minutes.   Maple Hudson FNP-BC

## 2022-07-06 ENCOUNTER — Telehealth: Payer: Self-pay | Admitting: Primary Care

## 2022-07-06 DIAGNOSIS — K9400 Colostomy complication, unspecified: Secondary | ICD-10-CM | POA: Insufficient documentation

## 2022-07-06 DIAGNOSIS — K5901 Slow transit constipation: Secondary | ICD-10-CM | POA: Insufficient documentation

## 2022-07-06 DIAGNOSIS — L24B3 Irritant contact dermatitis related to fecal or urinary stoma or fistula: Secondary | ICD-10-CM | POA: Insufficient documentation

## 2022-07-06 NOTE — Telephone Encounter (Signed)
Noted will look for forms to come to my [address].

## 2022-07-06 NOTE — Telephone Encounter (Signed)
Patient called in and stated that [facility] will be sending over a form that needs to be signed. She stated that she will be leaving to go on a 19 day trip and will need the supplies by May 9. Will be on the look out for the forms. Thank you!

## 2022-07-07 IMAGING — MR MR LUMBAR SPINE WO/W CM
5 of 8 series · 32 of 48 positions shown · IV contrast (gadavist)
Comparison: None.

CLINICAL DATA: Fall 1 year ago. Worsening back pain. Surgery in
3455. Low back pain, > 6 wks

EXAM:
MRI LUMBAR SPINE WITHOUT AND WITH CONTRAST
TECHNIQUE: Multiplanar and multiecho pulse sequences of the lumbar spine were
obtained without and with intravenous contrast.
CONTRAST:  6mL GADAVIST GADOBUTROL 1 MMOL/ML IV SOLN

[Series 2: T2 · sagittal · 4.0mm · 0.81mm/px · 3 of 15 slices shown (1 of 3)]
[im 1/15]
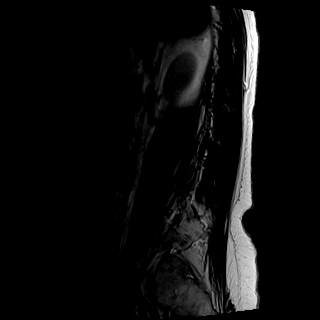
[im 8/15]
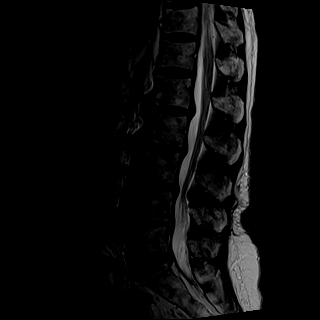
[im 15/15]
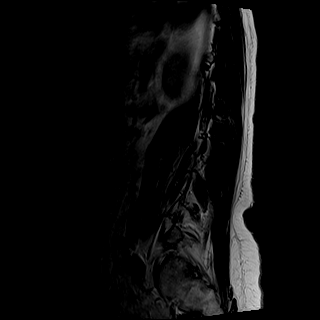

[Series 3: T1 · sagittal · 4.0mm · 0.41mm/px · 3 of 14 slices shown (1 of 2)]
[im 1/14]
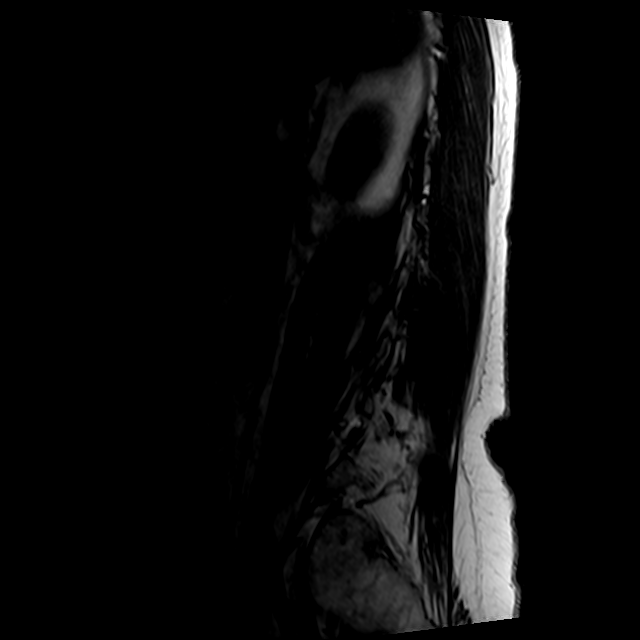
[im 7/14]
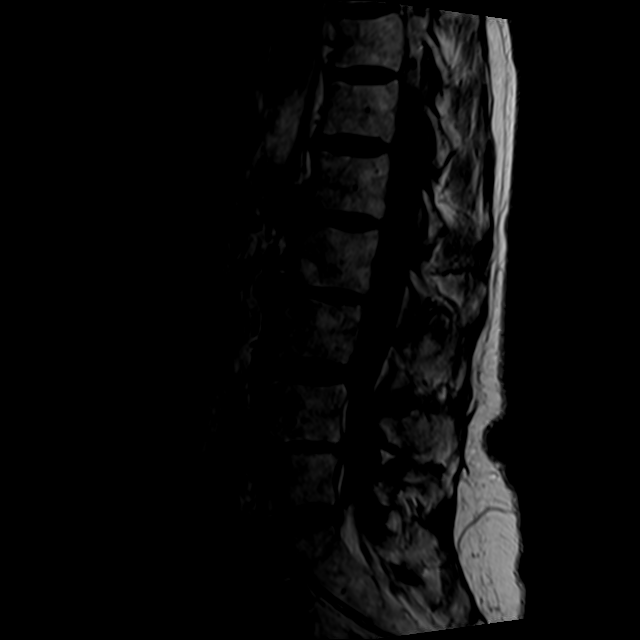
[im 14/14]
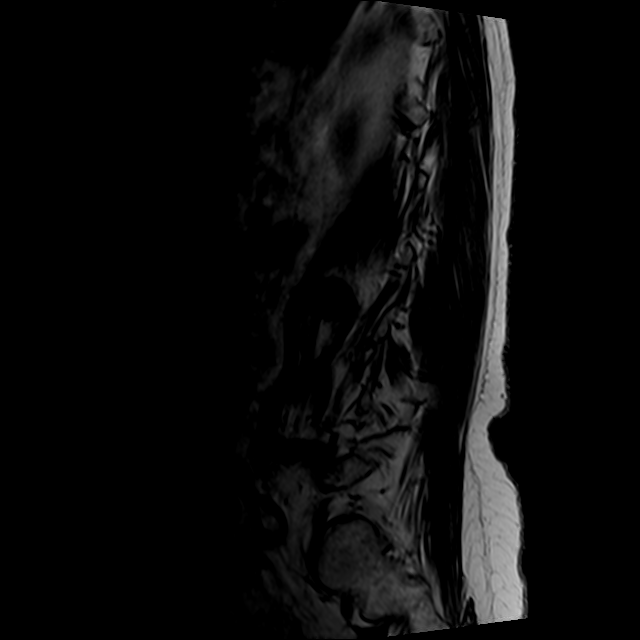

[Series 5: T2 · axial · 4.0mm · 0.78mm/px · z∈[-80,+119]mm · 9 of 38 slices shown (2 of 3)]
[im 1/38]
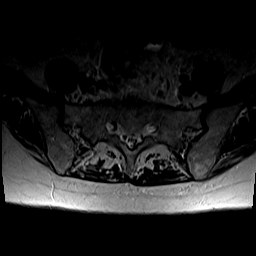
[im 5/38]
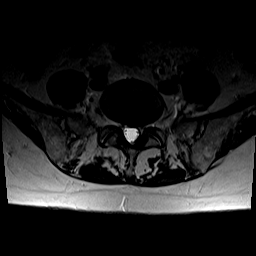
[im 10/38]
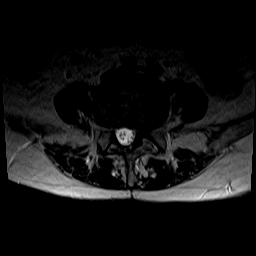
[im 14/38]
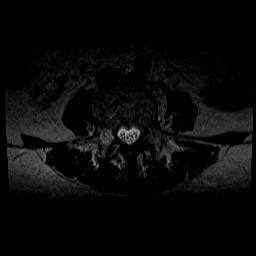
[im 19/38]
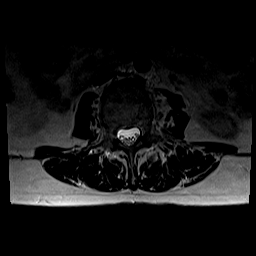
[im 24/38]
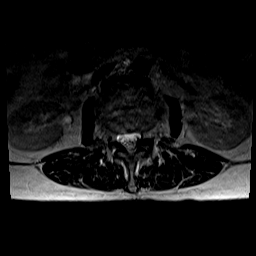
[im 28/38]
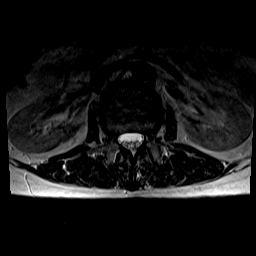
[im 33/38]
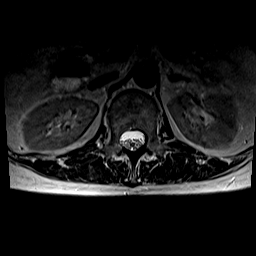
[im 38/38]
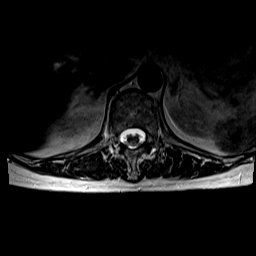

[Series 6: T1 · axial · 4.0mm · 0.39mm/px · z∈[-80,+119]mm · 8 of 38 slices shown (2 of 2)]
[im 1/38]
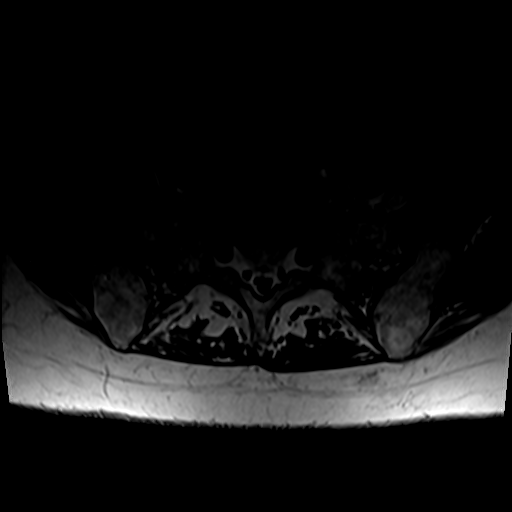
[im 5/38]
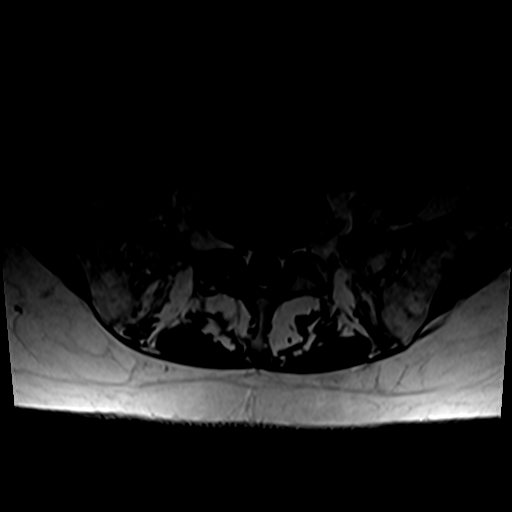
[im 10/38]
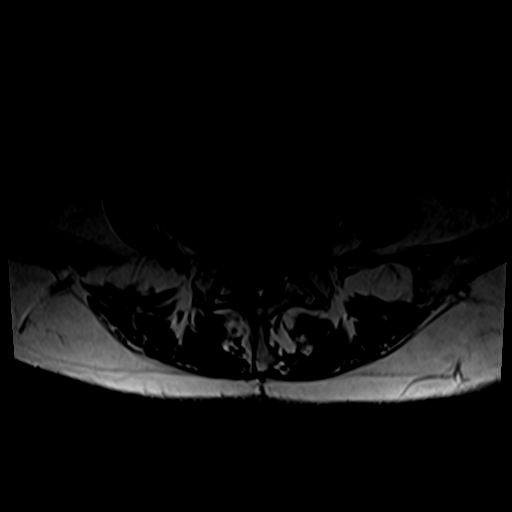
[im 14/38]
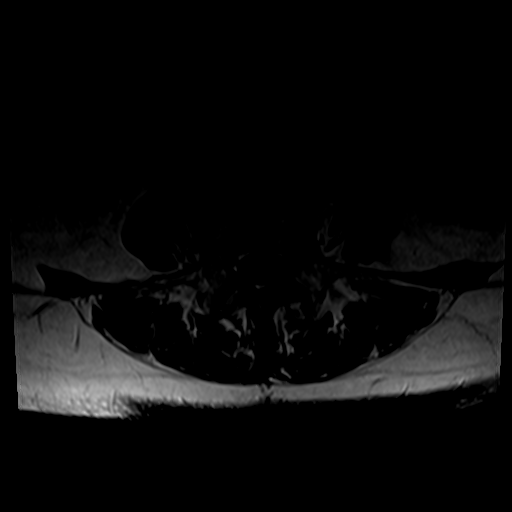
[im 24/38]
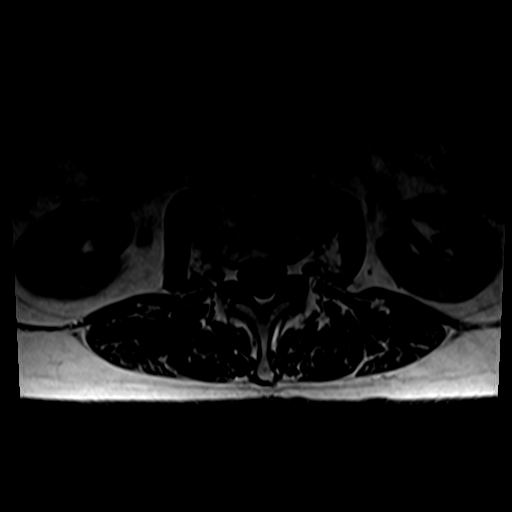
[im 28/38]
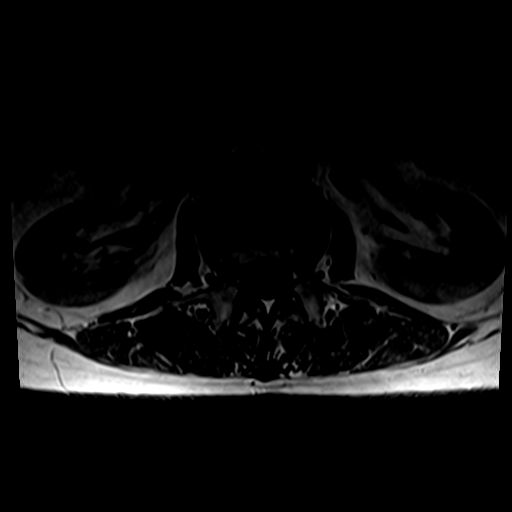
[im 33/38]
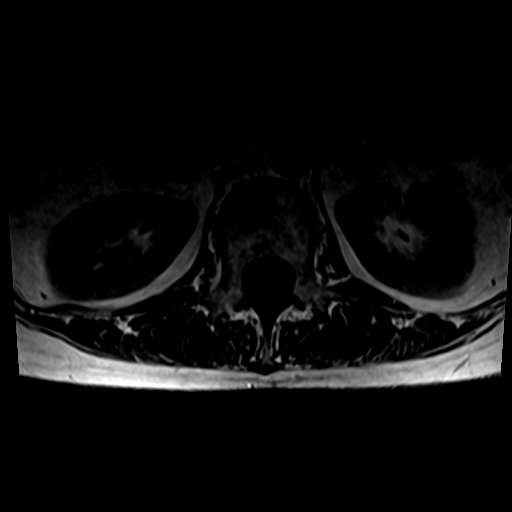
[im 38/38]
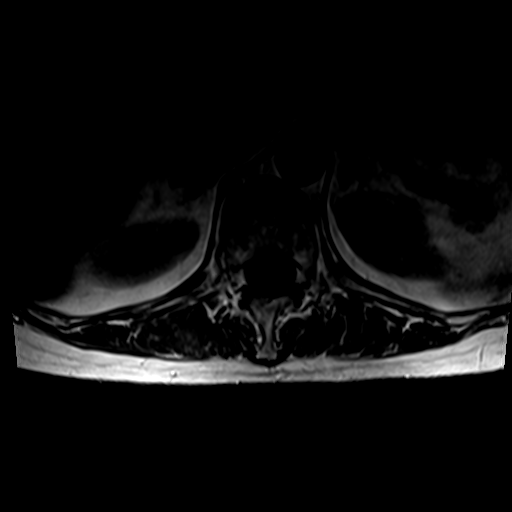

[Series 7: T2 · axial · 4.0mm · 0.78mm/px · z∈[-80,+119]mm · 9 of 38 slices shown (3 of 3)]
[im 1/38]
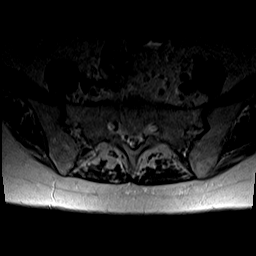
[im 5/38]
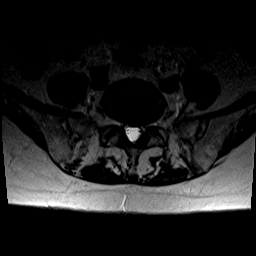
[im 10/38]
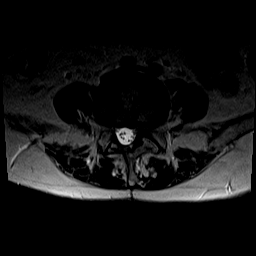
[im 14/38]
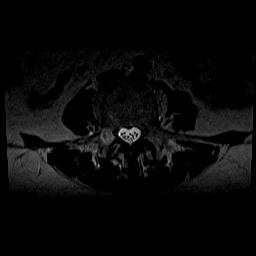
[im 19/38]
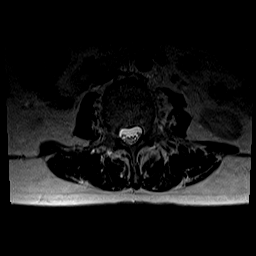
[im 24/38]
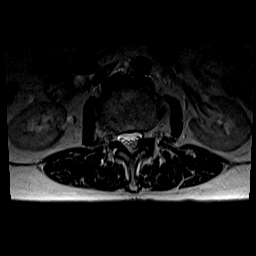
[im 28/38]
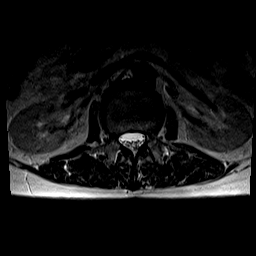
[im 33/38]
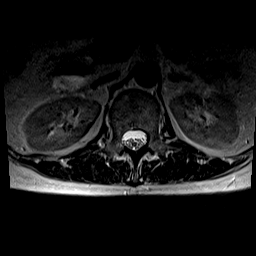
[im 38/38]
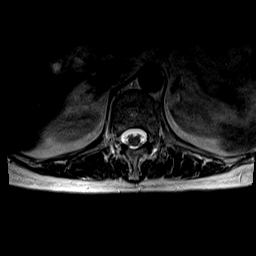

[32 of 48 positions shown; findings below may reference images not displayed]

FINDINGS: Segmentation:  Standard

Alignment:  Physiologic

Vertebrae:  No fracture, evidence of discitis, or bone lesion.

Conus medullaris and cauda equina: Conus extends to the L1 level.
Conus and cauda equina appear normal.

Paraspinal and other soft tissues: Negative

Disc levels:

L1-L2: Unchanged small central disc protrusion. No spinal canal
stenosis. No neural foraminal stenosis.

L2-L3: Unchanged disc height loss with small bulge. No spinal canal
stenosis. No neural foraminal stenosis.

L3-L4: Decreased size of central disc protrusion superimposed on
mild disc bulge. Improvement of mild spinal canal stenosis. Mild
facet hypertrophy without neural foraminal stenosis.

L4-L5: Progression of disc height loss. Previously seen right
subarticular protrusion is no longer present. Postsurgical changes.
New, slight left lateral recess narrowing without central spinal
canal stenosis. Worsened mild bilateral neural foraminal stenosis.

L5-S1: Small disc bulge, unchanged. Normal facets. No spinal canal
stenosis. No neural foraminal stenosis.

Visualized sacrum: Normal.
IMPRESSION: 1. Progression of disc height loss at L4-L5 with new, slight left
lateral recess narrowing and mild bilateral neural foraminal
stenosis.
2. Decreased size of central disc protrusion at L3-L4 with improved
mild spinal canal stenosis.

## 2022-07-13 DIAGNOSIS — M5116 Intervertebral disc disorders with radiculopathy, lumbar region: Secondary | ICD-10-CM | POA: Diagnosis not present

## 2022-07-13 DIAGNOSIS — M5416 Radiculopathy, lumbar region: Secondary | ICD-10-CM | POA: Diagnosis not present

## 2022-08-09 ENCOUNTER — Encounter: Payer: Self-pay | Admitting: Gastroenterology

## 2022-08-09 ENCOUNTER — Ambulatory Visit: Payer: Medicare Other | Admitting: Nurse Practitioner

## 2022-08-25 ENCOUNTER — Ambulatory Visit: Payer: Medicare Other | Admitting: Gastroenterology

## 2022-09-27 ENCOUNTER — Ambulatory Visit (AMBULATORY_SURGERY_CENTER): Payer: Medicare Other

## 2022-09-27 ENCOUNTER — Telehealth: Payer: Self-pay

## 2022-09-27 VITALS — Ht 60.0 in | Wt 109.0 lb

## 2022-09-27 DIAGNOSIS — K5792 Diverticulitis of intestine, part unspecified, without perforation or abscess without bleeding: Secondary | ICD-10-CM

## 2022-09-27 MED ORDER — NA SULFATE-K SULFATE-MG SULF 17.5-3.13-1.6 GM/177ML PO SOLN: 1.0000 | Freq: Once | ORAL | 0 refills | Status: AC

## 2022-09-27 NOTE — Telephone Encounter (Signed)
Dr. Lavon Paganini I am doing this patients PV and she is scheduled for a direct colonoscopy for evaluation of a colostomy reversal she is scheduled with you on 10/26/22 and the surgeons on 11/01/22. Per pt she is not having BM per rectum. Please advise if she will need a OV prior or if we are ok to proceed as scheduled.

## 2022-09-27 NOTE — Progress Notes (Signed)
No egg or soy allergy known to patient  No issues known to pt with past sedation with any surgeries or procedures Patient denies ever being told they had issues or difficulty with intubation  No FH of Malignant Hyperthermia Pt is not on diet pills Pt is not on  home 02  Pt is not on blood thinners  Pt denies issues with constipation  No A fib or A flutter Have any cardiac testing pending--no  LOA: independent  Prep: suprep   Patient's chart reviewed by John Nulty CNRA prior to previsit and patient appropriate for the [facility].  Previsit completed and red dot placed by patient's name on their procedure day (on provider's schedule).     PV competed with patient. Prep instructions sent via mychart and home address. Goodrx coupon for [facility] provided to use for price reduction if needed.   

## 2022-09-27 NOTE — Telephone Encounter (Signed)
It looks like she was suppose to have a procedure back in 2023 with Dr. Orvan Falconer and was ok to be scheduled at that point. Please review chart and advise.

## 2022-09-28 NOTE — Telephone Encounter (Signed)
Please instruct patient for all prior to and also advise her to do Fleet enema per rectum to clear the rectal pouch in the evening, 1 day prior to the procedure.Tracy Estrada   okay to schedule patient as direct if she meets criteria for [facility]. Thank you

## 2022-09-28 NOTE — Telephone Encounter (Signed)
Pt has been advised to do Fleet enema the night prior to procedure

## 2022-09-29 ENCOUNTER — Telehealth: Payer: Self-pay | Admitting: *Deleted

## 2022-09-29 NOTE — Telephone Encounter (Signed)
Spoke with pt and educated her on differences cost etc of each. Pt decided to go with the Saline Fleets enema. She states she has no other questions at this time.

## 2022-09-29 NOTE — Telephone Encounter (Signed)
Spoke with pt regarding her making choice between a saline enema AVS mineral base enema. Educated pt on the differences including cost and the outcomes for both. Pt states she will get the Fleets saline one. Pt states she did not have any other questions at this time.

## 2022-09-29 NOTE — Telephone Encounter (Addendum)
Patient called would like to know which Fleet enema to get.

## 2022-10-06 ENCOUNTER — Encounter (INDEPENDENT_AMBULATORY_CARE_PROVIDER_SITE_OTHER): Payer: Self-pay

## 2022-10-26 ENCOUNTER — Encounter: Payer: Self-pay | Admitting: Gastroenterology

## 2022-10-26 ENCOUNTER — Ambulatory Visit (AMBULATORY_SURGERY_CENTER): Payer: Medicare Other | Admitting: Gastroenterology

## 2022-10-26 VITALS — BP 127/77 | HR 83 | Temp 99.8°F | Resp 16 | Ht 60.0 in | Wt 109.0 lb

## 2022-10-26 DIAGNOSIS — D120 Benign neoplasm of cecum: Secondary | ICD-10-CM | POA: Diagnosis not present

## 2022-10-26 DIAGNOSIS — K5792 Diverticulitis of intestine, part unspecified, without perforation or abscess without bleeding: Secondary | ICD-10-CM

## 2022-10-26 MED ORDER — SODIUM CHLORIDE 0.9 % IV SOLN: 500.0000 mL | Freq: Once | INTRAVENOUS | Status: DC

## 2022-10-26 NOTE — Progress Notes (Unsigned)
Uneventful anesthetic. Report to pacu rn. Vss. Care resumed by rn. 

## 2022-10-26 NOTE — Patient Instructions (Signed)
YOU HAD AN ENDOSCOPIC PROCEDURE TODAY AT THE [facility]:   Refer to the procedure report that was given to you for any specific questions about what was found during the examination.  If the procedure report does not answer your questions, please call your gastroenterologist to clarify.  If you requested that your care partner not be given the details of your procedure findings, then the procedure report has been included in a sealed envelope for you to review at your convenience later.  YOU SHOULD EXPECT: Some feelings of bloating in the abdomen. Passage of more gas than usual.  Walking can help get rid of the air that was put into your GI tract during the procedure and reduce the bloating. If you had a lower endoscopy (such as a colonoscopy or flexible sigmoidoscopy) you may notice spotting of blood in your stool or on the toilet paper. If you underwent a bowel prep for your procedure, you may not have a normal bowel movement for a few days.  Please Note:  You might notice some irritation and congestion in your nose or some drainage.  This is from the oxygen used during your procedure.  There is no need for concern and it should clear up in a day or so.  SYMPTOMS TO REPORT IMMEDIATELY:  Following lower endoscopy (colonoscopy or flexible sigmoidoscopy):  Excessive amounts of blood in the stool  Significant tenderness or worsening of abdominal pains  Swelling of the abdomen that is new, acute  Fever of 100F or higher    For urgent or emergent issues, a gastroenterologist can be reached at any hour by calling [phone]. Do not use MyChart messaging for urgent concerns.    DIET:  We do recommend a small meal at first, but then you may proceed to your regular diet.  Drink plenty of fluids but you should avoid alcoholic beverages for 24 hours.  MEDICATIONS: Continue present medications.  FOLLOW UP: Await pathology results. Repeat colonoscopy date to be determined after pending  pathology results are reviewed for surveillance based on pathology results. Follow up with [facility] (DrMarland Kitchen Cliffton Asters) for ostomy reversal.  Please see handouts given to you by your recovery nurse: Polyps, Diverticulosis, Hemorrhoids.  Thank you for allowing [place] to provide for your healthcare needs today.  ACTIVITY:  You should plan to take it easy for the rest of today and you should NOT DRIVE or use heavy machinery until tomorrow (because of the sedation medicines used during the test).    FOLLOW UP: Our staff will call the number listed on your records the next business day following your procedure.  We will call around 7:15- 8:00 am to check on you and address any questions or concerns that you may have regarding the information given to you following your procedure. If we do not reach you, we will leave a message.     If any biopsies were taken you will be contacted by phone or by letter within the next 1-3 weeks.  Please call us at [phone] if you have not heard about the biopsies in 3 weeks.    SIGNATURES/CONFIDENTIALITY: You and/or your care partner have signed paperwork which will be entered into your electronic medical record.  These signatures attest to the fact that that the information above on your After Visit Summary has been reviewed and is understood.  Full responsibility of the confidentiality of this discharge information lies with you and/or your care-partner.

## 2022-10-26 NOTE — Progress Notes (Signed)
Pt's states no medical or surgical changes since previsit or office visit. 

## 2022-10-26 NOTE — Op Note (Signed)
[facility] Patient Name: Tracy Estrada Procedure Date: 10/26/2022 10:58 AM MRN: [id number] Endoscopist: Napoleon Form , MD, [id number] Age: 72 Referring MD:  Date of Birth: Dec 13, 1950 Gender: Female Account #: [id number] Procedure:                Colonoscopy Indications:              Follow-up of diverticulitis Medicines:                Monitored Anesthesia Care Procedure:                Pre-Anesthesia Assessment:                           - Prior to the procedure, a History and Physical                            was performed, and patient medications and                            allergies were reviewed. The patient's tolerance of                            previous anesthesia was also reviewed. The risks                            and benefits of the procedure and the sedation                            options and risks were discussed with the patient.                            All questions were answered, and informed consent                            was obtained. Prior Anticoagulants: The patient has                            taken no anticoagulant or antiplatelet agents. ASA                            Grade Assessment: II - A patient with mild systemic                            disease. After reviewing the risks and benefits,                            the patient was deemed in satisfactory condition to                            undergo the procedure.                           After obtaining informed consent, the colonoscope  was passed under direct vision. Throughout the                            procedure, the patient's blood pressure, pulse, and                            oxygen saturations were monitored continuously. The                            Olympus PCF-[id number] (#[id number]) Colonoscope was                            introduced through the anus and advanced to the the                            surgical stoma. The  Olympus [id number]-[id number] (#[id number])                            Colonoscope was introduced through the descending                            colostomy and advanced to the the cecum, identified                            by appendiceal orifice and ileocecal valve. The                            colonoscopy was performed without difficulty. The                            patient tolerated the procedure well. The quality                            of the bowel preparation was good. The ileocecal                            valve, appendiceal orifice, and rectum were                            photographed. Scope In: 11:20:30 AM Scope Out: 11:32:35 AM Scope Withdrawal Time: 0 hours 9 minutes 6 seconds  Total Procedure Duration: 0 hours 12 minutes 5 seconds  Findings:                 The perianal and digital rectal examinations were                            normal.                           Non-bleeding external and internal hemorrhoids were                            found during retroflexion. The hemorrhoids were  small.                           There was evidence of a patent end colostomy in the                            descending colon. This was characterized by healthy                            appearing mucosa.                           A 1 mm polyp was found in the cecum. The polyp was                            sessile. The polyp was removed with a cold biopsy                            forceps. Resection and retrieval were complete.                           Scattered large-mouthed, medium-mouthed and                            small-mouthed diverticula were found in the sigmoid                            colon, descending colon, transverse colon and                            ascending colon.                           The exam was otherwise without abnormality. Complications:            No immediate complications. Estimated Blood Loss:     Estimated blood  loss was minimal. Impression:               - Non-bleeding external and internal hemorrhoids.                           - Patent end colostomy with healthy appearing                            mucosa in the descending colon.                           - One 1 mm polyp in the cecum, removed with a cold                            biopsy forceps. Resected and retrieved.                           - Diverticulosis in the sigmoid colon, in the  descending colon, in the transverse colon and in                            the ascending colon.                           - The examination was otherwise normal. Recommendation:           - Patient has a contact number available for                            emergencies. The signs and symptoms of potential                            delayed complications were discussed with the                            patient. Return to normal activities tomorrow.                            Written discharge instructions were provided to the                            patient.                           - Resume previous diet.                           - Continue present medications.                           - Await pathology results.                           - Repeat colonoscopy date to be determined after                            pending pathology results are reviewed for                            surveillance based on pathology results.                           - Follow up with [facility] (Dr Cliffton Asters) for ostomy reversal Napoleon Form, MD 10/26/2022 11:40:18 AM This report has been signed electronically.

## 2022-10-26 NOTE — Progress Notes (Unsigned)
Called to room to assist during endoscopic procedure.  Patient ID and intended procedure confirmed with present staff. Received instructions for my participation in the procedure from the performing physician.  

## 2022-10-26 NOTE — Progress Notes (Unsigned)
[facility] History and Physical   Primary Care Physician:  Doreene Nest, NP   Reason for Procedure:  H/o complicated acute diverticulitis s/p colostomy  Plan:    colonoscopy with possible interventions as needed     HPI: Tracy Estrada is a very pleasant 72 y.o. female here for colonoscopy prior to reversal surgery , has h/o complicated acute diverticulitis s/p colostomy. Last colonoscopy in 2017 by Dr.Medoff  The risks and benefits as well as alternatives of endoscopic procedure(s) have been discussed and reviewed. All questions answered. The patient agrees to proceed.    Past Medical History:  Diagnosis Date   Achilles bursitis or tendinitis    Anxiety state, unspecified    Arthritis    Bilateral hydronephrosis 12/12/2021   Closed fracture of unspecified part of fibula    Depressive disorder, not elsewhere classified    Diverticulosis of colon (without mention of hemorrhage)    Edema    Fibromyalgia    Insomnia, unspecified    Intra-abdominal abscess (HCC) 06/21/2021   Lumbago    Mass of left ovary 06/01/2016   Meniere's disease 1986   MVP (mitral valve prolapse)    Other intra-abdominal and pelvic swelling, mass and lump 06/01/2016   Pain in joint, site unspecified    Peptic ulcer    no testing   Pure hypercholesterolemia    TSH elevation    Urinary tract infection 12/12/2021    Past Surgical History:  Procedure Laterality Date   ABDOMINAL HYSTERECTOMY  03/08/1980   fibroids; ovaries intact.   BACK SURGERY     x2 Cervical and lumbar   BOWEL RESECTION N/A 12/22/2021   Procedure: SIGMOID COLECTOMY AND COLOSTOMY (HARTMAN'S PROCEDURE);  Surgeon: Abigail Miyamoto, MD;  Location: [facility];  Service: General;  Laterality: N/A;   COLONOSCOPY  2017   CYSTOSCOPY N/A 06/08/2016   Procedure: Bluford Kaufmann;  Surgeon: Conard Novak, MD;  Location: [facility] ORS;  Service: Gynecology;  Laterality: N/A;   IR CATHETER TUBE CHANGE  10/21/2021   IR CATHETER TUBE  CHANGE  12/15/2021   IR RADIOLOGIST EVAL & MGMT  07/10/2021   IR RADIOLOGIST EVAL & MGMT  08/11/2021   IR RADIOLOGIST EVAL & MGMT  09/09/2021   IR RADIOLOGIST EVAL & MGMT  10/09/2021   IR RADIOLOGIST EVAL & MGMT  11/02/2021   IR RADIOLOGIST EVAL & MGMT  11/24/2021   LAPAROSCOPIC LYSIS OF ADHESIONS  06/08/2016   Procedure: LAPAROSCOPIC LYSIS OF ADHESIONS;  Surgeon: Conard Novak, MD;  Location: [facility] ORS;  Service: Gynecology;;   LAPAROSCOPIC SALPINGO OOPHERECTOMY Left 06/08/2016   Procedure: LAPAROSCOPIC SALPINGO OOPHORECTOMY;  Surgeon: Conard Novak, MD;  Location: [facility] ORS;  Service: Gynecology;  Laterality: Left;   OOPHORECTOMY     left ovary   SALPINGECTOMY     left   SPINE SURGERY     lumbar spine 11/2015; cervical spine 2009. [place].    Prior to Admission medications   Medication Sig Start Date End Date Taking? Authorizing Provider  acetaminophen (TYLENOL) 325 MG tablet Take 650 mg by mouth every 6 (six) hours as needed for moderate pain or headache.    [provider]  buPROPion (WELLBUTRIN XL) 150 MG 24 hr tablet Take 2 tablets (300 mg total) by mouth daily. For depression. 03/24/21   Doreene Nest, NP  Cholecalciferol (VITAMIN D3 PO) Take 1 tablet by mouth daily. Unsure of mg    [provider]  fluticasone (FLONASE) 50 MCG/ACT nasal spray Place 2 sprays into  the nose daily as needed for allergies.     [provider]  ibuprofen (ADVIL) 200 MG tablet Take 200 mg by mouth. Patient not taking: Reported on 09/27/2022    [provider]  Multiple Vitamins-Minerals (MULTIVITAMIN WITH MINERALS) tablet Take 1 tablet by mouth daily. Centrum Silver for Women    [provider]  POTASSIUM PO Take 99 mg by mouth daily.    [provider]  pravastatin (PRAVACHOL) 40 MG tablet TAKE 1 TABLET BY MOUTH EVERY DAY FOR CHOLESTEROL Patient taking differently: Take 40 mg by mouth at bedtime. 05/17/21   Doreene Nest, NP  vitamin  B-12 (CYANOCOBALAMIN) 1000 MCG tablet Take 1,000 mcg by mouth daily.    [provider]    Current Outpatient Medications  Medication Sig Dispense Refill   acetaminophen (TYLENOL) 325 MG tablet Take 650 mg by mouth every 6 (six) hours as needed for moderate pain or headache.     buPROPion (WELLBUTRIN XL) 150 MG 24 hr tablet Take 2 tablets (300 mg total) by mouth daily. For depression. 180 tablet 3   Cholecalciferol (VITAMIN D3 PO) Take 1 tablet by mouth daily. Unsure of mg     fluticasone (FLONASE) 50 MCG/ACT nasal spray Place 2 sprays into the nose daily as needed for allergies.      ibuprofen (ADVIL) 200 MG tablet Take 200 mg by mouth. (Patient not taking: Reported on 09/27/2022)     Multiple Vitamins-Minerals (MULTIVITAMIN WITH MINERALS) tablet Take 1 tablet by mouth daily. Centrum Silver for Women     POTASSIUM PO Take 99 mg by mouth daily.     pravastatin (PRAVACHOL) 40 MG tablet TAKE 1 TABLET BY MOUTH EVERY DAY FOR CHOLESTEROL (Patient taking differently: Take 40 mg by mouth at bedtime.) 90 tablet 2   vitamin B-12 (CYANOCOBALAMIN) 1000 MCG tablet Take 1,000 mcg by mouth daily.     Current Facility-Administered Medications  Medication Dose Route Frequency Provider Last Rate Last Admin   0.9 %  sodium chloride infusion  500 mL Intravenous Once Napoleon Form, MD        Allergies as of 10/26/2022 - Review Complete 10/26/2022  Allergen Reaction Noted   Demerol [meperidine] Nausea And Vomiting 11/30/2011   Kenalog [triamcinolone acetonide] Other (See Comments) 05/31/2016   Adhesive [tape] Rash and Other (See Comments) 05/31/2016    Family History  Problem Relation Age of Onset   Hyperlipidemia Mother    Hypertension Mother    Colon polyps Mother    Coronary artery disease Father         AMI age 44   Heart disease Father 56       AMI x 2; first AMI in 58s   Breast cancer Sister 31       with metastasis to bone; smoker   Cancer Sister 37       breast cancer   Colon  cancer Maternal Uncle        dx. >50   Colon cancer Paternal Aunt        dx. 80s   Heart attack Paternal Uncle    Stroke Maternal Grandmother    Alzheimer's disease Paternal Grandmother    Heart disease Paternal Grandfather    Heart attack Paternal Grandfather    Healthy Daughter    Healthy Daughter    Breast cancer Cousin    Ovarian cancer Cousin        dx. late 70s/dx. 60s   Lung cancer Cousin  dx. 37s   Stomach cancer Other    Esophageal cancer Neg Hx    Rectal cancer Neg Hx     Social History   Socioeconomic History   Marital status: Married    Spouse name: Ija Lewy   Number of children: 2   Years of education: Not on file   Highest education level: Not on file  Occupational History   Occupation: retired    Comment: Diplomatic Services operational officer  Tobacco Use   Smoking status: Never    Passive exposure: Never   Smokeless tobacco: Never  Vaping Use   Vaping status: Never Used  Substance and Sexual Activity   Alcohol use: Not Currently    Comment: 2-3 beers per week or wine   Drug use: No   Sexual activity: Yes    Birth control/protection: Surgical  Other Topics Concern   Not on file  Social History Narrative   Marital status:  Married x 14 years; second marriage;happily married,husband has PTSD.     Children: 2 daughters Selena Batten, Tresa Endo); 7 grandchildren; 2 gg      Lives: with husband, dog/beagle      Employment: retired since 2013; Diplomatic Services operational officer      Tobacco: never      Alcohol: weekends; 1-2 glasses of wine per week or beer.      Drugs:  None      Exercise: none in 2018      Seatbelt: 100%      Guns: unloaded.     Caffeine not every day. 7 grandchildren and 2 GG. Organ donor NO.      ADLs: independent with ADLs; drives      Advanced Directives: YES; FULL CODE no prolonged measures.   Social Determinants of Health   Financial Resource Strain: Low Risk  (09/29/2021)   Overall Financial Resource Strain (CARDIA)    Difficulty of Paying Living Expenses: Not hard at all   Food Insecurity: No Food Insecurity (12/17/2021)   Hunger Vital Sign    Worried About Running Out of Food in the Last Year: Never true    Ran Out of Food in the Last Year: Never true  Transportation Needs: No Transportation Needs (12/17/2021)   PRAPARE - Administrator, Civil Service (Medical): No    Lack of Transportation (Non-Medical): No  Physical Activity: Insufficiently Active (09/29/2021)   Exercise Vital Sign    Days of Exercise per Week: 2 days    Minutes of Exercise per Session: 20 min  Stress: No Stress Concern Present (09/29/2021)   [employer] of Occupational Health - Occupational Stress Questionnaire    Feeling of Stress : Only a little  Social Connections: Moderately Integrated (09/29/2021)   Social Connection and Isolation Panel [NHANES]    Frequency of Communication with Friends and Family: More than three times a week    Frequency of Social Gatherings with Friends and Family: Once a week    Attends Religious Services: More than 4 times per year    Active Member of [employer] or Organizations: No    Attends Banker Meetings: Never    Marital Status: Married  Catering manager Violence: Not At Risk (12/17/2021)   Humiliation, Afraid, Rape, and Kick questionnaire    Fear of Current or Ex-Partner: No    Emotionally Abused: No    Physically Abused: No    Sexually Abused: No    Review of Systems:  All other review of systems negative except as mentioned in the HPI.  Physical  Exam: Vital signs in last 24 hours: BP 131/80   Pulse 85   Temp 99.8 F (37.7 C)   Ht 5' (1.524 m)   Wt 109 lb (49.4 kg)   SpO2 98%   BMI 21.29 kg/m  General:   Alert, NAD Lungs:  Clear .   Heart:  Regular rate and rhythm Abdomen:  Soft, nontender and nondistended. Neuro/Psych:  Alert and cooperative. Normal mood and affect. A and O x 3  Reviewed labs, radiology imaging, old records and pertinent past GI work up  Patient is appropriate for planned  procedure(s) and anesthesia in an ambulatory setting   K. Scherry Ran , MD [phone]

## 2022-10-27 ENCOUNTER — Telehealth: Payer: Self-pay | Admitting: *Deleted

## 2022-10-27 NOTE — Telephone Encounter (Signed)
Post procedure follow up call placed, no answer and left VM.  

## 2022-10-27 NOTE — Telephone Encounter (Signed)
Per Colonoscopy report patient needs to follow up with Dr Cliffton Asters for Ostomy reversal.   I called [facility] to schedule the patients appointment  and Tracy Estrada  said she already has been scheduled to see Dr Cliffton Asters. She aid for me to fax over her colonoscopy report   Faxed colonoscopy report to [phone]

## 2022-10-28 ENCOUNTER — Ambulatory Visit (INDEPENDENT_AMBULATORY_CARE_PROVIDER_SITE_OTHER): Payer: Medicare Other

## 2022-10-28 VITALS — Ht <= 58 in | Wt 110.0 lb

## 2022-10-28 DIAGNOSIS — Z0000 Encounter for general adult medical examination without abnormal findings: Secondary | ICD-10-CM

## 2022-10-28 DIAGNOSIS — Z1231 Encounter for screening mammogram for malignant neoplasm of breast: Secondary | ICD-10-CM | POA: Diagnosis not present

## 2022-10-28 NOTE — Patient Instructions (Addendum)
Tracy Estrada , Thank you for taking time to come for your Medicare Wellness Visit. I appreciate your ongoing commitment to your health goals. Please review the following plan we discussed and let me know if I can assist you in the future.   Referrals/Orders/Follow-Ups/Clinician Recommendations: Aim for 30 minutes of exercise or brisk walking, 6-8 glasses of water, and 5 servings of fruits and vegetables each day.   You have an order for:  []   2D Mammogram  [x]   3D Mammogram  []   Bone Density     Please call for appointment:  [facility]  [address] [place] [address] [phone]  [facility] [address] [place], [place] [address] [phone]  [facility] [address] Geanie Logan [place], [place] [address] [phone]    Make sure to wear two-piece clothing.  No lotions, powders, or deodorants the day of the appointment. Make sure to bring picture ID and insurance card.  Bring list of medications you are currently taking including any supplements.   Schedule your [place] screening mammogram through MyChart!   Log into your MyChart account.  Go to 'Visit' (or 'Appointments' if on mobile App) --> Schedule an Appointment  Under 'Select a Reason for Visit' choose the Mammogram Screening option.  Complete the pre-visit questions and select the time and place that best fits your schedule.    This is a list of the screening recommended for you and due dates:  Health Maintenance  Topic Date Due   DTaP/Tdap/Td vaccine (2 - Td or Tdap) 08/09/2021   Medicare Annual Wellness Visit  09/30/2022   Flu Shot  10/07/2022   Mammogram  01/26/2023*   Colon Cancer Screening  10/25/2032   Pneumonia Vaccine  Completed   DEXA scan (bone density measurement)  Completed   Hepatitis C Screening  Completed   Zoster (Shingles) Vaccine  Completed   HPV  Vaccine  Aged Out   COVID-19 Vaccine  Discontinued  *Topic was postponed. The date shown is not the original due date.    Advanced directives: (Copy Requested) Please bring a copy of your health care power of attorney and living will to the office to be added to your chart at your convenience.  Next Medicare Annual Wellness Visit scheduled for next year: Yes

## 2022-10-28 NOTE — Progress Notes (Signed)
Subjective:   Tracy Estrada is a 71 y.o. female who presents for Medicare Annual (Subsequent) preventive examination.  Visit Complete: Virtual  I connected with  Addison Lank on 10/28/22 by a audio enabled telemedicine application and verified that I am speaking with the correct person using two identifiers.  Patient Location: Home  Provider Location: Office/Clinic  I discussed the limitations of evaluation and management by telemedicine. The patient expressed understanding and agreed to proceed.  Vital Signs: Because this visit was a virtual/telehealth visit, some criteria may be missing or patient reported. Any vitals not documented were not able to be obtained and vitals that have been documented are patient reported.    Review of Systems      Cardiac Risk Factors include: advanced age (>96men, >83 women);sedentary lifestyle;dyslipidemia     Objective:    Today's Vitals   10/28/22 1427  Weight: 110 lb (49.9 kg)  Height: 4\' 10"  (1.473 m)   Body mass index is 22.99 kg/m.     10/28/2022    2:35 PM 12/12/2021    1:01 PM 12/11/2021    4:16 PM 09/29/2021   10:37 AM 06/21/2021    8:30 PM 05/01/2021    1:51 PM 04/03/2020    1:38 PM  Advanced Directives  Does Patient Have a Medical Advance Directive? Yes  No No Yes Yes Yes  Type of Estate agent of [place];Living will    Healthcare Power of [employer] of [place];Living will Living will;Healthcare Power of Attorney  Does patient want to make changes to medical advance directive?     No - Patient declined Yes (ED - Information included in AVS)   Copy of Healthcare Power of Attorney in Chart? No - copy requested    No - copy requested    Would patient like information on creating a medical advance directive?  No - Patient declined  No - Patient declined No - Patient declined Yes (ED - Information included in AVS)     Current Medications (verified) Outpatient Encounter Medications as of  10/28/2022  Medication Sig   acetaminophen (TYLENOL) 325 MG tablet Take 650 mg by mouth every 6 (six) hours as needed for moderate pain or headache.   buPROPion (WELLBUTRIN XL) 150 MG 24 hr tablet Take 2 tablets (300 mg total) by mouth daily. For depression.   Cholecalciferol (VITAMIN D3 PO) Take 1 tablet by mouth daily. Unsure of mg   fluticasone (FLONASE) 50 MCG/ACT nasal spray Place 2 sprays into the nose daily as needed for allergies.    ibuprofen (ADVIL) 200 MG tablet Take 200 mg by mouth.   Multiple Vitamins-Minerals (MULTIVITAMIN WITH MINERALS) tablet Take 1 tablet by mouth daily. Centrum Silver for Women   POTASSIUM PO Take 99 mg by mouth daily.   pravastatin (PRAVACHOL) 40 MG tablet TAKE 1 TABLET BY MOUTH EVERY DAY FOR CHOLESTEROL (Patient taking differently: Take 40 mg by mouth at bedtime.)   vitamin B-12 (CYANOCOBALAMIN) 1000 MCG tablet Take 1,000 mcg by mouth daily.   Facility-Administered Encounter Medications as of 10/28/2022  Medication   0.9 %  sodium chloride infusion    Allergies (verified) Demerol [meperidine], Kenalog [triamcinolone acetonide], and Adhesive [tape]   History: Past Medical History:  Diagnosis Date   Achilles bursitis or tendinitis    Anxiety state, unspecified    Arthritis    Bilateral hydronephrosis 12/12/2021   Closed fracture of unspecified part of fibula    Depressive disorder, not elsewhere classified    Diverticulosis  of colon (without mention of hemorrhage)    Edema    Fibromyalgia    Insomnia, unspecified    Intra-abdominal abscess (HCC) 06/21/2021   Lumbago    Mass of left ovary 06/01/2016   Meniere's disease 1986   MVP (mitral valve prolapse)    Other intra-abdominal and pelvic swelling, mass and lump 06/01/2016   Pain in joint, site unspecified    Peptic ulcer    no testing   Pure hypercholesterolemia    TSH elevation    Urinary tract infection 12/12/2021   Past Surgical History:  Procedure Laterality Date   ABDOMINAL  HYSTERECTOMY  03/08/1980   fibroids; ovaries intact.   BACK SURGERY     x2 Cervical and lumbar   BOWEL RESECTION N/A 12/22/2021   Procedure: SIGMOID COLECTOMY AND COLOSTOMY (HARTMAN'S PROCEDURE);  Surgeon: Abigail Miyamoto, MD;  Location: [facility];  Service: General;  Laterality: N/A;   COLONOSCOPY  2017   CYSTOSCOPY N/A 06/08/2016   Procedure: Bluford Kaufmann;  Surgeon: Conard Novak, MD;  Location: [facility] ORS;  Service: Gynecology;  Laterality: N/A;   IR CATHETER TUBE CHANGE  10/21/2021   IR CATHETER TUBE CHANGE  12/15/2021   IR RADIOLOGIST EVAL & MGMT  07/10/2021   IR RADIOLOGIST EVAL & MGMT  08/11/2021   IR RADIOLOGIST EVAL & MGMT  09/09/2021   IR RADIOLOGIST EVAL & MGMT  10/09/2021   IR RADIOLOGIST EVAL & MGMT  11/02/2021   IR RADIOLOGIST EVAL & MGMT  11/24/2021   LAPAROSCOPIC LYSIS OF ADHESIONS  06/08/2016   Procedure: LAPAROSCOPIC LYSIS OF ADHESIONS;  Surgeon: Conard Novak, MD;  Location: [facility] ORS;  Service: Gynecology;;   LAPAROSCOPIC SALPINGO OOPHERECTOMY Left 06/08/2016   Procedure: LAPAROSCOPIC SALPINGO OOPHORECTOMY;  Surgeon: Conard Novak, MD;  Location: [facility] ORS;  Service: Gynecology;  Laterality: Left;   OOPHORECTOMY     left ovary   SALPINGECTOMY     left   SPINE SURGERY     lumbar spine 11/2015; cervical spine 2009. Elsner.   Family History  Problem Relation Age of Onset   Hyperlipidemia Mother    Hypertension Mother    Colon polyps Mother    Coronary artery disease Father         AMI age 48   Heart disease Father 102       AMI x 2; first AMI in 73s   Breast cancer Sister 63       with metastasis to bone; smoker   Cancer Sister 37       breast cancer   Colon cancer Maternal Uncle        dx. >50   Colon cancer Paternal Aunt        dx. 80s   Heart attack Paternal Uncle    Stroke Maternal Grandmother    Alzheimer's disease Paternal Grandmother    Heart disease Paternal Grandfather    Heart attack Paternal Grandfather    Healthy Daughter    Healthy  Daughter    Breast cancer Cousin    Ovarian cancer Cousin        dx. late 70s/dx. 60s   Lung cancer Cousin        dx. 70s   Stomach cancer Other    Esophageal cancer Neg Hx    Rectal cancer Neg Hx    Social History   Socioeconomic History   Marital status: Married    Spouse name: Mansi Torson   Number of children: 2   Years of education: Not  on file   Highest education level: Not on file  Occupational History   Occupation: retired    Comment: Diplomatic Services operational officer  Tobacco Use   Smoking status: Never    Passive exposure: Never   Smokeless tobacco: Never  Vaping Use   Vaping status: Never Used  Substance and Sexual Activity   Alcohol use: Not Currently    Comment: 2-3 beers per week or wine   Drug use: No   Sexual activity: Yes    Birth control/protection: Surgical, Post-menopausal  Other Topics Concern   Not on file  Social History Narrative   Marital status:  Married x 14 years; second marriage;happily married,husband has PTSD.     Children: 2 daughters Selena Batten, Tresa Endo); 7 grandchildren; 2 gg      Lives: with husband, dog/beagle      Employment: retired since 2013; Diplomatic Services operational officer      Tobacco: never      Alcohol: weekends; 1-2 glasses of wine per week or beer.      Drugs:  None      Exercise: none in 2018      Seatbelt: 100%      Guns: unloaded.     Caffeine not every day. 7 grandchildren and 2 GG. Organ donor NO.      ADLs: independent with ADLs; drives      Advanced Directives: YES; FULL CODE no prolonged measures.   Social Determinants of Health   Financial Resource Strain: Low Risk  (10/28/2022)   Overall Financial Resource Strain (CARDIA)    Difficulty of Paying Living Expenses: Not hard at all  Food Insecurity: No Food Insecurity (10/28/2022)   Hunger Vital Sign    Worried About Running Out of Food in the Last Year: Never true    Ran Out of Food in the Last Year: Never true  Transportation Needs: No Transportation Needs (10/28/2022)   PRAPARE - Scientist, research (physical sciences) (Medical): No    Lack of Transportation (Non-Medical): No  Physical Activity: Insufficiently Active (10/28/2022)   Exercise Vital Sign    Days of Exercise per Week: 3 days    Minutes of Exercise per Session: 10 min  Stress: No Stress Concern Present (10/28/2022)   [employer] of Occupational Health - Occupational Stress Questionnaire    Feeling of Stress : Not at all  Social Connections: Moderately Integrated (10/28/2022)   Social Connection and Isolation Panel [NHANES]    Frequency of Communication with Friends and Family: More than three times a week    Frequency of Social Gatherings with Friends and Family: More than three times a week    Attends Religious Services: More than 4 times per year    Active Member of [employer] or Organizations: No    Attends Engineer, structural: Never    Marital Status: Married    Tobacco Counseling Counseling given: Not Answered   Clinical Intake:  Pre-visit preparation completed: Yes  Pain : No/denies pain     BMI - recorded: 22.99 Nutritional Status: BMI of 19-24  Normal Nutritional Risks: None Diabetes: No  How often do you need to have someone help you when you read instructions, pamphlets, or other written materials from your doctor or pharmacy?: 1 - Never  Interpreter Needed?: No  Information entered by :: C.Christelle Igoe LPN   Activities of Daily Living    10/28/2022    2:36 PM 12/12/2021   12:00 PM  In your present state of health, do you have any difficulty performing  the following activities:  Hearing? 1 1  Comment has bilateral aids   Vision? 1 1  Comment due to cataracts   Difficulty concentrating or making decisions? 1 1  Comment difficulty remembering since surgery in October   Walking or climbing stairs? 0 1  Dressing or bathing? 0 0  Doing errands, shopping? 0 0  Preparing Food and eating ? Y   Comment Husband assists   Using the Toilet? N   In the past six months, have you accidently leaked  urine? Y   Comment wears pad   Do you have problems with loss of bowel control? Y   Comment ostomy bag   Managing your Medications? N   Managing your Finances? N   Housekeeping or managing your Housekeeping? N     Patient Care Team: Doreene Nest, NP as PCP - General (Internal Medicine) Corky Downs, MD (Cardiology) Barnett Abu, MD (Neurosurgery)  Indicate any recent Medical Services you may have received from other than [facility] providers in the past year (date may be approximate).     Assessment:   This is a routine wellness examination for [place].  Hearing/Vision screen Hearing Screening - Comments:: Bilateral hearing aids Vision Screening - Comments:: Glasses - [facility] - Has cataracts had to cancel last visit due to illness but will call to reschedule.  Dietary issues and exercise activities discussed:     Goals Addressed             This Visit's Progress    Patient Stated       Get rid of ostomy bag and rebuild strength.       Depression Screen    10/28/2022    2:30 PM 09/29/2021   10:34 AM 03/24/2021   11:43 AM 01/23/2020    2:04 PM 01/25/2019   11:16 AM 12/13/2017    2:25 PM 04/07/2016   12:13 PM  PHQ 2/9 Scores  PHQ - 2 Score 0 0 0 0 0 0 0  PHQ- 9 Score  0 1 0       Fall Risk    10/28/2022    2:36 PM 09/29/2021   10:38 AM 03/24/2021   11:43 AM 01/28/2020   11:16 AM 01/23/2020    2:03 PM  Fall Risk   Falls in the past year? 0 0 0 1 1  Number falls in past yr: 0 0 0 0 0  Injury with Fall? 0 0 0 1 1  Comment     right shoulder hurting  Risk for fall due to : No Fall Risks No Fall Risks  Impaired balance/gait Medication side effect  Follow up Falls prevention discussed;Falls evaluation completed Falls evaluation completed   Falls evaluation completed;Falls prevention discussed    MEDICARE RISK AT HOME: Medicare Risk at Home Any stairs in or around the home?: Yes If so, are there any without handrails?: No Home free of loose throw rugs in  walkways, pet beds, electrical cords, etc?: Yes Adequate lighting in your home to reduce risk of falls?: Yes Life alert?: No Use of a cane, walker or w/c?: No Grab bars in the bathroom?: Yes Shower chair or bench in shower?: Yes Elevated toilet seat or a handicapped toilet?: Yes  TIMED UP AND GO:  Was the test performed?  No    Cognitive Function:    01/23/2020    2:05 PM  MMSE - Mini Mental State Exam  Orientation to time 5  Orientation to Place 5  Registration 3  Attention/ Calculation 5  Recall 3  Language- repeat 1        10/28/2022    2:39 PM 09/29/2021   10:39 AM  6CIT Screen  What Year? 0 points 0 points  What month? 0 points 0 points  What time? 0 points 0 points  Count back from 20 0 points 0 points  Months in reverse 0 points 0 points  Repeat phrase 0 points 0 points  Total Score 0 points 0 points    Immunizations Immunization History  Administered Date(s) Administered   Fluad Quad(high Dose 65+) 01/22/2019, 01/09/2020   Influenza,inj,Quad PF,6+ Mos 11/06/2014   Influenza-Unspecified 01/07/2016, 12/13/2016, 11/12/2020   PFIZER(Purple Top)SARS-COV-2 Vaccination 04/19/2019, 05/08/2019   Pneumococcal Conjugate-13 11/06/2014   Pneumococcal Polysaccharide-23 04/07/2016   Tdap 08/10/2011   Zoster Recombinant(Shingrix) 05/15/2018, 09/16/2018, 01/22/2019   Zoster, Live 09/06/2011    TDAP status: Due, Education has been provided regarding the importance of this vaccine. Advised may receive this vaccine at local pharmacy or Health Dept. Aware to provide a copy of the vaccination record if obtained from local pharmacy or Health Dept. Verbalized acceptance and understanding.  Flu Vaccine status: Due, Education has been provided regarding the importance of this vaccine. Advised may receive this vaccine at local pharmacy or Health Dept. Aware to provide a copy of the vaccination record if obtained from local pharmacy or Health Dept. Verbalized acceptance and  understanding.  Pneumococcal vaccine status: Up to date  Covid-19 vaccine status: Declined, Education has been provided regarding the importance of this vaccine but patient still declined. Advised may receive this vaccine at local pharmacy or Health Dept.or vaccine clinic. Aware to provide a copy of the vaccination record if obtained from local pharmacy or Health Dept. Verbalized acceptance and understanding.  Qualifies for Shingles Vaccine? Yes   Zostavax completed Yes   Shingrix Completed?: Yes  Screening Tests Health Maintenance  Topic Date Due   DTaP/Tdap/Td (2 - Td or Tdap) 08/09/2021   INFLUENZA VACCINE  10/07/2022   MAMMOGRAM  01/26/2023 (Originally 01/20/2022)   Medicare Annual Wellness (AWV)  10/28/2023   Colonoscopy  10/25/2032   Pneumonia Vaccine 36+ Years old  Completed   DEXA SCAN  Completed   Hepatitis C Screening  Completed   Zoster Vaccines- Shingrix  Completed   HPV VACCINES  Aged Out   COVID-19 Vaccine  Discontinued    Health Maintenance  Health Maintenance Due  Topic Date Due   DTaP/Tdap/Td (2 - Td or Tdap) 08/09/2021   INFLUENZA VACCINE  10/07/2022    Colorectal cancer screening: Type of screening: Colonoscopy. Completed 10/26/22. Repeat every 10 years  Mammogram status: Ordered 10/28/22. Pt provided with contact info and advised to call to schedule appt.   Bone Density status: Completed 03/25/21. Results reflect: Bone density results: OSTEOPOROSIS. Repeat every 2 years.  Lung Cancer Screening: (Low Dose CT Chest recommended if Age 31-80 years, 20 pack-year currently smoking OR have quit w/in 15years.) does not qualify.   Lung Cancer Screening Referral:    Additional Screening:  Hepatitis C Screening: does qualify; Completed 03/17/20  Vision Screening: Recommended annual ophthalmology exams for early detection of glaucoma and other disorders of the eye. Is the patient up to date with their annual eye exam?  No pt will call to schedule. Who is the  provider or what is the name of the office in which the patient attends annual eye exams? [facility] If pt is not established with a provider, would they like to be referred to  a provider to establish care? No .   Dental Screening: Recommended annual dental exams for proper oral hygiene   Community Resource Referral / Chronic Care Management: CRR required this visit?  No   CCM required this visit?  No     Plan:     I have personally reviewed and noted the following in the patient's chart:   Medical and social history Use of alcohol, tobacco or illicit drugs  Current medications and supplements including opioid prescriptions. Patient is not currently taking opioid prescriptions. Functional ability and status Nutritional status Physical activity Advanced directives List of other physicians Hospitalizations, surgeries, and ER visits in previous 12 months Vitals Screenings to include cognitive, depression, and falls Referrals and appointments  In addition, I have reviewed and discussed with patient certain preventive protocols, quality metrics, and best practice recommendations. A written personalized care plan for preventive services as well as general preventive health recommendations were provided to patient.     Maryan Puls, LPN   04/21/863   After Visit Summary: (MyChart) Due to this being a telephonic visit, the after visit summary with patients personalized plan was offered to patient via MyChart   Nurse Notes: none

## 2022-11-01 DIAGNOSIS — Z933 Colostomy status: Secondary | ICD-10-CM | POA: Diagnosis not present

## 2022-11-01 DIAGNOSIS — K5720 Diverticulitis of large intestine with perforation and abscess without bleeding: Secondary | ICD-10-CM | POA: Diagnosis not present

## 2022-11-03 ENCOUNTER — Other Ambulatory Visit: Payer: Self-pay | Admitting: Urology

## 2022-11-09 ENCOUNTER — Ambulatory Visit (INDEPENDENT_AMBULATORY_CARE_PROVIDER_SITE_OTHER): Payer: Medicare Other | Admitting: Family Medicine

## 2022-11-09 VITALS — BP 100/70 | HR 79 | Temp 99.8°F | Ht 60.0 in | Wt 110.5 lb

## 2022-11-09 DIAGNOSIS — N39498 Other specified urinary incontinence: Secondary | ICD-10-CM

## 2022-11-09 DIAGNOSIS — R32 Unspecified urinary incontinence: Secondary | ICD-10-CM | POA: Insufficient documentation

## 2022-11-09 LAB — POC URINALSYSI DIPSTICK (AUTOMATED)
Bilirubin, UA: NEGATIVE
Blood, UA: NEGATIVE
Glucose, UA: NEGATIVE
Ketones, UA: NEGATIVE
Leukocytes, UA: NEGATIVE
Nitrite, UA: POSITIVE
Protein, UA: NEGATIVE
Spec Grav, UA: 1.02 (ref 1.010–1.025)
Urobilinogen, UA: 0.2 U/dL
pH, UA: 6 (ref 5.0–8.0)

## 2022-11-09 LAB — POCT UA - MICROSCOPIC ONLY

## 2022-11-09 NOTE — Assessment & Plan Note (Signed)
Chronic, No clear sign of urinary tract infection per urinalysis, but given upcoming surgery will send urine for culture to verify.  Symptoms most likely secondary to overactive bladder.  Recommend holding off on treatment until following colostomy takedown given possible changes associated. Recommended avoiding caffeine, alcohol and bladder irritants.  Push water.

## 2022-11-09 NOTE — Patient Instructions (Signed)
Up with water intake. We will call with urine culture results.

## 2022-11-09 NOTE — Progress Notes (Signed)
Patient ID: Tracy Estrada, female    DOB: 01/21/1951, 72 y.o.   MRN: [id number]  This visit was conducted in person.  BP 100/70 (BP Location: Left Arm, Patient Position: Sitting, Cuff Size: Normal)   Pulse 79   Temp 99.8 F (37.7 C) (Temporal)   Ht 5' (1.524 m)   Wt 110 lb 8 oz (50.1 kg)   SpO2 98%   BMI 21.58 kg/m    CC:  Chief Complaint  Patient presents with   Urinary Incontinence    Subjective:   HPI: Tracy Estrada is a 72 y.o. female patient of Mayra Reel  presenting on 11/09/2022 for Urinary Incontinence  Chronic colostomy bag for history of diverticulitis/colectomy.  Plan colostomy takedown in October 2024. Reviewed recent office visit note from Dr. Cliffton Asters, general surgery She underwent urgent sigmoidectomy/colostomy 12/22/21 for complicated diverticulitis with Dr. Magnus Ivan. Had prior indwelling perc drain with abscess  Today she reports  ever since last year's hospitalization she has noted intermittent incontinence.. has to wear a pad.  Increase in frequency but drinking more liquids.  No dysuria, no blood in urine.  Mild urine odor change.  No abdominal pain.  Some low back pain... recent epidural on low back.     Relevant past medical, surgical, family and social history reviewed and updated as indicated. Interim medical history since our last visit reviewed. Allergies and medications reviewed and updated. Outpatient Medications Prior to Visit  Medication Sig Dispense Refill   acetaminophen (TYLENOL) 325 MG tablet Take 650 mg by mouth every 6 (six) hours as needed for moderate pain or headache.     buPROPion (WELLBUTRIN XL) 150 MG 24 hr tablet Take 2 tablets (300 mg total) by mouth daily. For depression. 180 tablet 3   Cholecalciferol (VITAMIN D3 PO) Take 1 tablet by mouth daily. Unsure of mg     fluticasone (FLONASE) 50 MCG/ACT nasal spray Place 2 sprays into the nose daily as needed for allergies.      ibuprofen (ADVIL) 200 MG tablet Take 200 mg by mouth.      Multiple Vitamins-Minerals (MULTIVITAMIN WITH MINERALS) tablet Take 1 tablet by mouth daily. Centrum Silver for Women     POTASSIUM PO Take 99 mg by mouth daily.     pravastatin (PRAVACHOL) 40 MG tablet TAKE 1 TABLET BY MOUTH EVERY DAY FOR CHOLESTEROL 90 tablet 2   vitamin B-12 (CYANOCOBALAMIN) 1000 MCG tablet Take 1,000 mcg by mouth daily.     Facility-Administered Medications Prior to Visit  Medication Dose Route Frequency Provider Last Rate Last Admin   0.9 %  sodium chloride infusion  500 mL Intravenous Once Nandigam, Kavitha V, MD         Per HPI unless specifically indicated in ROS section below Review of Systems  Constitutional:  Negative for chills and fever.  HENT:  Negative for congestion and ear pain.   Eyes:  Negative for pain and redness.  Respiratory:  Negative for cough and shortness of breath.   Cardiovascular:  Negative for chest pain, palpitations and leg swelling.  Gastrointestinal:  Negative for abdominal pain, blood in stool, constipation, diarrhea, nausea and vomiting.  Genitourinary:  Negative for dysuria.  Musculoskeletal:  Negative for myalgias.  Skin:  Negative for rash.  Neurological:  Negative for dizziness.  Psychiatric/Behavioral:  The patient is not nervous/anxious.    Objective:  BP 100/70 (BP Location: Left Arm, Patient Position: Sitting, Cuff Size: Normal)   Pulse 79   Temp 99.8 F (37.7 C) (Temporal)  Ht 5' (1.524 m)   Wt 110 lb 8 oz (50.1 kg)   SpO2 98%   BMI 21.58 kg/m   Wt Readings from Last 3 Encounters:  11/09/22 110 lb 8 oz (50.1 kg)  10/28/22 110 lb (49.9 kg)  10/26/22 109 lb (49.4 kg)      Physical Exam Constitutional:      General: She is not in acute distress.    Appearance: Normal appearance. She is well-developed. She is not ill-appearing or toxic-appearing.  HENT:     Head: Normocephalic.     Right Ear: Hearing, tympanic membrane, ear canal and external ear normal. Tympanic membrane is not erythematous, retracted or  bulging.     Left Ear: Hearing, tympanic membrane, ear canal and external ear normal. Tympanic membrane is not erythematous, retracted or bulging.     Nose: No mucosal edema or rhinorrhea.     Right Sinus: No maxillary sinus tenderness or frontal sinus tenderness.     Left Sinus: No maxillary sinus tenderness or frontal sinus tenderness.     Mouth/Throat:     Mouth: Oropharynx is clear and moist and mucous membranes are normal.     Pharynx: Uvula midline.  Eyes:     General: Lids are normal. Lids are everted, no foreign bodies appreciated.     Extraocular Movements: EOM normal.     Conjunctiva/sclera: Conjunctivae normal.     Pupils: Pupils are equal, round, and reactive to light.  Neck:     Thyroid: No thyroid mass or thyromegaly.     Vascular: No carotid bruit.     Trachea: Trachea normal.  Cardiovascular:     Rate and Rhythm: Normal rate and regular rhythm.     Pulses: Normal pulses.     Heart sounds: Normal heart sounds, S1 normal and S2 normal. No murmur heard.    No friction rub. No gallop.  Pulmonary:     Effort: Pulmonary effort is normal. No tachypnea or respiratory distress.     Breath sounds: Normal breath sounds. No decreased breath sounds, wheezing, rhonchi or rales.  Abdominal:     General: Bowel sounds are normal.     Palpations: Abdomen is soft.     Tenderness: There is no abdominal tenderness.  Musculoskeletal:     Cervical back: Normal range of motion and neck supple.  Skin:    General: Skin is warm, dry and intact.     Findings: No rash.  Neurological:     Mental Status: She is alert.  Psychiatric:        Mood and Affect: Mood is not anxious or depressed.        Speech: Speech normal.        Behavior: Behavior normal. Behavior is cooperative.        Thought Content: Thought content normal.        Cognition and Memory: Cognition and memory normal.        Judgment: Judgment normal.       Results for orders placed or performed in visit on 11/09/22  POCT  Urinalysis Dipstick (Automated)  Result Value Ref Range   Color, UA Yellow    Clarity, UA Clear    Glucose, UA Negative Negative   Bilirubin, UA Negative    Ketones, UA Negative    Spec Grav, UA 1.020 1.010 - 1.025   Blood, UA Negative    pH, UA 6.0 5.0 - 8.0   Protein, UA Negative Negative   Urobilinogen, UA 0.2 0.2 or  1.0 E.U./dL   Nitrite, UA Positive    Leukocytes, UA Negative Negative  POCT UA - Microscopic Only  Result Value Ref Range   WBC, Ur, HPF, POC occ 0 - 5   RBC, Urine, Miroscopic none 0 - 2   Bacteria, U Microscopic     Mucus, UA     Epithelial cells, urine per micros few    Crystals, Ur, HPF, POC     Casts, Ur, LPF, POC     Yeast, UA      Assessment and Plan  Other urinary incontinence Assessment & Plan: Chronic, No clear sign of urinary tract infection per urinalysis, but given upcoming surgery will send urine for culture to verify.  Symptoms most likely secondary to overactive bladder.  Recommend holding off on treatment until following colostomy takedown given possible changes associated. Recommended avoiding caffeine, alcohol and bladder irritants.  Push water.  Orders: -     POCT Urinalysis Dipstick (Automated) -     POCT UA - Microscopic Only -     Urine Culture    No follow-ups on file.   Kerby Nora, MD

## 2022-11-12 ENCOUNTER — Ambulatory Visit: Payer: Medicare Other | Admitting: Family Medicine

## 2022-11-12 ENCOUNTER — Telehealth: Payer: Self-pay | Admitting: Primary Care

## 2022-11-12 ENCOUNTER — Encounter: Payer: Self-pay | Admitting: Gastroenterology

## 2022-11-12 ENCOUNTER — Other Ambulatory Visit: Payer: Self-pay | Admitting: Family Medicine

## 2022-11-12 LAB — URINE CULTURE
MICRO NUMBER:: 15413310
SPECIMEN QUALITY:: ADEQUATE

## 2022-11-12 MED ORDER — CEPHALEXIN 500 MG PO CAPS: 500.0000 mg | ORAL_CAPSULE | Freq: Three times a day (TID) | ORAL | 0 refills | Status: DC

## 2022-11-12 NOTE — Telephone Encounter (Signed)
Patient would like keflex sent to  [id number] - [place], [place] - [address] Phone: [phone]  Fax: [phone]

## 2022-11-12 NOTE — Telephone Encounter (Signed)
Called patient with urine culture results and Dr. Daphine Deutscher comments.  [place].  [facility] as requested.

## 2022-11-12 NOTE — Telephone Encounter (Signed)
Patient called in and stated that her medication was sent to the wrong pharmacy. She stated that it should of been sent over to [id number] - [place], [place] - [address].

## 2022-11-15 ENCOUNTER — Ambulatory Visit: Payer: Medicare Other | Admitting: Family Medicine

## 2022-11-17 ENCOUNTER — Other Ambulatory Visit: Payer: Self-pay | Admitting: Urology

## 2022-12-15 NOTE — Progress Notes (Signed)
Surgery orders requested via Epic inbox. °

## 2022-12-20 ENCOUNTER — Ambulatory Visit: Payer: Self-pay | Admitting: Surgery

## 2022-12-20 DIAGNOSIS — Z01818 Encounter for other preprocedural examination: Secondary | ICD-10-CM

## 2022-12-21 NOTE — Patient Instructions (Signed)
DUE TO COVID-19 ONLY TWO VISITORS  (aged 74 and older)  ARE ALLOWED TO COME WITH YOU AND STAY IN THE WAITING ROOM ONLY DURING PRE OP AND PROCEDURE.   **NO VISITORS ARE ALLOWED IN THE SHORT STAY AREA OR RECOVERY ROOM!!**  IF YOU WILL BE ADMITTED INTO THE HOSPITAL YOU ARE ALLOWED ONLY FOUR SUPPORT PEOPLE DURING VISITATION HOURS ONLY (7 AM -8PM)   The support person(s) must pass our screening, gel in and out, and wear a mask at all times, including in the patient's room. Patients must also wear a mask when staff or their support person are in the room. Visitors GUEST BADGE MUST BE WORN VISIBLY  One adult visitor may remain with you overnight and MUST be in the room by 8 P.M.     Your procedure is scheduled on: 12/31/22 5:15 AM   Call this number if you have problems the morning of surgery [phone]   Clear liquids starting the day before surgery until: 4:30 AM DAY OF SURGERY. Drink plenty clear fluids the day before surgery.  Water Black Coffee (sugar ok, NO MILK/CREAM OR CREAMERS)  Tea (sugar ok, NO MILK/CREAM OR CREAMERS) regular and decaf                             Plain Jell-O (NO RED)                                           Fruit ices (not with fruit pulp, NO RED)                                     Popsicles (NO RED)                                                                  Juice: apple, WHITE grape, WHITE cranberry Sports drinks like Gatorade (NO RED)              Drink 2 Ensure/G2 drinks AT 10:00 PM the night before surgery.    The day of surgery:  Drink ONE (1) Pre-Surgery Clear Ensure at : 4:30 AM the morning of surgery. Drink in one sitting. Do not sip.  This drink was given to you during your hospital  pre-op appointment visit. Nothing else to drink after completing the  Pre-Surgery Clear Ensure or G2.          If you have questions, please contact your surgeon's office.  FOLLOW BOWEL PREP AND ANY ADDITIONAL PRE OP INSTRUCTIONS YOU RECEIVED FROM YOUR  SURGEON'S OFFICE!!!     Oral Hygiene is also important to reduce your risk of infection.                                    Remember - BRUSH YOUR TEETH THE MORNING OF SURGERY WITH YOUR REGULAR TOOTHPASTE  DENTURES WILL BE REMOVED PRIOR TO SURGERY PLEASE DO NOT APPLY "Poly grip" OR ADHESIVES!!!   Do NOT smoke  after Midnight   Take these medicines the morning of surgery with A SIP OF WATER: bupropion.Tylenol as needed.                              You may not have any metal on your body including hair pins, jewelry, and body piercing             Do not wear make-up, lotions, powders, perfumes/cologne, or deodorant  Do not wear nail polish including gel and S&S, artificial/acrylic nails, or any other type of covering on natural nails including finger and toenails. If you have artificial nails, gel coating, etc. that needs to be removed by a nail salon please have this removed prior to surgery or surgery may need to be canceled/ delayed if the surgeon/ anesthesia feels like they are unable to be safely monitored.   Do not shave  48 hours prior to surgery.    Do not bring valuables to the hospital.  IS NOT             RESPONSIBLE   FOR VALUABLES.   Contacts, glasses, or bridgework may not be worn into surgery.   Bring small overnight bag day of surgery.   DO NOT BRING YOUR HOME MEDICATIONS TO THE HOSPITAL. PHARMACY WILL DISPENSE MEDICATIONS LISTED ON YOUR MEDICATION LIST TO YOU DURING YOUR ADMISSION IN THE HOSPITAL!    Patients discharged on the day of surgery will not be allowed to drive home.  Someone NEEDS to stay with you for the first 24 hours after anesthesia.   Special Instructions: Bring a copy of your healthcare power of attorney and living will documents         the day of surgery if you haven't scanned them before.              Please read over the following fact sheets you were given: IF YOU HAVE QUESTIONS ABOUT YOUR PRE-OP INSTRUCTIONS PLEASE CALL [phone]     [facility] - Preparing for Surgery Before surgery, you can play an important role.  Because skin is not sterile, your skin needs to be as free of germs as possible.  You can reduce the number of germs on your skin by washing with CHG (chlorahexidine gluconate) soap before surgery.  CHG is an antiseptic cleaner which kills germs and bonds with the skin to continue killing germs even after washing. Please DO NOT use if you have an allergy to CHG or antibacterial soaps.  If your skin becomes reddened/irritated stop using the CHG and inform your nurse when you arrive at Short Stay. Do not shave (including legs and underarms) for at least 48 hours prior to the first CHG shower.  You may shave your face/neck. Please follow these instructions carefully:  1.  Shower with CHG Soap the night before surgery and the  morning of Surgery.  2.  If you choose to wash your hair, wash your hair first as usual with your  normal  shampoo.  3.  After you shampoo, rinse your hair and body thoroughly to remove the  shampoo.                           4.  Use CHG as you would any other liquid soap.  You can apply chg directly  to the skin and wash  Gently with a scrungie or clean washcloth.  5.  Apply the CHG Soap to your body ONLY FROM THE NECK DOWN.   Do not use on face/ open                           Wound or open sores. Avoid contact with eyes, ears mouth and genitals (private parts).                       Wash face,  Genitals (private parts) with your normal soap.             6.  Wash thoroughly, paying special attention to the area where your surgery  will be performed.  7.  Thoroughly rinse your body with warm water from the neck down.  8.  DO NOT shower/wash with your normal soap after using and rinsing off  the CHG Soap.                9.  Pat yourself dry with a clean towel.            10.  Wear clean pajamas.            11.  Place clean sheets on your bed the night of your first shower and  do not  sleep with pets. Day of Surgery : Do not apply any lotions/deodorants the morning of surgery.  Please wear clean clothes to the hospital/surgery center.  FAILURE TO FOLLOW THESE INSTRUCTIONS MAY RESULT IN THE CANCELLATION OF YOUR SURGERY PATIENT SIGNATURE_________________________________  NURSE SIGNATURE__________________________________  ________________________________________________________________________  Tracy Estrada  An incentive spirometer is a tool that can help keep your lungs clear and active. This tool measures how well you are filling your lungs with each breath. Taking long deep breaths may help reverse or decrease the chance of developing breathing (pulmonary) problems (especially infection) following: A long period of time when you are unable to move or be active. BEFORE THE PROCEDURE  If the spirometer includes an indicator to show your best effort, your nurse or respiratory therapist will set it to a desired goal. If possible, sit up straight or lean slightly forward. Try not to slouch. Hold the incentive spirometer in an upright position. INSTRUCTIONS FOR USE  Sit on the edge of your bed if possible, or sit up as far as you can in bed or on a chair. Hold the incentive spirometer in an upright position. Breathe out normally. Place the mouthpiece in your mouth and seal your lips tightly around it. Breathe in slowly and as deeply as possible, raising the piston or the ball toward the top of the column. Hold your breath for 3-5 seconds or for as long as possible. Allow the piston or ball to fall to the bottom of the column. Remove the mouthpiece from your mouth and breathe out normally. Rest for a few seconds and repeat Steps 1 through 7 at least 10 times every 1-2 hours when you are awake. Take your time and take a few normal breaths between deep breaths. The spirometer may include an indicator to show your best effort. Use the indicator as a goal to work  toward during each repetition. After each set of 10 deep breaths, practice coughing to be sure your lungs are clear. If you have an incision (the cut made at the time of surgery), support your incision when coughing by placing a pillow or rolled up towels firmly  against it. Once you are able to get out of bed, walk around indoors and cough well. You may stop using the incentive spirometer when instructed by your caregiver.  RISKS AND COMPLICATIONS Take your time so you do not get dizzy or light-headed. If you are in pain, you may need to take or ask for pain medication before doing incentive spirometry. It is harder to take a deep breath if you are having pain. AFTER USE Rest and breathe slowly and easily. It can be helpful to keep track of a log of your progress. Your caregiver can provide you with a simple table to help with this. If you are using the spirometer at home, follow these instructions: SEEK MEDICAL CARE IF:  You are having difficultly using the spirometer. You have trouble using the spirometer as often as instructed. Your pain medication is not giving enough relief while using the spirometer. You develop fever of 100.5 F (38.1 C) or higher. SEEK IMMEDIATE MEDICAL CARE IF:  You cough up bloody sputum that had not been present before. You develop fever of 102 F (38.9 C) or greater. You develop worsening pain at or near the incision site. MAKE SURE YOU:  Understand these instructions. Will watch your condition. Will get help right away if you are not doing well or get worse. Document Released: 07/05/2006 Document Revised: 05/17/2011 Document Reviewed: 09/05/2006 [employer] Patient Information 2014 [employer], [place].   ________________________________________________________________________ WHAT IS A BLOOD TRANSFUSION? Blood Transfusion Information  A transfusion is the replacement of blood or some of its parts. Blood is made up of multiple cells which provide different  functions. Red blood cells carry oxygen and are used for blood loss replacement. White blood cells fight against infection. Platelets control bleeding. Plasma helps clot blood. Other blood products are available for specialized needs, such as hemophilia or other clotting disorders. BEFORE THE TRANSFUSION  Who gives blood for transfusions?  Healthy volunteers who are fully evaluated to make sure their blood is safe. This is blood bank blood. Transfusion therapy is the safest it has ever been in the practice of medicine. Before blood is taken from a donor, a complete history is taken to make sure that person has no history of diseases nor engages in risky social behavior (examples are intravenous drug use or sexual activity with multiple partners). The donor's travel history is screened to minimize risk of transmitting infections, such as malaria. The donated blood is tested for signs of infectious diseases, such as HIV and hepatitis. The blood is then tested to be sure it is compatible with you in order to minimize the chance of a transfusion reaction. If you or a relative donates blood, this is often done in anticipation of surgery and is not appropriate for emergency situations. It takes many days to process the donated blood. RISKS AND COMPLICATIONS Although transfusion therapy is very safe and saves many lives, the main dangers of transfusion include:  Getting an infectious disease. Developing a transfusion reaction. This is an allergic reaction to something in the blood you were given. Every precaution is taken to prevent this. The decision to have a blood transfusion has been considered carefully by your caregiver before blood is given. Blood is not given unless the benefits outweigh the risks. AFTER THE TRANSFUSION Right after receiving a blood transfusion, you will usually feel much better and more energetic. This is especially true if your red blood cells have gotten low (anemic). The  transfusion raises the level of the red blood  cells which carry oxygen, and this usually causes an energy increase. The nurse administering the transfusion will monitor you carefully for complications. HOME CARE INSTRUCTIONS  No special instructions are needed after a transfusion. You may find your energy is better. Speak with your caregiver about any limitations on activity for underlying diseases you may have. SEEK MEDICAL CARE IF:  Your condition is not improving after your transfusion. You develop redness or irritation at the intravenous (IV) site. SEEK IMMEDIATE MEDICAL CARE IF:  Any of the following symptoms occur over the next 12 hours: Shaking chills. You have a temperature by mouth above 102 F (38.9 C), not controlled by medicine. Chest, back, or muscle pain. People around you feel you are not acting correctly or are confused. Shortness of breath or difficulty breathing. Dizziness and fainting. You get a rash or develop hives. You have a decrease in urine output. Your urine turns a dark color or changes to pink, red, or brown. Any of the following symptoms occur over the next 10 days: You have a temperature by mouth above 102 F (38.9 C), not controlled by medicine. Shortness of breath. Weakness after normal activity. The white part of the eye turns yellow (jaundice). You have a decrease in the amount of urine or are urinating less often. Your urine turns a dark color or changes to pink, red, or brown. Document Released: 02/20/2000 Document Revised: 05/17/2011 Document Reviewed: 10/09/2007 [facility] Patient Information 2014 [place], [place].  _______________________________________________________________________

## 2022-12-22 ENCOUNTER — Encounter (HOSPITAL_COMMUNITY): Payer: Self-pay

## 2022-12-22 ENCOUNTER — Encounter (HOSPITAL_COMMUNITY)
Admission: RE | Admit: 2022-12-22 | Discharge: 2022-12-22 | Disposition: A | Discharge status: Home / Self Care | Payer: Medicare Other | Source: Ambulatory Visit | Attending: Surgery

## 2022-12-22 ENCOUNTER — Other Ambulatory Visit: Payer: Self-pay

## 2022-12-22 VITALS — BP 149/97 | HR 87 | Temp 97.8°F | Ht 60.0 in | Wt 109.0 lb

## 2022-12-22 DIAGNOSIS — Z01818 Encounter for other preprocedural examination: Secondary | ICD-10-CM | POA: Insufficient documentation

## 2022-12-22 DIAGNOSIS — I2510 Atherosclerotic heart disease of native coronary artery without angina pectoris: Secondary | ICD-10-CM | POA: Insufficient documentation

## 2022-12-22 LAB — CBC WITH DIFFERENTIAL/PLATELET
Abs Immature Granulocytes: 0.02 10*3/uL (ref 0.00–0.07)
Basophils Absolute: 0 10*3/uL (ref 0.0–0.1)
Basophils Relative: 0 %
Eosinophils Absolute: 0 10*3/uL (ref 0.0–0.5)
Eosinophils Relative: 0 %
HCT: 40.9 % (ref 36.0–46.0)
Hemoglobin: 13.4 g/dL (ref 12.0–15.0)
Immature Granulocytes: 1 %
Lymphocytes Relative: 32 %
Lymphs Abs: 1.4 10*3/uL (ref 0.7–4.0)
MCH: 33.4 pg (ref 26.0–34.0)
MCHC: 32.8 g/dL (ref 30.0–36.0)
MCV: 102 fL — ABNORMAL HIGH (ref 80.0–100.0)
Monocytes Absolute: 0.2 10*3/uL (ref 0.1–1.0)
Monocytes Relative: 5 %
Neutro Abs: 2.7 10*3/uL (ref 1.7–7.7)
Neutrophils Relative %: 62 %
Platelets: 167 10*3/uL (ref 150–400)
RBC: 4.01 MIL/uL (ref 3.87–5.11)
RDW: 11.7 % (ref 11.5–15.5)
WBC: 4.4 10*3/uL (ref 4.0–10.5)
nRBC: 0 % (ref 0.0–0.2)

## 2022-12-22 LAB — COMPREHENSIVE METABOLIC PANEL
ALT: 18 U/L (ref 0–44)
AST: 19 U/L (ref 15–41)
Albumin: 4.4 g/dL (ref 3.5–5.0)
Alkaline Phosphatase: 45 U/L (ref 38–126)
Anion gap: 9 (ref 5–15)
BUN: 23 mg/dL (ref 8–23)
CO2: 30 mmol/L (ref 22–32)
Calcium: 10.7 mg/dL — ABNORMAL HIGH (ref 8.9–10.3)
Chloride: 104 mmol/L (ref 98–111)
Creatinine, Ser: 0.71 mg/dL (ref 0.44–1.00)
GFR, Estimated: >60 mL/min (ref >60)
Glucose, Bld: 139 mg/dL — ABNORMAL HIGH (ref 70–99)
Potassium: 3.9 mmol/L (ref 3.5–5.1)
Sodium: 143 mmol/L (ref 135–145)
Total Bilirubin: 0.6 mg/dL (ref 0.3–1.2)
Total Protein: 7.3 g/dL (ref 6.5–8.1)

## 2022-12-22 NOTE — Progress Notes (Signed)
For Short Stay: COVID SWAB appointment date:  Bowel Prep reminder:   For Anesthesia: PCP - Doreene Nest, NP  Cardiologist - N/A  Chest x-ray -  EKG - 12/22/22 Stress Test -  ECHO -  Cardiac Cath -  Pacemaker/ICD device last checked: Pacemaker orders received: Device Rep notified:  Spinal Cord Stimulator: N/A  Sleep Study - N/A CPAP -   Fasting Blood Sugar - N/A Checks Blood Sugar _____ times a day Date and result of last Hgb A1c-  Last dose of GLP1 agonist- N/A GLP1 instructions:   Last dose of SGLT-2 inhibitors- N/A SGLT-2 instructions:   Blood Thinner Instructions:N/A Aspirin Instructions: Last Dose:  Activity level: Can go up a flight of stairs and activities of daily living without stopping and without chest pain and/or shortness of breath   Able to exercise without chest pain and/or shortness of breath  Anesthesia review: Hx: Mitral Valve prolapse (as per pt's chart,Pt. Does not remember when she was diagnose,no cardiologist follow up) Patient denies shortness of breath, fever, cough and chest pain at PAT appointment   Patient verbalized understanding of instructions that were given to them at the PAT appointment. Patient was also instructed that they will need to review over the PAT instructions again at home before surgery.

## 2022-12-24 ENCOUNTER — Encounter (HOSPITAL_COMMUNITY): Payer: Self-pay

## 2022-12-24 NOTE — Plan of Care (Signed)
CHL Tonsillectomy/Adenoidectomy, Postoperative PEDS care plan entered in error.

## 2022-12-27 ENCOUNTER — Telehealth: Payer: Medicare Other | Admitting: Family Medicine

## 2022-12-29 ENCOUNTER — Other Ambulatory Visit (HOSPITAL_COMMUNITY)
Admission: RE | Admit: 2022-12-29 | Discharge: 2022-12-29 | Disposition: A | Discharge status: Home / Self Care | Payer: Medicare Other | Source: Ambulatory Visit | Attending: Family | Admitting: Family

## 2022-12-29 ENCOUNTER — Ambulatory Visit: Payer: Medicare Other | Admitting: Family

## 2022-12-29 ENCOUNTER — Encounter: Payer: Self-pay | Admitting: Family

## 2022-12-29 VITALS — BP 118/76 | HR 76 | Temp 98.0°F | Ht 60.0 in | Wt 109.0 lb

## 2022-12-29 DIAGNOSIS — N760 Acute vaginitis: Secondary | ICD-10-CM

## 2022-12-29 LAB — URINALYSIS, ROUTINE W REFLEX MICROSCOPIC
Bilirubin Urine: NEGATIVE
Ketones, ur: NEGATIVE
Nitrite: NEGATIVE
Specific Gravity, Urine: 1.025 (ref 1.000–1.030)
Total Protein, Urine: 30 — AB
Urine Glucose: NEGATIVE
Urobilinogen, UA: 0.2 (ref 0.0–1.0)
pH: 6.5 (ref 5.0–8.0)

## 2022-12-29 LAB — POCT URINALYSIS DIPSTICK
Bilirubin, UA: NEGATIVE
Glucose, UA: NEGATIVE
Ketones, UA: NEGATIVE
Nitrite, UA: NEGATIVE
Protein, UA: POSITIVE — AB
Spec Grav, UA: >=1.03 — AB (ref 1.010–1.025)
Urobilinogen, UA: 0.2 U/dL
pH, UA: 6 (ref 5.0–8.0)

## 2022-12-29 MED ORDER — FLUCONAZOLE 150 MG PO TABS
150.0000 mg | ORAL_TABLET | Freq: Once | ORAL | 0 refills | Status: AC
Start: 2022-12-29 — End: 2022-12-29

## 2022-12-29 NOTE — Assessment & Plan Note (Signed)
Presentation consistent with candidiasis.  We jointly agreed to start diflucan ahead of wet prep due to severity of symptoms.  Chronic care urine Patient has already reached out to Dr. Lucilla Lame office in regards to symptoms.  I have reiterated the importance of staying transparent with his office in regards to potential UTI ,yeast infection and the implications for takedown of colostomy.

## 2022-12-29 NOTE — Progress Notes (Signed)
Assessment & Plan:  Acute vaginitis Assessment & Plan: Presentation consistent with candidiasis.  We jointly agreed to start diflucan ahead of wet prep due to severity of symptoms.  Chronic care urine Patient has already reached out to Dr. Lucilla Lame office in regards to symptoms.  I have reiterated the importance of staying transparent with his office in regards to potential UTI ,yeast infection and the implications for takedown of colostomy.  Orders: -     Fluconazole; Take 1 tablet (150 mg total) by mouth once for 1 dose. Take one tablet PO once. If sxs persist, may take one tablet PO 3 days later.  Dispense: 2 tablet; Refill: 0 -     POCT urinalysis dipstick -     Urine Culture -     Urinalysis, Routine w reflex microscopic -     Cervicovaginal ancillary only     Return precautions given.   Risks, benefits, and alternatives of the medications and treatment plan prescribed today were discussed, and patient expressed understanding.   Education regarding symptom management and diagnosis given to patient on AVS either electronically or printed.  No follow-ups on file.  Rennie Plowman, FNP  Subjective:    Patient ID: Tracy Estrada, female    DOB: [birth year], 72 y.o.   MRN: [id number]  CC: Tracy Estrada is a 72 y.o. female who presents today for an acute visit.    HPI: Complains of dysuria  x 6 days, unchanged  Thick 'cottage cheese' discharge x 6 days.   She states Dr Lucilla Lame office is aware.   Denies fever, flank pain, hematuria.     Drinking plenty water.     She is continent of urine.   Planning colostomy takedown in 2 days.  Sister diverticulitis with perforation and abscess. No history of CKD History of abdominal hysterectomy, 1982.  oophorectomy of the left ovary  11/09/22 klebsiella , treated with keflex 7 days with complete resolution   Allergies: Demerol [meperidine], Kenalog [triamcinolone acetonide], and Adhesive [tape] Current Outpatient Medications on  File Prior to Visit  Medication Sig Dispense Refill   acetaminophen (TYLENOL) 325 MG tablet Take 650 mg by mouth every 6 (six) hours as needed for moderate pain or headache.     buPROPion (WELLBUTRIN XL) 150 MG 24 hr tablet Take 2 tablets (300 mg total) by mouth daily. For depression. 180 tablet 3   Cholecalciferol (VITAMIN D3 PO) Take 1 tablet by mouth daily.     diclofenac Sodium (VOLTAREN) 1 % GEL Apply 2 g topically 2 (two) times daily as needed (pain).     fluticasone (FLONASE) 50 MCG/ACT nasal spray Place 2 sprays into the nose daily as needed for allergies.      ibuprofen (ADVIL) 200 MG tablet Take 200-400 mg by mouth every 6 (six) hours as needed for moderate pain.     Multiple Vitamins-Minerals (MULTIVITAMIN WITH MINERALS) tablet Take 1 tablet by mouth daily. Centrum Silver for Women     Potassium 99 MG TABS Take 99 mg by mouth daily.     pravastatin (PRAVACHOL) 40 MG tablet TAKE 1 TABLET BY MOUTH EVERY DAY FOR CHOLESTEROL 90 tablet 2   vitamin B-12 (CYANOCOBALAMIN) 1000 MCG tablet Take 1,000 mcg by mouth daily.     cephALEXin (KEFLEX) 500 MG capsule Take 1 capsule (500 mg total) by mouth 3 (three) times daily. (Patient not taking: Reported on 12/09/2022) 21 capsule 0   No current facility-administered medications on file prior to visit.    Review of Systems  Constitutional:  Negative for chills and fever.  Respiratory:  Negative for cough.   Cardiovascular:  Negative for chest pain and palpitations.  Gastrointestinal:  Negative for nausea and vomiting.  Genitourinary:  Positive for dysuria, pelvic pain, vaginal discharge and vaginal pain. Negative for difficulty urinating and vaginal bleeding.      Objective:    BP 118/76   Pulse 76   Temp 98 F (36.7 C) (Oral)   Ht 5' (1.524 m)   Wt 109 lb (49.4 kg)   SpO2 98%   BMI 21.29 kg/m   BP Readings from Last 3 Encounters:  12/29/22 118/76  12/22/22 (!) 149/97  11/09/22 100/70   Wt Readings from Last 3 Encounters:   12/29/22 109 lb (49.4 kg)  12/22/22 109 lb (49.4 kg)  11/09/22 110 lb 8 oz (50.1 kg)    Physical Exam Vitals reviewed.  Constitutional:      Appearance: She is well-developed.  Eyes:     Conjunctiva/sclera: Conjunctivae normal.  Cardiovascular:     Rate and Rhythm: Normal rate and regular rhythm.     Pulses: Normal pulses.     Heart sounds: Normal heart sounds.  Pulmonary:     Effort: Pulmonary effort is normal.     Breath sounds: Normal breath sounds. No wheezing, rhonchi or rales.  Genitourinary:    Labia:        Right: No rash, tenderness or lesion.        Left: No rash, tenderness or lesion.      Vagina: No foreign body. No vaginal discharge, erythema, tenderness or bleeding.     Cervix: No cervical motion tenderness or discharge.     Adnexa:        Right: No mass, tenderness or fullness.         Left: No mass, tenderness or fullness.       Comments: Diffuse vulvovaginal erythema. No lesions. Discharge is thin and clear, not purulent.  Skin:    General: Skin is warm and dry.  Neurological:     Mental Status: She is alert.  Psychiatric:        Speech: Speech normal.        Behavior: Behavior normal.        Thought Content: Thought content normal.

## 2022-12-29 NOTE — Patient Instructions (Signed)
Start Diflucan which is an antifungal used to treat yeast infection.  Will await urine studies.  If Urine looks quite suspicious for urine tract infection, we will call you and send an antibiotic in for you.     Please ensure that you are communicating with your surgeon ahead of surgery on Friday to make them aware of potential urinary tract infection, yeast infection and any complications that may present for colonoscopy take down.

## 2022-12-30 ENCOUNTER — Telehealth: Payer: Self-pay | Admitting: Primary Care

## 2022-12-30 LAB — CERVICOVAGINAL ANCILLARY ONLY
Bacterial Vaginitis (gardnerella): NEGATIVE
Candida Glabrata: NEGATIVE
Candida Vaginitis: NEGATIVE
Comment: NEGATIVE
Comment: NEGATIVE
Comment: NEGATIVE

## 2022-12-30 NOTE — H&P (Signed)
CC: Here today for surgery  HPI: Tracy Estrada is an 72 y.o. female with history of allergies, fibromyalgia whom is seen in the office today by Dr. Magnus Ivan to consider colostomy reversal  She underwent urgent sigmoidectomy/colostomy 12/22/21 for complicated diverticulitis with Dr. Magnus Ivan. Had prior indwelling perc drain with abscess  Findings: The patient was found to have an extensive phlegmon in the pelvis involving the sigmoid colon with loops of small bowel fixated over the top of the colon. The small bowel was able to be freed off of the colon without the need for resection   PATH A. COLON, SIGMOID, COLECTOMY:  - Diverticulitis with perforation  - Margins appear viable  - Negative for malignancy   Colonoscopy Dr. Kinnie Scales 04/07/15: No neoplasia; +diverticulosis; internal hemorrhoids; repeat in 10 yrs  PCP is Dr. Roxy Manns  Colonoscopy with Dr. Lavon Paganini 10/26/2022: - Nonbleeding hemorrhoids - Patent colostomy - Single 1 mm polyp in cecum, removed - Diverticulosis in the sigmoid, descending, transverse, and ascending colon  PATH: TA  GGE 05/28/22 - The rectal pouch measures approximately 13 cm in length. No evidence of leak.  INTERVAL HX She denies any changes in health or health history since we met in the office - remains motivated to have colostomy reversed. No new medications/allergies. She states she is ready for surgery today.  PMH: allergies, fibromyalgia  PSH: Exlap/Hartmann's as above  Social Hx: Denies use of tobacco/EtOH/illicit drug.   Past Medical History:  Diagnosis Date   Achilles bursitis or tendinitis    Anxiety state, unspecified    Arthritis    Bilateral hydronephrosis 12/12/2021   Closed fracture of unspecified part of fibula    Depressive disorder, not elsewhere classified    Diverticulosis of colon (without mention of hemorrhage)    Edema    Fibromyalgia    Insomnia, unspecified    Intra-abdominal abscess (HCC) 06/21/2021   Lumbago     Mass of left ovary 06/01/2016   Meniere's disease 1986   MVP (mitral valve prolapse)    Other intra-abdominal and pelvic swelling, mass and lump 06/01/2016   Pain in joint, site unspecified    Peptic ulcer    no testing   Pure hypercholesterolemia    TSH elevation    Urinary tract infection 12/12/2021    Past Surgical History:  Procedure Laterality Date   ABDOMINAL HYSTERECTOMY  03/08/1980   fibroids; ovaries intact.   BACK SURGERY     x2 Cervical and lumbar   BOWEL RESECTION N/A 12/22/2021   Procedure: SIGMOID COLECTOMY AND COLOSTOMY (HARTMAN'S PROCEDURE);  Surgeon: Abigail Miyamoto, MD;  Location: [facility];  Service: General;  Laterality: N/A;   COLONOSCOPY  2017   CYSTOSCOPY N/A 06/08/2016   Procedure: Bluford Kaufmann;  Surgeon: Conard Novak, MD;  Location: [facility] ORS;  Service: Gynecology;  Laterality: N/A;   IR CATHETER TUBE CHANGE  10/21/2021   IR CATHETER TUBE CHANGE  12/15/2021   IR RADIOLOGIST EVAL & MGMT  07/10/2021   IR RADIOLOGIST EVAL & MGMT  08/11/2021   IR RADIOLOGIST EVAL & MGMT  09/09/2021   IR RADIOLOGIST EVAL & MGMT  10/09/2021   IR RADIOLOGIST EVAL & MGMT  11/02/2021   IR RADIOLOGIST EVAL & MGMT  11/24/2021   LAPAROSCOPIC LYSIS OF ADHESIONS  06/08/2016   Procedure: LAPAROSCOPIC LYSIS OF ADHESIONS;  Surgeon: Conard Novak, MD;  Location: [facility] ORS;  Service: Gynecology;;   LAPAROSCOPIC SALPINGO OOPHERECTOMY Left 06/08/2016   Procedure: LAPAROSCOPIC SALPINGO OOPHORECTOMY;  Surgeon: Conard Novak,  MD;  Location: [facility] ORS;  Service: Gynecology;  Laterality: Left;   OOPHORECTOMY     left ovary   SALPINGECTOMY     left   SPINE SURGERY     lumbar spine 11/2015; cervical spine 2009. Elsner.   TONSILLECTOMY      Family History  Problem Relation Age of Onset   Hyperlipidemia Mother    Hypertension Mother    Colon polyps Mother    Coronary artery disease Father         AMI age 36   Heart disease Father 53       AMI x 2; first AMI in 47s   Breast cancer  Sister 79       with metastasis to bone; smoker   Cancer Sister 7       breast cancer   Colon cancer Maternal Uncle        dx. >50   Colon cancer Paternal Aunt        dx. 80s   Heart attack Paternal Uncle    Stroke Maternal Grandmother    Alzheimer's disease Paternal Grandmother    Heart disease Paternal Grandfather    Heart attack Paternal Grandfather    Healthy Daughter    Healthy Daughter    Breast cancer Cousin    Ovarian cancer Cousin        dx. late 70s/dx. 60s   Lung cancer Cousin        dx. 70s   Stomach cancer Other    Esophageal cancer Neg Hx    Rectal cancer Neg Hx     Social:  reports that she has never smoked. She has never been exposed to tobacco smoke. She has never used smokeless tobacco. She reports that she does not currently use alcohol. She reports that she does not use drugs.  Allergies:  Allergies  Allergen Reactions   Demerol [Meperidine] Nausea And Vomiting   Kenalog [Triamcinolone Acetonide] Other (See Comments)    Cause vertigo/dizziness   Adhesive [Tape] Rash and Other (See Comments)    PAPER TAPE OK    Medications: I have reviewed the patient's current medications.  Results for orders placed or performed in visit on 12/29/22 (from the past 48 hour(s))  Cervicovaginal ancillary only( [place])     Status: None   Collection Time: 12/29/22 11:10 AM  Result Value Ref Range   Bacterial Vaginitis (gardnerella) Negative    Candida Vaginitis Negative    Candida Glabrata Negative    Comment      Normal Reference Range Bacterial Vaginosis - Negative   Comment Normal Reference Range Candida Species - Negative    Comment Normal Reference Range Candida Galbrata - Negative   POCT Urinalysis Dipstick     Status: Abnormal   Collection Time: 12/29/22 11:28 AM  Result Value Ref Range   Color, UA other    Clarity, UA cloudy    Glucose, UA Negative Negative   Bilirubin, UA negative    Ketones, UA negative    Spec Grav, UA >=1.030 (A) 1.010 - 1.025    Blood, UA small    pH, UA 6.0 5.0 - 8.0   Protein, UA Positive (A) Negative   Urobilinogen, UA 0.2 0.2 or 1.0 E.U./dL   Nitrite, UA negative    Leukocytes, UA Large (3+) (A) Negative   Appearance     Odor    Urinalysis, Routine w reflex microscopic     Status: Abnormal   Collection Time: 12/29/22 11:28 AM  Result Value Ref Range   Color, Urine YELLOW Yellow;Lt. Yellow;Straw;Dark Yellow;Amber;Green;Red;Brown   APPearance Cloudy (A) Clear;Turbid;Slightly Cloudy;Cloudy   Specific Gravity, Urine 1.025 1.000 - 1.030   pH 6.5 5.0 - 8.0   Total Protein, Urine 30 (A) Negative   Urine Glucose NEGATIVE Negative   Ketones, ur NEGATIVE Negative   Bilirubin Urine NEGATIVE Negative   Hgb urine dipstick SMALL (A) Negative   Urobilinogen, UA 0.2 0.0 - 1.0   Leukocytes,Ua LARGE (A) Negative   Nitrite NEGATIVE Negative   WBC, UA TNTC(>50/hpf) (A) 0-2/hpf   RBC / HPF 7-10/hpf (A) 0-2/hpf   Mucus, UA Presence of (A) None   Squamous Epithelial / HPF Rare(0-4/hpf) Rare(0-4/hpf)   Bacteria, UA Many(>50/hpf) (A) None    No results found.   PE There were no vitals taken for this visit. Constitutional: NAD; conversant Eyes: Moist conjunctiva Lungs: Normal respiratory effort CV: RRR GI: Abd soft, NT/ND Psychiatric: Appropriate affect  Results for orders placed or performed in visit on 12/29/22 (from the past 48 hour(s))  Cervicovaginal ancillary only( [place])     Status: None   Collection Time: 12/29/22 11:10 AM  Result Value Ref Range   Bacterial Vaginitis (gardnerella) Negative    Candida Vaginitis Negative    Candida Glabrata Negative    Comment      Normal Reference Range Bacterial Vaginosis - Negative   Comment Normal Reference Range Candida Species - Negative    Comment Normal Reference Range Candida Galbrata - Negative   POCT Urinalysis Dipstick     Status: Abnormal   Collection Time: 12/29/22 11:28 AM  Result Value Ref Range   Color, UA other    Clarity, UA cloudy     Glucose, UA Negative Negative   Bilirubin, UA negative    Ketones, UA negative    Spec Grav, UA >=1.030 (A) 1.010 - 1.025   Blood, UA small    pH, UA 6.0 5.0 - 8.0   Protein, UA Positive (A) Negative   Urobilinogen, UA 0.2 0.2 or 1.0 E.U./dL   Nitrite, UA negative    Leukocytes, UA Large (3+) (A) Negative   Appearance     Odor    Urinalysis, Routine w reflex microscopic     Status: Abnormal   Collection Time: 12/29/22 11:28 AM  Result Value Ref Range   Color, Urine YELLOW Yellow;Lt. Yellow;Straw;Dark Yellow;Amber;Green;Red;Brown   APPearance Cloudy (A) Clear;Turbid;Slightly Cloudy;Cloudy   Specific Gravity, Urine 1.025 1.000 - 1.030   pH 6.5 5.0 - 8.0   Total Protein, Urine 30 (A) Negative   Urine Glucose NEGATIVE Negative   Ketones, ur NEGATIVE Negative   Bilirubin Urine NEGATIVE Negative   Hgb urine dipstick SMALL (A) Negative   Urobilinogen, UA 0.2 0.0 - 1.0   Leukocytes,Ua LARGE (A) Negative   Nitrite NEGATIVE Negative   WBC, UA TNTC(>50/hpf) (A) 0-2/hpf   RBC / HPF 7-10/hpf (A) 0-2/hpf   Mucus, UA Presence of (A) None   Squamous Epithelial / HPF Rare(0-4/hpf) Rare(0-4/hpf)   Bacteria, UA Many(>50/hpf) (A) None    No results found.  A/P: Tracy Estrada is an 72 y.o. female with hx of allergies, fibromyalgia here for surgery for colostomy reversal  -The anatomy and physiology of the GI tract was reviewed with the patient and her husband as it pertains to her current anatomy -We discussed colostomy reversal surgery is an 'elective' surgery and may be considered for quality of life reasons. She is motivated to restore continuity. -We have therefore discussed robotic  assisted colostomy takedown, possible low anterior resection, lysis of adhesions, flexible sigmoidoscopy, [facility] for cystoscopy/ureteral ICG. -Discussed scenarios where "open" surgery may be necessary as well and situations where reversal may not be technically possible/safe. -The planned procedure,  material risks (including, but not limited to, pain, bleeding, infection, scarring, need for blood transfusion, damage to surrounding structures- blood vessels/nerves/viscus/organs, damage to ureter, urine leak, leak from anastomosis, need for additional procedures, scenarios where another stoma may be necessary and where it may be permanent, worsening of pre-existing medical conditions, hernia, recurrence, pneumonia, heart attack, stroke, death) benefits and alternatives to surgery were discussed at length. The patient's questions were answered to her satisfaction, she voiced understanding and elected to proceed with surgery. Additionally, we discussed typical postoperative expectations and the recovery process.  Marin Olp, MD [facility], [facility]

## 2022-12-30 NOTE — Telephone Encounter (Signed)
Pt would like to be called regarding a visit she had with NP Arnett on yesterday

## 2022-12-31 ENCOUNTER — Inpatient Hospital Stay (HOSPITAL_COMMUNITY)
Admission: RE | Admit: 2022-12-31 | Discharge: 2023-01-03 | DRG: 327 | Disposition: A | Discharge status: Home / Self Care | Payer: Medicare Other | Source: Ambulatory Visit | Attending: Surgery | Admitting: Surgery

## 2022-12-31 ENCOUNTER — Other Ambulatory Visit (HOSPITAL_COMMUNITY): Payer: Self-pay

## 2022-12-31 ENCOUNTER — Encounter (HOSPITAL_COMMUNITY): Payer: Self-pay | Admitting: Surgery

## 2022-12-31 ENCOUNTER — Inpatient Hospital Stay (HOSPITAL_COMMUNITY): Payer: Medicare Other | Admitting: Anesthesiology

## 2022-12-31 ENCOUNTER — Other Ambulatory Visit: Payer: Self-pay

## 2022-12-31 ENCOUNTER — Inpatient Hospital Stay (HOSPITAL_COMMUNITY): Payer: Medicare Other | Admitting: Medical

## 2022-12-31 ENCOUNTER — Encounter (HOSPITAL_COMMUNITY)
Admission: RE | Disposition: A | Discharge status: Home / Self Care | Payer: Self-pay | Source: Ambulatory Visit | Attending: Surgery

## 2022-12-31 DIAGNOSIS — K5732 Diverticulitis of large intestine without perforation or abscess without bleeding: Principal | ICD-10-CM | POA: Diagnosis present

## 2022-12-31 DIAGNOSIS — Z933 Colostomy status: Secondary | ICD-10-CM | POA: Diagnosis not present

## 2022-12-31 DIAGNOSIS — Z408 Encounter for other prophylactic surgery: Secondary | ICD-10-CM | POA: Diagnosis not present

## 2022-12-31 DIAGNOSIS — Z83719 Family history of colon polyps, unspecified: Secondary | ICD-10-CM | POA: Diagnosis not present

## 2022-12-31 DIAGNOSIS — Z8249 Family history of ischemic heart disease and other diseases of the circulatory system: Secondary | ICD-10-CM

## 2022-12-31 DIAGNOSIS — K5792 Diverticulitis of intestine, part unspecified, without perforation or abscess without bleeding: Secondary | ICD-10-CM | POA: Diagnosis not present

## 2022-12-31 DIAGNOSIS — E7800 Pure hypercholesterolemia, unspecified: Secondary | ICD-10-CM | POA: Diagnosis present

## 2022-12-31 DIAGNOSIS — Z433 Encounter for attention to colostomy: Principal | ICD-10-CM

## 2022-12-31 DIAGNOSIS — Z8711 Personal history of peptic ulcer disease: Secondary | ICD-10-CM | POA: Diagnosis not present

## 2022-12-31 DIAGNOSIS — F32A Depression, unspecified: Secondary | ICD-10-CM | POA: Diagnosis present

## 2022-12-31 DIAGNOSIS — G4700 Insomnia, unspecified: Secondary | ICD-10-CM | POA: Diagnosis present

## 2022-12-31 DIAGNOSIS — Z823 Family history of stroke: Secondary | ICD-10-CM

## 2022-12-31 DIAGNOSIS — Z888 Allergy status to other drugs, medicaments and biological substances status: Secondary | ICD-10-CM

## 2022-12-31 DIAGNOSIS — F411 Generalized anxiety disorder: Secondary | ICD-10-CM | POA: Diagnosis present

## 2022-12-31 DIAGNOSIS — Z820 Family history of epilepsy and other diseases of the nervous system: Secondary | ICD-10-CM

## 2022-12-31 DIAGNOSIS — K660 Peritoneal adhesions (postprocedural) (postinfection): Secondary | ICD-10-CM | POA: Diagnosis present

## 2022-12-31 DIAGNOSIS — Z83438 Family history of other disorder of lipoprotein metabolism and other lipidemia: Secondary | ICD-10-CM

## 2022-12-31 DIAGNOSIS — Z9889 Other specified postprocedural states: Principal | ICD-10-CM

## 2022-12-31 DIAGNOSIS — N736 Female pelvic peritoneal adhesions (postinfective): Secondary | ICD-10-CM | POA: Diagnosis present

## 2022-12-31 DIAGNOSIS — M797 Fibromyalgia: Secondary | ICD-10-CM | POA: Diagnosis present

## 2022-12-31 DIAGNOSIS — Z885 Allergy status to narcotic agent status: Secondary | ICD-10-CM

## 2022-12-31 HISTORY — PX: XI ROBOTIC ASSISTED COLOSTOMY TAKEDOWN: SHX6828

## 2022-12-31 HISTORY — PX: LYSIS OF ADHESION: SHX5961

## 2022-12-31 HISTORY — PX: FLEXIBLE SIGMOIDOSCOPY: SHX5431

## 2022-12-31 LAB — URINE CULTURE
MICRO NUMBER:: 15632937
SPECIMEN QUALITY:: ADEQUATE

## 2022-12-31 LAB — TYPE AND SCREEN
ABO/RH(D): B POS
Antibody Screen: NEGATIVE

## 2022-12-31 SURGERY — CLOSURE, COLOSTOMY, ROBOT-ASSISTED
Anesthesia: General

## 2022-12-31 MED ORDER — STERILE WATER FOR IRRIGATION IR SOLN
Status: DC | PRN
  Administered 2022-12-31: 500 mL

## 2022-12-31 MED ORDER — POTASSIUM 99 MG PO TABS: 99.0000 mg | ORAL_TABLET | Freq: Every day | ORAL | Status: DC

## 2022-12-31 MED ORDER — ACETAMINOPHEN 500 MG PO TABS: 1000.0000 mg | ORAL_TABLET | ORAL | Status: DC

## 2022-12-31 MED ORDER — ALVIMOPAN 12 MG PO CAPS
12.0000 mg | ORAL_CAPSULE | ORAL | Status: AC
  Administered 2022-12-31: 12 mg via ORAL
  Filled 2022-12-31: qty 1

## 2022-12-31 MED ORDER — SODIUM CHLORIDE 0.9 % IR SOLN
Status: DC | PRN
  Administered 2022-12-31: 500 mL

## 2022-12-31 MED ORDER — DEXTROSE-SODIUM CHLORIDE 5-0.45 % IV SOLN
INTRAVENOUS | Status: AC
Start: 2022-12-31 — End: 2023-01-01

## 2022-12-31 MED ORDER — LIDOCAINE HCL (PF) 2 % IJ SOLN
INTRAMUSCULAR | Status: AC
  Filled 2022-12-31: qty 5

## 2022-12-31 MED ORDER — HEPARIN SODIUM (PORCINE) 5000 UNIT/ML IJ SOLN
5000.0000 [IU] | Freq: Once | INTRAMUSCULAR | Status: AC
  Administered 2022-12-31: 5000 [IU] via SUBCUTANEOUS
  Filled 2022-12-31: qty 1

## 2022-12-31 MED ORDER — MIDAZOLAM HCL 5 MG/5ML IJ SOLN
INTRAMUSCULAR | Status: DC | PRN
  Administered 2022-12-31: 1 mg via INTRAVENOUS

## 2022-12-31 MED ORDER — AMOXICILLIN-POT CLAVULANATE 875-125 MG PO TABS
1.0000 | ORAL_TABLET | Freq: Two times a day (BID) | ORAL | 0 refills | Status: DC
  Filled 2022-12-31: qty 12, 6d supply, fill #0

## 2022-12-31 MED ORDER — HYDROMORPHONE HCL 1 MG/ML IJ SOLN: 0.5000 mg | INTRAMUSCULAR | Status: DC | PRN

## 2022-12-31 MED ORDER — NEOMYCIN SULFATE 500 MG PO TABS
1000.0000 mg | ORAL_TABLET | ORAL | Status: DC
  Filled 2022-12-31: qty 2

## 2022-12-31 MED ORDER — DEXAMETHASONE SODIUM PHOSPHATE 10 MG/ML IJ SOLN
INTRAMUSCULAR | Status: DC | PRN
  Administered 2022-12-31: 5 mg via INTRAVENOUS

## 2022-12-31 MED ORDER — ROCURONIUM BROMIDE 100 MG/10ML IV SOLN
INTRAVENOUS | Status: DC | PRN
  Administered 2022-12-31: 10 mg via INTRAVENOUS
  Administered 2022-12-31: 50 mg via INTRAVENOUS

## 2022-12-31 MED ORDER — CHLORHEXIDINE GLUCONATE 0.12 % MT SOLN
15.0000 mL | Freq: Once | OROMUCOSAL | Status: AC
  Administered 2022-12-31: 15 mL via OROMUCOSAL

## 2022-12-31 MED ORDER — ENSURE PRE-SURGERY PO LIQD
296.0000 mL | Freq: Once | ORAL | Status: DC
  Filled 2022-12-31: qty 296

## 2022-12-31 MED ORDER — ROCURONIUM BROMIDE 10 MG/ML (PF) SYRINGE
PREFILLED_SYRINGE | INTRAVENOUS | Status: AC
Start: 2022-12-31 — End: ?
  Filled 2022-12-31: qty 10

## 2022-12-31 MED ORDER — OXYCODONE HCL 5 MG PO TABS: 5.0000 mg | ORAL_TABLET | Freq: Once | ORAL | Status: DC | PRN

## 2022-12-31 MED ORDER — CHLORHEXIDINE GLUCONATE CLOTH 2 % EX PADS: 6.0000 | MEDICATED_PAD | Freq: Once | CUTANEOUS | Status: DC

## 2022-12-31 MED ORDER — TRAMADOL HCL 50 MG PO TABS
50.0000 mg | ORAL_TABLET | Freq: Four times a day (QID) | ORAL | 0 refills | Status: AC | PRN
  Filled 2022-12-31: qty 15, 4d supply, fill #0

## 2022-12-31 MED ORDER — FENTANYL CITRATE (PF) 100 MCG/2ML IJ SOLN
INTRAMUSCULAR | Status: AC
  Filled 2022-12-31: qty 2

## 2022-12-31 MED ORDER — BUPROPION HCL ER (XL) 150 MG PO TB24
300.0000 mg | ORAL_TABLET | Freq: Every day | ORAL | Status: DC
  Administered 2023-01-01 – 2023-01-03 (×3): 300 mg via ORAL
  Filled 2022-12-31 (×3): qty 2

## 2022-12-31 MED ORDER — ONDANSETRON HCL 4 MG PO TABS: 4.0000 mg | ORAL_TABLET | Freq: Four times a day (QID) | ORAL | Status: DC | PRN

## 2022-12-31 MED ORDER — ORAL CARE MOUTH RINSE: 15.0000 mL | Freq: Once | OROMUCOSAL | Status: AC

## 2022-12-31 MED ORDER — ENSURE SURGERY PO LIQD
237.0000 mL | Freq: Two times a day (BID) | ORAL | Status: DC
  Administered 2023-01-01 – 2023-01-03 (×3): 237 mL via ORAL

## 2022-12-31 MED ORDER — BISACODYL 5 MG PO TBEC: 20.0000 mg | DELAYED_RELEASE_TABLET | Freq: Once | ORAL | Status: DC

## 2022-12-31 MED ORDER — SODIUM CHLORIDE 0.9 % IV SOLN
2.0000 g | INTRAVENOUS | Status: AC
  Administered 2022-12-31: 2 g via INTRAVENOUS
  Filled 2022-12-31: qty 2

## 2022-12-31 MED ORDER — VITAMIN B-12 1000 MCG PO TABS
1000.0000 ug | ORAL_TABLET | Freq: Every day | ORAL | Status: DC
  Administered 2023-01-01 – 2023-01-03 (×3): 1000 ug via ORAL
  Filled 2022-12-31 (×3): qty 1

## 2022-12-31 MED ORDER — OXYCODONE HCL 5 MG/5ML PO SOLN: 5.0000 mg | Freq: Once | ORAL | Status: DC | PRN

## 2022-12-31 MED ORDER — TRAMADOL HCL 50 MG PO TABS
50.0000 mg | ORAL_TABLET | Freq: Four times a day (QID) | ORAL | Status: DC | PRN
  Administered 2023-01-01: 50 mg via ORAL
  Filled 2022-12-31: qty 1

## 2022-12-31 MED ORDER — DIPHENHYDRAMINE HCL 12.5 MG/5ML PO ELIX: 12.5000 mg | ORAL_SOLUTION | Freq: Four times a day (QID) | ORAL | Status: DC | PRN

## 2022-12-31 MED ORDER — IBUPROFEN 400 MG PO TABS: 600.0000 mg | ORAL_TABLET | Freq: Four times a day (QID) | ORAL | Status: DC | PRN

## 2022-12-31 MED ORDER — MIDAZOLAM HCL 2 MG/2ML IJ SOLN
INTRAMUSCULAR | Status: AC
  Filled 2022-12-31: qty 2

## 2022-12-31 MED ORDER — HEPARIN SODIUM (PORCINE) 5000 UNIT/ML IJ SOLN
5000.0000 [IU] | Freq: Three times a day (TID) | INTRAMUSCULAR | Status: DC
  Administered 2022-12-31 – 2023-01-03 (×8): 5000 [IU] via SUBCUTANEOUS
  Filled 2022-12-31 (×8): qty 1

## 2022-12-31 MED ORDER — POLYETHYLENE GLYCOL 3350 17 GM/SCOOP PO POWD
1.0000 | Freq: Once | ORAL | Status: DC
  Filled 2022-12-31: qty 255

## 2022-12-31 MED ORDER — SIMETHICONE 80 MG PO CHEW
40.0000 mg | CHEWABLE_TABLET | Freq: Four times a day (QID) | ORAL | Status: DC | PRN
Start: 2022-12-31 — End: 2023-01-03

## 2022-12-31 MED ORDER — LACTATED RINGERS IV SOLN: INTRAVENOUS | Status: DC

## 2022-12-31 MED ORDER — FENTANYL CITRATE PF 50 MCG/ML IJ SOSY: 25.0000 ug | PREFILLED_SYRINGE | INTRAMUSCULAR | Status: DC | PRN

## 2022-12-31 MED ORDER — METRONIDAZOLE 500 MG PO TABS
1000.0000 mg | ORAL_TABLET | ORAL | Status: DC
  Filled 2022-12-31: qty 2

## 2022-12-31 MED ORDER — STERILE WATER FOR INJECTION IJ SOLN
INTRAMUSCULAR | Status: AC
Start: 2022-12-31 — End: ?
  Filled 2022-12-31: qty 10

## 2022-12-31 MED ORDER — FENTANYL CITRATE (PF) 100 MCG/2ML IJ SOLN
INTRAMUSCULAR | Status: DC | PRN
  Administered 2022-12-31 (×2): 50 ug via INTRAVENOUS
  Administered 2022-12-31: 100 ug via INTRAVENOUS

## 2022-12-31 MED ORDER — BUPIVACAINE LIPOSOME 1.3 % IJ SUSP: 20.0000 mL | Freq: Once | INTRAMUSCULAR | Status: DC

## 2022-12-31 MED ORDER — LIDOCAINE HCL (PF) 2 % IJ SOLN
INTRAMUSCULAR | Status: DC | PRN
  Administered 2022-12-31: 1.5 mg/kg/h via INTRADERMAL

## 2022-12-31 MED ORDER — HYDRALAZINE HCL 20 MG/ML IJ SOLN: 10.0000 mg | INTRAMUSCULAR | Status: DC | PRN

## 2022-12-31 MED ORDER — FLUTICASONE PROPIONATE 50 MCG/ACT NA SUSP: 2.0000 | Freq: Every day | NASAL | Status: DC | PRN

## 2022-12-31 MED ORDER — ONDANSETRON HCL 4 MG/2ML IJ SOLN
INTRAMUSCULAR | Status: DC | PRN
  Administered 2022-12-31: 4 mg via INTRAVENOUS

## 2022-12-31 MED ORDER — PROPOFOL 1000 MG/100ML IV EMUL
INTRAVENOUS | Status: AC
Start: 2022-12-31 — End: ?
  Filled 2022-12-31: qty 100

## 2022-12-31 MED ORDER — PRAVASTATIN SODIUM 20 MG PO TABS
40.0000 mg | ORAL_TABLET | Freq: Every day | ORAL | Status: DC
  Administered 2022-12-31 – 2023-01-02 (×3): 40 mg via ORAL
  Filled 2022-12-31 (×3): qty 2

## 2022-12-31 MED ORDER — ALUM & MAG HYDROXIDE-SIMETH 200-200-20 MG/5ML PO SUSP: 30.0000 mL | Freq: Four times a day (QID) | ORAL | Status: DC | PRN

## 2022-12-31 MED ORDER — SUGAMMADEX SODIUM 200 MG/2ML IV SOLN
INTRAVENOUS | Status: DC | PRN
Start: 2022-12-31 — End: 2022-12-31
  Administered 2022-12-31: 100 mg via INTRAVENOUS

## 2022-12-31 MED ORDER — ALVIMOPAN 12 MG PO CAPS
12.0000 mg | ORAL_CAPSULE | Freq: Two times a day (BID) | ORAL | Status: DC
  Administered 2023-01-01: 12 mg via ORAL
  Filled 2022-12-31: qty 1

## 2022-12-31 MED ORDER — BUPIVACAINE-EPINEPHRINE 0.25% -1:200000 IJ SOLN
INTRAMUSCULAR | Status: AC
Start: 2022-12-31 — End: ?
  Filled 2022-12-31: qty 1

## 2022-12-31 MED ORDER — PROPOFOL 500 MG/50ML IV EMUL
INTRAVENOUS | Status: AC
  Filled 2022-12-31: qty 50

## 2022-12-31 MED ORDER — DIPHENHYDRAMINE HCL 50 MG/ML IJ SOLN: 12.5000 mg | Freq: Four times a day (QID) | INTRAMUSCULAR | Status: DC | PRN

## 2022-12-31 MED ORDER — ACETAMINOPHEN 500 MG PO TABS
1000.0000 mg | ORAL_TABLET | Freq: Four times a day (QID) | ORAL | Status: DC
  Administered 2022-12-31 – 2023-01-03 (×9): 1000 mg via ORAL
  Filled 2022-12-31 (×11): qty 2

## 2022-12-31 MED ORDER — LIDOCAINE HCL (CARDIAC) PF 100 MG/5ML IV SOSY
PREFILLED_SYRINGE | INTRAVENOUS | Status: DC | PRN
  Administered 2022-12-31: 60 mg via INTRAVENOUS

## 2022-12-31 MED ORDER — ONDANSETRON HCL 4 MG/2ML IJ SOLN
INTRAMUSCULAR | Status: AC
  Filled 2022-12-31: qty 2

## 2022-12-31 MED ORDER — BUPIVACAINE-EPINEPHRINE (PF) 0.25% -1:200000 IJ SOLN
INTRAMUSCULAR | Status: DC | PRN
  Administered 2022-12-31: 50 mL

## 2022-12-31 MED ORDER — ONDANSETRON HCL 4 MG/2ML IJ SOLN: 4.0000 mg | Freq: Four times a day (QID) | INTRAMUSCULAR | Status: DC | PRN

## 2022-12-31 MED ORDER — STERILE WATER FOR INJECTION IJ SOLN
INTRAMUSCULAR | Status: DC | PRN
  Administered 2022-12-31: 15 mL

## 2022-12-31 MED ORDER — BUPIVACAINE LIPOSOME 1.3 % IJ SUSP
INTRAMUSCULAR | Status: AC
  Filled 2022-12-31: qty 20

## 2022-12-31 MED ORDER — AMOXICILLIN-POT CLAVULANATE 875-125 MG PO TABS
1.0000 | ORAL_TABLET | Freq: Two times a day (BID) | ORAL | Status: DC
  Administered 2022-12-31 – 2023-01-03 (×6): 1 via ORAL
  Filled 2022-12-31 (×6): qty 1

## 2022-12-31 MED ORDER — VITAMIN D 25 MCG (1000 UNIT) PO TABS
1000.0000 [IU] | ORAL_TABLET | Freq: Every day | ORAL | Status: DC
  Administered 2023-01-01 – 2023-01-03 (×3): 1000 [IU] via ORAL
  Filled 2022-12-31 (×3): qty 1

## 2022-12-31 MED ORDER — LACTATED RINGERS IR SOLN
Status: DC | PRN
  Administered 2022-12-31: 1000 mL

## 2022-12-31 MED ORDER — PHENYLEPHRINE HCL (PRESSORS) 10 MG/ML IV SOLN
INTRAVENOUS | Status: DC | PRN
  Administered 2022-12-31 (×3): 80 ug via INTRAVENOUS

## 2022-12-31 MED ORDER — PROPOFOL 10 MG/ML IV BOLUS
INTRAVENOUS | Status: DC | PRN
  Administered 2022-12-31: 150 mg via INTRAVENOUS
  Administered 2022-12-31: 60 ug/kg/min via INTRAVENOUS

## 2022-12-31 MED ORDER — ENSURE PRE-SURGERY PO LIQD
592.0000 mL | Freq: Once | ORAL | Status: DC
  Filled 2022-12-31: qty 592

## 2022-12-31 SURGICAL SUPPLY — 121 items
APL PRP STRL LF DISP 70% ISPRP (MISCELLANEOUS) ×1
APPLIER CLIP 5 13 M/L LIGAMAX5 (MISCELLANEOUS)
APPLIER CLIP ROT 10 11.4 M/L (STAPLE)
APR CLP MED LRG 11.4X10 (STAPLE)
APR CLP MED LRG 5 ANG JAW (MISCELLANEOUS)
BAG COUNTER SPONGE SURGICOUNT (BAG) IMPLANT
BAG SPNG CNTER NS LX DISP (BAG)
BAG URO CATCHER STRL LF (MISCELLANEOUS) ×1 IMPLANT
BLADE EXTENDED COATED 6.5IN (ELECTRODE) ×1 IMPLANT
CANNULA REDUCER 12-8 DVNC XI (CANNULA) ×1 IMPLANT
CATH URETL OPEN 5X70 (CATHETERS) IMPLANT
CATH URETL OPEN END 6FR 70 (CATHETERS) IMPLANT
CHLORAPREP W/TINT 26 (MISCELLANEOUS) ×1 IMPLANT
CLIP APPLIE 5 13 M/L LIGAMAX5 (MISCELLANEOUS) IMPLANT
CLIP APPLIE ROT 10 11.4 M/L (STAPLE) IMPLANT
CLIP LIGATING HEM O LOK PURPLE (MISCELLANEOUS) IMPLANT
CLIP LIGATING HEMO O LOK GREEN (MISCELLANEOUS) IMPLANT
CLOTH BEACON ORANGE TIMEOUT ST (SAFETY) ×1 IMPLANT
COVER SURGICAL LIGHT HANDLE (MISCELLANEOUS) ×2 IMPLANT
COVER TIP SHEARS 8 DVNC (MISCELLANEOUS) ×1 IMPLANT
DEFOGGER SCOPE WARMER CLEARIFY (MISCELLANEOUS) ×1 IMPLANT
DEVICE TROCAR PUNCTURE CLOSURE (ENDOMECHANICALS) IMPLANT
DRAIN CHANNEL 19F RND (DRAIN) ×1 IMPLANT
DRAPE ARM DVNC X/XI (DISPOSABLE) ×4 IMPLANT
DRAPE COLUMN DVNC XI (DISPOSABLE) ×1 IMPLANT
DRAPE SURG IRRIG POUCH 19X23 (DRAPES) ×1 IMPLANT
DRIVER NDL LRG 8 DVNC XI (INSTRUMENTS) ×1 IMPLANT
DRIVER NDLE LRG 8 DVNC XI (INSTRUMENTS)
DRSG OPSITE POSTOP 4X10 (GAUZE/BANDAGES/DRESSINGS) IMPLANT
DRSG OPSITE POSTOP 4X6 (GAUZE/BANDAGES/DRESSINGS) IMPLANT
DRSG OPSITE POSTOP 4X8 (GAUZE/BANDAGES/DRESSINGS) IMPLANT
DRSG TEGADERM 2-3/8X2-3/4 SM (GAUZE/BANDAGES/DRESSINGS) ×5 IMPLANT
DRSG TEGADERM 4X4.75 (GAUZE/BANDAGES/DRESSINGS) ×1 IMPLANT
ELECT REM PT RETURN 15FT ADLT (MISCELLANEOUS) ×1 IMPLANT
ENDOLOOP SUT PDS II 0 18 (SUTURE) IMPLANT
EVACUATOR SILICONE 100CC (DRAIN) ×1 IMPLANT
FORCEPS BPLR FENES DVNC XI (FORCEP) IMPLANT
GAUZE SPONGE 2X2 8PLY STRL LF (GAUZE/BANDAGES/DRESSINGS) ×1 IMPLANT
GAUZE SPONGE 4X4 12PLY STRL (GAUZE/BANDAGES/DRESSINGS) IMPLANT
GLOVE BIO SURGEON STRL SZ7.5 (GLOVE) ×3 IMPLANT
GLOVE BIOGEL M 7.0 STRL (GLOVE) ×1 IMPLANT
GLOVE INDICATOR 8.0 STRL GRN (GLOVE) ×3 IMPLANT
GOWN SRG XL LVL 4 BRTHBL STRL (GOWNS) ×1 IMPLANT
GOWN STRL NON-REIN XL LVL4 (GOWNS) ×1
GOWN STRL REUS W/ TWL XL LVL3 (GOWN DISPOSABLE) ×6 IMPLANT
GOWN STRL REUS W/TWL XL LVL3 (GOWN DISPOSABLE) ×6
GRASPER SUT TROCAR 14GX15 (MISCELLANEOUS) IMPLANT
GRASPER TIP-UP FEN DVNC XI (INSTRUMENTS) ×1 IMPLANT
GUIDEWIRE STR DUAL SENSOR (WIRE) IMPLANT
GUIDEWIRE ZIPWRE .038 STRAIGHT (WIRE) IMPLANT
HOLDER FOLEY CATH W/STRAP (MISCELLANEOUS) ×1 IMPLANT
IRRIG SUCT STRYKERFLOW 2 WTIP (MISCELLANEOUS) ×1
IRRIGATION SUCT STRKRFLW 2 WTP (MISCELLANEOUS) ×1 IMPLANT
KIT PROCEDURE DVNC SI (MISCELLANEOUS) ×1 IMPLANT
KIT TURNOVER KIT A (KITS) IMPLANT
MANIFOLD NEPTUNE II (INSTRUMENTS) ×1 IMPLANT
NDL INSUFFLATION 14GA 120MM (NEEDLE) ×1 IMPLANT
NEEDLE INSUFFLATION 14GA 120MM (NEEDLE) ×1
PACK CARDIOVASCULAR III (CUSTOM PROCEDURE TRAY) ×1 IMPLANT
PACK COLON (CUSTOM PROCEDURE TRAY) ×1 IMPLANT
PACK CYSTO (CUSTOM PROCEDURE TRAY) ×1 IMPLANT
PAD POSITIONING PINK XL (MISCELLANEOUS) ×1 IMPLANT
PAD PREP 24X48 CUFFED NSTRL (MISCELLANEOUS) ×1 IMPLANT
PENCIL SMOKE EVACUATOR (MISCELLANEOUS) IMPLANT
PROTECTOR NERVE ULNAR (MISCELLANEOUS) ×2 IMPLANT
RELOAD STAPLE 45 3.5 BLU DVNC (STAPLE) IMPLANT
RELOAD STAPLE 45 4.3 GRN DVNC (STAPLE) IMPLANT
RELOAD STAPLE 60 3.5 BLU DVNC (STAPLE) IMPLANT
RELOAD STAPLE 60 4.3 GRN DVNC (STAPLE) IMPLANT
RETRACTOR WND ALEXIS 18 MED (MISCELLANEOUS) IMPLANT
RTRCTR WOUND ALEXIS 18CM MED (MISCELLANEOUS)
SCISSORS LAP 5X35 DISP (ENDOMECHANICALS) IMPLANT
SCISSORS MNPLR CVD DVNC XI (INSTRUMENTS) ×1 IMPLANT
SEAL UNIV 5-12 XI (MISCELLANEOUS) ×4 IMPLANT
SEALER VESSEL EXT DVNC XI (MISCELLANEOUS) ×1 IMPLANT
SLEEVE ADV FIXATION 5X100MM (TROCAR) IMPLANT
SOL ELECTROSURG ANTI STICK (MISCELLANEOUS) ×1
SOLUTION ELECTROSURG ANTI STCK (MISCELLANEOUS) ×1 IMPLANT
SPIKE FLUID TRANSFER (MISCELLANEOUS) ×1 IMPLANT
STAPLER 60 SUREFORM DVNC (STAPLE) IMPLANT
STAPLER ECHELON POWER CIR 29 (STAPLE) IMPLANT
STAPLER ECHELON POWER CIR 31 (STAPLE) IMPLANT
STAPLER RELOAD 3.5X45 BLU DVNC (STAPLE)
STAPLER RELOAD 3.5X60 BLU DVNC (STAPLE)
STAPLER RELOAD 4.3X45 GRN DVNC (STAPLE)
STAPLER RELOAD 4.3X60 GRN DVNC (STAPLE)
STOPCOCK 4 WAY LG BORE MALE ST (IV SETS) ×2 IMPLANT
SURGILUBE 2OZ TUBE FLIPTOP (MISCELLANEOUS) ×1 IMPLANT
SUT MNCRL AB 4-0 PS2 18 (SUTURE) ×1 IMPLANT
SUT PDS AB 1 CT1 27 (SUTURE) IMPLANT
SUT PDS AB 1 TP1 96 (SUTURE) IMPLANT
SUT PROLENE 0 CT 2 (SUTURE) IMPLANT
SUT PROLENE 2 0 KS (SUTURE) ×1 IMPLANT
SUT PROLENE 2 0 SH DA (SUTURE) IMPLANT
SUT SILK 2 0 (SUTURE) ×1
SUT SILK 2 0 SH (SUTURE) IMPLANT
SUT SILK 2 0 SH CR/8 (SUTURE) IMPLANT
SUT SILK 2-0 18XBRD TIE 12 (SUTURE) IMPLANT
SUT SILK 3 0 (SUTURE)
SUT SILK 3 0 SH CR/8 (SUTURE) ×1 IMPLANT
SUT SILK 3-0 18XBRD TIE 12 (SUTURE) ×1 IMPLANT
SUT V-LOC BARB 180 2/0GR6 GS22 (SUTURE)
SUT VIC AB 3-0 SH 18 (SUTURE) IMPLANT
SUT VIC AB 3-0 SH 27 (SUTURE)
SUT VIC AB 3-0 SH 27XBRD (SUTURE) IMPLANT
SUT VICRYL 0 UR6 27IN ABS (SUTURE) ×1 IMPLANT
SUTURE V-LC BRB 180 2/0GR6GS22 (SUTURE) IMPLANT
SYR 10ML LL (SYRINGE) ×1 IMPLANT
SYS LAPSCP GELPORT 120MM (MISCELLANEOUS)
SYS WOUND ALEXIS 18CM MED (MISCELLANEOUS) ×1
SYSTEM LAPSCP GELPORT 120MM (MISCELLANEOUS) IMPLANT
SYSTEM WOUND ALEXIS 18CM MED (MISCELLANEOUS) ×1 IMPLANT
TAPE CLOTH SURG 4X10 WHT LF (GAUZE/BANDAGES/DRESSINGS) IMPLANT
TAPE UMBILICAL 1/8 X36 TWILL (MISCELLANEOUS) ×1 IMPLANT
TOWEL OR NON WOVEN STRL DISP B (DISPOSABLE) ×1 IMPLANT
TRAY FOLEY MTR SLVR 14FR STAT (SET/KITS/TRAYS/PACK) IMPLANT
TRAY FOLEY MTR SLVR 16FR STAT (SET/KITS/TRAYS/PACK) ×1 IMPLANT
TROCAR ADV FIXATION 5X100MM (TROCAR) ×1 IMPLANT
TUBING CONNECTING 10 (TUBING) ×4 IMPLANT
TUBING INSUFFLATION 10FT LAP (TUBING) ×1 IMPLANT
TUBING UROLOGY SET (TUBING) IMPLANT

## 2022-12-31 NOTE — Telephone Encounter (Signed)
LVM to call back to discuss pt message

## 2022-12-31 NOTE — H&P (Signed)
Urology Preoperative H&P   Chief Complaint: Diverticulitis  History of Present Illness: Tracy Estrada is a 72 y.o. female with a history of diverticulitis here for colostomy reversal. Urology was asked to inject firefly into bilateral ureters. She denies fevers, chills, dysuria. No prior history of stones.    Past Medical History:  Diagnosis Date   Achilles bursitis or tendinitis    Anxiety state, unspecified    Arthritis    Bilateral hydronephrosis 12/12/2021   Closed fracture of unspecified part of fibula    Depressive disorder, not elsewhere classified    Diverticulosis of colon (without mention of hemorrhage)    Edema    Fibromyalgia    Insomnia, unspecified    Intra-abdominal abscess (HCC) 06/21/2021   Lumbago    Mass of left ovary 06/01/2016   Meniere's disease 1986   MVP (mitral valve prolapse)    Other intra-abdominal and pelvic swelling, mass and lump 06/01/2016   Pain in joint, site unspecified    Peptic ulcer    no testing   Pure hypercholesterolemia    TSH elevation    Urinary tract infection 12/12/2021    Past Surgical History:  Procedure Laterality Date   ABDOMINAL HYSTERECTOMY  03/08/1980   fibroids; ovaries intact.   BACK SURGERY     x2 Cervical and lumbar   BOWEL RESECTION N/A 12/22/2021   Procedure: SIGMOID COLECTOMY AND COLOSTOMY (HARTMAN'S PROCEDURE);  Surgeon: Abigail Miyamoto, MD;  Location: [facility] OR;  Service: General;  Laterality: N/A;   COLONOSCOPY  2017   CYSTOSCOPY N/A 06/08/2016   Procedure: Bluford Kaufmann;  Surgeon: Conard Novak, MD;  Location: [facility] ORS;  Service: Gynecology;  Laterality: N/A;   IR CATHETER TUBE CHANGE  10/21/2021   IR CATHETER TUBE CHANGE  12/15/2021   IR RADIOLOGIST EVAL & MGMT  07/10/2021   IR RADIOLOGIST EVAL & MGMT  08/11/2021   IR RADIOLOGIST EVAL & MGMT  09/09/2021   IR RADIOLOGIST EVAL & MGMT  10/09/2021   IR RADIOLOGIST EVAL & MGMT  11/02/2021   IR RADIOLOGIST EVAL & MGMT  11/24/2021   LAPAROSCOPIC LYSIS OF  ADHESIONS  06/08/2016   Procedure: LAPAROSCOPIC LYSIS OF ADHESIONS;  Surgeon: Conard Novak, MD;  Location: [facility] ORS;  Service: Gynecology;;   LAPAROSCOPIC SALPINGO OOPHERECTOMY Left 06/08/2016   Procedure: LAPAROSCOPIC SALPINGO OOPHORECTOMY;  Surgeon: Conard Novak, MD;  Location: [facility] ORS;  Service: Gynecology;  Laterality: Left;   OOPHORECTOMY     left ovary   SALPINGECTOMY     left   SPINE SURGERY     lumbar spine 11/2015; cervical spine 2009. Elsner.   TONSILLECTOMY      Allergies:  Allergies  Allergen Reactions   Demerol [Meperidine] Nausea And Vomiting   Kenalog [Triamcinolone Acetonide] Other (See Comments)    Cause vertigo/dizziness   Adhesive [Tape] Rash and Other (See Comments)    PAPER TAPE OK    Family History  Problem Relation Age of Onset   Hyperlipidemia Mother    Hypertension Mother    Colon polyps Mother    Coronary artery disease Father         AMI age 45   Heart disease Father 23       AMI x 2; first AMI in 31s   Breast cancer Sister 50       with metastasis to bone; smoker   Cancer Sister 64       breast cancer   Colon cancer Maternal Uncle  dx. >50   Colon cancer Paternal Aunt        dx. 80s   Heart attack Paternal Uncle    Stroke Maternal Grandmother    Alzheimer's disease Paternal Grandmother    Heart disease Paternal Grandfather    Heart attack Paternal Grandfather    Healthy Daughter    Healthy Daughter    Breast cancer Cousin    Ovarian cancer Cousin        dx. late 70s/dx. 60s   Lung cancer Cousin        dx. 70s   Stomach cancer Other    Esophageal cancer Neg Hx    Rectal cancer Neg Hx     Social History:  reports that she has never smoked. She has never been exposed to tobacco smoke. She has never used smokeless tobacco. She reports that she does not currently use alcohol. She reports that she does not use drugs.  ROS: A complete review of systems was performed.  All systems are negative except for pertinent  findings as noted.  Physical Exam:  Vital signs in last 24 hours: Temp:  [98 F (36.7 C)] 98 F (36.7 C) (10/25 0600) Pulse Rate:  [85] 85 (10/25 0600) Resp:  [16] 16 (10/25 0600) BP: (126)/(62) 126/62 (10/25 0600) SpO2:  [98 %] 98 % (10/25 0600) Weight:  [49.4 kg] 49.4 kg (10/25 0600) Constitutional:  Alert and oriented, No acute distress Cardiovascular: Regular rate and rhythm Respiratory: Normal respiratory effort, Lungs clear bilaterally GI: Abdomen is soft, nontender, nondistended, no abdominal masses GU: No CVA tenderness Lymphatic: No lymphadenopathy Neurologic: Grossly intact, no focal deficits Psychiatric: Normal mood and affect  Laboratory Data:  No results for input(s): "WBC", "HGB", "HCT", "PLT" in the last 72 hours.  No results for input(s): "NA", "K", "CL", "GLUCOSE", "BUN", "CALCIUM", "CREATININE" in the last 72 hours.  Invalid input(s): "CO3"   No results found for this or any previous visit (from the past 24 hour(s)). No results found for this or any previous visit (from the past 240 hour(s)).  Renal Function: No results for input(s): "CREATININE" in the last 168 hours. Estimated Creatinine Clearance: 44.3 mL/min (by C-G formula based on SCr of 0.71 mg/dL).  Radiologic Imaging: No results found.  I independently reviewed the above imaging studies.  Assessment and Plan Sista Tracy Estrada is a 72 y.o. female with diverticulitis here for colostomy reversal and firefly injection into ureters.  -The risks, benefits and alternatives of cystoscopy with firefly into ureters was discussed with the patient.  Risks include, but are not limited to: bleeding, urinary tract infection, ureteral injury, ureteral stricture disease, chronic pain, urinary symptoms, bladder injury, stent migration, the need for nephrostomy tube placement, MI, CVA, DVT, PE and the inherent risks with general anesthesia.  The patient voices understanding and wishes to proceed.      Tracy Estrada Cinquemani  MD 12/31/2022, 7:18 AM  [facility] Pager: [phone]

## 2022-12-31 NOTE — Op Note (Signed)
PATIENT: Tracy Estrada  72 y.o. female  Patient Care Team: Doreene Nest, NP as PCP - General (Internal Medicine) Corky Downs, MD (Cardiology) Barnett Abu, MD (Neurosurgery)  PREOP DIAGNOSIS: DIVERTICULITIS  POSTOP DIAGNOSIS: DIVERTICULITIS  PROCEDURE:  Robotic assisted takedown of end colostomy with colorectal anastomosis Robotic lysis of adhesions x 100 minutes Flexible sigmoidoscopy Bilateral transversus abdominus plane (TAP) blocks  SURGEON: Stephanie Coup. Cliffton Asters, MD  ASSISTANT: Romie Levee, MD  ANESTHESIA: General endotracheal  EBL: 100 mL Total I/O In: 1000 [I.V.:1000] Out: 300 [Urine:200; Blood:100]  DRAINS: None  SPECIMEN: Colostomy  COUNTS: Sponge, needle and instrument counts were reported correct x2  FINDINGS: Adhesions of omentum across the midline in the lower half.  Small bowel containing adhesions throughout the left lower quadrant and into the pelvis, necessitating adhesiolysis to facilitate ostomy reversal.  Healthy rectal stump with no evident residual sigmoid colon.  Healthy descending colon going out to the colostomy.  A well perfused, tension free, hemostatic, air tight 29 mm EEA colorectal anastomosis fashioned 11 cm from the anal verge by flexible sigmoidoscopy.  Of note, she does have a somewhat medialized right ureter.   NARRATIVE: Informed consent was verified. The patient was taken to the operating room, placed supine on the operating table and SCD's were applied. General endotracheal anesthesia was induced without difficulty. She was then positioned in the lithotomy position with Allen stirrups.  Pressure points were evaluated and padded.  A foley catheter was then placed by nursing under sterile conditions. Hair on the abdomen was clipped.  She was secured to the operating table. Dr. Cardell Peach with [facility] scrubbed for his portion of the procedure.  Please refer to his notes for details.. The abdomen was then prepped and draped in the  standard sterile fashion. Surgical timeout was called indicating the correct patient, procedure, positioning and need for preoperative antibiotics.   An OG tube was placed by anesthesia and confirmed to be to suction.  At Palmer's point, a stab incision was created and the Veress needle was introduced into the peritoneal cavity on the first attempt.  Intraperitoneal location was confirmed by the aspiration and saline drop test.  Pneumoperitoneum was established to a maximum pressure of 15 mmHg using CO2.  Following this, the abdomen was marked for planned trocar sites.  Just to the right and cephalad to the umbilicus, an 8 mm incision was created and an 8 mm blunt tipped robotic trocar was cautiously placed into the peritoneal cavity.  The laparoscope was inserted and demonstrated no evidence of trocar site nor Veress needle site complications.  The Veress needle was removed.  Bilateral transversus abdominis plane blocks were then created using a dilute mixture of Exparel with Marcaine.  3 additional 8 mm robotic trochars were placed under direct visualization roughly in a line extending from the right ASIS towards the left upper quadrant. The bladder was inspected and noted to be at/below the pubic symphysis.  Staying 3 fingerbreadths above the pubic symphysis, an incision was created and the 12 mm robotic trocar inserted directed cephalad into the peritoneal cavity under direct visualization.  An additional 5 mm assist port was placed in the right lateral abdomen under direct visualization.  The abdomen was surveyed and there was adhesions across the low midline of omentum.  She was positioned in Trendelenburg with the left side tilted slightly up.  Small bowel was carefully retracted out of the pelvis.  The robot was then docked and I went to the console.  We began  with adhesiolysis. Adhesions consisting of omentum were first taken down from the midline.  This was done with the vessel sealer.  There also  omental contain adhesions going out to the colostomy as well as in her left lower quadrant.  These were all lysed.  After this, it was apparent she also had a fair amount of small bowel containing adhesions in her pelvis, left lower quadrant, and even right lower quadrant.  Sharp adhesiolysis was then carried out dissecting all the small bowel containing adhesions of the pelvis in order to facilitate colostomy takedown.  While doing this, we were using near-infrared light intermittently to aid in visualizing both the left and right ureter.  Her right ureter is somewhat medialized.  After lysing all small bowel containing adhesions we subsequently spent time meticulously inspecting the small bowel.  There were no evident serosal injuries or damage to the vascular supply of the small bowel. Total time with adhesiolysis is approximately 100 minutes.  Attention was then directed identification of the [facility] stump.  There are some Prolene sutures marking an evident stump in the pelvis.  This was carefully mobilized, posteriorly first maintaining a space between the fascia propria the rectum and the presacral fascia.  The stump was also then mobilized laterally.  We then freed a small amount of the stump anteriorly separating it from her vaginal cuff.  I then went below and passed EEA sizers under direct visualization carefully.  A 29 mm EEA sizer was cautiously introduced into the anus and advanced up to the level of the rectal stump.  It appears that the stump has been well mobilized and dissected.  There is no evident injury to the stump.  It is healthy in appearance.  There is no evident remnant sigmoid colon.  Bimanual examination also revealed that the vaginal cuff is well away from our rectal stump.  I went back to the console.  The left and right ureters were again reidentified and we confirmed these were all well away from our planes of dissection which they were and remained within the retroperitoneum.  We then ensured that we had adequate mobilization of her colon.  The descending colon is fully mobilized by incising the Aviyah Swetz line of Toldt.  We mobilized the associate mesocolon as well.  Omental attachments to the descending colon were also freed.  In doing this, appears we have more than adequate reach of the planned descending colon into the deep pelvis without any tension.    All adhesions have been separated from the colostomy in the abdominal wall.  The descending colon had been mobilized all the way up to the level of the splenic flexure and there was more than adequate reach to the deep pelvis without any further mobilization.  Attention was then directed at the colostomy.  I scrubbed back in.  The colostomy was circumferentially incised.  The subcutaneous fat was divided.  The colostomy was able to be circumferentially dissected free of the surrounding fascia.  The colostomy was then inspected and there is no evidence of injury.  Just proximal to the mucocutaneous junction, the mesentery was cleared and ligated.  A pursestring device was then placed.  A 2-0 Prolene on a Keith needle was passed.  The colostomy was excised and passed off the specimen.  3-0 silk sutures were used to create belt loops around the pursestring.  EEA sizers were passed and a 29 mm EEA selected.  The anvil was placed in the pursestring tied.  A small  amount of fat was cleared from the planned staple line.  Quality of the tissues is good.  This is placed back into the abdomen.  An Alexis wound protector was placed. A cap is placed and pneumoperitoneum reestablished.  The anvil reaches into the deep pelvis without any tension and remains in that location.  I then went below to pass the stapler.  Under direct visualization, EEA sizers were serially passed.  The 29 mm EEA stapler was passed.  The spike is deployed just anterior to the staple line.  The components were then mated.  Orientation is confirmed such that there is no  twisting of the colon or small bowel underneath the mesenteric defect.  The stapler was then closed, held, and fired.  Colon proximally anastomosis is gently occluded.  The pelvis was filled with irrigation.  I passed the flexible sigmoidoscope to perform a leak test.  The anastomosis is hemostatic in appearance and airtight.  All tissues are pink in color. This is located at 11 cm from the anal verge by flexible sigmoidoscopy. Additionally, looking from above, there is no tension on the colon or mesentery.  Sigmoidoscope was withdrawn. I scrubbed back in. Irrigation was evacuated from the pelvis.  The abdomen and pelvis are surveyed and noted to be completely hemostatic without any apparent injury.  Under direct visualization, all trochars are removed.  The Alexis wound protector was removed.    The rectus fascia at the former colostomy site was then closed using 2 running #1 PDS sutures.  This is able to be done without any significant tension.  The fascia was then palpated and noted to be completely closed.  Additional anesthetic was infiltrated at this site.   Sponge, needle, and instrument counts were reported correct x2. 4-0 Monocryl subcuticular suture was used to close the skin of all incision sites.  Dermabond was placed over all incisions.  A 0 Vicryl pursestring suture was used to cinch the skin down at the former colostomy site.  The wound is then wicked with a moist 4 x 4 gauze.  Additional gauze was placed and secured with tape.  She was then taken out of lithotomy, awakened from anesthesia, extubated, and transferred to a stretcher for transport to PACU in satisfactory condition having tolerated the procedure well.

## 2022-12-31 NOTE — Anesthesia Preprocedure Evaluation (Signed)
Anesthesia Evaluation  Patient identified by MRN, date of birth, ID band Patient awake    Reviewed: Allergy & Precautions, H&P , NPO status , Patient's Chart, lab work & pertinent test results  Airway Mallampati: II   Neck ROM: full    Dental   Pulmonary neg pulmonary ROS   breath sounds clear to auscultation       Cardiovascular + Valvular Problems/Murmurs MVP  Rhythm:regular Rate:Normal     Neuro/Psych  PSYCHIATRIC DISORDERS Anxiety Depression     Neuromuscular disease    GI/Hepatic PUD,,,  Endo/Other    Renal/GU      Musculoskeletal  (+) Arthritis ,  Fibromyalgia -  Abdominal   Peds  Hematology   Anesthesia Other Findings   Reproductive/Obstetrics                             Anesthesia Physical Anesthesia Plan  ASA: 2  Anesthesia Plan: General   Post-op Pain Management:    Induction: Intravenous  PONV Risk Score and Plan: 3 and Ondansetron, Dexamethasone and Treatment may vary due to age or medical condition  Airway Management Planned: Oral ETT  Additional Equipment:   Intra-op Plan:   Post-operative Plan: Extubation in OR  Informed Consent: I have reviewed the patients History and Physical, chart, labs and discussed the procedure including the risks, benefits and alternatives for the proposed anesthesia with the patient or authorized representative who has indicated his/her understanding and acceptance.     Dental advisory given  Plan Discussed with: CRNA, Anesthesiologist and Surgeon  Anesthesia Plan Comments:        Anesthesia Quick Evaluation

## 2022-12-31 NOTE — Op Note (Signed)
Operative Note  Preoperative diagnosis:  1.  Diverticulitis  Postoperative diagnosis: 1.  Diverticulitis  Procedure(s): 1.  Cystoscopy 2. Firefly instillation in bilateral ureters  Surgeon: Jettie Pagan, MD  Assistants:  None  Anesthesia:  General  Complications:  None  EBL:  None for my portion of the case  Specimens: 1. None for my portion of the case  Drains/Catheters: 1.  16 Fr foley catheter  Intraoperative findings:   Bladder without suspicious bladder lesions.  Indication:  Tracy Estrada is a  72 year old female with a history of diverticulitis and colostomy here for colostomy takedown with cystoscopy and firefly placement.  Description of procedure: The patient was identified and surgical site verification was performed prior to obtaining consent.  The patient was brought to the operating suite.  Under adequate general anesthesia the patient was positioned in dorsal lithotomy and prepped and draped.  A preoperative Time Out was performed addressing the anticipated surgical site, procedure, and safety precautions.  The 21Fr cystoscope was inserted into the patient's urethral meatus and advanced to the bladder.   The bladder was inspected with the 30 degree lens.  The ureteral orifices were in their normal anatomic positions.  The bladder showed no trabeculation.  There were no bladder tumors noted.  The right orifice was intubated with a sensor wire and a 5 [place] open ended catheter was placed over the wire into the right ureter and advanced to 25 cm. I then slowly pulled back and injected 7.55ml of the firefly contrast.  Subsequently turned my attention to the patient's left ureteral orifice and performed a similar task, injecting 7.36ml of the firefly solution. in a retrograde fashion in the left ureter.   I then  placed a 16 [place] Foley.  The case was then turned over to Dr. Lucilla Lame team for the remainder of the procedure.  Matt R. Everitt Wenner MD [facility]  Pager:  [phone]

## 2022-12-31 NOTE — Discharge Instructions (Signed)
POST OP INSTRUCTIONS AFTER COLON SURGERY  DIET: Be sure to include lots of fluids daily to stay hydrated - 64oz of water per day (8, 8 oz glasses).  Avoid fast food or heavy meals for the first couple of weeks as your are more likely to get nauseated. Avoid raw/uncooked fruits or vegetables for the first 4 weeks (its ok to have these if they are blended into smoothie form). If you have fruits/vegetables, make sure they are cooked until soft enough to mash on the roof of your mouth and chew your food well. Otherwise, diet as tolerated.  Take your usually prescribed home medications unless otherwise directed.  PAIN CONTROL: Pain is best controlled by a usual combination of three different methods TOGETHER: Ice/Heat Over the counter pain medication Prescription pain medication Most patients will experience some swelling and bruising around the surgical site.  Ice packs or heating pads (30-60 minutes up to 6 times a day) will help. Some people prefer to use ice alone, heat alone, alternating between ice & heat.  Experiment to what works for you.  Swelling and bruising can take several weeks to resolve.   It is helpful to take an over-the-counter pain medication regularly for the first few weeks: Ibuprofen (Motrin/Advil) - 200mg  tabs - take 3 tabs (600mg ) every 6 hours as needed for pain (unless you have been directed previously to avoid NSAIDs/ibuprofen) Acetaminophen (Tylenol) - you may take 650mg  every 6 hours as needed. You can take this with motrin as they act differently on the body. If you are taking a narcotic pain medication that has acetaminophen in it, do not take over the counter tylenol at the same time. NOTE: You may take both of these medications together - most patients  find it most helpful when alternating between the two (i.e. Ibuprofen at 6am, tylenol at 9am, ibuprofen at 12pm ..Marland Kitchen) A  prescription for pain medication should be given to you upon discharge.  Take your pain medication as  prescribed if your pain is not adequatly controlled with the over-the-counter pain reliefs mentioned above.  Avoid getting constipated.  Between the surgery and the pain medications, it is common to experience some constipation.  Increasing fluid intake and taking a fiber supplement (such as Metamucil, Citrucel, FiberCon, MiraLax, etc) 1-2 times a day regularly will usually help prevent this problem from occurring.  A mild laxative (prune juice, Milk of Magnesia, MiraLax, etc) should be taken according to package directions if there are no bowel movements after 48 hours.    Dressing: Your incisions are covered in Dermabond which is like sterile superglue for the skin. This will come off on it's own in a couple weeks. It is waterproof and you may bathe normally starting the day after your surgery in a shower. Avoid baths/pools/lakes/oceans until your wounds have fully healed.  Your former colostomy site may be covered with a dry gauze and changed daily.  It is okay to get this wet in the shower with soap and water over the wound.  No baths/pools/lakes/oceans until the wound is fully healed.  There is no need to "pack" the wound at present.  ACTIVITIES as tolerated:   Avoid heavy lifting (>10lbs or 1 gallon of milk) for the next 6 weeks. You may resume regular daily activities as tolerated--such as daily self-care, walking, climbing stairs--gradually increasing activities as tolerated.  If you can walk 30 minutes without difficulty, it is safe to try more intense activity such as jogging, treadmill, bicycling, low-impact aerobics.  DO NOT PUSH THROUGH PAIN.  Let pain be your guide: If it hurts to do something, don't do it. You may drive when you are no longer taking prescription pain medication, you can comfortably wear a seatbelt, and you can safely maneuver your car and apply brakes.  FOLLOW UP in our office Please call [facility] at [phone] to set up an appointment to see your surgeon in the office for  a follow-up appointment approximately 2 weeks after your surgery. Make sure that you call for this appointment the day you arrive home to insure a convenient appointment time.  9. If you have disability or family leave forms that need to be completed, you may have them completed by your primary care physician's office; for return to work instructions, please ask our office staff and they will be happy to assist you in obtaining this documentation   When to call us [phone]: Poor pain control Reactions / problems with new medications (rash/itching, etc)  Fever over 101.5 F (38.5 C) Inability to urinate Nausea/vomiting Worsening swelling or bruising Continued bleeding from incision. Increased pain, redness, or drainage from the incision  The clinic staff is available to answer your questions during regular business hours (8:30am-5pm).  Please don't hesitate to call and ask to speak to one of our nurses for clinical concerns.   A surgeon from [facility] is always on call at the hospitals   If you have a medical emergency, go to the nearest emergency room or call 911.  [facility] [address], [place], [place]  [address] MAIN: [phone] FAX: [phone] [email or web address]

## 2022-12-31 NOTE — Transfer of Care (Signed)
Immediate Anesthesia Transfer of Care Note  Patient: Tracy Estrada  Procedure(s) Performed: XI ROBOTIC ASSISTED COLOSTOMY TAKEDOWN LYSIS OF ADHESION FLEXIBLE SIGMOIDOSCOPY CYSTOSCOPY with FIREFLY INJECTION  Patient Location: PACU  Anesthesia Type:General  Level of Consciousness: drowsy  Airway & Oxygen Therapy: Patient Spontanous Breathing  Post-op Assessment: Report given to RN  Post vital signs: Reviewed and stable  Last Vitals:  Vitals Value Taken Time  BP 143/84 12/31/22 1034  Temp    Pulse 90 12/31/22 1039  Resp 13 12/31/22 1039  SpO2 93 % 12/31/22 1039  Vitals shown include unfiled device data.  Last Pain:  Vitals:   12/31/22 0600  PainSc: 0-No pain      Patients Stated Pain Goal: 4 (12/31/22 0600)  Complications: No notable events documented.

## 2022-12-31 NOTE — Anesthesia Postprocedure Evaluation (Signed)
Anesthesia Post Note  Patient: Addison Lank  Procedure(s) Performed: XI ROBOTIC ASSISTED COLOSTOMY TAKEDOWN LYSIS OF ADHESION FLEXIBLE SIGMOIDOSCOPY CYSTOSCOPY with FIREFLY INJECTION     Patient location during evaluation: PACU Anesthesia Type: General Level of consciousness: awake and alert Pain management: pain level controlled Vital Signs Assessment: post-procedure vital signs reviewed and stable Respiratory status: spontaneous breathing, nonlabored ventilation, respiratory function stable and patient connected to nasal cannula oxygen Cardiovascular status: blood pressure returned to baseline and stable Postop Assessment: no apparent nausea or vomiting Anesthetic complications: no   No notable events documented.  Last Vitals:  Vitals:   12/31/22 1130 12/31/22 1145  BP: (!) 146/73 (!) 146/76  Pulse: 85 87  Resp: 16 16  Temp:  36.9 C  SpO2: 98% 99%    Last Pain:  Vitals:   12/31/22 1200  PainSc: 0-No pain                 May Manrique S

## 2023-01-01 ENCOUNTER — Encounter (HOSPITAL_COMMUNITY): Payer: Self-pay | Admitting: Surgery

## 2023-01-01 LAB — BASIC METABOLIC PANEL
Anion gap: 6 (ref 5–15)
BUN: 8 mg/dL (ref 8–23)
CO2: 27 mmol/L (ref 22–32)
Calcium: 9 mg/dL (ref 8.9–10.3)
Chloride: 106 mmol/L (ref 98–111)
Creatinine, Ser: 0.53 mg/dL (ref 0.44–1.00)
GFR, Estimated: >60 mL/min (ref >60)
Glucose, Bld: 142 mg/dL — ABNORMAL HIGH (ref 70–99)
Potassium: 3.8 mmol/L (ref 3.5–5.1)
Sodium: 139 mmol/L (ref 135–145)

## 2023-01-01 LAB — CBC
HCT: 36.3 % (ref 36.0–46.0)
Hemoglobin: 11.9 g/dL — ABNORMAL LOW (ref 12.0–15.0)
MCH: 33.1 pg (ref 26.0–34.0)
MCHC: 32.8 g/dL (ref 30.0–36.0)
MCV: 100.8 fL — ABNORMAL HIGH (ref 80.0–100.0)
Platelets: 145 10*3/uL — ABNORMAL LOW (ref 150–400)
RBC: 3.6 MIL/uL — ABNORMAL LOW (ref 3.87–5.11)
RDW: 11.8 % (ref 11.5–15.5)
WBC: 8.5 10*3/uL (ref 4.0–10.5)
nRBC: 0 % (ref 0.0–0.2)

## 2023-01-01 NOTE — Progress Notes (Signed)
1 Day Post-Op   Subjective/Chief Complaint: Patient doing well.  No nausea or vomiting.  No bowel movement yet.  Tolerating liquids   Objective: Vital signs in last 24 hours: Temp:  [98.4 F (36.9 C)-99.2 F (37.3 C)] 98.7 F (37.1 C) (10/26 0859) Pulse Rate:  [75-100] 86 (10/26 0859) Resp:  [15-18] 16 (10/26 0859) BP: (104-151)/(59-79) 124/59 (10/26 0859) SpO2:  [94 %-100 %] 95 % (10/26 0859) Weight:  [52 kg] 52 kg (10/26 0500) Last BM Date : 12/31/22  Intake/Output from previous day: 10/25 0701 - 10/26 0700 In: 2560.2 [P.O.:630; I.V.:1930.2] Out: 3086 [VHQIO:9629; Blood:100] Intake/Output this shift: No intake/output data recorded.  General appearance: alert and cooperative Resp: clear to auscultation bilaterally Cardio: Normal sinus rhythm Incision/Wound: Lower incision clean dry intact.  Appropriately tender ostomy gauze in place.  Serosanguineous drainage noted.  Lab Results:  Recent Labs    01/01/23 0516  WBC 8.5  HGB 11.9*  HCT 36.3  PLT 145*   BMET Recent Labs    01/01/23 0516  NA 139  K 3.8  CL 106  CO2 27  GLUCOSE 142*  BUN 8  CREATININE 0.53  CALCIUM 9.0   PT/INR No results for input(s): "LABPROT", "INR" in the last 72 hours. ABG No results for input(s): "PHART", "HCO3" in the last 72 hours.  Invalid input(s): "PCO2", "PO2"  Studies/Results: No results found.  Anti-infectives: Anti-infectives (From admission, onward)    Start     Dose/Rate Route Frequency Ordered Stop   01/01/23 0000  amoxicillin-clavulanate (AUGMENTIN) 875-125 MG tablet        1 tablet Oral Every 12 hours 12/31/22 1643 01/07/23 2359   12/31/22 2200  amoxicillin-clavulanate (AUGMENTIN) 875-125 MG per tablet 1 tablet        1 tablet Oral Every 12 hours 12/31/22 1641 01/07/23 2159   12/31/22 1400  neomycin (MYCIFRADIN) tablet 1,000 mg  Status:  Discontinued       Placed in "And" Linked Group   1,000 mg Oral 3 times per day 12/31/22 0550 12/31/22 0605   12/31/22 1400   metroNIDAZOLE (FLAGYL) tablet 1,000 mg  Status:  Discontinued       Placed in "And" Linked Group   1,000 mg Oral 3 times per day 12/31/22 0550 12/31/22 0605   12/31/22 0600  cefoTEtan (CEFOTAN) 2 g in sodium chloride 0.9 % 100 mL IVPB        2 g 200 mL/hr over 30 Minutes Intravenous On call to O.R. 12/31/22 0550 12/31/22 1834       Assessment/Plan: s/p Procedure(s): XI ROBOTIC ASSISTED COLOSTOMY TAKEDOWN (N/A) LYSIS OF ADHESION (N/A) FLEXIBLE SIGMOIDOSCOPY (N/A) CYSTOSCOPY with FIREFLY INJECTION (N/A) Advance diet  Ambulate Once bowel function returns and pain control, she may be discharged home   LOS: 1 day    Dortha Schwalbe MD 01/01/2023

## 2023-01-01 NOTE — Progress Notes (Signed)
Mobility Specialist - Progress Note   01/01/23 1054  Mobility  Activity Ambulated with assistance in hallway  Level of Assistance Modified independent, requires aide device or extra time  Assistive Device Front wheel walker  Distance Ambulated (ft) 500 ft  Activity Response Tolerated well  Mobility Referral Yes  $Mobility charge 1 Mobility  Mobility Specialist Start Time (ACUTE ONLY) 1025  Mobility Specialist Stop Time (ACUTE ONLY) 1037  Mobility Specialist Time Calculation (min) (ACUTE ONLY) 12 min   Pt received in bed and agreeable to mobility. No complaints during session. Pt to bed after session with all needs met.     [facility]

## 2023-01-01 NOTE — Plan of Care (Signed)

## 2023-01-01 NOTE — Plan of Care (Signed)
  Problem: Education: Goal: Understanding of discharge needs will improve Outcome: Progressing   

## 2023-01-02 LAB — CBC
HCT: 36.3 % (ref 36.0–46.0)
Hemoglobin: 11.9 g/dL — ABNORMAL LOW (ref 12.0–15.0)
MCH: 33.3 pg (ref 26.0–34.0)
MCHC: 32.8 g/dL (ref 30.0–36.0)
MCV: 101.7 fL — ABNORMAL HIGH (ref 80.0–100.0)
Platelets: 141 10*3/uL — ABNORMAL LOW (ref 150–400)
RBC: 3.57 MIL/uL — ABNORMAL LOW (ref 3.87–5.11)
RDW: 11.9 % (ref 11.5–15.5)
WBC: 5.6 10*3/uL (ref 4.0–10.5)
nRBC: 0 % (ref 0.0–0.2)

## 2023-01-02 LAB — BASIC METABOLIC PANEL
Anion gap: 8 (ref 5–15)
BUN: 17 mg/dL (ref 8–23)
CO2: 30 mmol/L (ref 22–32)
Calcium: 9.2 mg/dL (ref 8.9–10.3)
Chloride: 104 mmol/L (ref 98–111)
Creatinine, Ser: 0.57 mg/dL (ref 0.44–1.00)
GFR, Estimated: >60 mL/min (ref >60)
Glucose, Bld: 100 mg/dL — ABNORMAL HIGH (ref 70–99)
Potassium: 3.7 mmol/L (ref 3.5–5.1)
Sodium: 142 mmol/L (ref 135–145)

## 2023-01-02 NOTE — Progress Notes (Signed)
2 Days Post-Op   Subjective/Chief Complaint: Patient tolerating diet.  Minimal abdominal pain.  Ambulating well.   Objective: Vital signs in last 24 hours: Temp:  [98.3 F (36.8 C)-99.1 F (37.3 C)] 98.3 F (36.8 C) (10/27 0558) Pulse Rate:  [82-99] 82 (10/27 0558) Resp:  [15-18] 16 (10/27 0558) BP: (125-132)/(65-74) 127/74 (10/27 0558) SpO2:  [93 %-94 %] 94 % (10/27 0558) Last BM Date : 01/01/23  Intake/Output from previous day: 10/26 0701 - 10/27 0700 In: 1000 [P.O.:1000] Out: 500 [Urine:500] Intake/Output this shift: No intake/output data recorded.  General appearance: alert and cooperative Resp: clear to auscultation bilaterally Cardio: Normal sinus rhythm Incision/Wound: Incisions clean dry intact.  Dressing in place.  Lab Results:  Recent Labs    01/01/23 0516 01/02/23 0511  WBC 8.5 5.6  HGB 11.9* 11.9*  HCT 36.3 36.3  PLT 145* 141*   BMET Recent Labs    01/01/23 0516 01/02/23 0511  NA 139 142  K 3.8 3.7  CL 106 104  CO2 27 30  GLUCOSE 142* 100*  BUN 8 17  CREATININE 0.53 0.57  CALCIUM 9.0 9.2   PT/INR No results for input(s): "LABPROT", "INR" in the last 72 hours. ABG No results for input(s): "PHART", "HCO3" in the last 72 hours.  Invalid input(s): "PCO2", "PO2"  Studies/Results: No results found.  Anti-infectives: Anti-infectives (From admission, onward)    Start     Dose/Rate Route Frequency Ordered Stop   01/01/23 0000  amoxicillin-clavulanate (AUGMENTIN) 875-125 MG tablet        1 tablet Oral Every 12 hours 12/31/22 1643 01/07/23 2359   12/31/22 2200  amoxicillin-clavulanate (AUGMENTIN) 875-125 MG per tablet 1 tablet        1 tablet Oral Every 12 hours 12/31/22 1641 01/07/23 2159   12/31/22 1400  neomycin (MYCIFRADIN) tablet 1,000 mg  Status:  Discontinued       Placed in "And" Linked Group   1,000 mg Oral 3 times per day 12/31/22 0550 12/31/22 0605   12/31/22 1400  metroNIDAZOLE (FLAGYL) tablet 1,000 mg  Status:  Discontinued        Placed in "And" Linked Group   1,000 mg Oral 3 times per day 12/31/22 0550 12/31/22 0605   12/31/22 0600  cefoTEtan (CEFOTAN) 2 g in sodium chloride 0.9 % 100 mL IVPB        2 g 200 mL/hr over 30 Minutes Intravenous On call to O.R. 12/31/22 0550 12/31/22 1834       Assessment/Plan: s/p Procedure(s): XI ROBOTIC ASSISTED COLOSTOMY TAKEDOWN (N/A) LYSIS OF ADHESION (N/A) FLEXIBLE SIGMOIDOSCOPY (N/A) CYSTOSCOPY with FIREFLY INJECTION (N/A) Continue diet advancement  Continue to ambulate.  Patient should be ready for discharge Monday a.m.  LOS: 2 days    Dortha Schwalbe  MD  01/02/2023

## 2023-01-02 NOTE — Progress Notes (Signed)
Mobility Specialist - Progress Note   01/02/23 0925  Mobility  Activity Ambulated with assistance in hallway;Ambulated with assistance to bathroom  Level of Assistance Standby assist, set-up cues, supervision of patient - no hands on  Assistive Device Front wheel walker  Distance Ambulated (ft) 500 ft  Activity Response Tolerated well  Mobility Referral Yes  $Mobility charge 1 Mobility  Mobility Specialist Start Time (ACUTE ONLY) [id number]  Mobility Specialist Stop Time (ACUTE ONLY) [id number]  Mobility Specialist Time Calculation (min) (ACUTE ONLY) 18 min   Pt received in bed and agreeable to mobility. Prior to ambulating, pt requested assistance to bathroom for BM. Pt was minA from STS. No complaints during session. Pt to bed after session with all needs met.  [facility]

## 2023-01-03 ENCOUNTER — Other Ambulatory Visit (HOSPITAL_COMMUNITY): Payer: Self-pay

## 2023-01-03 ENCOUNTER — Other Ambulatory Visit: Payer: Self-pay

## 2023-01-03 DIAGNOSIS — N760 Acute vaginitis: Secondary | ICD-10-CM

## 2023-01-03 LAB — BASIC METABOLIC PANEL
Anion gap: 7 (ref 5–15)
BUN: 14 mg/dL (ref 8–23)
CO2: 28 mmol/L (ref 22–32)
Calcium: 9.1 mg/dL (ref 8.9–10.3)
Chloride: 103 mmol/L (ref 98–111)
Creatinine, Ser: 0.52 mg/dL (ref 0.44–1.00)
GFR, Estimated: >60 mL/min (ref >60)
Glucose, Bld: 88 mg/dL (ref 70–99)
Potassium: 3.4 mmol/L — ABNORMAL LOW (ref 3.5–5.1)
Sodium: 138 mmol/L (ref 135–145)

## 2023-01-03 LAB — CBC
HCT: 35.9 % — ABNORMAL LOW (ref 36.0–46.0)
Hemoglobin: 12.1 g/dL (ref 12.0–15.0)
MCH: 33.6 pg (ref 26.0–34.0)
MCHC: 33.7 g/dL (ref 30.0–36.0)
MCV: 99.7 fL (ref 80.0–100.0)
Platelets: 163 10*3/uL (ref 150–400)
RBC: 3.6 MIL/uL — ABNORMAL LOW (ref 3.87–5.11)
RDW: 11.9 % (ref 11.5–15.5)
WBC: 4.6 10*3/uL (ref 4.0–10.5)
nRBC: 0 % (ref 0.0–0.2)

## 2023-01-03 NOTE — Progress Notes (Signed)
Discharge instructions given to patient and all questions were answered.  

## 2023-01-03 NOTE — Progress Notes (Signed)
3 Days Post-Op   Subjective/Chief Complaint: Patient tolerating diet.  Minimal abdominal pain.  Ambulating well. Having flatus and BM. Feels quite well   Objective: Vital signs in last 24 hours: Temp:  [98.2 F (36.8 C)-98.8 F (37.1 C)] 98.2 F (36.8 C) (10/28 0527) Pulse Rate:  [86-88] 86 (10/28 0527) Resp:  [16-18] 16 (10/28 0527) BP: (139-144)/(64-77) 141/64 (10/28 0527) SpO2:  [93 %-96 %] 94 % (10/28 0527) Last BM Date : 01/02/23  Intake/Output from previous day: 10/27 0701 - 10/28 0700 In: 450 [P.O.:450] Out: -  Intake/Output this shift: No intake/output data recorded.  General appearance: alert and cooperative Resp: clear to auscultation bilaterally Cardio: Normal sinus rhythm Incision/Wound: Incisions clean dry intact.  Packing out. Wound looks great  Lab Results:  Recent Labs    01/02/23 0511 01/03/23 0653  WBC 5.6 4.6  HGB 11.9* 12.1  HCT 36.3 35.9*  PLT 141* 163   BMET Recent Labs    01/02/23 0511 01/03/23 0653  NA 142 138  K 3.7 3.4*  CL 104 103  CO2 30 28  GLUCOSE 100* 88  BUN 17 14  CREATININE 0.57 0.52  CALCIUM 9.2 9.1   PT/INR No results for input(s): "LABPROT", "INR" in the last 72 hours. ABG No results for input(s): "PHART", "HCO3" in the last 72 hours.  Invalid input(s): "PCO2", "PO2"  Studies/Results: No results found.  Anti-infectives: Anti-infectives (From admission, onward)    Start     Dose/Rate Route Frequency Ordered Stop   01/01/23 0000  amoxicillin-clavulanate (AUGMENTIN) 875-125 MG tablet        1 tablet Oral Every 12 hours 12/31/22 1643 01/07/23 2359   12/31/22 2200  amoxicillin-clavulanate (AUGMENTIN) 875-125 MG per tablet 1 tablet        1 tablet Oral Every 12 hours 12/31/22 1641 01/07/23 2159   12/31/22 1400  neomycin (MYCIFRADIN) tablet 1,000 mg  Status:  Discontinued       Placed in "And" Linked Group   1,000 mg Oral 3 times per day 12/31/22 0550 12/31/22 0605   12/31/22 1400  metroNIDAZOLE (FLAGYL) tablet  1,000 mg  Status:  Discontinued       Placed in "And" Linked Group   1,000 mg Oral 3 times per day 12/31/22 0550 12/31/22 0605   12/31/22 0600  cefoTEtan (CEFOTAN) 2 g in sodium chloride 0.9 % 100 mL IVPB        2 g 200 mL/hr over 30 Minutes Intravenous On call to O.R. 12/31/22 0550 12/31/22 1834       Assessment/Plan: s/p Procedure(s): XI ROBOTIC ASSISTED COLOSTOMY TAKEDOWN (N/A) LYSIS OF ADHESION (N/A) FLEXIBLE SIGMOIDOSCOPY (N/A) CYSTOSCOPY with FIREFLY INJECTION (N/A)  - Doing great - she is comfortable with and stable for discharge home today. Reviewed expectations, wound care plans, diet, activity, and plans. All questions answered to her satisfaction   LOS: 3 days    Andria Meuse  MD  01/03/2023

## 2023-01-03 NOTE — Progress Notes (Signed)
   01/03/23 0852  TOC Brief Assessment  Insurance and Status Reviewed  Patient has primary care physician Yes  Home environment has been reviewed Resides at home with spouse  Prior level of function: Independent at baseline  Prior/Current Home Services No current home services  Social Determinants of Health Reivew SDOH reviewed no interventions necessary  Readmission risk has been reviewed Yes  Transition of care needs no transition of care needs at this time

## 2023-01-04 LAB — SURGICAL PATHOLOGY

## 2023-01-04 NOTE — Telephone Encounter (Signed)
LVM to call back to discuss pt message

## 2023-01-04 NOTE — Discharge Summary (Addendum)
Patient ID: Tracy Estrada MRN: [id number] DOB/AGE: 74-Apr-1952 74 y.o.  Admit date: 12/31/2022 Discharge date: 01/03/2023  Discharge Diagnoses Patient Active Problem List   Diagnosis Date Noted   S/P colostomy takedown 12/31/2022   Vaginitis 12/29/2022   Absence of bladder continence 11/09/2022   Irritant contact dermatitis associated with fecal stoma 07/06/2022   Slow transit constipation 07/06/2022   Colostomy complication (HCC) 07/06/2022   Foot pain, left 02/08/2022   Erythema of foot 02/08/2022   Memory changes 07/21/2021   Anemia 06/21/2021   Right pelvic adnexal fluid collection 06/21/2021   Colostomy status (HCC) 06/20/2021   Neutropenia (HCC) 01/28/2020   Chronic right shoulder pain 01/28/2020   Foul smelling urine 09/04/2019   Medicare annual wellness visit, subsequent 01/25/2019   Hyperlipidemia 01/25/2019   Osteoporosis 01/25/2019   Rash and nonspecific skin eruption 04/13/2018   Fibromyalgia 09/16/2015   Anxiety and depression 09/16/2015   Insomnia 09/16/2015   Genetic testing 03/21/2015   Family history of breast cancer in sister 02/27/2015   Family history of ovarian cancer 02/27/2015   Family history of colon cancer 02/27/2015    Consultants None  Procedures OR 12/31/22 Robotic assisted takedown of end colostomy with colorectal anastomosis Robotic lysis of adhesions x 100 minutes Flexible sigmoidoscopy Bilateral transversus abdominus plane (TAP) blocks  FINDINGS: Adhesions of omentum across the midline in the lower half.  Small bowel containing adhesions throughout the left lower quadrant and into the pelvis, necessitating adhesiolysis to facilitate ostomy reversal.  Healthy rectal stump with no evident residual sigmoid colon.  Healthy descending colon going out to the colostomy.  A well perfused, tension free, hemostatic, air tight 29 mm EEA colorectal anastomosis fashioned 11 cm from the anal verge by flexible sigmoidoscopy.   Of note, she does have a  somewhat medialized right ureter.   Hospital Course: She was admitted postoperatively and recovered well.  Her diet was gradually advanced which she tolerated.  She began having spontaneous bowel function.  Her packing was removed.  On 01/03/2023, she is comfortable with and stable for discharge home.  Postoperative expectations were discussed with her prior to discharge.  All of her questions were answered.  Follow-up in my office has been arranged.    Allergies as of 01/03/2023       Reactions   Demerol [meperidine] Nausea And Vomiting   Kenalog [triamcinolone Acetonide] Other (See Comments)   Cause vertigo/dizziness   Adhesive [tape] Rash, Other (See Comments)   PAPER TAPE OK        Medication List     STOP taking these medications    cephALEXin 500 MG capsule Commonly known as: KEFLEX       TAKE these medications    acetaminophen 325 MG tablet Commonly known as: TYLENOL Take 650 mg by mouth every 6 (six) hours as needed for moderate pain or headache.   amoxicillin-clavulanate 875-125 MG tablet Commonly known as: AUGMENTIN Take 1 tablet by mouth every 12 (twelve) hours for 6 days.   buPROPion 150 MG 24 hr tablet Commonly known as: WELLBUTRIN XL Take 2 tablets (300 mg total) by mouth daily. For depression.   cyanocobalamin 1000 MCG tablet Commonly known as: VITAMIN B12 Take 1,000 mcg by mouth daily.   fluticasone 50 MCG/ACT nasal spray Commonly known as: FLONASE Place 2 sprays into the nose daily as needed for allergies.   ibuprofen 200 MG tablet Commonly known as: ADVIL Take 200-400 mg by mouth every 6 (six) hours as needed for moderate pain.  multivitamin with minerals tablet Take 1 tablet by mouth daily. Centrum Silver for Women   Potassium 99 MG Tabs Take 99 mg by mouth daily.   pravastatin 40 MG tablet Commonly known as: PRAVACHOL TAKE 1 TABLET BY MOUTH EVERY DAY FOR CHOLESTEROL   traMADol 50 MG tablet Commonly known as: ULTRAM Take 1 tablet  (50 mg total) by mouth every 6 (six) hours as needed for up to 5 days (postop pain not controlled with ibuprofen first).   VITAMIN D3 PO Take 1 tablet by mouth daily.   Voltaren 1 % Gel Generic drug: diclofenac Sodium Apply 2 g topically 2 (two) times daily as needed (pain).          Follow-up Information     Andria Meuse, MD Follow up on 01/17/2023.   Specialties: General Surgery, Colon and Rectal Surgery Why: Please arrive by 10:05 am Contact information: [address] [place] [address] [phone]                 Stephanie Coup. Cliffton Asters, M.D. [facility]

## 2023-01-05 NOTE — Telephone Encounter (Signed)
Patient states she was just released from the hospital.  Patient states she received a message from [facility], CMA, and she is calling her back.  I read message from Daijah.  Patient states the amoxicillin-clavulanate (AUGMENTIN) 875-125 MG tablet upset her stomach and she would like to see if we can prescribe something she has taken in past.  Patient states she was taking sodium chloride for seven days, three times per day, she believes.  Patient states she saw Amy Bledsoe just after Labor Day this year and she prescribed medication that worked for her.  Patient states she would like to know if we can prescribe this for her or if she needs to see her PCP.  Patient states she will contact her PCP to schedule her hospital follow-up.

## 2023-01-06 ENCOUNTER — Other Ambulatory Visit: Payer: Self-pay | Admitting: Family

## 2023-01-06 ENCOUNTER — Telehealth: Payer: Self-pay | Admitting: Family

## 2023-01-06 DIAGNOSIS — N390 Urinary tract infection, site not specified: Secondary | ICD-10-CM

## 2023-01-06 MED ORDER — CEFDINIR 300 MG PO CAPS: 300.0000 mg | ORAL_CAPSULE | Freq: Two times a day (BID) | ORAL | 0 refills | Status: AC

## 2023-01-06 NOTE — Progress Notes (Signed)
See telephone note.

## 2023-01-06 NOTE — Telephone Encounter (Signed)
Spoke to pt she stated that she has already spoken to [place] regarding her message

## 2023-01-06 NOTE — Telephone Encounter (Signed)
Called and spoke with patient.  She is doing well after surgery 12/31/22 Diarrhea has improved since stopping Augmentin.  Denies fever, dysuria.  Reviewed chart and patient was treated with IV cefotan 2g  Discussed urine culture which was not reportable to cefazolin.  Advised Keflex would not be appropriate which she was previously treated on 11/12/2022.  Discussed third-generation cephalosporin, Omnicef.  I have sent in Omnicef.  Patient will continue probiotics.  She will call [facility] office to schedule repeat urinalysis to ensure blood has resolved in 3 weeks time.

## 2023-01-07 ENCOUNTER — Telehealth: Payer: Self-pay

## 2023-01-07 NOTE — Telephone Encounter (Signed)
Spoke with pt. Pt denied any uti symptoms. Stated she would stop taking antibiotic. Pt stated she was eating yogurt and wasn't eating much due to diarrhea. Advise pt if symptoms didn't subside she should seek care from urgent care. Mychart msg sent to pt which included recommendations from [facility] for pt to refer back to.

## 2023-01-12 ENCOUNTER — Encounter: Payer: Self-pay | Admitting: Primary Care

## 2023-01-12 ENCOUNTER — Ambulatory Visit (INDEPENDENT_AMBULATORY_CARE_PROVIDER_SITE_OTHER): Payer: Medicare Other | Admitting: Primary Care

## 2023-01-12 ENCOUNTER — Other Ambulatory Visit: Payer: Self-pay | Admitting: Primary Care

## 2023-01-12 VITALS — BP 136/78 | HR 84 | Temp 98.8°F | Ht 60.0 in | Wt 107.0 lb

## 2023-01-12 DIAGNOSIS — R197 Diarrhea, unspecified: Secondary | ICD-10-CM | POA: Insufficient documentation

## 2023-01-12 DIAGNOSIS — Z9889 Other specified postprocedural states: Secondary | ICD-10-CM | POA: Diagnosis not present

## 2023-01-12 DIAGNOSIS — N760 Acute vaginitis: Secondary | ICD-10-CM

## 2023-01-12 LAB — CBC WITH DIFFERENTIAL/PLATELET
Basophils Absolute: 0 10*3/uL (ref 0.0–0.1)
Basophils Relative: 0.3 % (ref 0.0–3.0)
Eosinophils Absolute: 0 10*3/uL (ref 0.0–0.7)
Eosinophils Relative: 0.8 % (ref 0.0–5.0)
HCT: 39.3 % (ref 36.0–46.0)
Hemoglobin: 13.2 g/dL (ref 12.0–15.0)
Lymphocytes Relative: 37.5 % (ref 12.0–46.0)
Lymphs Abs: 1.4 10*3/uL (ref 0.7–4.0)
MCHC: 33.5 g/dL (ref 30.0–36.0)
MCV: 99.1 fL (ref 78.0–100.0)
Monocytes Absolute: 0.2 10*3/uL (ref 0.1–1.0)
Monocytes Relative: 4.8 % (ref 3.0–12.0)
Neutro Abs: 2.1 10*3/uL (ref 1.4–7.7)
Neutrophils Relative %: 56.6 % (ref 43.0–77.0)
Platelets: 298 10*3/uL (ref 150.0–400.0)
RBC: 3.97 Mil/uL (ref 3.87–5.11)
RDW: 13 % (ref 11.5–15.5)
WBC: 3.7 10*3/uL — ABNORMAL LOW (ref 4.0–10.5)

## 2023-01-12 LAB — BASIC METABOLIC PANEL
BUN: 19 mg/dL (ref 6–23)
CO2: 33 meq/L — ABNORMAL HIGH (ref 19–32)
Calcium: 10.7 mg/dL — ABNORMAL HIGH (ref 8.4–10.5)
Chloride: 102 meq/L (ref 96–112)
Creatinine, Ser: 0.68 mg/dL (ref 0.40–1.20)
GFR: 85.73 mL/min (ref >60.00)
Glucose, Bld: 126 mg/dL — ABNORMAL HIGH (ref 70–99)
Potassium: 4 meq/L (ref 3.5–5.1)
Sodium: 141 meq/L (ref 135–145)

## 2023-01-12 NOTE — Assessment & Plan Note (Signed)
Differentials include side effects of antibiotics versus C. difficile versus other pathogen.  Fortunately, she lacks systemic symptoms. Stressed the importance of hydration with water and electrolytes.  BMP, CBC with diff, and stool studies pending.  She is stable for outpatient treatment. Await results.

## 2023-01-12 NOTE — Progress Notes (Signed)
Subjective:    Patient ID: Tracy Estrada, female    DOB: [birth year], 72 y.o.   MRN: [id number]  Diarrhea  Pertinent negatives include no abdominal pain, chills, fever or vomiting.    Tracy Estrada is a very pleasant 72 y.o. female with a medical history of neutropenia, constipation, hyperlipidemia, diverticulitis requiring colostomy, colostomy status post takedown, bladder incontinence, who presents today to discuss diarrhea.  Evaluated by Claris Che, NP on 12/29/2022 for vaginitis, treated with fluconazole 150 mg x 1 dose.  Urine culture returned positive for Proteus Mirabilis, treated with Augmentin course twice daily x 7 days.  She underwent colostomy takedown on 12/31/2022 per Dr. Cliffton Asters.  A few days later she developed recurrent diarrhea.  Augmentin was discontinued and she was initiated on cefdinir 300 mg twice daily x 7 days.    Her diarrhea has continued despite discontinuation of Augmentin. She continues with diarrhea on cefdinir.  She is experiencing numerous liquid, brown episodes of diarrhea daily, >10 episodes per day. She is hydrating throughout the day with milk, sweet tea, some water. She's working to increase her appetite which has been declined since her diarrhea.   She denies bloody stools, dark stools, abdominal pain outside of her recent take down, fevers, chills, nausea, vomiting, dysuria, vaginal itching, vaginal discharge.  She has not been to for C. difficile or other stool pathogens.   Review of Systems  Constitutional:  Negative for chills and fever.  Gastrointestinal:  Positive for diarrhea. Negative for abdominal pain, blood in stool and vomiting.  Neurological:  Negative for dizziness.         Past Medical History:  Diagnosis Date   Achilles bursitis or tendinitis    Anxiety state, unspecified    Arthritis    Bilateral hydronephrosis 12/12/2021   Closed fracture of unspecified part of fibula    Depressive disorder, not elsewhere classified     Diverticulosis of colon (without mention of hemorrhage)    Edema    Fibromyalgia    Insomnia, unspecified    Intra-abdominal abscess (HCC) 06/21/2021   Lumbago    Mass of left ovary 06/01/2016   Meniere's disease 1986   MVP (mitral valve prolapse)    Other intra-abdominal and pelvic swelling, mass and lump 06/01/2016   Pain in joint, site unspecified    Peptic ulcer    no testing   Pure hypercholesterolemia    TSH elevation    Urinary tract infection 12/12/2021    Social History   Socioeconomic History   Marital status: Married    Spouse name: Magie Ciampa   Number of children: 2   Years of education: Not on file   Highest education level: 12th grade  Occupational History   Occupation: retired    Comment: Diplomatic Services operational officer  Tobacco Use   Smoking status: Never    Passive exposure: Never   Smokeless tobacco: Never  Vaping Use   Vaping status: Never Used  Substance and Sexual Activity   Alcohol use: Not Currently    Comment: 2-3 beers per week or wine   Drug use: No   Sexual activity: Yes    Birth control/protection: Surgical, Post-menopausal  Other Topics Concern   Not on file  Social History Narrative   Marital status:  Married x 14 years; second marriage;happily married,husband has PTSD.     Children: 2 daughters Selena Batten, Tresa Endo); 7 grandchildren; 2 gg      Lives: with husband, dog/beagle      Employment: retired since 2013; Diplomatic Services operational officer  Tobacco: never      Alcohol: weekends; 1-2 glasses of wine per week or beer.      Drugs:  None      Exercise: none in 2018      Seatbelt: 100%      Guns: unloaded.     Caffeine not every day. 7 grandchildren and 2 GG. Organ donor NO.      ADLs: independent with ADLs; drives      Advanced Directives: YES; FULL CODE no prolonged measures.   Social Determinants of Health   Financial Resource Strain: Low Risk  (11/09/2022)   Overall Financial Resource Strain (CARDIA)    Difficulty of Paying Living Expenses: Not hard at all  Food  Insecurity: No Food Insecurity (12/31/2022)   Hunger Vital Sign    Worried About Running Out of Food in the Last Year: Never true    Ran Out of Food in the Last Year: Never true  Transportation Needs: No Transportation Needs (12/31/2022)   PRAPARE - Administrator, Civil Service (Medical): No    Lack of Transportation (Non-Medical): No  Physical Activity: Insufficiently Active (11/09/2022)   Exercise Vital Sign    Days of Exercise per Week: 2 days    Minutes of Exercise per Session: 10 min  Stress: No Stress Concern Present (11/09/2022)   [employer] of Occupational Health - Occupational Stress Questionnaire    Feeling of Stress : Only a little  Social Connections: Socially Integrated (11/09/2022)   Social Connection and Isolation Panel [NHANES]    Frequency of Communication with Friends and Family: More than three times a week    Frequency of Social Gatherings with Friends and Family: More than three times a week    Attends Religious Services: More than 4 times per year    Active Member of [employer] or Organizations: Yes    Attends Engineer, structural: More than 4 times per year    Marital Status: Married  Catering manager Violence: Not At Risk (12/31/2022)   Humiliation, Afraid, Rape, and Kick questionnaire    Fear of Current or Ex-Partner: No    Emotionally Abused: No    Physically Abused: No    Sexually Abused: No    Past Surgical History:  Procedure Laterality Date   ABDOMINAL HYSTERECTOMY  03/08/1980   fibroids; ovaries intact.   BACK SURGERY     x2 Cervical and lumbar   BOWEL RESECTION N/A 12/22/2021   Procedure: SIGMOID COLECTOMY AND COLOSTOMY (HARTMAN'S PROCEDURE);  Surgeon: Abigail Miyamoto, MD;  Location: [facility];  Service: General;  Laterality: N/A;   COLONOSCOPY  2017   CYSTOSCOPY N/A 06/08/2016   Procedure: Bluford Kaufmann;  Surgeon: Conard Novak, MD;  Location: [facility];  Service: Gynecology;  Laterality: N/A;   FLEXIBLE SIGMOIDOSCOPY N/A  12/31/2022   Procedure: FLEXIBLE SIGMOIDOSCOPY;  Surgeon: Andria Meuse, MD;  Location: [facility];  Service: General;  Laterality: N/A;   IR CATHETER TUBE CHANGE  10/21/2021   IR CATHETER TUBE CHANGE  12/15/2021   IR RADIOLOGIST EVAL & MGMT  07/10/2021   IR RADIOLOGIST EVAL & MGMT  08/11/2021   IR RADIOLOGIST EVAL & MGMT  09/09/2021   IR RADIOLOGIST EVAL & MGMT  10/09/2021   IR RADIOLOGIST EVAL & MGMT  11/02/2021   IR RADIOLOGIST EVAL & MGMT  11/24/2021   LAPAROSCOPIC LYSIS OF ADHESIONS  06/08/2016   Procedure: LAPAROSCOPIC LYSIS OF ADHESIONS;  Surgeon: Conard Novak, MD;  Location: [facility];  Service: Gynecology;;  LAPAROSCOPIC SALPINGO OOPHERECTOMY Left 06/08/2016   Procedure: LAPAROSCOPIC SALPINGO OOPHORECTOMY;  Surgeon: Conard Novak, MD;  Location: [facility];  Service: Gynecology;  Laterality: Left;   LYSIS OF ADHESION N/A 12/31/2022   Procedure: LYSIS OF ADHESION;  Surgeon: Andria Meuse, MD;  Location: [facility];  Service: General;  Laterality: N/A;   OOPHORECTOMY     left ovary   SALPINGECTOMY     left   SPINE SURGERY     lumbar spine 11/2015; cervical spine 2009. Elsner.   TONSILLECTOMY     XI ROBOTIC ASSISTED COLOSTOMY TAKEDOWN N/A 12/31/2022   Procedure: XI ROBOTIC ASSISTED COLOSTOMY TAKEDOWN;  Surgeon: Andria Meuse, MD;  Location: [facility];  Service: General;  Laterality: N/A;    Family History  Problem Relation Age of Onset   Hyperlipidemia Mother    Hypertension Mother    Colon polyps Mother    Coronary artery disease Father         AMI age 42   Heart disease Father 70       AMI x 2; first AMI in 14s   Breast cancer Sister 3       with metastasis to bone; smoker   Cancer Sister 66       breast cancer   Colon cancer Maternal Uncle        dx. >50   Colon cancer Paternal Aunt        dx. 80s   Heart attack Paternal Uncle    Stroke Maternal Grandmother    Alzheimer's disease Paternal Grandmother    Heart disease Paternal Grandfather     Heart attack Paternal Grandfather    Healthy Daughter    Healthy Daughter    Breast cancer Cousin    Ovarian cancer Cousin        dx. late 70s/dx. 60s   Lung cancer Cousin        dx. 70s   Stomach cancer Other    Esophageal cancer Neg Hx    Rectal cancer Neg Hx     Allergies  Allergen Reactions   Demerol [Meperidine] Nausea And Vomiting   Kenalog [Triamcinolone Acetonide] Other (See Comments)    Cause vertigo/dizziness   Adhesive [Tape] Rash and Other (See Comments)    PAPER TAPE OK    Current Outpatient Medications on File Prior to Visit  Medication Sig Dispense Refill   acetaminophen (TYLENOL) 325 MG tablet Take 650 mg by mouth every 6 (six) hours as needed for moderate pain or headache.     buPROPion (WELLBUTRIN XL) 150 MG 24 hr tablet Take 2 tablets (300 mg total) by mouth daily. For depression. 180 tablet 3   Cholecalciferol (VITAMIN D3 PO) Take 1 tablet by mouth daily.     diclofenac Sodium (VOLTAREN) 1 % GEL Apply 2 g topically 2 (two) times daily as needed (pain).     fluticasone (FLONASE) 50 MCG/ACT nasal spray Place 2 sprays into the nose daily as needed for allergies.      ibuprofen (ADVIL) 200 MG tablet Take 200-400 mg by mouth every 6 (six) hours as needed for moderate pain.     Multiple Vitamins-Minerals (MULTIVITAMIN WITH MINERALS) tablet Take 1 tablet by mouth daily. Centrum Silver for Women     Potassium 99 MG TABS Take 99 mg by mouth daily.     pravastatin (PRAVACHOL) 40 MG tablet TAKE 1 TABLET BY MOUTH EVERY DAY FOR CHOLESTEROL 90 tablet 2   vitamin B-12 (CYANOCOBALAMIN) 1000 MCG tablet Take 1,000  mcg by mouth daily.     cefdinir (OMNICEF) 300 MG capsule Take 1 capsule (300 mg total) by mouth 2 (two) times daily for 7 days. (Patient not taking: Reported on 01/12/2023) 14 capsule 0   No current facility-administered medications on file prior to visit.    BP 136/78   Pulse 84   Temp 98.8 F (37.1 C) (Temporal)   Ht 5' (1.524 m)   Wt 107 lb (48.5 kg)   SpO2  98%   BMI 20.90 kg/m  Objective:   Physical Exam Cardiovascular:     Rate and Rhythm: Normal rate and regular rhythm.  Pulmonary:     Effort: Pulmonary effort is normal.     Breath sounds: Normal breath sounds.  Abdominal:     General: Bowel sounds are normal.     Palpations: Abdomen is soft.  Musculoskeletal:     Cervical back: Neck supple.  Skin:    General: Skin is warm and dry.  Neurological:     Mental Status: She is alert and oriented to person, place, and time.  Psychiatric:        Mood and Affect: Mood normal.           Assessment & Plan:  Acute diarrhea Assessment & Plan: Differentials include side effects of antibiotics versus C. difficile versus other pathogen.  Fortunately, she lacks systemic symptoms. Stressed the importance of hydration with water and electrolytes.  BMP, CBC with diff, and stool studies pending.  She is stable for outpatient treatment. Await results.  Orders: -     Basic metabolic panel -     C. difficile GDH and Toxin A/B; Future -     Gastrointestinal Pathogen Pnl RT, PCR; Future -     Giardia antigen; Future -     CBC with Differential/Platelet  Acute vaginitis Assessment & Plan: Resolved.   S/P colostomy takedown Assessment & Plan: With recent surgery on 12/31/2022. Reviewed surgical notes from 12/31/2022 per surgery through [facility].  Abdominal exam today stable.         Doreene Nest, NP

## 2023-01-12 NOTE — Assessment & Plan Note (Signed)
Resolved

## 2023-01-12 NOTE — Patient Instructions (Signed)
Stop by the lab prior to leaving today. I will notify you of your results once received.   Return the stool specimens a soon as possible.  Increase intake of water and Pedialyte or Gatorade.  It was a pleasure to see you today!

## 2023-01-12 NOTE — Assessment & Plan Note (Signed)
With recent surgery on 12/31/2022. Reviewed surgical notes from 12/31/2022 per surgery through [facility].  Abdominal exam today stable.

## 2023-01-13 ENCOUNTER — Other Ambulatory Visit: Payer: Medicare Other

## 2023-01-13 DIAGNOSIS — R197 Diarrhea, unspecified: Secondary | ICD-10-CM | POA: Diagnosis not present

## 2023-01-14 LAB — C. DIFFICILE GDH AND TOXIN A/B
GDH ANTIGEN: NOT DETECTED
MICRO NUMBER:: 15700231
SPECIMEN QUALITY:: ADEQUATE
TOXIN A AND B: NOT DETECTED

## 2023-01-14 LAB — PARATHYROID HORMONE, INTACT (NO CA): PTH: 52 pg/mL (ref 16–77)

## 2023-01-17 ENCOUNTER — Other Ambulatory Visit: Payer: Self-pay | Admitting: Primary Care

## 2023-01-17 LAB — GASTROINTESTINAL PATHOGEN PNL
CampyloBacter Group: NOT DETECTED
Norovirus GI/GII: NOT DETECTED
Rotavirus A: NOT DETECTED
Salmonella species: NOT DETECTED
Shiga Toxin 1: NOT DETECTED
Shiga Toxin 2: NOT DETECTED
Shigella Species: NOT DETECTED
Vibrio Group: NOT DETECTED
Yersinia enterocolitica: NOT DETECTED

## 2023-01-17 LAB — GIARDIA ANTIGEN
MICRO NUMBER:: 15700226
RESULT:: NOT DETECTED
SPECIMEN QUALITY:: ADEQUATE

## 2023-01-18 ENCOUNTER — Ambulatory Visit: Payer: Medicare Other | Admitting: Primary Care

## 2023-01-19 ENCOUNTER — Inpatient Hospital Stay: Payer: Medicare Other | Admitting: Primary Care

## 2023-01-21 ENCOUNTER — Other Ambulatory Visit (INDEPENDENT_AMBULATORY_CARE_PROVIDER_SITE_OTHER): Payer: Medicare Other

## 2023-01-21 ENCOUNTER — Ambulatory Visit (INDEPENDENT_AMBULATORY_CARE_PROVIDER_SITE_OTHER): Payer: Medicare Other

## 2023-01-21 DIAGNOSIS — Z23 Encounter for immunization: Secondary | ICD-10-CM | POA: Diagnosis not present

## 2023-01-21 LAB — VITAMIN D 25 HYDROXY (VIT D DEFICIENCY, FRACTURES): VITD: 22.72 ng/mL — ABNORMAL LOW (ref 30.00–100.00)

## 2023-01-22 LAB — PTH, INTACT AND CALCIUM
Calcium: 10.4 mg/dL (ref 8.6–10.4)
PTH: 69 pg/mL (ref 16–77)

## 2023-01-26 ENCOUNTER — Other Ambulatory Visit: Payer: Medicare Other

## 2023-03-20 DIAGNOSIS — Z23 Encounter for immunization: Secondary | ICD-10-CM | POA: Diagnosis not present

## 2023-04-16 IMAGING — CT CT ABD-PELV W/ CM
2 of 4 series · 11 of 46 positions shown, 12 images · IV contrast (agent unspecified)
Comparison: 06/20/2021, 06/21/2021, 06/26/2021

CLINICAL DATA: 71-year-old female with history of perforated
diverticulitis complicated by abscess status post percutaneous drain
placement on 06/21/2021.

EXAM:
CT ABDOMEN AND PELVIS WITH CONTRAST
TECHNIQUE: Multidetector CT imaging of the abdomen and pelvis was performed
using the standard protocol following bolus administration of
intravenous contrast.

[Series 2: abd pelvis 5.00 br40 s3 axial · axial · 0.44mm/px · z∈[+1037,+1387]mm · 8 of 84 slices shown, 9 images]
[im 7/84  soft-tissue]
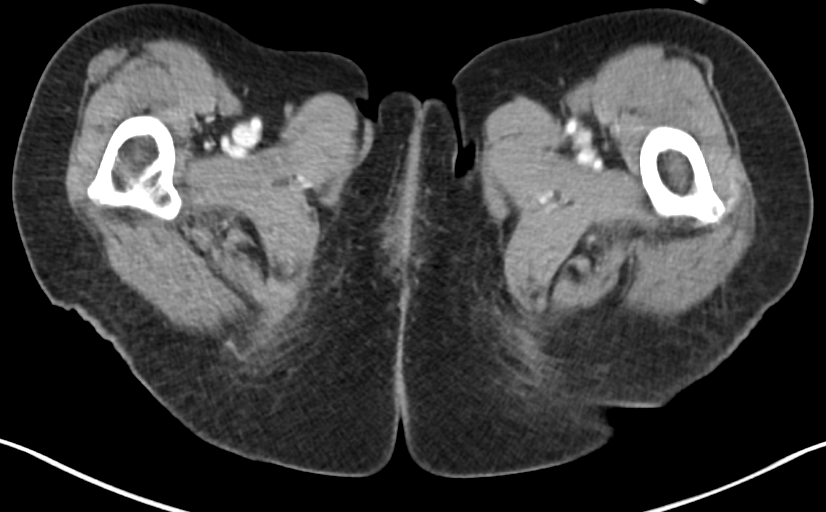
[im 7/84  bone]
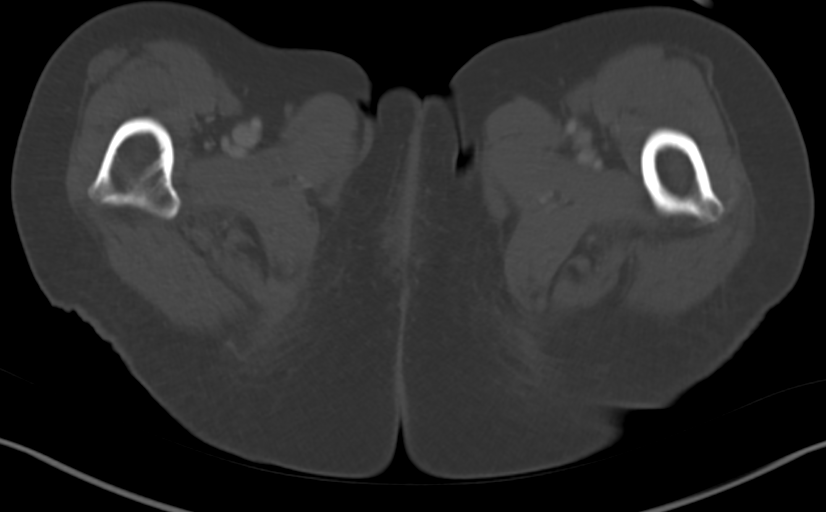
[im 17/84  soft-tissue]
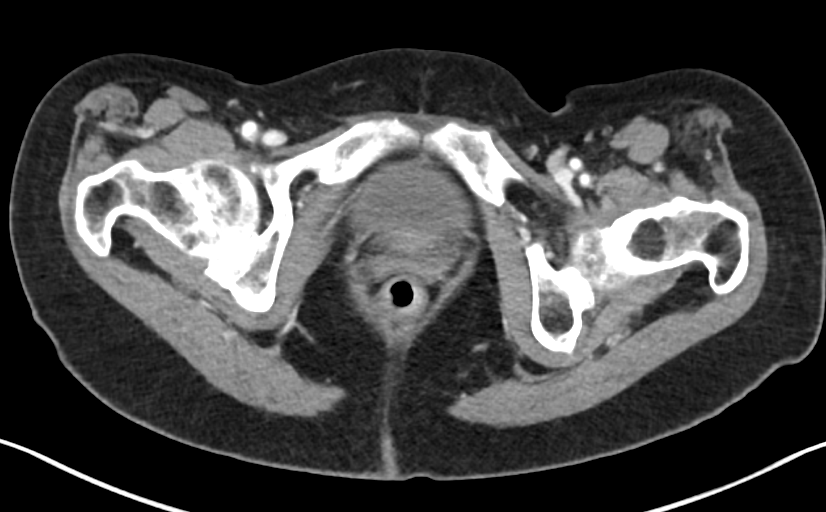
[im 27/84  soft-tissue]
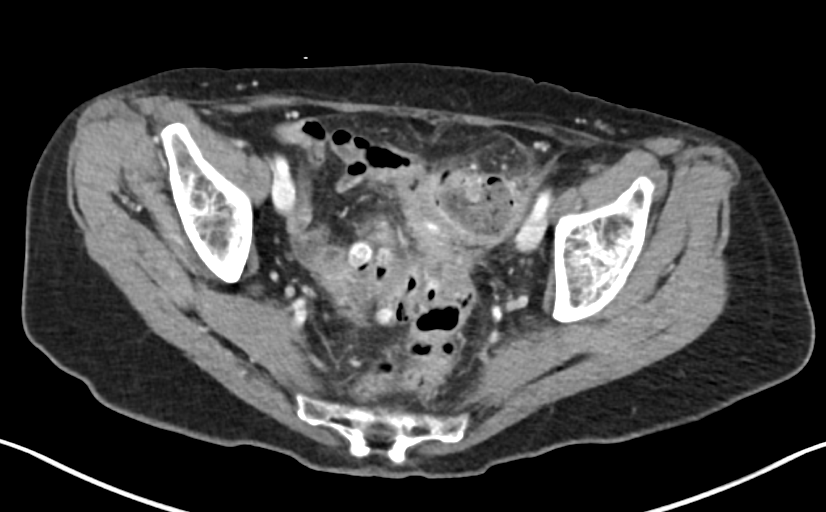
[im 37/84  soft-tissue]
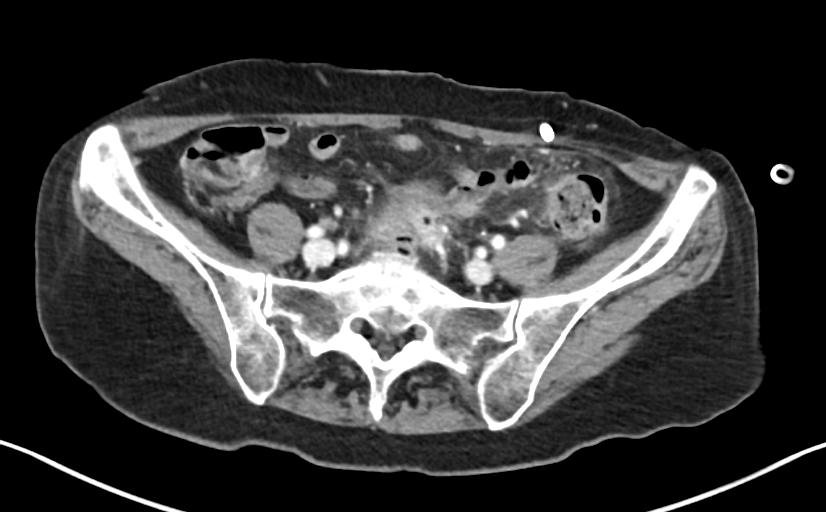
[im 47/84  soft-tissue]
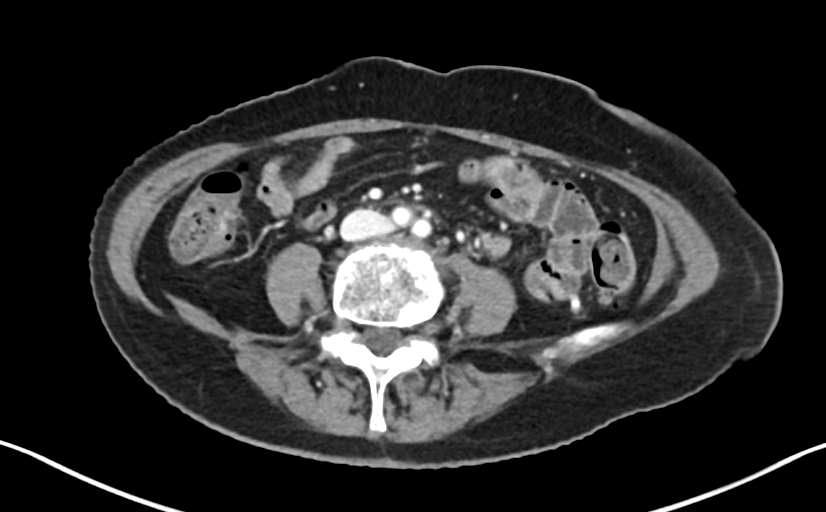
[im 57/84  soft-tissue]
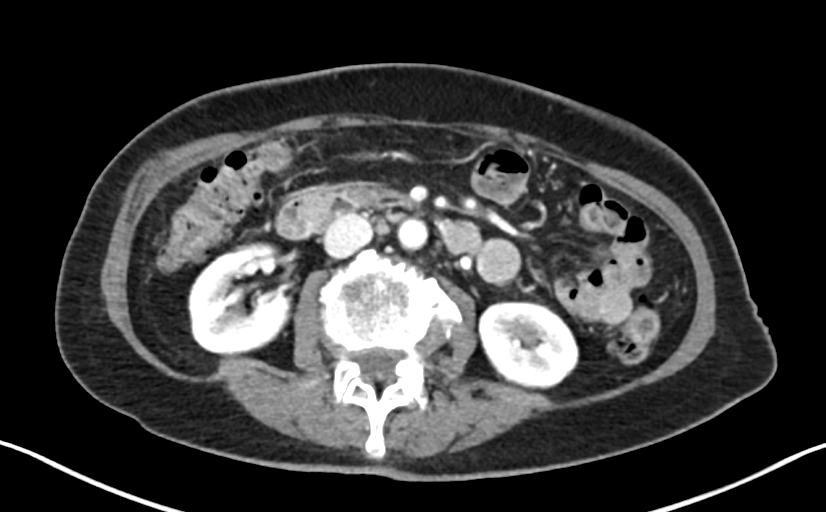
[im 67/84  soft-tissue]
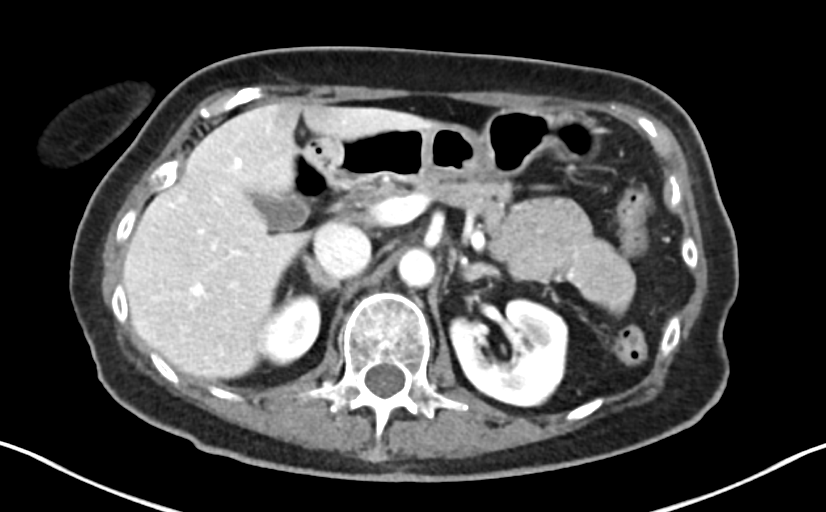
[im 77/84  soft-tissue]
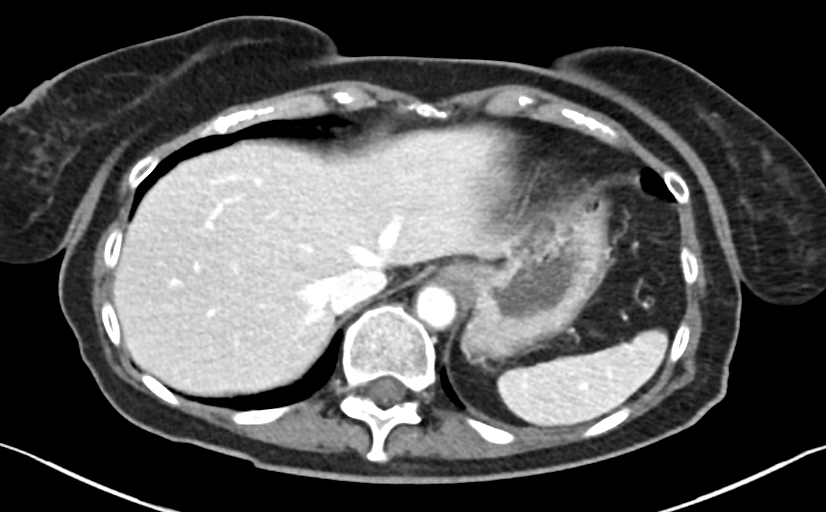

[Series 6: abd pelvis 2.00 br40 s3 cor · coronal · 0.71mm/px · 3 of 113 slices shown]
[im 38/113  soft-tissue]
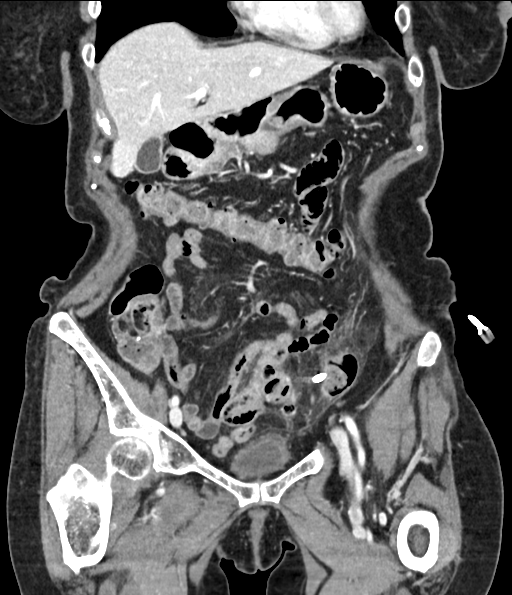
[im 50/113  soft-tissue]
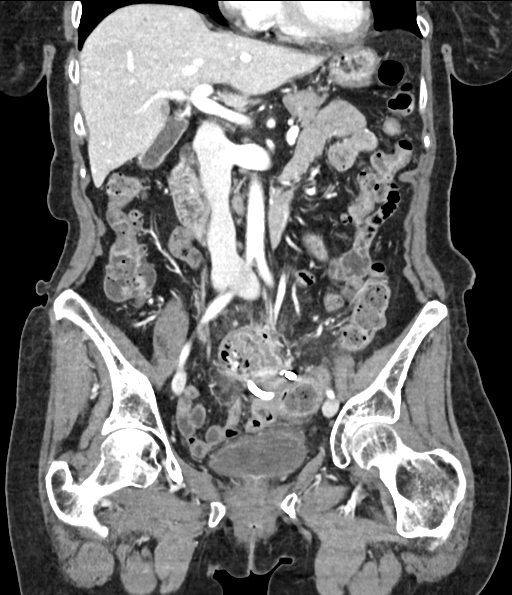
[im 63/113  soft-tissue]
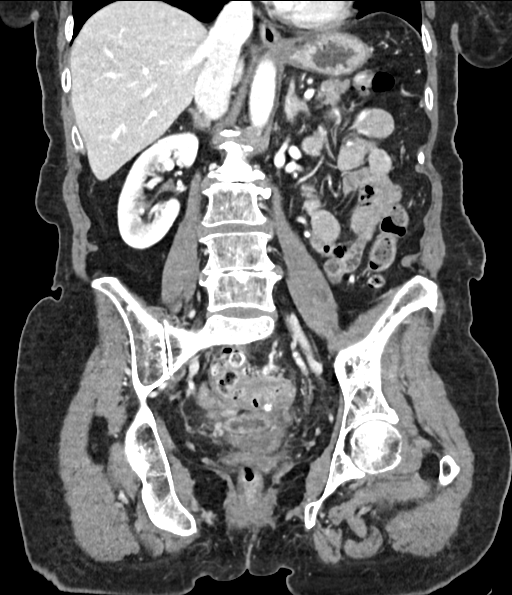

[11 of 46 positions shown; findings below may reference images not displayed]

RADIATION DOSE REDUCTION: This exam was performed according to the
departmental dose-optimization program which includes automated
exposure control, adjustment of the mA and/or kV according to
patient size and/or use of iterative reconstruction technique.

CONTRAST:  100mL [id number] IOPAMIDOL ([id number]) INJECTION 61%
FINDINGS: Lower chest: No acute abnormality.

Hepatobiliary: No focal liver abnormality is seen. No gallstones,
gallbladder wall thickening, or biliary dilatation.

Pancreas: Unremarkable. No pancreatic ductal dilatation or
surrounding inflammatory changes.

Spleen: Normal in size without focal abnormality.

Adrenals/Urinary Tract: Adrenal glands are unremarkable. Kidneys are
normal, without renal calculi, focal lesion, or hydronephrosis.
Bladder is unremarkable.

Stomach/Bowel: Stomach is within normal limits. Appendix is not
definitively identified. Again seen are colonic diverticula, most
prominent at the sigmoid colon where there is persistent the
slightly improved moderate mural thickening and mild surrounding fat
stranding. No evidence of bowel wall thickening, distention, or
inflammatory changes.

Vascular/Lymphatic: No significant vascular findings are present. No
enlarged abdominal or pelvic lymph nodes.

Reproductive: Status post hysterectomy. No adnexal masses.

Other: Indwelling left lower quadrant percutaneous drainage catheter
remains well positioned with near complete evacuation of the
previously visualized fluid collection. There is trace residual
simple appearing fluid just anterior to the rectosigmoid junction
measuring approximately 2.5 x 2.0 cm, previously 5.0 x 24.2 cm prior
to recent aspiration. No new fluid collections.

Musculoskeletal: Healed, nondisplaced left lateral tenth rib
fracture. No acute osseous abnormality.
IMPRESSION: 1. Near complete evacuation of previously visualized pericolonic
abscess with unchanged position of indwelling left lower quadrant
percutaneous drainage catheter.
2. Persistent but improving sigmoid diverticulitis.

## 2023-05-13 ENCOUNTER — Other Ambulatory Visit: Payer: Self-pay | Admitting: Primary Care

## 2023-05-13 DIAGNOSIS — Z1231 Encounter for screening mammogram for malignant neoplasm of breast: Secondary | ICD-10-CM

## 2023-06-01 ENCOUNTER — Ambulatory Visit
Admission: RE | Admit: 2023-06-01 | Discharge: 2023-06-01 | Disposition: A | Discharge status: Home / Self Care | Source: Ambulatory Visit | Attending: Primary Care | Admitting: Primary Care

## 2023-06-01 DIAGNOSIS — Z1231 Encounter for screening mammogram for malignant neoplasm of breast: Secondary | ICD-10-CM | POA: Insufficient documentation

## 2023-06-02 ENCOUNTER — Inpatient Hospital Stay
Admission: RE | Admit: 2023-06-02 | Discharge: 2023-06-02 | Disposition: A | Discharge status: Home / Self Care | Payer: Self-pay | Source: Ambulatory Visit | Attending: Primary Care | Admitting: Primary Care

## 2023-06-02 ENCOUNTER — Other Ambulatory Visit: Payer: Self-pay | Admitting: *Deleted

## 2023-06-02 ENCOUNTER — Inpatient Hospital Stay
Admission: RE | Admit: 2023-06-02 | Discharge: 2023-06-02 | Disposition: A | Discharge status: Home / Self Care | Payer: Self-pay | Source: Ambulatory Visit | Attending: Primary Care

## 2023-06-02 DIAGNOSIS — Z1231 Encounter for screening mammogram for malignant neoplasm of breast: Secondary | ICD-10-CM
